# Patient Record
Sex: Male | Born: 1945
Health system: Southern US, Community
[De-identification: ages and names within clinical notes are randomized; demographics above are authoritative.]

## PROBLEM LIST (undated history)

## (undated) DIAGNOSIS — Z800 Family history of malignant neoplasm of digestive organs: Secondary | ICD-10-CM

## (undated) DIAGNOSIS — J439 Emphysema, unspecified: Secondary | ICD-10-CM

## (undated) DIAGNOSIS — C20 Malignant neoplasm of rectum: Secondary | ICD-10-CM

## (undated) DIAGNOSIS — Z803 Family history of malignant neoplasm of breast: Secondary | ICD-10-CM

## (undated) DIAGNOSIS — I214 Non-ST elevation (NSTEMI) myocardial infarction: Secondary | ICD-10-CM

## (undated) DIAGNOSIS — Z87891 Personal history of nicotine dependence: Secondary | ICD-10-CM

## (undated) DIAGNOSIS — Z8616 Personal history of COVID-19: Secondary | ICD-10-CM

## (undated) DIAGNOSIS — I2510 Atherosclerotic heart disease of native coronary artery without angina pectoris: Secondary | ICD-10-CM

## (undated) HISTORY — DX: Non-ST elevation (NSTEMI) myocardial infarction: I21.4

## (undated) HISTORY — DX: Malignant neoplasm of rectum: C20

## (undated) HISTORY — DX: Emphysema, unspecified: J43.9

## (undated) HISTORY — DX: Personal history of nicotine dependence: Z87.891

## (undated) HISTORY — DX: Family history of malignant neoplasm of breast: Z80.3

## (undated) HISTORY — DX: Personal history of COVID-19: Z86.16

## (undated) HISTORY — DX: Atherosclerotic heart disease of native coronary artery without angina pectoris: I25.10

## (undated) HISTORY — PX: CYSTECTOMY: SUR359

## (undated) HISTORY — DX: Family history of malignant neoplasm of digestive organs: Z80.0

## (undated) MED FILL — Dexamethasone Sodium Phosphate Inj 100 MG/10ML: INTRAMUSCULAR | Qty: 1 | Status: AC

---

## 2003-12-07 ENCOUNTER — Ambulatory Visit: Payer: Self-pay | Admitting: Family Medicine

## 2004-05-11 ENCOUNTER — Ambulatory Visit: Payer: Self-pay | Admitting: Internal Medicine

## 2004-08-17 ENCOUNTER — Ambulatory Visit (HOSPITAL_COMMUNITY): Admission: RE | Admit: 2004-08-17 | Discharge: 2004-08-17 | Payer: Self-pay | Admitting: Surgery

## 2004-08-17 ENCOUNTER — Ambulatory Visit (HOSPITAL_BASED_OUTPATIENT_CLINIC_OR_DEPARTMENT_OTHER): Admission: RE | Admit: 2004-08-17 | Discharge: 2004-08-17 | Payer: Self-pay | Admitting: Surgery

## 2004-08-17 HISTORY — PX: INGUINAL HERNIA REPAIR: SUR1180

## 2012-07-30 ENCOUNTER — Encounter: Payer: Self-pay | Admitting: Family Medicine

## 2012-07-30 ENCOUNTER — Ambulatory Visit (INDEPENDENT_AMBULATORY_CARE_PROVIDER_SITE_OTHER): Payer: Medicare Other | Admitting: Family Medicine

## 2012-07-30 VITALS — BP 146/98 | HR 76 | Temp 97.6°F | Ht 69.0 in | Wt 168.0 lb

## 2012-07-30 DIAGNOSIS — Z800 Family history of malignant neoplasm of digestive organs: Secondary | ICD-10-CM

## 2012-07-30 DIAGNOSIS — L989 Disorder of the skin and subcutaneous tissue, unspecified: Secondary | ICD-10-CM

## 2012-07-30 DIAGNOSIS — Z1211 Encounter for screening for malignant neoplasm of colon: Secondary | ICD-10-CM

## 2012-07-30 DIAGNOSIS — R030 Elevated blood-pressure reading, without diagnosis of hypertension: Secondary | ICD-10-CM

## 2012-07-30 NOTE — Assessment & Plan Note (Signed)
Concern for basal cell - I did recommend eval by dermatology - have placed referral today.

## 2012-07-30 NOTE — Assessment & Plan Note (Signed)
Mother with colon cancer at age 69 - pt overdue for screening.   Does not desire to have colonoscopy - I have recommended we at least start with stool kit - provided today.

## 2012-07-30 NOTE — Progress Notes (Signed)
Subjective:    Patient ID: Albert Giles, male    DOB: 07/27/1945, 67 y.o.   MRN: [id number]  HPI CC: new pt to establish  Skin spot on right forearm - present for 8-9 months, not healing, not really changing. Not tender or pruritic.  So far hasn't tried any meds other than peroxide. Skin spot in right groin.  "mild COPD" - never dyspneic.  Quit smoking 14 years ago.  No h/o elevated blood pressure.  fmhx colon cancer - mother at age 42.  Preventative: Last CPE was 14 years ago Thinks tetanus 2009  Caffeine: 2 cups coffee, 2 cups tea/day Lives with wife Occupation: Surveyor, quantity, retired Edu: HS Activity: works in yard, walking Diet: some water, fruits/vegetables daily  Medications and allergies reviewed and updated in chart.  Past histories reviewed and updated if relevant as below. There are no active problems to display for this patient.  Past Medical History  Diagnosis Date  . Emphysema     mild  . Ex-smoker    Past Surgical History  Procedure Laterality Date  . Inguinal hernia repair Right 08/17/04  . Cystectomy      on back   History  Substance Use Topics  . Smoking status: Former Smoker    Quit date: 07/07/1998  . Smokeless tobacco: Never Used  . Alcohol Use: No   Family History  Problem Relation Age of Onset  . Cancer Mother 6    colon  . Cancer Sister     breast  . CAD Neg Hx   . Stroke Neg Hx   . Diabetes Neg Hx    No Known Allergies No current outpatient prescriptions on file prior to visit.   No current facility-administered medications on file prior to visit.     Review of Systems  Constitutional: Negative for fever, chills, activity change, appetite change, fatigue and unexpected weight change.  HENT: Negative for hearing loss and neck pain.   Eyes: Negative for visual disturbance.  Respiratory: Negative for cough, chest tightness, shortness of breath and wheezing.   Cardiovascular: Negative for chest pain, palpitations and leg  swelling.  Gastrointestinal: Negative for nausea, vomiting, abdominal pain, diarrhea, constipation, blood in stool and abdominal distention.  Genitourinary: Negative for hematuria and difficulty urinating.  Musculoskeletal: Negative for myalgias and arthralgias.  Skin: Negative for rash.  Neurological: Negative for dizziness, seizures, syncope and headaches.  Hematological: Negative for adenopathy. Does not bruise/bleed easily.  Psychiatric/Behavioral: Negative for dysphoric mood. The patient is not nervous/anxious.        Objective:   Physical Exam  Nursing note and vitals reviewed. Constitutional: He is oriented to person, place, and time. He appears well-developed and well-nourished. No distress.  HENT:  Head: Normocephalic and atraumatic.  Right Ear: Hearing, tympanic membrane, external ear and ear canal normal.  Left Ear: Hearing, tympanic membrane, external ear and ear canal normal.  Nose: Nose normal.  Mouth/Throat: Oropharynx is clear and moist. No oropharyngeal exudate.  Eyes: Conjunctivae and EOM are normal. Pupils are equal, round, and reactive to light. No scleral icterus.  Neck: Normal range of motion. Neck supple. No thyromegaly present.  Cardiovascular: Normal rate, regular rhythm, normal heart sounds and intact distal pulses.   No murmur heard. Pulses:      Radial pulses are 2+ on the right side, and 2+ on the left side.  Pulmonary/Chest: Effort normal and breath sounds normal. No respiratory distress. He has no wheezes. He has no rales.  Musculoskeletal: Normal range of  motion. He exhibits no edema.  Lymphadenopathy:    He has no cervical adenopathy.  Neurological: He is alert and oriented to person, place, and time.  CN grossly intact, station and gait intact  Skin: Skin is warm and dry. No rash noted.  R groin with SK R forearm with 1.3cm pearly nodule, some telangectasia and scaling  Psychiatric: He has a normal mood and affect. His behavior is normal. Judgment  and thought content normal.       Assessment & Plan:

## 2012-07-30 NOTE — Patient Instructions (Addendum)
Let's keep an eye on blood pressure - at local pharmacy - and let me know if consistently >140/90. Pass up front for referral to dermatology (skin doctor).  If we are unable to make appointment today, we will call you at [phone] (cell). Pass by lab for stool kit. Return in 6 months fasting for blood work, and afterwads for a physical.

## 2012-07-30 NOTE — Assessment & Plan Note (Signed)
Pt states no h/o this, but elevated today. Advised to monitor closely at home (local pharmacy once a week) and return to see me if consistently elevated for further discussion/evaluation.

## 2012-08-05 ENCOUNTER — Other Ambulatory Visit: Payer: Medicare Other

## 2012-08-05 DIAGNOSIS — Z1211 Encounter for screening for malignant neoplasm of colon: Secondary | ICD-10-CM

## 2012-08-07 ENCOUNTER — Encounter: Payer: Self-pay | Admitting: *Deleted

## 2012-09-10 ENCOUNTER — Encounter: Payer: Self-pay | Admitting: Family Medicine

## 2012-09-19 ENCOUNTER — Encounter: Payer: Self-pay | Admitting: Family Medicine

## 2013-11-26 ENCOUNTER — Telehealth (HOSPITAL_COMMUNITY): Payer: Self-pay | Admitting: *Deleted

## 2013-11-26 NOTE — Telephone Encounter (Signed)
Spoke to pt & reminded him about his due AWV including Colon Ca screening--need an appt. made

## 2013-11-29 NOTE — Telephone Encounter (Signed)
Pt scheduled 12/27/13 with same day labs. Pt wanted to inform us he is switching to [facility] for 2016

## 2013-11-29 NOTE — Telephone Encounter (Signed)
Ebony Hail, Can you please call the pt and get him in for his Medicare Annual Wellness Visit before 01/13/14. Thank you, Vincente Liberty

## 2013-12-27 ENCOUNTER — Ambulatory Visit (INDEPENDENT_AMBULATORY_CARE_PROVIDER_SITE_OTHER): Payer: Medicare Other | Admitting: Family Medicine

## 2013-12-27 ENCOUNTER — Encounter: Payer: Self-pay | Admitting: Family Medicine

## 2013-12-27 VITALS — BP 128/84 | HR 84 | Temp 98.2°F | Ht 68.25 in | Wt 174.0 lb

## 2013-12-27 DIAGNOSIS — Z0000 Encounter for general adult medical examination without abnormal findings: Secondary | ICD-10-CM | POA: Insufficient documentation

## 2013-12-27 DIAGNOSIS — Z23 Encounter for immunization: Secondary | ICD-10-CM

## 2013-12-27 DIAGNOSIS — Z7189 Other specified counseling: Secondary | ICD-10-CM | POA: Insufficient documentation

## 2013-12-27 DIAGNOSIS — Z800 Family history of malignant neoplasm of digestive organs: Secondary | ICD-10-CM

## 2013-12-27 NOTE — Progress Notes (Signed)
BP 128/84 mmHg  Pulse 84  Temp(Src) 98.2 F (36.8 C) (Oral)  Ht 5' 8.25" (1.734 m)  Wt 174 lb (78.926 kg)  BMI 26.25 kg/m2   CC: medicare wellness visit  Subjective:    Patient ID: Albert Giles, male    DOB: 02-09-1945, 68 y.o.   MRN: [id number]  HPI: Albert Giles is a 68 y.o. male presenting on 12/27/2013 for Annual Exam   fmhx colon cancer - mother at age 42.  Vision screen passed Hearing screen passed No falls No mood issues/depression/anhedonia.   Preventative: Colon cancer screening - discussed, would like iFOB even though has known fmhx. Prostate cancer screening - discussed, decided not to screen.  Flu - declines Pneumonia shot - hesitantly agrees to prevnar Thinks tetanus 2009 zostavax - declines Advanced directives - doesn't have this set up. Would want wife then 2 daughters. Packet provided today.  Caffeine: 2 cups coffee, 2 cups tea/day Lives with wife Occupation: Higher education careers adviser, retired Edu: HS Activity: works in yard, walking Diet: some water, fruits/vegetables daily  Caffeine: 2 cups coffee, 2 cups tea/day Lives with wife Occupation: Higher education careers adviser, retired Edu: HS Activity: works in yard, walking Diet: some water, fruits/vegetables daily  Relevant past medical, surgical, family and social history reviewed and updated as indicated. Interim medical history since our last visit reviewed. Allergies and medications reviewed and updated.  No current outpatient prescriptions on file prior to visit.   No current facility-administered medications on file prior to visit.    Review of Systems  Constitutional: Negative for fever, chills, activity change, appetite change, fatigue and unexpected weight change.  HENT: Negative for hearing loss.   Eyes: Negative for visual disturbance.  Respiratory: Negative for cough, chest tightness, shortness of breath and wheezing.   Cardiovascular: Negative for chest pain, palpitations and leg swelling.    Gastrointestinal: Negative for nausea, vomiting, abdominal pain, diarrhea, constipation, blood in stool and abdominal distention.  Genitourinary: Negative for hematuria and difficulty urinating.  Musculoskeletal: Negative for myalgias, arthralgias and neck pain.  Skin: Negative for rash.  Neurological: Negative for dizziness, seizures, syncope and headaches.  Hematological: Negative for adenopathy. Does not bruise/bleed easily.  Psychiatric/Behavioral: Negative for dysphoric mood. The patient is not nervous/anxious.    Per HPI unless specifically indicated above     Objective:    BP 128/84 mmHg  Pulse 84  Temp(Src) 98.2 F (36.8 C) (Oral)  Ht 5' 8.25" (1.734 m)  Wt 174 lb (78.926 kg)  BMI 26.25 kg/m2  Wt Readings from Last 3 Encounters:  12/27/13 174 lb (78.926 kg)  07/30/12 168 lb (76.204 kg)    Physical Exam  Constitutional: He is oriented to person, place, and time. He appears well-developed and well-nourished. No distress.  HENT:  Head: Normocephalic and atraumatic.  Right Ear: Hearing, tympanic membrane, external ear and ear canal normal.  Left Ear: Hearing, tympanic membrane, external ear and ear canal normal.  Nose: Nose normal.  Mouth/Throat: Uvula is midline, oropharynx is clear and moist and mucous membranes are normal. No oropharyngeal exudate, posterior oropharyngeal edema or posterior oropharyngeal erythema.  Eyes: Conjunctivae and EOM are normal. Pupils are equal, round, and reactive to light. No scleral icterus.  Neck: Normal range of motion. Neck supple. Carotid bruit is not present. No thyromegaly present.  Cardiovascular: Normal rate, regular rhythm, normal heart sounds and intact distal pulses.   No murmur heard. Pulses:      Radial pulses are 2+ on the right side, and 2+ on  the left side.  Pulmonary/Chest: Effort normal and breath sounds normal. No respiratory distress. He has no wheezes. He has no rales.  Abdominal: Soft. Bowel sounds are normal. He  exhibits no distension and no mass. There is no tenderness. There is no rebound and no guarding.  Genitourinary:  Prostate - declines  Musculoskeletal: Normal range of motion. He exhibits no edema.  Lymphadenopathy:    He has no cervical adenopathy.  Neurological: He is alert and oriented to person, place, and time.  CN grossly intact, station and gait intact Recall 3/3 Calculation 5/5 serial 3s  Skin: Skin is warm and dry. No rash noted.  Psychiatric: He has a normal mood and affect. His behavior is normal. Judgment and thought content normal.  Nursing note and vitals reviewed.  Results for orders placed or performed in visit on 08/05/12  Fecal occult blood, imunochemical  Result Value Ref Range   Fecal Occult Bld Negative Negative      Assessment & Plan:   Problem List Items Addressed This Visit    Medicare annual wellness visit, initial - Primary    I have personally reviewed the Medicare Annual Wellness questionnaire and have noted 1. The patient's medical and social history 2. Their use of alcohol, tobacco or illicit drugs 3. Their current medications and supplements 4. The patient's functional ability including ADL's, fall risks, home safety risks and hearing or visual impairment. 5. Diet and physical activity 6. Evidence for depression or mood disorders The patients weight, height, BMI have been recorded in the chart.  Hearing and vision has been addressed. I have made referrals, counseling and provided education to the patient based review of the above and I have provided the pt with a written personalized care plan for preventive services. Provider list updated - see scanned questionairre. Reviewed preventative protocols and updated unless pt declined.     Health maintenance examination    Preventative protocols reviewed and updated unless pt declined. Discussed healthy diet and lifestyle.    Relevant Orders      Lipid panel      Comprehensive metabolic panel       CBC with Differential   Family history of colon cancer    Declines colonoscopy.    Advanced care planning/counseling discussion    Advanced directives - doesn't have this set up. Would want wife then 2 daughters. Packet provided today.        Follow up plan: Return in about 1 year (around 12/28/2014), or as needed, for medicare wellness.

## 2013-12-27 NOTE — Progress Notes (Signed)
Pre visit review using our clinic review tool, if applicable. No additional management support is needed unless otherwise documented below in the visit note. 

## 2013-12-27 NOTE — Assessment & Plan Note (Signed)

## 2013-12-27 NOTE — Patient Instructions (Addendum)
Pass by lab to pick up stool kit.  Blood work today.  Prevnar today.  Advanced directive packet provided today.  Good to see you today, call us with questions.

## 2013-12-27 NOTE — Assessment & Plan Note (Addendum)
Advanced directives - doesn't have this set up. Would want wife then 2 daughters. Packet provided today.

## 2013-12-27 NOTE — Addendum Note (Signed)
Addended by: Emelia Salisbury C on: 12/27/2013 05:40 PM   Modules accepted: Orders

## 2013-12-27 NOTE — Assessment & Plan Note (Signed)
Preventative protocols reviewed and updated unless pt declined. Discussed healthy diet and lifestyle.  

## 2013-12-27 NOTE — Assessment & Plan Note (Signed)
Declines colonoscopy

## 2013-12-28 LAB — COMPREHENSIVE METABOLIC PANEL
ALBUMIN: 4.1 g/dL (ref 3.5–5.2)
ALK PHOS: 111 U/L (ref 39–117)
ALT: 24 U/L (ref 0–53)
AST: 23 U/L (ref 0–37)
BILIRUBIN TOTAL: 0.5 mg/dL (ref 0.2–1.2)
BUN: 15 mg/dL (ref 6–23)
CO2: 25 mEq/L (ref 19–32)
Calcium: 9.3 mg/dL (ref 8.4–10.5)
Chloride: 106 mEq/L (ref 96–112)
Creatinine, Ser: 1.4 mg/dL (ref 0.4–1.5)
GFR: 54.53 mL/min — ABNORMAL LOW (ref >60.00)
GLUCOSE: 96 mg/dL (ref 70–99)
POTASSIUM: 4 meq/L (ref 3.5–5.1)
SODIUM: 136 meq/L (ref 135–145)
TOTAL PROTEIN: 7.2 g/dL (ref 6.0–8.3)

## 2013-12-28 LAB — CBC WITH DIFFERENTIAL/PLATELET
Basophils Absolute: 0 10*3/uL (ref 0.0–0.1)
Basophils Relative: 0.5 % (ref 0.0–3.0)
EOS ABS: 0.3 10*3/uL (ref 0.0–0.7)
Eosinophils Relative: 3.5 % (ref 0.0–5.0)
HEMATOCRIT: 44.8 % (ref 39.0–52.0)
Hemoglobin: 14.8 g/dL (ref 13.0–17.0)
LYMPHS ABS: 2.6 10*3/uL (ref 0.7–4.0)
Lymphocytes Relative: 28 % (ref 12.0–46.0)
MCHC: 33.1 g/dL (ref 30.0–36.0)
MCV: 91.3 fl (ref 78.0–100.0)
MONO ABS: 0.6 10*3/uL (ref 0.1–1.0)
Monocytes Relative: 6.6 % (ref 3.0–12.0)
NEUTROS PCT: 61.4 % (ref 43.0–77.0)
Neutro Abs: 5.7 10*3/uL (ref 1.4–7.7)
PLATELETS: 293 10*3/uL (ref 150.0–400.0)
RBC: 4.9 Mil/uL (ref 4.22–5.81)
RDW: 13.7 % (ref 11.5–15.5)
WBC: 9.3 10*3/uL (ref 4.0–10.5)

## 2013-12-28 LAB — LIPID PANEL
CHOL/HDL RATIO: 6
Cholesterol: 218 mg/dL — ABNORMAL HIGH (ref 0–200)
HDL: 36.9 mg/dL — ABNORMAL LOW (ref >39.00)
NONHDL: 181.1
Triglycerides: 271 mg/dL — ABNORMAL HIGH (ref 0.0–149.0)
VLDL: 54.2 mg/dL — AB (ref 0.0–40.0)

## 2013-12-28 LAB — LDL CHOLESTEROL, DIRECT: LDL DIRECT: 135.3 mg/dL

## 2013-12-30 ENCOUNTER — Encounter: Payer: Self-pay | Admitting: *Deleted

## 2014-06-15 DIAGNOSIS — I2510 Atherosclerotic heart disease of native coronary artery without angina pectoris: Secondary | ICD-10-CM

## 2014-06-15 HISTORY — DX: Atherosclerotic heart disease of native coronary artery without angina pectoris: I25.10

## 2014-07-13 ENCOUNTER — Encounter: Payer: Self-pay | Admitting: *Deleted

## 2014-07-13 ENCOUNTER — Telehealth: Payer: Self-pay | Admitting: Family Medicine

## 2014-07-13 ENCOUNTER — Inpatient Hospital Stay
Admission: EM | Admit: 2014-07-13 | Discharge: 2014-07-15 | DRG: 247 | Disposition: A | Discharge status: Home / Self Care | Payer: Medicare HMO | Attending: Specialist | Admitting: Specialist

## 2014-07-13 ENCOUNTER — Emergency Department: Payer: Medicare HMO

## 2014-07-13 ENCOUNTER — Inpatient Hospital Stay
Admit: 2014-07-13 | Discharge: 2014-07-13 | Disposition: A | Discharge status: Home / Self Care | Payer: Medicare HMO | Attending: Cardiology | Admitting: Cardiology

## 2014-07-13 DIAGNOSIS — D72829 Elevated white blood cell count, unspecified: Secondary | ICD-10-CM | POA: Diagnosis present

## 2014-07-13 DIAGNOSIS — Z87891 Personal history of nicotine dependence: Secondary | ICD-10-CM | POA: Diagnosis not present

## 2014-07-13 DIAGNOSIS — Z79899 Other long term (current) drug therapy: Secondary | ICD-10-CM | POA: Diagnosis not present

## 2014-07-13 DIAGNOSIS — Z7982 Long term (current) use of aspirin: Secondary | ICD-10-CM | POA: Diagnosis not present

## 2014-07-13 DIAGNOSIS — I10 Essential (primary) hypertension: Secondary | ICD-10-CM | POA: Diagnosis present

## 2014-07-13 DIAGNOSIS — I214 Non-ST elevation (NSTEMI) myocardial infarction: Principal | ICD-10-CM | POA: Diagnosis present

## 2014-07-13 DIAGNOSIS — I97618 Postprocedural hemorrhage and hematoma of a circulatory system organ or structure following other circulatory system procedure: Secondary | ICD-10-CM | POA: Diagnosis not present

## 2014-07-13 DIAGNOSIS — R001 Bradycardia, unspecified: Secondary | ICD-10-CM | POA: Diagnosis present

## 2014-07-13 DIAGNOSIS — T82817A Embolism of cardiac prosthetic devices, implants and grafts, initial encounter: Secondary | ICD-10-CM | POA: Diagnosis not present

## 2014-07-13 DIAGNOSIS — I200 Unstable angina: Secondary | ICD-10-CM | POA: Diagnosis present

## 2014-07-13 DIAGNOSIS — S301XXA Contusion of abdominal wall, initial encounter: Secondary | ICD-10-CM | POA: Diagnosis present

## 2014-07-13 DIAGNOSIS — J439 Emphysema, unspecified: Secondary | ICD-10-CM | POA: Diagnosis present

## 2014-07-13 HISTORY — DX: Non-ST elevation (NSTEMI) myocardial infarction: I21.4

## 2014-07-13 LAB — BASIC METABOLIC PANEL
ANION GAP: 8 (ref 5–15)
BUN: 12 mg/dL (ref 6–20)
CALCIUM: 8.8 mg/dL — AB (ref 8.9–10.3)
CHLORIDE: 105 mmol/L (ref 101–111)
CO2: 25 mmol/L (ref 22–32)
CREATININE: 1.46 mg/dL — AB (ref 0.61–1.24)
GFR calc non Af Amer: 47 mL/min — ABNORMAL LOW (ref >60)
GFR, EST AFRICAN AMERICAN: 54 mL/min — AB (ref >60)
Glucose, Bld: 162 mg/dL — ABNORMAL HIGH (ref 65–99)
Potassium: 3.7 mmol/L (ref 3.5–5.1)
SODIUM: 138 mmol/L (ref 135–145)

## 2014-07-13 LAB — APTT: APTT: 31 s (ref 24–36)

## 2014-07-13 LAB — CBC
HEMATOCRIT: 43.5 % (ref 40.0–52.0)
HEMOGLOBIN: 14.4 g/dL (ref 13.0–18.0)
MCH: 30.4 pg (ref 26.0–34.0)
MCHC: 33.2 g/dL (ref 32.0–36.0)
MCV: 91.8 fL (ref 80.0–100.0)
Platelets: 295 10*3/uL (ref 150–440)
RBC: 4.74 MIL/uL (ref 4.40–5.90)
RDW: 13.9 % (ref 11.5–14.5)
WBC: 12.3 10*3/uL — ABNORMAL HIGH (ref 3.8–10.6)

## 2014-07-13 LAB — PROTIME-INR
INR: 1.04
Prothrombin Time: 13.8 seconds (ref 11.4–15.0)

## 2014-07-13 LAB — TROPONIN I
Troponin I: 19.73 ng/mL — ABNORMAL HIGH (ref <0.031)
Troponin I: 25.91 ng/mL — ABNORMAL HIGH (ref <0.031)

## 2014-07-13 LAB — HEPARIN LEVEL (UNFRACTIONATED): HEPARIN UNFRACTIONATED: 0.36 [IU]/mL (ref 0.30–0.70)

## 2014-07-13 LAB — BRAIN NATRIURETIC PEPTIDE: B Natriuretic Peptide: 307 pg/mL — ABNORMAL HIGH (ref 0.0–100.0)

## 2014-07-13 MED ORDER — HEPARIN BOLUS VIA INFUSION
4000.0000 [IU] | Freq: Once | INTRAVENOUS | Status: AC
  Filled 2014-07-13: qty 4000

## 2014-07-13 MED ORDER — ACETAMINOPHEN 325 MG PO TABS: 650.0000 mg | ORAL_TABLET | Freq: Four times a day (QID) | ORAL | Status: DC | PRN

## 2014-07-13 MED ORDER — ASPIRIN 81 MG PO CHEW
CHEWABLE_TABLET | ORAL | Status: AC
  Filled 2014-07-13: qty 4

## 2014-07-13 MED ORDER — SODIUM CHLORIDE 0.9 % IJ SOLN: 3.0000 mL | Freq: Two times a day (BID) | INTRAMUSCULAR | Status: DC

## 2014-07-13 MED ORDER — HEPARIN SODIUM (PORCINE) 5000 UNIT/ML IJ SOLN
INTRAMUSCULAR | Status: AC
  Filled 2014-07-13: qty 1

## 2014-07-13 MED ORDER — HEPARIN (PORCINE) IN NACL 100-0.45 UNIT/ML-% IJ SOLN
950.0000 [IU]/h | INTRAMUSCULAR | Status: DC
  Administered 2014-07-13: 950 [IU]/h via INTRAVENOUS
  Filled 2014-07-13 (×2): qty 250

## 2014-07-13 MED ORDER — ONDANSETRON HCL 4 MG PO TABS: 4.0000 mg | ORAL_TABLET | Freq: Four times a day (QID) | ORAL | Status: DC | PRN

## 2014-07-13 MED ORDER — SODIUM CHLORIDE 0.9 % IJ SOLN: 3.0000 mL | INTRAMUSCULAR | Status: DC | PRN

## 2014-07-13 MED ORDER — NITROGLYCERIN 0.4 MG SL SUBL
0.4000 mg | SUBLINGUAL_TABLET | SUBLINGUAL | Status: DC | PRN
  Administered 2014-07-13: 0.4 mg via SUBLINGUAL

## 2014-07-13 MED ORDER — ACETAMINOPHEN 650 MG RE SUPP: 650.0000 mg | Freq: Four times a day (QID) | RECTAL | Status: DC | PRN

## 2014-07-13 MED ORDER — SODIUM CHLORIDE 0.9 % IV SOLN
Freq: Once | INTRAVENOUS | Status: AC
  Administered 2014-07-13: 16:00:00 via INTRAVENOUS

## 2014-07-13 MED ORDER — HEPARIN SODIUM (PORCINE) 5000 UNIT/ML IJ SOLN
54.2500 [IU]/kg | Freq: Once | INTRAMUSCULAR | Status: AC
  Administered 2014-07-13: 4000 [IU] via INTRAVENOUS

## 2014-07-13 MED ORDER — ASPIRIN 81 MG PO CHEW
324.0000 mg | CHEWABLE_TABLET | Freq: Once | ORAL | Status: AC
  Administered 2014-07-13: 324 mg via ORAL

## 2014-07-13 MED ORDER — ATORVASTATIN CALCIUM 20 MG PO TABS
40.0000 mg | ORAL_TABLET | Freq: Every day | ORAL | Status: DC
  Administered 2014-07-13 – 2014-07-14 (×2): 40 mg via ORAL
  Filled 2014-07-13 (×2): qty 2

## 2014-07-13 MED ORDER — SODIUM CHLORIDE 0.9 % WEIGHT BASED INFUSION
3.0000 mL/kg/h | INTRAVENOUS | Status: AC
  Administered 2014-07-14: 3 mL/kg/h via INTRAVENOUS

## 2014-07-13 MED ORDER — ATORVASTATIN CALCIUM 20 MG PO TABS: 40.0000 mg | ORAL_TABLET | Freq: Every day | ORAL | Status: DC

## 2014-07-13 MED ORDER — HEPARIN (PORCINE) IN NACL 100-0.45 UNIT/ML-% IJ SOLN
12.0000 [IU]/kg/h | Freq: Once | INTRAMUSCULAR | Status: DC
  Filled 2014-07-13: qty 250

## 2014-07-13 MED ORDER — NITROGLYCERIN 0.4 MG SL SUBL: 0.4000 mg | SUBLINGUAL_TABLET | SUBLINGUAL | Status: DC | PRN

## 2014-07-13 MED ORDER — NITROGLYCERIN 0.4 MG SL SUBL
SUBLINGUAL_TABLET | SUBLINGUAL | Status: AC
  Filled 2014-07-13: qty 1

## 2014-07-13 MED ORDER — ASPIRIN 81 MG PO CHEW
81.0000 mg | CHEWABLE_TABLET | ORAL | Status: AC
  Administered 2014-07-14: 81 mg via ORAL
  Filled 2014-07-13: qty 1

## 2014-07-13 MED ORDER — ONDANSETRON HCL 4 MG/2ML IJ SOLN: 4.0000 mg | Freq: Four times a day (QID) | INTRAMUSCULAR | Status: DC | PRN

## 2014-07-13 MED ORDER — SODIUM CHLORIDE 0.9 % IV SOLN: 250.0000 mL | INTRAVENOUS | Status: DC | PRN

## 2014-07-13 MED ORDER — SODIUM CHLORIDE 0.9 % WEIGHT BASED INFUSION
1.0000 mL/kg/h | INTRAVENOUS | Status: DC
  Administered 2014-07-14: 1 mL/kg/h via INTRAVENOUS

## 2014-07-13 MED ORDER — ASPIRIN EC 81 MG PO TBEC
81.0000 mg | DELAYED_RELEASE_TABLET | Freq: Every day | ORAL | Status: DC
  Administered 2014-07-13: 81 mg via ORAL
  Filled 2014-07-13: qty 1

## 2014-07-13 MED ORDER — METOPROLOL TARTRATE 25 MG PO TABS: 25.0000 mg | ORAL_TABLET | Freq: Two times a day (BID) | ORAL | Status: DC

## 2014-07-13 NOTE — H&P (Signed)
[facility] [place] NAME: Albert Giles    MR#:  [id number]  DATE OF BIRTH:  Feb 11, 1945  DATE OF ADMISSION:  07/13/2014  PRIMARY CARE PHYSICIAN: [name], MD   REQUESTING/REFERRING PHYSICIAN: Dr. Lenise Arena  CHIEF COMPLAINT:   Chief Complaint  Patient presents with  . Chest Pain    HISTORY OF PRESENT ILLNESS:  Albert Giles  is a 69 y.o. male with a known history of tobacco abuse who presents to the hospital complaining of chest pain/tightness that began late yesterday evening. Patient says that he was in usual state of health when developed chest tightness around bedtime yesterday. He was uncomfortable most of the night and could not get any sleep and therefore came to the ER today. Patient had routine blood work in the emergency room which showed elevated troponin of 19 with no acute EKG changes and therefore is being admitted for treatment for non-Q wave MI. Patient presently is chest pain-free and hemodynamically stable. With this chest pain tightness yesterday patient denied any shortness of breath any nausea and vomiting but admitted to some diaphoresis which has resolved now.  PAST MEDICAL HISTORY:   Past Medical History  Diagnosis Date  . Emphysema     mild  . Ex-smoker     PAST SURGICAL HISTORY:   Past Surgical History  Procedure Laterality Date  . Inguinal hernia repair Right 08/17/04  . Cystectomy      on back    SOCIAL HISTORY:   History  Substance Use Topics  . Smoking status: Former Smoker -- 1.00 packs/day for 35 years    Types: Cigarettes    Quit date: 07/07/1998  . Smokeless tobacco: Never Used  . Alcohol Use: No    FAMILY HISTORY:   Family History  Problem Relation Age of Onset  . Cancer Mother 92    colon  . Cancer Sister     breast  . CAD Neg Hx   . Stroke Neg Hx   . Diabetes Neg Hx     DRUG ALLERGIES:  No Known Allergies  REVIEW OF SYSTEMS:   Review of Systems   Constitutional: Negative for fever and weight loss.  HENT: Negative for congestion, nosebleeds and tinnitus.   Eyes: Negative for blurred vision, double vision and redness.  Respiratory: Negative for cough, hemoptysis and shortness of breath.   Cardiovascular: Positive for chest pain. Negative for orthopnea, leg swelling and PND.  Gastrointestinal: Negative for nausea, vomiting, abdominal pain, diarrhea and melena.  Genitourinary: Negative for dysuria, urgency and hematuria.  Musculoskeletal: Negative for joint pain and falls.  Skin: Negative for rash.  Neurological: Negative for dizziness, tingling, sensory change, focal weakness, seizures, weakness and headaches.  Endo/Heme/Allergies: Negative for polydipsia. Does not bruise/bleed easily.  Psychiatric/Behavioral: Negative for depression and memory loss. The patient is not nervous/anxious.     MEDICATIONS AT HOME:   Prior to Admission medications   Not on File  Patient is presently on no home medications.    VITAL SIGNS:  Blood pressure 112/89, pulse 64, resp. rate 19, height 5\' 9"  (1.753 m), weight 77.565 kg (171 lb), SpO2 100 %.  PHYSICAL EXAMINATION:  Physical Exam  GENERAL:  69 y.o.-year-old patient lying in the bed with no acute distress.  EYES: Pupils equal, round, reactive to light and accommodation. No scleral icterus. Extraocular muscles intact.  HEENT: Head atraumatic, normocephalic. Oropharynx and nasopharynx clear. No oropharyngeal erythema, moist oral mucosa  NECK:  Supple, no  jugular venous distention. No thyroid enlargement, no tenderness.  LUNGS: Normal breath sounds bilaterally, no wheezing, rales, rhonchi. No use of accessory muscles of respiration.  CARDIOVASCULAR: S1, S2 RRR. No murmurs, rubs, gallops, clicks.  ABDOMEN: Soft, nontender, nondistended. Bowel sounds present. No organomegaly or mass.  EXTREMITIES: No pedal edema, cyanosis, or clubbing. + 2 pedal & radial pulses b/l.   NEUROLOGIC: Cranial nerves  II through XII are intact. No focal Motor or sensory deficits appreciated b/l PSYCHIATRIC: The patient is alert and oriented x 3. Good affect.  SKIN: No obvious rash, lesion, or ulcer.   LABORATORY PANEL:   CBC  Recent Labs Lab 07/13/14 1456  WBC 12.3*  HGB 14.4  HCT 43.5  PLT 295   ------------------------------------------------------------------------------------------------------------------  Chemistries   Recent Labs Lab 07/13/14 1456  NA 138  K 3.7  CL 105  CO2 25  GLUCOSE 162*  BUN 12  CREATININE 1.46*  CALCIUM 8.8*   ------------------------------------------------------------------------------------------------------------------  Cardiac Enzymes  Recent Labs Lab 07/13/14 1456  TROPONINI 19.73*   ------------------------------------------------------------------------------------------------------------------  RADIOLOGY:  Dg Chest 2 View  07/13/2014   CLINICAL DATA:  Chest pain  EXAM: CHEST  2 VIEW  COMPARISON:  None.  FINDINGS: The heart size and mediastinal contours are within normal limits. Both lungs are clear. The visualized skeletal structures are unremarkable.  IMPRESSION: No active cardiopulmonary disease.   Electronically Signed   By: Rolm Baptise M.D.   On: 07/13/2014 15:23     IMPRESSION AND PLAN:   69 year old male with past medical history of tobacco abuse who presents to the hospital with chest pain/tightness and noted to have a non-Q wave MI. Next  #1 non-Q-wave MI-this is likely the cause of patient's chest pain/tightness. -Patient has ruled in with elevated troponin. We'll go ahead and admit the patient on telemetry, follow serial cardiac markers, continue aspirin, heparin drip, start low-dose beta blocker, statin. -Patient has been seen by cardiology and the plan to a cardiac catheterization tomorrow.  -We'll check two-dimensional echocardiogram. Patient is presently chest pain-free and hemodynamically stable.  #2 leukocytosis-likely  stress margination from the acute MI. -Follow white cell count in a.m.   All the records are reviewed and case discussed with ED provider. Management plans discussed with the patient, family and they are in agreement.  CODE STATUS: Full  TOTAL TIME TAKING CARE OF THIS PATIENT: 45 minutes.    Henreitta Leber M.D on 07/13/2014 at 5:14 PM  Between 7am to 6pm - Pager - [phone]  After 6pm go to [email or web address] - password [email or web address] [facility]  [facility]  Office  [phone]  CC: Primary care physician; [name], MD

## 2014-07-13 NOTE — Progress Notes (Addendum)
ANTICOAGULATION CONSULT NOTE - Initial Consult  Pharmacy Consult for Heparin Indication: chest pain/ACS  No Known Allergies  Patient Measurements: Height: 5\' 9"  (175.3 cm) Weight: 168 lb 8 oz (76.431 kg) IBW/kg (Calculated) : 70.7 Heparin Dosing Weight: 77.6 kg  Vital Signs: Temp: 97.9 F (36.6 C) (06/29 1843) Temp Source: Oral (06/29 1843) BP: 97/66 mmHg (06/29 1843) Pulse Rate: 58 (06/29 1843)  Labs:  Recent Labs  07/13/14 1456 07/13/14 1802  HGB 14.4  --   HCT 43.5  --   PLT 295  --   APTT 31  --   LABPROT 13.8  --   INR 1.04  --   CREATININE 1.46*  --   TROPONINI 19.73* 25.91*    Estimated Creatinine Clearance: 47.1 mL/min (by C-G formula based on Cr of 1.46).   Medical History: Past Medical History  Diagnosis Date  . Emphysema     mild  . Ex-smoker     Medications:  No prescriptions prior to admission    Assessment: NSTEMI  Goal of Therapy:  Heparin level 0.3-0.7 units/ml Monitor platelets by anticoagulation protocol: Yes   Plan:  Give 4000 units bolus x 1 Start heparin infusion at 950 units/hr. Will draw 1st HL 6 hrs after start of drip on 6/29 @ 23:00.   Robbins,Jason D 07/13/2014,7:29 PM    6/29 23:00 anti-xa 0.36. Recheck in 8 hours to confirm.   Sim Boast, PharmD, BCPS  07/14/2014

## 2014-07-13 NOTE — Telephone Encounter (Signed)
Non st elevation MI.Marland Kitchen Pending catheterization tomorrow. plz call fam tomorrow for update. I will follow along on EMR.

## 2014-07-13 NOTE — Telephone Encounter (Signed)
Will forward to PCP 

## 2014-07-13 NOTE — ED Notes (Signed)
Pt reports having chest pain that started last night. Pt reports he has had chest pain "on and off" for the past two years. Pt denies any other symptoms besides cheat pain

## 2014-07-13 NOTE — Consult Note (Signed)
CARDIOLOGY CONSULT NOTE  Patient ID: Albert Giles MRN: [id number] DOB/AGE: 71-Feb-1947 71 y.o.  Admit date: 07/13/2014 Referring Physician Dr. Jimmye Norman Primary Physician Dr. Marcina Millard Primary Cardiologist Dr. Ubaldo Glassing Reason for Consultation nstemi The patient is a 69 year old male with no prior cardiac history with previous tobacco abuse quitting 16 years ago who presented to the emergency room after developing midsternal chest pain that occurred last p.m. This began last night and persisted throughout the night. He described midsternal pain with radiation to his arms. He had difficulty getting comfortable. He presented to the emergency room this morning with some mild chest tightness and lightheadedness. He was noted to be relatively hypotensive. Initial EKG revealed sinus rhythm with nonspecific anterolateral ST changes. Subsequent EKG showed no ST elevation or depression. His initial serum troponin was 19. Chest x-ray showed no acute process. Serum creatinine 1.46.  ROS Review of Systems - History obtained from chart review and the patient General ROS: positive for  - fatigue and sleep disturbance Respiratory ROS: positive for - shortness of breath Cardiovascular ROS: positive for - chest pain and shortness of breath Musculoskeletal ROS: negative Neurological ROS: no TIA or stroke symptoms   Past Medical History  Diagnosis Date  . Emphysema     mild  . Ex-smoker     Family History  Problem Relation Age of Onset  . Cancer Mother 68    colon  . Cancer Sister     breast  . CAD Neg Hx   . Stroke Neg Hx   . Diabetes Neg Hx     History   Social History  . Marital Status: Married    Spouse Name: N/A  . Number of Children: N/A  . Years of Education: N/A   Occupational History  . Not on file.   Social History Main Topics  . Smoking status: Former Smoker -- 1.00 packs/day for 35 years    Types: Cigarettes    Quit date: 07/07/1998  . Smokeless tobacco: Never Used  . Alcohol  Use: No  . Drug Use: No  . Sexual Activity: Not on file   Other Topics Concern  . Not on file   Social History Narrative   Caffeine: 2 cups coffee, 2 cups tea/day   Lives with wife   Occupation: Higher education careers adviser, retired   Edu: HS   Activity: works in yard, walking   Diet: some water, fruits/vegetables daily    Past Surgical History  Procedure Laterality Date  . Inguinal hernia repair Right 08/17/04  . Cystectomy      on back      (Not in a hospital admission)  Physical Exam: Blood pressure 112/89, pulse 64, resp. rate 19, height 5\' 9"  (1.753 m), weight 77.565 kg (171 lb), SpO2 100 %.   General appearance: alert and cooperative Head: Normocephalic, without obvious abnormality, atraumatic Resp: clear to auscultation bilaterally Chest wall: no tenderness Cardio: regular rate and rhythm, S1, S2 normal, no murmur, click, rub or gallop GI: soft, non-tender; bowel sounds normal; no masses,  no organomegaly Extremities: extremities normal, atraumatic, no cyanosis or edema Pulses: 2+ and symmetric Neurologic: Grossly normal Labs:   Lab Results  Component Value Date   WBC 12.3* 07/13/2014   HGB 14.4 07/13/2014   HCT 43.5 07/13/2014   MCV 91.8 07/13/2014   PLT 295 07/13/2014    Recent Labs Lab 07/13/14 1456  NA 138  K 3.7  CL 105  CO2 25  BUN 12  CREATININE 1.46*  CALCIUM 8.8*  GLUCOSE 162*   Lab Results  Component Value Date   TROPONINI 19.73* 07/13/2014      Radiology: No acute cardiopulmonary disease EKG: Sinus rhythm with what appears to be nonspecific ST-T wave changes possible evolving anterolateral myocardial infarction  ASSESSMENT AND PLAN: Patient with non-ST elevation myocardial infarction likely occurring last p.m. Relatively hypotensive but hemodynamically stable otherwise. He is mentating well. His serum creatinine is elevated somewhat. Will hydrate and proceed with an echocardiogram tonight to evaluate LV function. Would agree with placing on IV  heparin and aspirin. Will add atorvastatin at 40 mg daily. We will defer beta blockers secondary to relative bradycardia and hypotension. Risk and benefits a left cardiac catheterization were explained. Plan to proceed left cardiac catheterization in the morning for a evolving non-ST elevation myocardial infarction. Will add ACE inhibitor and beta blocker as tolerated Signed: Teodoro Spray MD, [facility] 07/13/2014, 4:58 PM

## 2014-07-13 NOTE — Telephone Encounter (Signed)
Patient Name: Albert Giles  DOB: [birth year]    Initial Comment Caller states, husband , chest pains last night and today -- caller knows the facility name, but not the Dr's name    Nurse Assessment  Nurse: Leilani Merl, RN, Nira Conn Date/Time (Eastern Time): 07/13/2014 1:40:29 PM  Confirm and document reason for call. If symptomatic, describe symptoms. ---Caller states, husband , chest pains last night and today  Has the patient traveled out of the country within the last 30 days? ---Not Applicable  Does the patient require triage? ---Yes  Related visit to physician within the last 2 weeks? ---No  Does the PT have any chronic conditions? (i.e. diabetes, asthma, etc.) ---No     Guidelines    Guideline Title Affirmed Question Affirmed Notes  Chest Pain [1] Chest pain lasts > 5 minutes AND [2] described as crushing, pressure-like, or heavy    Final Disposition User   Call EMS 911 Now [place], Therapist, sports, [employer]

## 2014-07-13 NOTE — ED Notes (Addendum)
Pt placed on crash cart and pacer pads. MD informed of pt's bradycardic episodes as low as 50bpm.

## 2014-07-13 NOTE — ED Provider Notes (Addendum)
[facility] Emergency Department Provider Note     Time seen: ----------------------------------------- 3:07 PM on 07/13/2014 -----------------------------------------    I have reviewed the triage vital signs and the nursing notes.   HISTORY  Chief Complaint Chest Pain    HPI JOHNOTHAN Giles is a 69 y.o. male who presents ER for chest tightness since last night. Patient reports pain is mild to moderate at this time it is been persistent since last night he had sweats. Nothing made it better or worse. He has had on and off for the last couple of years. Has not told his doctor about the pain. Does not have a history of angina or cardiac workups. He states she's never had a stress test or heart catheter. States she's never had a heart attack   Past Medical History  Diagnosis Date  . Emphysema     mild  . Ex-smoker     Patient Active Problem List   Diagnosis Date Noted  . Medicare annual wellness visit, initial 12/27/2013  . Health maintenance examination 12/27/2013  . Advanced care planning/counseling discussion 12/27/2013  . Family history of colon cancer 07/30/2012    Past Surgical History  Procedure Laterality Date  . Inguinal hernia repair Right 08/17/04  . Cystectomy      on back    Allergies Review of patient's allergies indicates no known allergies.  Social History History  Substance Use Topics  . Smoking status: Former Smoker -- 1.00 packs/day for 35 years    Types: Cigarettes    Quit date: 07/07/1998  . Smokeless tobacco: Never Used  . Alcohol Use: No    Review of Systems Constitutional: Negative for fever. Eyes: Negative for visual changes. ENT: Negative for sore throat. Cardiovascular: Positive for chest pain Respiratory: Negative for shortness of breath. Gastrointestinal: Negative for abdominal pain, vomiting and diarrhea. Genitourinary: Negative for dysuria. Musculoskeletal: Negative for back pain. Skin: Positive for  sweats Neurological: Negative for headaches, focal weakness or numbness.  10-point ROS otherwise negative.  ____________________________________________   PHYSICAL EXAM:  VITAL SIGNS: ED Triage Vitals  Enc Vitals Group     BP --      Pulse --      Resp --      Temp --      Temp src --      SpO2 --      Weight 07/13/14 1450 171 lb (77.565 kg)     Height 07/13/14 1450 5\' 9"  (1.753 m)     Head Cir --      Peak Flow --      Pain Score 07/13/14 1451 6     Pain Loc --      Pain Edu? --      Excl. in [place]? --     Constitutional: Alert and oriented. Well appearing and in no distress. Eyes: Conjunctivae are normal. PERRL. Normal extraocular movements. ENT   Head: Normocephalic and atraumatic.   Nose: No congestion/rhinnorhea.   Mouth/Throat: Mucous membranes are moist.   Neck: No stridor. Hematological/Lymphatic/Immunilogical: No cervical lymphadenopathy. Cardiovascular: Normal rate, regular rhythm. Normal and symmetric distal pulses are present in all extremities. No murmurs, rubs, or gallops. Respiratory: Normal respiratory effort without tachypnea nor retractions. Breath sounds are clear and equal bilaterally. No wheezes/rales/rhonchi. Gastrointestinal: Soft and nontender. No distention. No abdominal bruits. There is no CVA tenderness. Musculoskeletal: Nontender with normal range of motion in all extremities. No joint effusions.  No lower extremity tenderness nor edema. Neurologic:  Normal speech  and language. No gross focal neurologic deficits are appreciated. Speech is normal. No gait instability. Skin:  Skin is warm, dry and intact. No rash noted. Psychiatric: Mood and affect are normal. Speech and behavior are normal. Patient exhibits appropriate insight and judgment. ____________________________________________  EKG: Interpreted by me. Normal sinus rhythm with a short PR interval, rate is 72 bpm, QRS with normal, QT intervals normal. There is suggestions of  anterior ST depression in lateral T wave inversion. These are concerning for ischemic changes  ____________________________________________  ED COURSE:  Pertinent labs & imaging results that were available during my care of the patient were reviewed by me and considered in my medical decision making (see chart for details). Patient likely with unstable angina, will give aspirin and nitroglycerin. Cardiac markers will be sent. ____________________________________________    LABS (pertinent positives/negatives)  Labs Reviewed  BASIC METABOLIC PANEL - Abnormal; Notable for the following:    Glucose, Bld 162 (*)    Creatinine, Ser 1.46 (*)    Calcium 8.8 (*)    GFR calc non Af Amer 47 (*)    GFR calc Af Amer 54 (*)    All other components within normal limits  TROPONIN I - Abnormal; Notable for the following:    Troponin I 19.73 (*)    All other components within normal limits  CBC - Abnormal; Notable for the following:    WBC 12.3 (*)    All other components within normal limits  BRAIN NATRIURETIC PEPTIDE  PROTIME-INR  APTT    RADIOLOGY Images were viewed by me  Chest x-ray FINDINGS: The heart size and mediastinal contours are within normal limits. Both lungs are clear. The visualized skeletal structures are unremarkable.  IMPRESSION: No active cardiopulmonary disease.  CRITICAL CARE Performed by: Earleen Newport   Total critical care time: 30 minutes  Critical care time was exclusive of separately billable procedures and treating other patients.  Critical care was necessary to treat or prevent imminent or life-threatening deterioration.  Critical care was time spent personally by me on the following activities: development of treatment plan with patient and/or surrogate as well as nursing, discussions with consultants, evaluation of patient's response to treatment, examination of patient, obtaining history from patient or surrogate, ordering and performing  treatments and interventions, ordering and review of laboratory studies, ordering and review of radiographic studies, pulse oximetry and re-evaluation of patient's condition.  ____________________________________________  FINAL ASSESSMENT AND PLAN  Unstable angina, non-ST elevation MI  Plan: Patient with significant symptoms as well as markedly elevated troponin as dictated above. One sublingual nitroglycerin dropped his blood pressure from 992-42 systolic. Patient states it did help his chest pain considerably to the point where was almost gone. Patient is given fluids to bring his blood pressure back up. He is in no acute distress this time. Patient needs cardiac catheterization, will discuss with cardiology on-call. Repeat EKG was sinus bradycardia with rate of 56, normal PR interval, normal. QRS with, normal QT interval. No evidence of hypertrophy or acute infarction.  Earleen Newport, MD   Earleen Newport, MD 07/13/14 [place], MD 07/13/14 [id number]

## 2014-07-13 NOTE — ED Notes (Signed)
Pt BP dropped to 60's, MD williams at bedside, fluids started at this time. Pt states chest pain dropped from 5 to 1 after taking the nitro. Pt AAOx3 at this time pressure increasing to 80/64 at this time.

## 2014-07-14 ENCOUNTER — Encounter
Admission: EM | Disposition: A | Discharge status: Home / Self Care | Payer: Self-pay | Source: Home / Self Care | Attending: Internal Medicine

## 2014-07-14 HISTORY — PX: CARDIAC CATHETERIZATION: SHX172

## 2014-07-14 LAB — BASIC METABOLIC PANEL
Anion gap: 7 (ref 5–15)
BUN: 15 mg/dL (ref 6–20)
CALCIUM: 8.1 mg/dL — AB (ref 8.9–10.3)
CO2: 24 mmol/L (ref 22–32)
Chloride: 106 mmol/L (ref 101–111)
Creatinine, Ser: 1.23 mg/dL (ref 0.61–1.24)
GFR calc Af Amer: >60 mL/min (ref >60)
GFR, EST NON AFRICAN AMERICAN: 58 mL/min — AB (ref >60)
GLUCOSE: 131 mg/dL — AB (ref 65–99)
POTASSIUM: 4.1 mmol/L (ref 3.5–5.1)
SODIUM: 137 mmol/L (ref 135–145)

## 2014-07-14 LAB — CBC
HCT: 40 % (ref 40.0–52.0)
Hemoglobin: 13 g/dL (ref 13.0–18.0)
MCH: 30 pg (ref 26.0–34.0)
MCHC: 32.5 g/dL (ref 32.0–36.0)
MCV: 92.3 fL (ref 80.0–100.0)
Platelets: 226 10*3/uL (ref 150–440)
RBC: 4.34 MIL/uL — ABNORMAL LOW (ref 4.40–5.90)
RDW: 13.6 % (ref 11.5–14.5)
WBC: 12.1 10*3/uL — AB (ref 3.8–10.6)

## 2014-07-14 LAB — LIPID PANEL
CHOLESTEROL: 170 mg/dL (ref 0–200)
HDL: 42 mg/dL (ref >40)
LDL Cholesterol: 106 mg/dL — ABNORMAL HIGH (ref 0–99)
TRIGLYCERIDES: 111 mg/dL (ref <150)
Total CHOL/HDL Ratio: 4 RATIO
VLDL: 22 mg/dL (ref 0–40)

## 2014-07-14 LAB — HEPARIN LEVEL (UNFRACTIONATED): HEPARIN UNFRACTIONATED: 0.56 [IU]/mL (ref 0.30–0.70)

## 2014-07-14 LAB — TROPONIN I
Troponin I: 48.65 ng/mL — ABNORMAL HIGH (ref <0.031)
Troponin I: 51.52 ng/mL — ABNORMAL HIGH (ref <0.031)

## 2014-07-14 LAB — CREATININE, SERUM
Creatinine, Ser: 1.12 mg/dL (ref 0.61–1.24)
GFR calc Af Amer: >60 mL/min (ref >60)
GFR calc non Af Amer: >60 mL/min (ref >60)

## 2014-07-14 LAB — HEMOGLOBIN A1C: Hgb A1c MFr Bld: 5.5 % (ref 4.0–6.0)

## 2014-07-14 SURGERY — LEFT HEART CATH AND CORONARY ANGIOGRAPHY
Anesthesia: Moderate Sedation

## 2014-07-14 MED ORDER — MIDAZOLAM HCL 2 MG/2ML IJ SOLN
INTRAMUSCULAR | Status: AC
  Filled 2014-07-14: qty 2

## 2014-07-14 MED ORDER — TIROFIBAN HCL IV 12.5 MG/250 ML
INTRAVENOUS | Status: AC
  Filled 2014-07-14: qty 250

## 2014-07-14 MED ORDER — ASPIRIN 81 MG PO CHEW
81.0000 mg | CHEWABLE_TABLET | Freq: Every day | ORAL | Status: DC
  Administered 2014-07-15: 81 mg via ORAL
  Filled 2014-07-14: qty 1

## 2014-07-14 MED ORDER — MIDAZOLAM HCL 2 MG/2ML IJ SOLN
INTRAMUSCULAR | Status: DC | PRN
  Administered 2014-07-14 (×2): 1 mg via INTRAVENOUS

## 2014-07-14 MED ORDER — ONDANSETRON HCL 4 MG/2ML IJ SOLN: 4.0000 mg | Freq: Four times a day (QID) | INTRAMUSCULAR | Status: DC | PRN

## 2014-07-14 MED ORDER — IOHEXOL 300 MG/ML  SOLN
INTRAMUSCULAR | Status: DC | PRN
  Administered 2014-07-14: 310 mL via INTRA_ARTERIAL

## 2014-07-14 MED ORDER — SODIUM CHLORIDE 0.9 % IJ SOLN: 3.0000 mL | INTRAMUSCULAR | Status: DC | PRN

## 2014-07-14 MED ORDER — ASPIRIN 81 MG PO CHEW
CHEWABLE_TABLET | ORAL | Status: DC | PRN
  Administered 2014-07-14: 324 mg via ORAL

## 2014-07-14 MED ORDER — TIROFIBAN HCL IV 12.5 MG/250 ML: 0.0750 ug/kg/min | INTRAVENOUS | Status: DC

## 2014-07-14 MED ORDER — HEPARIN SODIUM (PORCINE) 1000 UNIT/ML IJ SOLN
INTRAMUSCULAR | Status: AC
  Filled 2014-07-14: qty 1

## 2014-07-14 MED ORDER — ACETAMINOPHEN 325 MG PO TABS: 650.0000 mg | ORAL_TABLET | ORAL | Status: DC | PRN

## 2014-07-14 MED ORDER — DOPAMINE-DEXTROSE 3.2-5 MG/ML-% IV SOLN
INTRAVENOUS | Status: AC
  Filled 2014-07-14: qty 250

## 2014-07-14 MED ORDER — TICAGRELOR 90 MG PO TABS
ORAL_TABLET | ORAL | Status: DC | PRN
  Administered 2014-07-14: 180 mg via ORAL

## 2014-07-14 MED ORDER — TICAGRELOR 90 MG PO TABS
90.0000 mg | ORAL_TABLET | Freq: Two times a day (BID) | ORAL | Status: DC
  Administered 2014-07-14: 90 mg via ORAL
  Filled 2014-07-14: qty 1

## 2014-07-14 MED ORDER — TICAGRELOR 90 MG PO TABS
ORAL_TABLET | ORAL | Status: AC
  Filled 2014-07-14: qty 2

## 2014-07-14 MED ORDER — OXYCODONE-ACETAMINOPHEN 5-325 MG PO TABS: 1.0000 | ORAL_TABLET | ORAL | Status: DC | PRN

## 2014-07-14 MED ORDER — ASPIRIN 81 MG PO CHEW
CHEWABLE_TABLET | ORAL | Status: AC
  Filled 2014-07-14: qty 4

## 2014-07-14 MED ORDER — TIROFIBAN HCL IV 5 MG/100ML
INTRAVENOUS | Status: DC | PRN
  Administered 2014-07-14: 0.075 ug/kg/min via INTRAVENOUS

## 2014-07-14 MED ORDER — HEPARIN SODIUM (PORCINE) 1000 UNIT/ML IJ SOLN
INTRAMUSCULAR | Status: DC | PRN
  Administered 2014-07-14: 3000 [IU] via INTRAVENOUS

## 2014-07-14 MED ORDER — FENTANYL CITRATE (PF) 100 MCG/2ML IJ SOLN
INTRAMUSCULAR | Status: AC
  Filled 2014-07-14: qty 2

## 2014-07-14 MED ORDER — HEPARIN (PORCINE) IN NACL 2-0.9 UNIT/ML-% IJ SOLN
INTRAMUSCULAR | Status: AC
  Filled 2014-07-14: qty 1000

## 2014-07-14 MED ORDER — SODIUM CHLORIDE 0.9 % WEIGHT BASED INFUSION: 3.0000 mL/kg/h | INTRAVENOUS | Status: AC

## 2014-07-14 MED ORDER — SODIUM CHLORIDE 0.9 % IJ SOLN
3.0000 mL | Freq: Two times a day (BID) | INTRAMUSCULAR | Status: DC
  Administered 2014-07-14 – 2014-07-15 (×2): 3 mL via INTRAVENOUS

## 2014-07-14 MED ORDER — TIROFIBAN (AGGRASTAT) BOLUS VIA INFUSION
INTRAVENOUS | Status: DC | PRN
  Administered 2014-07-14: 1910 ug via INTRAVENOUS

## 2014-07-14 MED ORDER — SODIUM CHLORIDE 0.9 % IV SOLN: 250.0000 mL | INTRAVENOUS | Status: DC | PRN

## 2014-07-14 MED ORDER — FENTANYL CITRATE (PF) 100 MCG/2ML IJ SOLN
INTRAMUSCULAR | Status: DC | PRN
  Administered 2014-07-14: 25 ug via INTRAVENOUS

## 2014-07-14 MED ORDER — TIROFIBAN HCL IV 5 MG/100ML
INTRAVENOUS | Status: AC
  Filled 2014-07-14: qty 100

## 2014-07-14 SURGICAL SUPPLY — 16 items
CATH INFINITI 5FR ANG PIGTAIL (CATHETERS) ×2 IMPLANT
CATH INFINITI 5FR JL4 (CATHETERS) ×2 IMPLANT
CATH INFINITI JR4 5F (CATHETERS) ×2 IMPLANT
CATH PRIORITY ONE AC 6F (CATHETERS) ×2 IMPLANT
CATH VISTA GUIDE 6FR XB3.5 SH (CATHETERS) ×2 IMPLANT
DEVICE CLOSURE MYNXGRIP 6/7F (Vascular Products) ×2 IMPLANT
DEVICE INFLAT 30 PLUS (MISCELLANEOUS) ×2 IMPLANT
KIT MANI 3VAL PERCEP (MISCELLANEOUS) ×2 IMPLANT
NEEDLE PERC 18GX7CM (NEEDLE) ×2 IMPLANT
PACK CARDIAC CATH (CUSTOM PROCEDURE TRAY) ×2 IMPLANT
SHEATH AVANTI 5FR X 11CM (SHEATH) ×2 IMPLANT
SHEATH AVANTI 6FR X 11CM (SHEATH) ×2 IMPLANT
STENT XIENCE ALPINE RX 3.0X23 (Permanent Stent) ×2 IMPLANT
STENT XIENCE ALPINE RX 3.0X28 (Permanent Stent) IMPLANT
WIRE EMERALD 3MM-J .035X150CM (WIRE) ×2 IMPLANT
WIRE G HI TQ BMW 190 (WIRE) ×2 IMPLANT

## 2014-07-14 NOTE — Telephone Encounter (Signed)
Message left for patient's wife to return my call.  

## 2014-07-14 NOTE — CV Procedure (Signed)
   Cardiac Catheterization Procedure Note  Name: Albert Giles MRN: [id number] DOB: 08-Jul-1945  Procedure: Left Heart Cath, Selective Coronary Angiography, LV angiography  Indication: nstemi   Medications:  Sedation:  1 mg IV Versed, 0 mcg IV Fentanyl  Contrast:  110 Omnipaque  Procedural details: The right groin was prepped, draped, and anesthetized with 1% lidocaine. Using modified Seldinger technique, a 5 French sheath was introduced into the right femoral artery. Standard Judkins catheters were used for coronary angiography and left ventriculography. Catheter exchanges were performed over a guidewire. There were no immediate procedural complications. The patient was transferred to the post catheterization recovery area for further monitoring.   Procedural Findings:  Hemodynamics: AO:  94/51  mmHg LV:  103   mmHg LVEDP: 15  mmHg  Coronary angiography: Coronary dominance: right   Left Main:  normal  Left Anterior Descending (LAD):  Insignificant disease  1st diagonal (D1):    2nd diagonal (D2):    3rd diagonal (D3):    Circumflex (LCx):  99% proximal stenosis into om 1 which is a large vessel with heavy clot burden and timi2 flow  1st obtuse marginal:  99% stenosis with clot  2nd obtuse marginal:    3rd obtuse marginal:    AV groove continuation segment:   Ramus Intermedius:  Moderate sized vessel with no significant disease  Right Coronary Artery: no significant disease  Posterior descending artery:   Posterior AV segment:  Posterolateral branchs:   Left ventriculography: Left ventricular systolic function is moderately decreased, LVEF is estimated at 40 %, there is tivial significant mitral regurgitation   Final Conclusions:  99% stenosis of proximal circumflex/om1 with timi 2 flow. WIll need consideration for clot extraction and pci of circumflex.   Recommendations:  Referral to interventional cardiology for considration for pci of circumflex.    Teodoro Spray MD, [facility] 07/14/2014, 9:53 AM

## 2014-07-14 NOTE — Progress Notes (Signed)
aggrastat infusing RPIV @13 .8 ml/hr post pci

## 2014-07-14 NOTE — Progress Notes (Signed)
Called report to ICU nurse, pt gcs 15, right groin with femstop intact, no hematoma noted, ns 100 ml/hr aggrastat drip 13.8 ml/hr RPIV.

## 2014-07-14 NOTE — Progress Notes (Signed)
This RN took over care of patient at 1500, patient in no apparent distress.  Groin site with hematoma did not worsen from previously marked hematoma, femstop removed at 1800 with per order.  Patient head of bed elevated, instructed to not lift affected leg up or get up at this time.  Pulses remains at baseline.   Patient denies pain/discomfort.  Currently resting in no apparent distress.  See SCM for further details.

## 2014-07-14 NOTE — Progress Notes (Signed)
Albert Giles at [facility] NAME: Albert Giles    MR#:  [id number]  DATE OF BIRTH:  03-24-1945  SUBJECTIVE:  CHIEF COMPLAINT:   Chief Complaint  Patient presents with  . Chest Pain   presented with chest pains, tightness, on arrival to emergency room, patient was noted to have troponin elevation of 19.7 with peak of 51.5. EKG was unremarkable. Patient was admitted with diagnosis of non-Q-wave MI sited on aspirin, heparin drip, beta blockers as well as statins. Patient underwent cardiac catheterization today by Dr. Ubaldo Glassing and underwent stenting to the left circumflex to 99% stenosis. Patient feels good today. Denies any chest pains. Of note, he developed a small hematoma in cardiac catheterization site in Right groin.   Review of Systems  Constitutional: Negative for fever, chills and weight loss.  HENT: Negative for congestion.   Eyes: Negative for blurred vision and double vision.  Respiratory: Negative for cough, sputum production, shortness of breath and wheezing.   Cardiovascular: Negative for chest pain, palpitations, orthopnea, leg swelling and PND.  Gastrointestinal: Negative for nausea, vomiting, abdominal pain, diarrhea, constipation and blood in stool.  Genitourinary: Negative for dysuria, urgency, frequency and hematuria.  Musculoskeletal: Negative for falls.  Neurological: Negative for dizziness, tremors, focal weakness and headaches.  Endo/Heme/Allergies: Does not bruise/bleed easily.  Psychiatric/Behavioral: Negative for depression. The patient does not have insomnia.     VITAL SIGNS: Blood pressure 103/62, pulse 70, temperature 97.9 F (36.6 C), temperature source Oral, resp. rate 21, height 5\' 9"  (1.753 m), weight 76.431 kg (168 lb 8 oz), SpO2 97 %.  PHYSICAL EXAMINATION:   GENERAL:  69 y.o.-year-old patient lying in the bed with no acute distress.  EYES: Pupils equal, round, reactive to light and accommodation. No scleral  icterus. Extraocular muscles intact.  HEENT: Head atraumatic, normocephalic. Oropharynx and nasopharynx clear.  NECK:  Supple, no jugular venous distention. No thyroid enlargement, no tenderness.  LUNGS: Normal breath sounds bilaterally, no wheezing, rales,rhonchi or crepitation. No use of accessory muscles of respiration.  CARDIOVASCULAR: S1, S2 normal. No murmurs, rubs, or gallops.  ABDOMEN: Soft, nontender, nondistended. Bowel sounds present. No organomegaly or mass.  EXTREMITIES: No pedal edema, cyanosis, or clubbing.  NEUROLOGIC: Cranial nerves II through XII are intact. Muscle strength 5/5 in all extremities. Sensation intact. Gait not checked.  PSYCHIATRIC: The patient is alert and oriented x 3.  SKIN: No obvious rash, lesion, or ulcer.  Right groin area has small hematoma with compression device. Dorsalis pedis pulses are diminished bilaterally  ORDERS/RESULTS REVIEWED:   CBC  Recent Labs Lab 07/13/14 1456 07/14/14 0647  WBC 12.3* 12.1*  HGB 14.4 13.0  HCT 43.5 40.0  PLT 295 226  MCV 91.8 92.3  MCH 30.4 30.0  MCHC 33.2 32.5  RDW 13.9 13.6   ------------------------------------------------------------------------------------------------------------------  Chemistries   Recent Labs Lab 07/13/14 1456 07/14/14 0647  NA 138 137  K 3.7 4.1  CL 105 106  CO2 25 24  GLUCOSE 162* 131*  BUN 12 15  CREATININE 1.46* 1.23  CALCIUM 8.8* 8.1*   ------------------------------------------------------------------------------------------------------------------ estimated creatinine clearance is 55.9 mL/min (by C-G formula based on Cr of 1.23). ------------------------------------------------------------------------------------------------------------------ No results for input(s): TSH, T4TOTAL, T3FREE, THYROIDAB in the last 72 hours.  Invalid input(s): FREET3  Cardiac Enzymes  Recent Labs Lab 07/13/14 1802 07/13/14 2313 07/14/14 0220  TROPONINI 25.91* 48.65* 51.52*    ------------------------------------------------------------------------------------------------------------------ Invalid input(s): POCBNP ---------------------------------------------------------------------------------------------------------------  RADIOLOGY: Dg Chest 2 View  07/13/2014   CLINICAL  DATA:  Chest pain  EXAM: CHEST  2 VIEW  COMPARISON:  None.  FINDINGS: The heart size and mediastinal contours are within normal limits. Both lungs are clear. The visualized skeletal structures are unremarkable.  IMPRESSION: No active cardiopulmonary disease.   Electronically Signed   By: Rolm Baptise M.D.   On: 07/13/2014 15:23    EKG:  Orders placed or performed during the hospital encounter of 07/13/14  . EKG 12-Lead  . EKG 12-Lead  . EKG 12-Lead immediately post procedure  . EKG 12-Lead  . EKG 12-Lead  . EKG 12-Lead  . EKG 12-Lead  . EKG 12-Lead immediately post procedure    ASSESSMENT AND PLAN:  Active Problems:   NSTEMI (non-ST elevated myocardial infarction)   Non-STEMI (non-ST elevated myocardial infarction) 1. Non-Q-wave MI, status post cardiac catheterization 30th of June 2016 by Dr. Ubaldo Glassing, status post stent to left circumflex. Cardiology initiated Aggrastat. Patient will be going to intensive care unit for 24 hour Aggrastat infusion, he did aspirin, heparin infusion, Lopressor, Lipitor, nitroglycerin as needed. Vital signs are stable 2. Cardiomyopathy, ischemic, ejection fraction of 40%. The patient may benefit from ACE inhibitor depending on patient's blood pressure tolerance 3. Renal insufficiency, resolved with IV fluids, follow after cardiac catheterization 4. Hyperglycemia. Get hemoglobin A1c 5. Leukocytosis, possibly stress related. Follow along     Management plans discussed with the patient, family and they are in agreement.   DRUG ALLERGIES: No Known Allergies  CODE STATUS:     Code Status Orders        Start     Ordered   07/14/14 1230  Full code    Continuous     07/14/14 1229      TOTAL TIME TAKING CARE OF THIS PATIENT: 35 minutes.    Theodoro Grist M.D on 07/14/2014 at 12:48 PM  Between 7am to 6pm - Pager - [phone]  After 6pm go to [email or web address] - password [email or web address] [facility]  [facility]  Office  [phone]  CC: Primary care physician; [name], MD

## 2014-07-14 NOTE — Progress Notes (Signed)
Report called to [facility] in CCU.

## 2014-07-14 NOTE — Progress Notes (Signed)
ANTICOAGULATION CONSULT NOTE - Follow Up Consult  Pharmacy Consult for Heparin Indication: chest pain/ACS  No Known Allergies  Patient Measurements: Height: 5\' 9"  (175.3 cm) Weight: 168 lb 8 oz (76.431 kg) IBW/kg (Calculated) : 70.7 Heparin Dosing Weight: 77.6 kg  Vital Signs: Temp: 97.9 F (36.6 C) (06/30 0529) Temp Source: Oral (06/29 2024) BP: 94/57 mmHg (06/30 0529) Pulse Rate: 61 (06/30 0529)  Labs:  Recent Labs  07/13/14 1456 07/13/14 1802 07/13/14 2313 07/14/14 0220 07/14/14 0647  HGB 14.4  --   --   --  13.0  HCT 43.5  --   --   --  40.0  PLT 295  --   --   --  226  APTT 31  --   --   --   --   LABPROT 13.8  --   --   --   --   INR 1.04  --   --   --   --   HEPARINUNFRC  --   --  0.36  --  0.56  CREATININE 1.46*  --   --   --  1.23  TROPONINI 19.73* 25.91* 48.65* 51.52*  --     Estimated Creatinine Clearance: 55.9 mL/min (by C-G formula based on Cr of 1.23).   Medical History: Past Medical History  Diagnosis Date  . Emphysema     mild  . Ex-smoker     Medications:  No prescriptions prior to admission    Assessment: Patient is a 69 yo male admitted for NSTEMI.  Cardiac cath for today.  Pharmacy consulted to initiate and titrate heparin drip.  Heparin drip currently infusing at rate of 950 units/hr.  Plts: 226, Hgb: 13  Anti-Xa level at 0647=0.54  Goal of Therapy:  Heparin level 0.3-0.7 units/ml Monitor platelets by anticoagulation protocol: Yes   Plan:  Continue current rate of 950 units/hr. Patient had two heparin levels within goal range.  Will order a heparin level and CBC with AM labs.   Murrell Converse, PharmD Clinical Pharmacist 07/14/2014

## 2014-07-14 NOTE — H&P (Signed)
Pt arrived from 2A with heparin drip @ 9.6 ml/hr, pt denies cp, this rn stopped drip for cardiac cath procedure

## 2014-07-15 ENCOUNTER — Encounter: Payer: Self-pay | Admitting: Cardiology

## 2014-07-15 LAB — CBC
HCT: 36.7 % — ABNORMAL LOW (ref 40.0–52.0)
HEMOGLOBIN: 12.4 g/dL — AB (ref 13.0–18.0)
MCH: 30.9 pg (ref 26.0–34.0)
MCHC: 33.7 g/dL (ref 32.0–36.0)
MCV: 91.7 fL (ref 80.0–100.0)
PLATELETS: 221 10*3/uL (ref 150–440)
RBC: 4 MIL/uL — AB (ref 4.40–5.90)
RDW: 13.9 % (ref 11.5–14.5)
WBC: 15.6 10*3/uL — ABNORMAL HIGH (ref 3.8–10.6)

## 2014-07-15 MED ORDER — ASPIRIN EC 81 MG PO TBEC
81.0000 mg | DELAYED_RELEASE_TABLET | Freq: Every day | ORAL | Status: DC
Start: 2014-07-15 — End: 2022-09-23

## 2014-07-15 MED ORDER — CLOPIDOGREL BISULFATE 75 MG PO TABS: 75.0000 mg | ORAL_TABLET | Freq: Every day | ORAL | Status: DC

## 2014-07-15 MED ORDER — CLOPIDOGREL BISULFATE 75 MG PO TABS
75.0000 mg | ORAL_TABLET | Freq: Every day | ORAL | Status: DC
  Administered 2014-07-15: 75 mg via ORAL
  Filled 2014-07-15: qty 1

## 2014-07-15 MED ORDER — ATORVASTATIN CALCIUM 40 MG PO TABS: 40.0000 mg | ORAL_TABLET | Freq: Every day | ORAL | Status: DC

## 2014-07-15 MED ORDER — METOPROLOL TARTRATE 25 MG PO TABS: 25.0000 mg | ORAL_TABLET | Freq: Two times a day (BID) | ORAL | Status: DC

## 2014-07-15 NOTE — Progress Notes (Signed)
[place]  SUBJECTIVE: No chest pain. Able to ambulate in icu with no problems. Moderate ecchymosis at cath site. No bruit. Distal pulses 2+   Filed Vitals:   07/15/14 0400 07/15/14 0500 07/15/14 0600 07/15/14 0700  BP: 110/63 98/75 93/58  100/72  Pulse: 78 90 93 90  Temp:   98 F (36.7 C) 97.9 F (36.6 C)  TempSrc:   Oral Oral  Resp: 33 18 24 25   Height:      Weight:      SpO2: 93% 92% 95% 95%    Intake/Output Summary (Last 24 hours) at 07/15/14 0754 Last data filed at 07/15/14 0530  Gross per 24 hour  Intake 260.74 ml  Output   1000 ml  Net -739.26 ml    LABS: Basic Metabolic Panel:  Recent Labs  07/13/14 1456 07/14/14 0647 07/14/14 1822  NA 138 137  --   K 3.7 4.1  --   CL 105 106  --   CO2 25 24  --   GLUCOSE 162* 131*  --   BUN 12 15  --   CREATININE 1.46* 1.23 1.12  CALCIUM 8.8* 8.1*  --    Liver Function Tests: No results for input(s): AST, ALT, ALKPHOS, BILITOT, PROT, ALBUMIN in the last 72 hours. No results for input(s): LIPASE, AMYLASE in the last 72 hours. CBC:  Recent Labs  07/14/14 0647 07/15/14 0519  WBC 12.1* 15.6*  HGB 13.0 12.4*  HCT 40.0 36.7*  MCV 92.3 91.7  PLT 226 221   Cardiac Enzymes:  Recent Labs  07/13/14 1802 07/13/14 2313 07/14/14 0220  TROPONINI 25.91* 48.65* 51.52*   BNP: Invalid input(s): POCBNP D-Dimer: No results for input(s): DDIMER in the last 72 hours. Hemoglobin A1C:  Recent Labs  07/14/14 0647  HGBA1C 5.5   Fasting Lipid Panel:  Recent Labs  07/14/14 0647  CHOL 170  HDL 42  LDLCALC 106*  TRIG 111  CHOLHDL 4.0   Thyroid Function Tests: No results for input(s): TSH, T4TOTAL, T3FREE, THYROIDAB in the last 72 hours.  Invalid input(s): FREET3 Anemia Panel: No results for input(s): VITAMINB12, FOLATE, FERRITIN, TIBC, IRON, RETICCTPCT in the last 72 hours.   Physical Exam: Blood pressure 100/72, pulse 90, temperature 97.9 F (36.6 C), temperature  source Oral, resp. rate 25, height 5\' 9"  (1.753 m), weight 76.431 kg (168 lb 8 oz), SpO2 95 %.    General appearance: alert and cooperative Neck: no adenopathy, no carotid bruit, no JVD, supple, symmetrical, trachea midline and thyroid not enlarged, symmetric, no tenderness/mass/nodules Resp: clear to auscultation bilaterally Chest wall: no tenderness Cardio: regular rate and rhythm, S1, S2 normal, no murmur, click, rub or gallop GI: soft, non-tender; bowel sounds normal; no masses,  no organomegaly Extremities: extremities normal, atraumatic, no cyanosis or edema Pulses: 2+ and symmetric Incision/Wound:  TELEMETRY: Reviewed telemetry pt in NSR:  ASSESSMENT AND PLAN:  Active Problems:   NSTEMI (non-ST elevated myocardial infarction)s/p pci of proximal circumflex with des. Aggrestat stopped early this am due to cath site bruising. Distal pulses intact. Will switch to plavix and continue metoprolol, atorvastatin and asa. Transfer to telemetry and ambulate. Plan discharge this afternoon. WIll watch groin hematoma.      Teodoro Spray., MD, [facility] 07/15/2014 7:54 AM

## 2014-07-15 NOTE — Care Management (Signed)
Important Message  Patient Details  Name: Albert Giles MRN: [id number] Date of Birth: [birth year]   Medicare Important Message Given:  Yes-second notification given    Juliann Pulse A Allmond 07/15/2014, 10:28 AM

## 2014-07-15 NOTE — Plan of Care (Signed)
Problem: Phase I Progression Outcomes Goal: Vascular site scale level 0 - I Vascular Site Scale Level 0: No bruising/bleeding/hematoma Level I (Mild): Bruising/Ecchymosis, minimal bleeding/ooozing, palpable hematoma < 3 cm Level II (Moderate): Bleeding not affecting hemodynamic parameters, pseudoaneurysm, palpable hematoma > 3 cm Level III (Severe) Bleeding which affects hemodynamic parameters or retroperitoneal hemorrhage  Outcome: Progressing Level 1

## 2014-07-15 NOTE — Discharge Summary (Addendum)
[place] [facility] [place] NAME: Albert Giles    MR#:  [id number]  DATE OF BIRTH:  [birth year]  DATE OF ADMISSION:  07/13/2014 ADMITTING PHYSICIAN: Teodoro Spray, MD  DATE OF DISCHARGE: 07/15/2014  PRIMARY CARE PHYSICIAN: [name], MD    ADMISSION DIAGNOSIS:  Unstable angina [I20.0] NSTEMI (non-ST elevated myocardial infarction) [I21.4]  DISCHARGE DIAGNOSIS:  Active Problems:   NSTEMI (non-ST elevated myocardial infarction)   Non-STEMI (non-ST elevated myocardial infarction)   SECONDARY DIAGNOSIS:   Past Medical History  Diagnosis Date  . Emphysema     mild  . Ex-smoker     HOSPITAL COURSE:   69 year old male with no significant past medical history presented to the hospital with chest pain and noted to have elevated troponin consistent with a non-ST elevation MI.  #1 non-ST elevation MI-this was likely diagnosis and the patient's chest pain typical for angina along with elevated troponin of 19 on admission. -Patient was started on IV heparin, aspirin, beta blocker, statin. -Cardiology consult was obtained and patient underwent cardiac catheterization the day after showing 99% stenosis of the proximal left circumflex. Patient underwent angioplasty. Post procedure patient has had no chest pain and is presently hemodynamically stable. -Patient is being discharged on aspirin, Plavix, metoprolol, statin with close follow-up with cardiology as an outpatient. Patient did have mild LV dysfunction with EF of 40% which needs to be repeated as an outpatient.  #2 right groin bruise-this is likely secondary to the recent cardiac catheterization and anticoagulants he received. There is no evidence of acute hematoma. His hemoglobin is stable.   #3 hypertension-patient will continue low-dose metoprolol.  DISCHARGE CONDITIONS:   Stable  CONSULTS OBTAINED:  Treatment Team:  Teodoro Spray, MD  DRUG ALLERGIES:  No Known  Allergies  DISCHARGE MEDICATIONS:   Current Discharge Medication List    START taking these medications   Details  aspirin EC 81 MG tablet Take 1 tablet (81 mg total) by mouth daily. Qty: 60 tablet, Refills: 1    atorvastatin (LIPITOR) 40 MG tablet Take 1 tablet (40 mg total) by mouth daily at 6 PM. Qty: 60 tablet, Refills: 1    clopidogrel (PLAVIX) 75 MG tablet Take 1 tablet (75 mg total) by mouth daily. Qty: 60 tablet, Refills: 1    metoprolol tartrate (LOPRESSOR) 25 MG tablet Take 1 tablet (25 mg total) by mouth 2 (two) times daily. Qty: 60 tablet, Refills: 1         DISCHARGE INSTRUCTIONS:   DIET:  Cardiac diet  DISCHARGE CONDITION:  Good  ACTIVITY:  Activity as tolerated  OXYGEN:  Home Oxygen: No.   Oxygen Delivery: room air  DISCHARGE LOCATION:  home   If you experience worsening of your admission symptoms, develop shortness of breath, life threatening emergency, suicidal or homicidal thoughts you must seek medical attention immediately by calling 911 or calling your MD immediately  if symptoms less severe.  You Must read complete instructions/literature along with all the possible adverse reactions/side effects for all the Medicines you take and that have been prescribed to you. Take any new Medicines after you have completely understood and accpet all the possible adverse reactions/side effects.   Please note  You were cared for by a hospitalist during your hospital stay. If you have any questions about your discharge medications or the care you received while you were in the hospital after you are discharged, you can call the unit and asked to speak with  the hospitalist on call if the hospitalist that took care of you is not available. Once you are discharged, your primary care physician will handle any further medical issues. Please note that NO REFILLS for any discharge medications will be authorized once you are discharged, as it is imperative that you  return to your primary care physician (or establish a relationship with a primary care physician if you do not have one) for your aftercare needs so that they can reassess your need for medications and monitor your lab values.     Today   Pt. Has no chest pain, N/V and is clinically asymptomatic.   VITAL SIGNS:  Blood pressure 118/76, pulse 101, temperature 97.9 F (36.6 C), temperature source Oral, resp. rate 17, height 5\' 9"  (1.753 m), weight 76.431 kg (168 lb 8 oz), SpO2 99 %.  I/O:   Intake/Output Summary (Last 24 hours) at 07/15/14 1406 Last data filed at 07/15/14 1300  Gross per 24 hour  Intake 380.74 ml  Output   1025 ml  Net -644.26 ml    PHYSICAL EXAMINATION:  GENERAL:  69 y.o.-year-old patient lying in the bed with no acute distress.  EYES: Pupils equal, round, reactive to light and accommodation. No scleral icterus. Extraocular muscles intact.  HEENT: Head atraumatic, normocephalic. Oropharynx and nasopharynx clear.  NECK:  Supple, no jugular venous distention. No thyroid enlargement, no tenderness.  LUNGS: Normal breath sounds bilaterally, no wheezing, rales,rhonchi. No use of accessory muscles of respiration.  CARDIOVASCULAR: S1, S2 normal. No murmurs, rubs, or gallops.  ABDOMEN: Soft, non-tender, non-distended. Bowel sounds present. No organomegaly or mass.  EXTREMITIES: No pedal edema, cyanosis, or clubbing. Bruising in the right groin area but no fluctuance or hematoma formation.   NEUROLOGIC: Cranial nerves II through XII are intact. No focal motor or sensory defecits b/l.  PSYCHIATRIC: The patient is alert and oriented x 3. Good affect.  SKIN: No obvious rash, lesion, or ulcer.   DATA REVIEW:   CBC  Recent Labs Lab 07/15/14 0519  WBC 15.6*  HGB 12.4*  HCT 36.7*  PLT 221    Chemistries   Recent Labs Lab 07/14/14 0647 07/14/14 1822  NA 137  --   K 4.1  --   CL 106  --   CO2 24  --   GLUCOSE 131*  --   BUN 15  --   CREATININE 1.23 1.12   CALCIUM 8.1*  --     Cardiac Enzymes  Recent Labs Lab 07/14/14 0220  TROPONINI 51.52*    Microbiology Results  Results for orders placed or performed in visit on 08/05/12  Fecal occult blood, imunochemical     Status: None   Collection Time: 08/05/12  1:07 PM  Result Value Ref Range Status   Fecal Occult Bld Negative Negative Final    RADIOLOGY:  Dg Chest 2 View  07/13/2014   CLINICAL DATA:  Chest pain  EXAM: CHEST  2 VIEW  COMPARISON:  None.  FINDINGS: The heart size and mediastinal contours are within normal limits. Both lungs are clear. The visualized skeletal structures are unremarkable.  IMPRESSION: No active cardiopulmonary disease.   Electronically Signed   By: Rolm Baptise M.D.   On: 07/13/2014 15:23      Management plans discussed with the patient, family and they are in agreement.  CODE STATUS:     Code Status Orders        Start     Ordered   07/14/14 1230  Full code  Continuous     07/14/14 1229      TOTAL TIME TAKING CARE OF THIS PATIENT: 40 minutes.    Henreitta Leber M.D on 07/15/2014 at 2:06 PM  Between 7am to 6pm - Pager - [phone]  After 6pm go to [email or web address] - password [email or web address] [facility]  [facility]  Office  [phone]  CC: Primary care physician; [name], MD

## 2014-07-15 NOTE — Progress Notes (Signed)
Made Dr. Ubaldo Glassing and Dr. Tor Netters aware of brusing around patient's femoral site on the right side and that Night shift stopped the Aggrastat at 05:30 this am due to the increased brusing.

## 2014-07-15 NOTE — Progress Notes (Signed)
Patient discharged.  Made Dr. Ubaldo Glassing and Dr. Tor Netters aware that patient's vitals stable- groin site showing no increased bruising from this morning- Pt walked around unit multiple times without any pain of shortness of breath.  IV and telemetry removed.  Pt given discharge instructions and prescriptions.  Pt discharge by nursing tech in wheelchair with family.

## 2014-07-15 NOTE — Discharge Instructions (Signed)
DIET:  Cardiac diet  DISCHARGE CONDITION:  Stable  ACTIVITY:  Activity as tolerated  OXYGEN:  Home Oxygen: No.   Oxygen Delivery: room air  DISCHARGE LOCATION:  home   If you experience worsening of your admission symptoms, develop shortness of breath, life threatening emergency, suicidal or homicidal thoughts you must seek medical attention immediately by calling 911 or calling your MD immediately  if symptoms less severe.  You Must read complete instructions/literature along with all the possible adverse reactions/side effects for all the Medicines you take and that have been prescribed to you. Take any new Medicines after you have completely understood and accpet all the possible adverse reactions/side effects.   Please note  You were cared for by a hospitalist during your hospital stay. If you have any questions about your discharge medications or the care you received while you were in the hospital after you are discharged, you can call the unit and asked to speak with the hospitalist on call if the hospitalist that took care of you is not available. Once you are discharged, your primary care physician will handle any further medical issues. Please note that NO REFILLS for any discharge medications will be authorized once you are discharged, as it is imperative that you return to your primary care physician (or establish a relationship with a primary care physician if you do not have one) for your aftercare needs so that they can reassess your need for medications and monitor your lab values.   Aspirin and Your Heart Aspirin affects the way your blood clots and helps "thin" the blood. Aspirin has many uses in heart disease. It may be used as a primary prevention to help reduce the risk of heart related events. It also can be used as a secondary measure to prevent more heart attacks or to prevent additional damage from blood clots.  ASPIRIN MAY HELP IF YOU:  Have had a heart  attack or chest pain.  Have undergone open heart surgery such as CABG (Coronary Artery Bypass Surgery).  Have had coronary angioplasty with or without stents.  Have experienced a stroke or TIA (transient ischemic attack).  Have peripheral vascular disease (PAD).  Have chronic heart rhythm problems such as atrial fibrillation.  Are at risk for heart disease. BEFORE STARTING ASPIRIN Before you start taking aspirin, your caregiver will need to review your medical history. Many things will need to be taken into consideration, such as:  Smoking status.  Blood pressure.  Diabetes.  Gender.  Weight.  Cholesterol level. ASPIRIN DOSES  Aspirin should only be taken on the advice of your caregiver. Talk to your caregiver about how much aspirin you should take. Aspirin comes in different doses such as:  81 mg.  162 mg.  325 mg.  The aspirin dose you take may be affected by many factors, some of which include:  Your current medications, especially if your are taking blood-thinners or anti-platelet medicine.  Liver function.  Heart disease risk.  Age.  Aspirin comes in two forms:  Non-enteric-coated. This type of aspirin does not have a coating and is absorbed faster. Non-enteric coated aspirin is recommended for patients experiencing chest pain symptoms. This type of aspirin also comes in a chewable form.  Enteric-coated. This means the aspirin has a special coating that releases the medicine very slowly. Enteric-coated aspirin causes less stomach upset. This type of aspirin should not be chewed or crushed. ASPIRIN SIDE EFFECTS Daily use of aspirin can increase your risk of serious  side effects. Some of these include:  Increased bleeding. This can range from a cut that does not stop bleeding to more serious problems such as stomach bleeding or bleeding into the brain (Intracerebral bleeding).  Increased bruising.  Stomach upset.  An allergic reaction such as red, itchy  skin.  Increased risk of bleeding when combined with non-steroidal anti-inflammatory medicine (NSAIDS).  Alcohol should be drank in moderation when taking aspirin. Alcohol can increase the risk of stomach bleeding when taken with aspirin.  Aspirin should not be given to children less than 85 years of age due to the association of Reye syndrome. Reye syndrome is a serious illness that can affect the brain and liver. Studies have linked Reye syndrome with aspirin use in children.  People that have nasal polyps have an increased risk of developing an aspirin allergy. SEEK MEDICAL CARE IF:   You develop an allergic reaction such as:  Hives.  Itchy skin.  Swelling of the lips, tongue or face.  You develop stomach pain.  You have unusual bleeding or bruising.  You have ringing in your ears. SEEK IMMEDIATE MEDICAL CARE IF:   You have severe chest pain, especially if the pain is crushing or pressure-like and spreads to the arms, back, neck, or jaw. THIS IS AN EMERGENCY. Do not wait to see if the pain will go away. Get medical help at once. Call your local emergency services (911 in the [place] DO NOT drive yourself to the hospital.  You have stroke-like symptoms such as:  Loss of vision.  Difficulty talking.  Numbness or weakness on one side of your body.  Numbness or weakness in your arm or leg.  Not thinking clearly or feeling confused.  Your bowel movements are bloody, dark red or black in color.  You vomit or cough up blood.  You have blood in your urine.  You have shortness of breath, coughing or wheezing. MAKE SURE YOU:   Understand these instructions.  Will monitor your condition.  Seek immediate medical care if necessary. Document Released: 12/14/2007 Document Revised: 04/27/2012 Document Reviewed: 04/07/2013 [facility] Patient Information 2015 [employer], [place]. This information is not intended to replace advice given to you by your health care provider. Make sure  you discuss any questions you have with your health care provider.

## 2014-07-16 ENCOUNTER — Encounter: Payer: Self-pay | Admitting: Family Medicine

## 2014-07-16 ENCOUNTER — Telehealth: Payer: Self-pay | Admitting: Family Medicine

## 2014-07-16 NOTE — Telephone Encounter (Signed)
Discharged 07/15/2014 for NSTEMI s/p cath and stent L prox circ. plz call for hosp f/u phone call. Ensure has f/u appt with cards scheduled and offer f/u appt in office in 1-2 wks, but if seeing cards and doesn't want to come in that is ok too.

## 2014-07-19 NOTE — Telephone Encounter (Signed)
Patient's wife states patient is doing well since hospital discharge.  Cardiology follow up is scheduled for next week.  Follow with Dr. Danise Mina 7/8.

## 2014-07-20 NOTE — Telephone Encounter (Signed)
Pt has been dc'd from hospital and has f/u scheduled.

## 2014-07-22 ENCOUNTER — Encounter: Payer: Self-pay | Admitting: Family Medicine

## 2014-07-22 ENCOUNTER — Ambulatory Visit (INDEPENDENT_AMBULATORY_CARE_PROVIDER_SITE_OTHER): Payer: Medicare HMO | Admitting: Family Medicine

## 2014-07-22 VITALS — BP 124/62 | HR 72 | Temp 97.9°F | Wt 170.8 lb

## 2014-07-22 DIAGNOSIS — I214 Non-ST elevation (NSTEMI) myocardial infarction: Secondary | ICD-10-CM | POA: Diagnosis not present

## 2014-07-22 DIAGNOSIS — I25110 Atherosclerotic heart disease of native coronary artery with unstable angina pectoris: Secondary | ICD-10-CM

## 2014-07-22 NOTE — Patient Instructions (Signed)
You are doing well today. Return as needed or for next wellness visit. Continue medicines as up to now.  Let us or Dr Ubaldo Glassing know if you need refills or if any new symptoms returning.

## 2014-07-22 NOTE — Assessment & Plan Note (Signed)
Resolved 07/2014)

## 2014-07-22 NOTE — Progress Notes (Signed)
Pre visit review using our clinic review tool, if applicable. No additional management support is needed unless otherwise documented below in the visit note. 

## 2014-07-22 NOTE — Progress Notes (Signed)
   BP 124/62 mmHg  Pulse 72  Temp(Src) 97.9 F (36.6 C) (Oral)  Wt 170 lb 12 oz (77.452 kg)   CC: hosp f/u visit  Subjective:    Patient ID: Albert Giles, male    DOB: December 07, 1945, 69 y.o.   MRN: [id number]  HPI: Albert Giles is a 69 y.o. male presenting on 07/22/2014 for Follow-up   Records reviewed. Presented to hospital with chest pain, found to have NSTEMI s/p cath [facility]) and stent L prox circ (99% stenosis). discharged on aspirin, plavix, metoprolol, statin. Also found to have mild LV dysfunction 40%. Catheterization led to R groin bruise which continues to heal. Denies wheezing or dyspnea.   Has f/u with Dr Ubaldo Glassing on Monday.  Otherwise prior healthy patient.  DATE OF ADMISSION: 07/13/2014 DATE OF DISCHARGE: 07/15/2014 F/u phone call: 07/19/2014 DDx: unstable angina with NSTEMI Additional dx: mild emphysema and ex smoker.  Relevant past medical, surgical, family and social history reviewed and updated as indicated. Interim medical history since our last visit reviewed. Allergies and medications reviewed and updated. Current Outpatient Prescriptions on File Prior to Visit  Medication Sig  . aspirin EC 81 MG tablet Take 1 tablet (81 mg total) by mouth daily.  Marland Kitchen atorvastatin (LIPITOR) 40 MG tablet Take 1 tablet (40 mg total) by mouth daily at 6 PM.  . clopidogrel (PLAVIX) 75 MG tablet Take 1 tablet (75 mg total) by mouth daily.  . metoprolol tartrate (LOPRESSOR) 25 MG tablet Take 1 tablet (25 mg total) by mouth 2 (two) times daily.   No current facility-administered medications on file prior to visit.    Review of Systems Per HPI unless specifically indicated above     Objective:    BP 124/62 mmHg  Pulse 72  Temp(Src) 97.9 F (36.6 C) (Oral)  Wt 170 lb 12 oz (77.452 kg)  Wt Readings from Last 3 Encounters:  07/22/14 170 lb 12 oz (77.452 kg)  07/13/14 168 lb 8 oz (76.431 kg)  12/27/13 174 lb (78.926 kg)    Physical Exam  Constitutional: He appears well-developed and  well-nourished. No distress.  HENT:  Mouth/Throat: Oropharynx is clear and moist. No oropharyngeal exudate.  Cardiovascular: Normal rate, regular rhythm, normal heart sounds and intact distal pulses.   No murmur heard. Pulmonary/Chest: Effort normal and breath sounds normal. No respiratory distress. He has no wheezes. He has no rales.  Musculoskeletal: He exhibits no edema.  Psychiatric: He has a normal mood and affect.  Nursing note and vitals reviewed.     Assessment & Plan:  Transitional care visit not billed because anticipate cardiology will perform this visit. Problem List Items Addressed This Visit    CAD (coronary artery disease) - Primary    S/p stent to proximal circumferential artery. Tolerating medical therapy with aspirin and plavix daily along with statin and metoprolol 25mg  bid. Continue current treatment course. Keep appt with Dr Ubaldo Glassing for Monday. Pt agrees with plan, questions answered.      NSTEMI (non-ST elevated myocardial infarction)    Resolved 07/2014)          Follow up plan: Return if symptoms worsen or fail to improve.

## 2014-07-22 NOTE — Assessment & Plan Note (Signed)
S/p stent to proximal circumferential artery. Tolerating medical therapy with aspirin and plavix daily along with statin and metoprolol 25mg  bid. Continue current treatment course. Keep appt with Dr Ubaldo Glassing for Monday. Pt agrees with plan, questions answered.

## 2014-08-05 DIAGNOSIS — I2510 Atherosclerotic heart disease of native coronary artery without angina pectoris: Secondary | ICD-10-CM | POA: Insufficient documentation

## 2014-12-22 ENCOUNTER — Other Ambulatory Visit: Payer: Self-pay | Admitting: Family Medicine

## 2014-12-22 DIAGNOSIS — I214 Non-ST elevation (NSTEMI) myocardial infarction: Secondary | ICD-10-CM

## 2014-12-23 ENCOUNTER — Other Ambulatory Visit (INDEPENDENT_AMBULATORY_CARE_PROVIDER_SITE_OTHER): Payer: Commercial Managed Care - HMO

## 2014-12-23 DIAGNOSIS — I214 Non-ST elevation (NSTEMI) myocardial infarction: Secondary | ICD-10-CM | POA: Diagnosis not present

## 2014-12-23 LAB — BASIC METABOLIC PANEL
BUN: 13 mg/dL (ref 6–23)
CALCIUM: 9.2 mg/dL (ref 8.4–10.5)
CHLORIDE: 106 meq/L (ref 96–112)
CO2: 24 meq/L (ref 19–32)
CREATININE: 1.43 mg/dL (ref 0.40–1.50)
GFR: 51.75 mL/min — ABNORMAL LOW (ref >60.00)
Glucose, Bld: 101 mg/dL — ABNORMAL HIGH (ref 70–99)
Potassium: 4.5 mEq/L (ref 3.5–5.1)
Sodium: 140 mEq/L (ref 135–145)

## 2014-12-23 LAB — LIPID PANEL
CHOL/HDL RATIO: 3
Cholesterol: 114 mg/dL (ref 0–200)
HDL: 37.9 mg/dL — AB (ref >39.00)
LDL Cholesterol: 44 mg/dL (ref 0–99)
NonHDL: 76.5
TRIGLYCERIDES: 161 mg/dL — AB (ref 0.0–149.0)
VLDL: 32.2 mg/dL (ref 0.0–40.0)

## 2014-12-30 ENCOUNTER — Encounter: Payer: Self-pay | Admitting: Family Medicine

## 2014-12-30 ENCOUNTER — Ambulatory Visit (INDEPENDENT_AMBULATORY_CARE_PROVIDER_SITE_OTHER): Payer: Commercial Managed Care - HMO | Admitting: Family Medicine

## 2014-12-30 VITALS — BP 124/76 | HR 72 | Temp 97.4°F | Ht 68.25 in | Wt 177.5 lb

## 2014-12-30 DIAGNOSIS — N289 Disorder of kidney and ureter, unspecified: Secondary | ICD-10-CM

## 2014-12-30 DIAGNOSIS — N183 Chronic kidney disease, stage 3 unspecified: Secondary | ICD-10-CM | POA: Insufficient documentation

## 2014-12-30 DIAGNOSIS — Z23 Encounter for immunization: Secondary | ICD-10-CM

## 2014-12-30 DIAGNOSIS — Z0000 Encounter for general adult medical examination without abnormal findings: Secondary | ICD-10-CM | POA: Diagnosis not present

## 2014-12-30 DIAGNOSIS — Z7189 Other specified counseling: Secondary | ICD-10-CM

## 2014-12-30 DIAGNOSIS — I25110 Atherosclerotic heart disease of native coronary artery with unstable angina pectoris: Secondary | ICD-10-CM

## 2014-12-30 DIAGNOSIS — Z1211 Encounter for screening for malignant neoplasm of colon: Secondary | ICD-10-CM

## 2014-12-30 NOTE — Assessment & Plan Note (Signed)

## 2014-12-30 NOTE — Assessment & Plan Note (Signed)
Preventative protocols reviewed and updated unless pt declined. Discussed healthy diet and lifestyle.  

## 2014-12-30 NOTE — Progress Notes (Signed)
Pre visit review using our clinic review tool, if applicable. No additional management support is needed unless otherwise documented below in the visit note. 

## 2014-12-30 NOTE — Assessment & Plan Note (Addendum)
Stable on current regimen. Continue. Has f/u with Dr Ubaldo Glassing 04/2015.

## 2014-12-30 NOTE — Progress Notes (Addendum)
BP 124/76 mmHg  Pulse 72  Temp(Src) 97.4 F (36.3 C) (Oral)  Ht 5' 8.25" (1.734 m)  Wt 177 lb 8 oz (80.513 kg)  BMI 26.78 kg/m2   CC: medicare wellness visit  Subjective:    Patient ID: Albert Giles, male    DOB: 11-23-1945, 69 y.o.   MRN: [id number]  HPI: Albert Giles is a 70 y.o. male presenting on 12/30/2014 for Annual Exam   Hospitalization at [facility] 07/2014 for unstable angina with NSTEMI s/p cath and stent L prox circ (Dr Fath/[place]). Started on aspirin, plavix, metoprolol, statin. Doing well.  Vision screen passed Hearing screen passed No falls No mood issues/depression/anhedonia.   Preventative: Colon cancer screening - does have fmhx. Last iFOB normal 2014. Requests rpt today.  Prostate cancer screening - discussed, decided not to screen. Flu - today Prevnar 2015 Td 2009 zostavax - declines Advanced directives - doesn't have this set up. Would want wife then 2 daughters. Packet provided today.  Seat belt use discussed Sunscreen use discussed. No changing moles on skin.  Caffeine: 2 cups coffee, 2 cups tea/day Lives with wife Occupation: Higher education careers adviser, retired Edu: HS Activity: works in yard, walking Diet: some water, fruits/vegetables daily  Relevant past medical, surgical, family and social history reviewed and updated as indicated. Interim medical history since our last visit reviewed. Allergies and medications reviewed and updated. Current Outpatient Prescriptions on File Prior to Visit  Medication Sig  . aspirin EC 81 MG tablet Take 1 tablet (81 mg total) by mouth daily.  Marland Kitchen atorvastatin (LIPITOR) 40 MG tablet Take 1 tablet (40 mg total) by mouth daily at 6 PM.  . clopidogrel (PLAVIX) 75 MG tablet Take 1 tablet (75 mg total) by mouth daily.  . metoprolol tartrate (LOPRESSOR) 25 MG tablet Take 1 tablet (25 mg total) by mouth 2 (two) times daily.   No current facility-administered medications on file prior to visit.    Review of Systems    Constitutional: Negative for fever, chills, activity change, appetite change, fatigue and unexpected weight change.  HENT: Negative for hearing loss.   Eyes: Negative for visual disturbance.  Respiratory: Negative for cough, chest tightness, shortness of breath and wheezing.   Cardiovascular: Negative for chest pain, palpitations and leg swelling.  Gastrointestinal: Negative for nausea, vomiting, abdominal pain, diarrhea, constipation, blood in stool and abdominal distention.  Genitourinary: Negative for hematuria and difficulty urinating.  Musculoskeletal: Negative for myalgias, arthralgias and neck pain.  Skin: Negative for rash.  Neurological: Negative for dizziness, seizures, syncope and headaches.  Hematological: Negative for adenopathy. Does not bruise/bleed easily.  Psychiatric/Behavioral: Negative for dysphoric mood. The patient is not nervous/anxious.    Per HPI unless specifically indicated in ROS section     Objective:    BP 124/76 mmHg  Pulse 72  Temp(Src) 97.4 F (36.3 C) (Oral)  Ht 5' 8.25" (1.734 m)  Wt 177 lb 8 oz (80.513 kg)  BMI 26.78 kg/m2  Wt Readings from Last 3 Encounters:  12/30/14 177 lb 8 oz (80.513 kg)  07/22/14 170 lb 12 oz (77.452 kg)  07/13/14 168 lb 8 oz (76.431 kg)    Physical Exam  Constitutional: He is oriented to person, place, and time. He appears well-developed and well-nourished. No distress.  HENT:  Head: Normocephalic and atraumatic.  Right Ear: Hearing, tympanic membrane, external ear and ear canal normal.  Left Ear: Hearing, tympanic membrane, external ear and ear canal normal.  Nose: Nose normal.  Mouth/Throat: Uvula is midline,  oropharynx is clear and moist and mucous membranes are normal. No oropharyngeal exudate, posterior oropharyngeal edema or posterior oropharyngeal erythema.  Eyes: Conjunctivae and EOM are normal. Pupils are equal, round, and reactive to light. No scleral icterus.  Neck: Normal range of motion. Neck supple.  Carotid bruit is not present. No thyromegaly present.  Cardiovascular: Normal rate, regular rhythm, normal heart sounds and intact distal pulses.   No murmur heard. Pulses:      Radial pulses are 2+ on the right side, and 2+ on the left side.  Pulmonary/Chest: Effort normal and breath sounds normal. No respiratory distress. He has no wheezes. He has no rales.  Abdominal: Soft. Bowel sounds are normal. He exhibits no distension and no mass. There is no tenderness. There is no rebound and no guarding.  Musculoskeletal: Normal range of motion. He exhibits no edema.  Lymphadenopathy:    He has no cervical adenopathy.  Neurological: He is alert and oriented to person, place, and time.  CN grossly intact, station and gait intact Recall 2/3, 3/3 with cue Calculation 5/5 serial 3s  Skin: Skin is warm and dry. No rash noted.  Psychiatric: He has a normal mood and affect. His behavior is normal. Judgment and thought content normal.  Nursing note and vitals reviewed.  Results for orders placed or performed in visit on 12/23/14  Lipid panel  Result Value Ref Range   Cholesterol 114 0 - 200 mg/dL   Triglycerides 161.0 (H) 0.0 - 149.0 mg/dL   HDL 37.90 (L) >39.00 mg/dL   VLDL 32.2 0.0 - 40.0 mg/dL   LDL Cholesterol 44 0 - 99 mg/dL   Total CHOL/HDL Ratio 3    NonHDL [id number]   Basic metabolic panel  Result Value Ref Range   Sodium 140 135 - 145 mEq/L   Potassium 4.5 3.5 - 5.1 mEq/L   Chloride 106 96 - 112 mEq/L   CO2 24 19 - 32 mEq/L   Glucose, Bld 101 (H) 70 - 99 mg/dL   BUN 13 6 - 23 mg/dL   Creatinine, Ser 1.43 0.40 - 1.50 mg/dL   Calcium 9.2 8.4 - 10.5 mg/dL   GFR 51.75 (L) >60.00 mL/min      Assessment & Plan:   Problem List Items Addressed This Visit    Renal insufficiency    Relatively new over last year with improvement then deterioration. Discussed avoidance of NSAIDs and other nephrotoxins. Encouraged good hydration status. Recommended decrease caffeine and sweet tea.       Medicare annual wellness visit, subsequent - Primary    I have personally reviewed the Medicare Annual Wellness questionnaire and have noted 1. The patient's medical and social history 2. Their use of alcohol, tobacco or illicit drugs 3. Their current medications and supplements 4. The patient's functional ability including ADL's, fall risks, home safety risks and hearing or visual impairment. Cognitive function has been assessed and addressed as indicated.  5. Diet and physical activity 6. Evidence for depression or mood disorders The patients weight, height, BMI have been recorded in the chart. I have made referrals, counseling and provided education to the patient based on review of the above and I have provided the pt with a written personalized care plan for preventive services. Provider list updated.. See scanned questionairre as needed for further documentation. Reviewed preventative protocols and updated unless pt declined.       Health maintenance examination    Preventative protocols reviewed and updated unless pt declined. Discussed healthy diet and  lifestyle.       CAD (coronary artery disease)    Stable on current regimen. Continue. Has f/u with Dr Ubaldo Glassing 04/2015.      Advanced care planning/counseling discussion    Advanced directives - doesn't have this set up. Would want wife then 2 daughters. Packet provided today.        Other Visit Diagnoses    Need for influenza vaccination        Relevant Orders    Flu Vaccine QUAD 36+ mos PF IM (Fluarix & Fluzone Quad PF) (Completed)    Special screening for malignant neoplasms, colon        Relevant Orders    Fecal occult blood, imunochemical        Follow up plan: Return in about 1 year (around 12/30/2015), or as needed, for medicare wellness visit.

## 2014-12-30 NOTE — Addendum Note (Signed)
Addended by: [name] on: 12/30/2014 12:21 PM   Modules accepted: Miquel Dunn

## 2014-12-30 NOTE — Assessment & Plan Note (Signed)
Advanced directives - doesn't have this set up. Would want wife then 2 daughters. Packet provided today. 

## 2014-12-30 NOTE — Assessment & Plan Note (Signed)
Relatively new over last year with improvement then deterioration. Discussed avoidance of NSAIDs and other nephrotoxins. Encouraged good hydration status. Recommended decrease caffeine and sweet tea.

## 2014-12-30 NOTE — Patient Instructions (Addendum)
Flu shot today Pass by lab for a stool kit. Advanced directive packet provided today. For kidneys - drink plenty of water, avoid NSAIDs (ibuprofen, aleve, advil) Good to see you today, call us with questions Return as needed or in 1 year for next medicare wellness visit  Health Maintenance, Male A healthy lifestyle and preventative care can promote health and wellness.  Maintain regular health, dental, and eye exams.  Eat a healthy diet. Foods like vegetables, fruits, whole grains, low-fat dairy products, and lean protein foods contain the nutrients you need and are low in calories. Decrease your intake of foods high in solid fats, added sugars, and salt. Get information about a proper diet from your health care provider, if necessary.  Regular physical exercise is one of the most important things you can do for your health. Most adults should get at least 150 minutes of moderate-intensity exercise (any activity that increases your heart rate and causes you to sweat) each week. In addition, most adults need muscle-strengthening exercises on 2 or more days a week.   Maintain a healthy weight. The body mass index (BMI) is a screening tool to identify possible weight problems. It provides an estimate of body fat based on height and weight. Your health care provider can find your BMI and can help you achieve or maintain a healthy weight. For males 20 years and older:  A BMI below 18.5 is considered underweight.  A BMI of 18.5 to 24.9 is normal.  A BMI of 25 to 29.9 is considered overweight.  A BMI of 30 and above is considered obese.  Maintain normal blood lipids and cholesterol by exercising and minimizing your intake of saturated fat. Eat a balanced diet with plenty of fruits and vegetables. Blood tests for lipids and cholesterol should begin at age 65 and be repeated every 5 years. If your lipid or cholesterol levels are high, you are over age 47, or you are at high risk for heart disease,  you may need your cholesterol levels checked more frequently.Ongoing high lipid and cholesterol levels should be treated with medicines if diet and exercise are not working.  If you smoke, find out from your health care provider how to quit. If you do not use tobacco, do not start.  Lung cancer screening is recommended for adults aged 48-80 years who are at high risk for developing lung cancer because of a history of smoking. A yearly low-dose CT scan of the lungs is recommended for people who have at least a 30-pack-year history of smoking and are current smokers or have quit within the past 15 years. A pack year of smoking is smoking an average of 1 pack of cigarettes a day for 1 year (for example, a 30-pack-year history of smoking could mean smoking 1 pack a day for 30 years or 2 packs a day for 15 years). Yearly screening should continue until the smoker has stopped smoking for at least 15 years. Yearly screening should be stopped for people who develop a health problem that would prevent them from having lung cancer treatment.  If you choose to drink alcohol, do not have more than 2 drinks per day. One drink is considered to be 12 oz (360 mL) of beer, 5 oz (150 mL) of wine, or 1.5 oz (45 mL) of liquor.  Avoid the use of street drugs. Do not share needles with anyone. Ask for help if you need support or instructions about stopping the use of drugs.  High blood  pressure causes heart disease and increases the risk of stroke. High blood pressure is more likely to develop in:  People who have blood pressure in the end of the normal range (100-139/85-89 mm Hg).  People who are overweight or obese.  People who are African American.  If you are 38-66 years of age, have your blood pressure checked every 3-5 years. If you are 49 years of age or older, have your blood pressure checked every year. You should have your blood pressure measured twice--once when you are at a hospital or clinic, and once when  you are not at a hospital or clinic. Record the average of the two measurements. To check your blood pressure when you are not at a hospital or clinic, you can use:  An automated blood pressure machine at a pharmacy.  A home blood pressure monitor.  If you are 74-42 years old, ask your health care provider if you should take aspirin to prevent heart disease.  Diabetes screening involves taking a blood sample to check your fasting blood sugar level. This should be done once every 3 years after age 73 if you are at a normal weight and without risk factors for diabetes. Testing should be considered at a younger age or be carried out more frequently if you are overweight and have at least 1 risk factor for diabetes.  Colorectal cancer can be detected and often prevented. Most routine colorectal cancer screening begins at the age of 22 and continues through age 40. However, your health care provider may recommend screening at an earlier age if you have risk factors for colon cancer. On a yearly basis, your health care provider may provide home test kits to check for hidden blood in the stool. A small camera at the end of a tube may be used to directly examine the colon (sigmoidoscopy or colonoscopy) to detect the earliest forms of colorectal cancer. Talk to your health care provider about this at age 40 when routine screening begins. A direct exam of the colon should be repeated every 5-10 years through age 44, unless early forms of precancerous polyps or small growths are found.  People who are at an increased risk for hepatitis B should be screened for this virus. You are considered at high risk for hepatitis B if:  You were born in a country where hepatitis B occurs often. Talk with your health care provider about which countries are considered high risk.  Your parents were born in a high-risk country and you have not received a shot to protect against hepatitis B (hepatitis B vaccine).  You have HIV  or AIDS.  You use needles to inject street drugs.  You live with, or have sex with, someone who has hepatitis B.  You are a man who has sex with other men (MSM).  You get hemodialysis treatment.  You take certain medicines for conditions like cancer, organ transplantation, and autoimmune conditions.  Hepatitis C blood testing is recommended for all people born from [birth year] through 1967 and any individual with known risk factors for hepatitis C.  Healthy men should no longer receive prostate-specific antigen (PSA) blood tests as part of routine cancer screening. Talk to your health care provider about prostate cancer screening.  Testicular cancer screening is not recommended for adolescents or adult males who have no symptoms. Screening includes self-exam, a health care provider exam, and other screening tests. Consult with your health care provider about any symptoms you have or any concerns you  have about testicular cancer.  Practice safe sex. Use condoms and avoid high-risk sexual practices to reduce the spread of sexually transmitted infections (STIs).  You should be screened for STIs, including gonorrhea and chlamydia if:  You are sexually active and are younger than 24 years.  You are older than 24 years, and your health care provider tells you that you are at risk for this type of infection.  Your sexual activity has changed since you were last screened, and you are at an increased risk for chlamydia or gonorrhea. Ask your health care provider if you are at risk.  If you are at risk of being infected with HIV, it is recommended that you take a prescription medicine daily to prevent HIV infection. This is called pre-exposure prophylaxis (PrEP). You are considered at risk if:  You are a man who has sex with other men (MSM).  You are a heterosexual man who is sexually active with multiple partners.  You take drugs by injection.  You are sexually active with a partner who has  HIV.  Talk with your health care provider about whether you are at high risk of being infected with HIV. If you choose to begin PrEP, you should first be tested for HIV. You should then be tested every 3 months for as long as you are taking PrEP.  Use sunscreen. Apply sunscreen liberally and repeatedly throughout the day. You should seek shade when your shadow is shorter than you. Protect yourself by wearing long sleeves, pants, a wide-brimmed hat, and sunglasses year round whenever you are outdoors.  Tell your health care provider of new moles or changes in moles, especially if there is a change in shape or color. Also, tell your health care provider if a mole is larger than the size of a pencil eraser.  A one-time screening for abdominal aortic aneurysm (AAA) and surgical repair of large AAAs by ultrasound is recommended for men aged 80-75 years who are current or former smokers.  Stay current with your vaccines (immunizations).   This information is not intended to replace advice given to you by your health care provider. Make sure you discuss any questions you have with your health care provider.   Document Released: 06/29/2007 Document Revised: 01/21/2014 Document Reviewed: 05/28/2010 Elsevier Interactive Patient Education [employer].

## 2015-01-06 ENCOUNTER — Other Ambulatory Visit: Payer: Self-pay

## 2015-01-06 DIAGNOSIS — Z1211 Encounter for screening for malignant neoplasm of colon: Secondary | ICD-10-CM

## 2015-01-06 LAB — FECAL OCCULT BLOOD, GUAIAC: FECAL OCCULT BLD: NEGATIVE

## 2015-01-06 LAB — FECAL OCCULT BLOOD, IMMUNOCHEMICAL: FECAL OCCULT BLD: NEGATIVE

## 2015-01-10 ENCOUNTER — Encounter: Payer: Self-pay | Admitting: *Deleted

## 2015-03-20 ENCOUNTER — Ambulatory Visit (INDEPENDENT_AMBULATORY_CARE_PROVIDER_SITE_OTHER): Payer: Commercial Managed Care - HMO | Admitting: Family Medicine

## 2015-03-20 ENCOUNTER — Encounter: Payer: Self-pay | Admitting: Family Medicine

## 2015-03-20 VITALS — BP 126/82 | HR 80 | Temp 97.6°F | Wt 179.8 lb

## 2015-03-20 DIAGNOSIS — N401 Enlarged prostate with lower urinary tract symptoms: Secondary | ICD-10-CM | POA: Insufficient documentation

## 2015-03-20 DIAGNOSIS — N138 Other obstructive and reflux uropathy: Secondary | ICD-10-CM | POA: Insufficient documentation

## 2015-03-20 DIAGNOSIS — R350 Frequency of micturition: Secondary | ICD-10-CM | POA: Diagnosis not present

## 2015-03-20 LAB — POC URINALSYSI DIPSTICK (AUTOMATED)
BILIRUBIN UA: NEGATIVE
GLUCOSE UA: NEGATIVE
Ketones, UA: NEGATIVE
Leukocytes, UA: NEGATIVE
Nitrite, UA: NEGATIVE
Protein, UA: NEGATIVE
SPEC GRAV UA: 1.025
Urobilinogen, UA: 0.2
pH, UA: 6

## 2015-03-20 MED ORDER — TAMSULOSIN HCL 0.4 MG PO CAPS: 0.4000 mg | ORAL_CAPSULE | Freq: Every day | ORAL | Status: DC

## 2015-03-20 NOTE — Assessment & Plan Note (Signed)
Predominantly with nocturia and dribbling.  DRE only with generalized enlargement noted. Check PSA today.  Start flomax 0.4mg  nightly. Update with effect.  UA with 1+ blood but micro WNL (1-2 RBC/hpf). Regardless, rec return in 2 wks for lab visit only for rpt UA and if abnormal send off for microscopy. Risk factors including smoking history and fume exposure through occupation. No fmhx bladder cancer.

## 2015-03-20 NOTE — Patient Instructions (Addendum)
Urine overall ok today - return in 2 weeks to drop off another specimen (lab visit only) PSA blood test today.  I think this is from benign enlargement of prostate pushing on bladder causing urge at night time. Treat with flomax nightly. Update [place] if no improvement noted on medicine.  Benign Prostatic Hypertrophy The prostate gland is part of the reproductive system of men. A normal prostate is about the size and shape of a walnut. The prostate gland produces a fluid that is mixed with sperm to make semen. This gland surrounds the urethra and is located in front of the rectum and just below the bladder. The bladder is where urine is stored. The urethra is the tube through which urine passes from the bladder to get out of the body. The prostate grows as a man ages. An enlarged prostate not caused by cancer is called benign prostatic hypertrophy (BPH). An enlarged prostate can press on the urethra. This can make it harder to pass urine. In the early stages of enlargement, the bladder can get by with a narrowed urethra by forcing the urine through. If the problem gets worse, medical or surgical treatment may be required.  This condition should be followed by your health care provider. The accumulation of urine in the bladder can cause infection. Back pressure and infection can progress to bladder damage and kidney (renal) failure. If needed, your health care provider may refer you to a specialist in kidney and prostate disease (urologist). CAUSES  BPH is a common health problem in men older than 50 years. This condition is a normal part of aging. However, not all men will develop problems from this condition. If the enlargement grows away from the urethra, then there will not be any compression of the urethra and resistance to urine flow.If the growth is toward the urethra and compresses it, you will experience difficulty urinating.  SYMPTOMS   Not able to completely empty your bladder.  Getting up  often during the night to urinate.  Need to urinate frequently during the day.  Difficultly starting urine flow.  Decrease in size and strength of your urine stream.  Dribbling after urination.  Pain on urination (more common with infection).  Inability to pass urine. This needs immediate treatment.  The development of a urinary tract infection. DIAGNOSIS  These tests will help your health care provider understand your problem:  A thorough history and physical examination.  A urination history, with the number of times you urinate, the amounts of urine, the strength of the urine stream, and the feeling of emptiness or fullness after urinating.  A postvoid bladder scan that measures any amount of urine that may remain in your bladder after you finish urinating.  Digital rectal exam. In a rectal exam, your health care provider checks your prostate by putting a gloved, lubricated finger into your rectum to feel the back of your prostate gland. This exam detects the size of your gland and abnormal lumps or growths.  Exam of your urine (urinalysis).  Prostate specific antigen (PSA) screening. This is a blood test used to screen for prostate cancer.  Rectal ultrasonography. This test uses sound waves to electronically produce a picture of your prostate gland. TREATMENT  Once symptoms begin, your health care provider will monitor your condition. Of the men with this condition, one third will have symptoms that stabilize, one third will have symptoms that improve, and one third will have symptoms that progress in the first year. Mild symptoms  may not need treatment. Simple observation and yearly exams may be all that is required. Medicines and surgery are options for more severe problems. Your health care provider can help you make an informed decision for what is best. Two classes of medicines are available for relief of prostate symptoms:  Medicines that shrink the prostate. This helps  relieve symptoms. These medicines take time to work, and it may be months before any improvement is seen.  Uncommon side effects include problems with sexual function.  Medicines to relax the muscle of the prostate. This also relieves the obstruction by reducing any compression on the urethra.This group of medicines work much faster than those that reduce the size of the prostate gland. Usually, one can experience improvement in days to weeks..  Side effects can include dizziness, fatigue, lightheadedness, and retrograde ejaculation (diminished volume of ejaculate). Several types of surgical treatments are available for relief of prostate symptoms:  Transurethral resection of the prostate (TURP)--In this treatment, an instrument is inserted through opening at the tip of the penis. It is used to cut away pieces of the inner core of the prostate. The pieces are removed through the same opening of the penis. This removes the obstruction and helps get rid of the symptoms.  Transurethral incision (TUIP)--In this procedure, small cuts are made in the prostate. This lessens the prostates pressure on the urethra.  Transurethral microwave thermotherapy (TUMT)--This procedure uses microwaves to create heat. The heat destroys and removes a small amount of prostate tissue.  Transurethral needle ablation (TUNA)--This is a procedure that uses radio frequencies to do the same as TUMT.  Interstitial laser coagulation (ILC)--This is a procedure that uses a laser to do the same as TUMT and TUNA.  Transurethral electrovaporization (TUVP)--This is a procedure that uses electrodes to do the same as the procedures listed above. SEEK MEDICAL CARE IF:   You develop a fever.  There is unexplained back pain.  Symptoms are not helped by medicines prescribed.  You develop side effects from the medicine you are taking.  Your urine becomes very dark or has a bad smell.  Your lower abdomen becomes distended and  you have difficulty passing your urine. SEEK IMMEDIATE MEDICAL CARE IF:   You are suddenly unable to urinate. This is an emergency. You should be seen immediately.  There are large amounts of blood or clots in the urine.  Your urinary problems become unmanageable.  You develop lightheadedness, severe dizziness, or you feel faint.  You develop moderate to severe low back or flank pain.  You develop chills or fever.   This information is not intended to replace advice given to you by your health care provider. Make sure you discuss any questions you have with your health care provider.   Document Released: 12/31/2004 Document Revised: 01/05/2013 Document Reviewed: 07/16/2012 Elsevier Interactive Patient Education [employer].

## 2015-03-20 NOTE — Progress Notes (Signed)
Pre visit review using our clinic review tool, if applicable. No additional management support is needed unless otherwise documented below in the visit note. 

## 2015-03-20 NOTE — Progress Notes (Signed)
BP 126/82 mmHg  Pulse 80  Temp(Src) 97.6 F (36.4 C) (Oral)  Wt 179 lb 12 oz (81.534 kg)   CC: urinary frequency  Subjective:    Patient ID: Albert Giles, male    DOB: 08/28/1945, 70 y.o.   MRN: [id number]  HPI: Albert Giles is a 70 y.o. male presenting on 03/20/2015 for Urinary Frequency   Endorses nocturia x4-5 over last 2-3 yrs, worsening. Also notices urinary dribbling. Still strong stream. No pain or dysuria. No gross hematuria. No urgency. No fevers/chills, nausea/vomiting. No rectal pain.  No h/o kidney stones. Drinks some coffee and tea daily.   Former smoker 74 PY hx, quit 2000. Welder/pipe fitter  Had declined prostate screening in the past. No results found for: PSA   Relevant past medical, surgical, family and social history reviewed and updated as indicated. Interim medical history since our last visit reviewed. Allergies and medications reviewed and updated. Current Outpatient Prescriptions on File Prior to Visit  Medication Sig  . aspirin EC 81 MG tablet Take 1 tablet (81 mg total) by mouth daily.  Marland Kitchen atorvastatin (LIPITOR) 40 MG tablet Take 1 tablet (40 mg total) by mouth daily at 6 PM.  . clopidogrel (PLAVIX) 75 MG tablet Take 1 tablet (75 mg total) by mouth daily.  . metoprolol tartrate (LOPRESSOR) 25 MG tablet Take 1 tablet (25 mg total) by mouth 2 (two) times daily.   No current facility-administered medications on file prior to visit.   Social History  Substance Use Topics  . Smoking status: Former Smoker -- 1.00 packs/day for 35 years    Types: Cigarettes    Quit date: 07/07/1998  . Smokeless tobacco: Never Used  . Alcohol Use: No    Review of Systems Per HPI unless specifically indicated in ROS section     Objective:    BP 126/82 mmHg  Pulse 80  Temp(Src) 97.6 F (36.4 C) (Oral)  Wt 179 lb 12 oz (81.534 kg)  Wt Readings from Last 3 Encounters:  03/20/15 179 lb 12 oz (81.534 kg)  12/30/14 177 lb 8 oz (80.513 kg)  07/22/14 170 lb 12 oz  (77.452 kg)    Physical Exam  Constitutional: He appears well-developed and well-nourished. No distress.  Abdominal: Soft. Normal appearance and bowel sounds are normal. He exhibits no distension and no mass. There is no hepatosplenomegaly. There is no tenderness. There is no rigidity, no rebound, no guarding, no CVA tenderness and negative Murphy's sign.  Genitourinary: Rectum normal. Rectal exam shows no external hemorrhoid, no internal hemorrhoid, no fissure, no mass, no tenderness and anal tone normal. Prostate is enlarged (30+gm). Prostate is not tender.  Musculoskeletal: He exhibits no edema.  Nursing note and vitals reviewed.  Results for orders placed or performed in visit on 03/20/15  POCT Urinalysis Dipstick (Automated)  Result Value Ref Range   Color, UA Yellow    Clarity, UA Clear    Glucose, UA Negative    Bilirubin, UA Negative    Ketones, UA Negative    Spec Grav, UA 1.025    Blood, UA 1+    pH, UA 6.0    Protein, UA Negative    Urobilinogen, UA 0.2    Nitrite, UA Negative    Leukocytes, UA Negative Negative      Assessment & Plan:   Problem List Items Addressed This Visit    BPH with obstruction/lower urinary tract symptoms - Primary    Predominantly with nocturia and dribbling.  DRE only  with generalized enlargement noted. Check PSA today.  Start flomax 0.4mg  nightly. Update with effect.  UA with 1+ blood but micro WNL (1-2 RBC/hpf). Regardless, rec return in 2 wks for lab visit only for rpt UA and if abnormal send off for microscopy. Risk factors including smoking history and fume exposure through occupation. No fmhx bladder cancer.       Relevant Medications   tamsulosin (FLOMAX) 0.4 MG CAPS capsule   Other Relevant Orders   Basic metabolic panel   PSA    Other Visit Diagnoses    Urine frequency        Relevant Orders    POCT Urinalysis Dipstick (Automated) (Completed)        Follow up plan: Return if symptoms worsen or fail to improve.

## 2015-03-21 LAB — BASIC METABOLIC PANEL
BUN: 17 mg/dL (ref 6–23)
CO2: 26 meq/L (ref 19–32)
CREATININE: 1.43 mg/dL (ref 0.40–1.50)
Calcium: 9.4 mg/dL (ref 8.4–10.5)
Chloride: 107 mEq/L (ref 96–112)
GFR: 51.72 mL/min — ABNORMAL LOW (ref >60.00)
Glucose, Bld: 86 mg/dL (ref 70–99)
POTASSIUM: 4.1 meq/L (ref 3.5–5.1)
Sodium: 143 mEq/L (ref 135–145)

## 2015-03-21 LAB — PSA: PSA: 3.25 ng/mL (ref 0.10–4.00)

## 2015-04-03 ENCOUNTER — Other Ambulatory Visit (INDEPENDENT_AMBULATORY_CARE_PROVIDER_SITE_OTHER): Payer: Commercial Managed Care - HMO

## 2015-04-03 DIAGNOSIS — N400 Enlarged prostate without lower urinary tract symptoms: Secondary | ICD-10-CM | POA: Diagnosis not present

## 2015-04-03 LAB — URINALYSIS, ROUTINE W REFLEX MICROSCOPIC
Bilirubin Urine: NEGATIVE
Ketones, ur: NEGATIVE
LEUKOCYTES UA: NEGATIVE
NITRITE: NEGATIVE
Specific Gravity, Urine: 1.015 (ref 1.000–1.030)
TOTAL PROTEIN, URINE-UPE24: NEGATIVE
URINE GLUCOSE: NEGATIVE
Urobilinogen, UA: 0.2 (ref 0.0–1.0)
pH: 5.5 (ref 5.0–8.0)

## 2015-04-09 ENCOUNTER — Other Ambulatory Visit: Payer: Self-pay | Admitting: Family Medicine

## 2015-04-09 DIAGNOSIS — R319 Hematuria, unspecified: Secondary | ICD-10-CM

## 2015-04-09 DIAGNOSIS — N138 Other obstructive and reflux uropathy: Secondary | ICD-10-CM

## 2015-04-09 DIAGNOSIS — N401 Enlarged prostate with lower urinary tract symptoms: Secondary | ICD-10-CM

## 2015-04-24 ENCOUNTER — Encounter: Payer: Self-pay | Admitting: Urology

## 2015-04-24 ENCOUNTER — Ambulatory Visit (INDEPENDENT_AMBULATORY_CARE_PROVIDER_SITE_OTHER): Payer: Commercial Managed Care - HMO | Admitting: Urology

## 2015-04-24 VITALS — BP 143/83 | HR 62 | Ht 69.0 in | Wt 178.9 lb

## 2015-04-24 DIAGNOSIS — Z125 Encounter for screening for malignant neoplasm of prostate: Secondary | ICD-10-CM | POA: Insufficient documentation

## 2015-04-24 DIAGNOSIS — N138 Other obstructive and reflux uropathy: Secondary | ICD-10-CM

## 2015-04-24 DIAGNOSIS — R319 Hematuria, unspecified: Secondary | ICD-10-CM | POA: Diagnosis not present

## 2015-04-24 DIAGNOSIS — N401 Enlarged prostate with lower urinary tract symptoms: Secondary | ICD-10-CM

## 2015-04-24 LAB — MICROSCOPIC EXAMINATION
Bacteria, UA: NONE SEEN
Epithelial Cells (non renal): NONE SEEN /hpf (ref 0–10)
WBC, UA: NONE SEEN /hpf (ref 0–?)

## 2015-04-24 LAB — URINALYSIS, COMPLETE
Bilirubin, UA: NEGATIVE
Glucose, UA: NEGATIVE
KETONES UA: NEGATIVE
Leukocytes, UA: NEGATIVE
NITRITE UA: NEGATIVE
Protein, UA: NEGATIVE
SPEC GRAV UA: 1.015 (ref 1.005–1.030)
UUROB: 0.2 mg/dL (ref 0.2–1.0)
pH, UA: 5 (ref 5.0–7.5)

## 2015-04-24 LAB — BLADDER SCAN AMB NON-IMAGING: SCAN RESULT: 20

## 2015-04-24 NOTE — Progress Notes (Signed)
Bladder Scan Patient void: 19ml Performed By: Larna Daughters

## 2015-04-24 NOTE — Progress Notes (Signed)
04/24/2015 3:16 PM   Albert Giles 12-14-1945 [id number]  Referring provider: [name], MD [address] [place], [place] [address]  Chief Complaint  Patient presents with  . New Patient (Initial Visit)    Hematuria, BPH    HPI:  1 - Microscopic Hematuria - blood on UA noted by PCP x several and remains present here. No gross episodes. Former 30PY smoker. He is on plavix.  2 - Prostate Screening - PSA 3.25 / DRE 45gm smooth at age 70 as part of eval above. No FHX prostate cancer.  3 - Urinary Urgency, Weak Stream - pt with slowly progressive bother form mix of obstructive and irritative symptoms. DRE 2017 45gm. Placed on daily tamsulosin by PCP and very satisfied. PVR 2017 "28mL" / normal.  PMH sig for right inguinal hernia repair, CAD/Stent/Plavix (follows dr. Ubaldo Glassing cards). His PCP is [name] MD.  Today "Albert Giles" is seen as new patient for above. He is referred by Dr. Danise Mina.   PMH: Past Medical History  Diagnosis Date  . Emphysema     mild  . Ex-smoker   . CAD (coronary artery disease) 06/2014    UA with NSTEMI - cath with 99% prox L circ s/p stent, EF 40% ([facility], [place] [facility])    Surgical History: Past Surgical History  Procedure Laterality Date  . Inguinal hernia repair Right 08/17/04  . Cystectomy      on back  . Cardiac catheterization N/A 07/14/2014    Left Heart Cath and Coronary Angiography with stent placement;  Surgeon: Teodoro Spray, MD  . Cardiac catheterization N/A 07/14/2014    Coronary Stent Intervention;  Surgeon: Yolonda Kida, MD    Home Medications:    Medication List       This list is accurate as of: 04/24/15  3:16 PM.  Always use your most recent med list.               aspirin EC 81 MG tablet  Take 1 tablet (81 mg total) by mouth daily.     atorvastatin 40 MG tablet  Commonly known as:  LIPITOR  Take 1 tablet (40 mg total) by mouth daily at 6 PM.     clopidogrel 75 MG tablet  Commonly known as:   PLAVIX  Take 1 tablet (75 mg total) by mouth daily.     metoprolol tartrate 25 MG tablet  Commonly known as:  LOPRESSOR  Take 1 tablet (25 mg total) by mouth 2 (two) times daily.     tamsulosin 0.4 MG Caps capsule  Commonly known as:  FLOMAX  Take 1 capsule (0.4 mg total) by mouth daily.        Allergies: No Known Allergies  Family History: Family History  Problem Relation Age of Onset  . Cancer Mother 40    colon  . Cancer Sister     breast  . CAD Neg Hx   . Stroke Neg Hx   . Diabetes Neg Hx   . Prostate cancer Neg Hx     Social History:  reports that he quit smoking about 16 years ago. His smoking use included Cigarettes. He has a 35 pack-year smoking history. He has never used smokeless tobacco. He reports that he does not drink alcohol or use illicit drugs.  ROS: UROLOGY Frequent Urination?: Yes Hard to postpone urination?: No Burning/pain with urination?: No Get up at night to urinate?: Yes Leakage of urine?: No Urine stream starts and stops?:  No Trouble starting stream?: No Do you have to strain to urinate?: No Blood in urine?: No Urinary tract infection?: No Sexually transmitted disease?: No Injury to kidneys or bladder?: No Painful intercourse?: No Weak stream?: No Erection problems?: Yes Penile pain?: No  Gastrointestinal Nausea?: No Vomiting?: No Indigestion/heartburn?: Yes Diarrhea?: No Constipation?: No  Constitutional Fever: No Night sweats?: No Weight loss?: No Fatigue?: No  Skin Skin rash/lesions?: No Itching?: No  Eyes Blurred vision?: No Double vision?: No  Ears/Nose/Throat Sore throat?: No Sinus problems?: No  Hematologic/Lymphatic Swollen glands?: No Easy bruising?: Yes  Cardiovascular Leg swelling?: No Chest pain?: No  Respiratory Cough?: No Shortness of breath?: No  Endocrine Excessive thirst?: No  Musculoskeletal Back pain?: No Joint pain?: No  Neurological Headaches?: No Dizziness?:  No  Psychologic Depression?: No Anxiety?: No  Physical Exam: BP 143/83 mmHg  Pulse 62  Ht 5\' 9"  (1.753 m)  Wt 178 lb 14.4 oz (81.149 kg)  BMI 26.41 kg/m2  Constitutional:  Alert and oriented, No acute distress. HEENT: [place] AT, moist mucus membranes.  Trachea midline, no masses. Cardiovascular: No clubbing, cyanosis, or edema. Respiratory: Normal respiratory effort, no increased work of breathing. GI: Abdomen is soft, nontender, nondistended, no abdominal masses GU: No CVA tenderness. Non-circ'd, phallus straight. Testes down w/o masses. DRE 45gm smooth. Skin: No rashes, bruises or suspicious lesions. Lymph: No cervical or inguinal adenopathy. Neurologic: Grossly intact, no focal deficits, moving all 4 extremities. Psychiatric: Normal mood and affect.  Laboratory Data: Lab Results  Component Value Date   WBC 15.6* 07/15/2014   HGB 12.4* 07/15/2014   HCT 36.7* 07/15/2014   MCV 91.7 07/15/2014   PLT 221 07/15/2014    Lab Results  Component Value Date   CREATININE 1.43 03/20/2015    Lab Results  Component Value Date   PSA 3.25 03/20/2015    No results found for: TESTOSTERONE  Lab Results  Component Value Date   HGBA1C 5.5 07/14/2014    Urinalysis    Component Value Date/Time   COLORURINE YELLOW 04/03/2015 1516   APPEARANCEUR CLEAR 04/03/2015 1516   LABSPEC 1.015 04/03/2015 1516   PHURINE 5.5 04/03/2015 1516   GLUCOSEU NEGATIVE 04/03/2015 1516   HGBUR MODERATE* 04/03/2015 1516   BILIRUBINUR NEGATIVE 04/03/2015 1516   BILIRUBINUR Negative 03/20/2015 1548   KETONESUR NEGATIVE 04/03/2015 1516   PROTEINUR Negative 03/20/2015 1548   UROBILINOGEN 0.2 04/03/2015 1516   UROBILINOGEN 0.2 03/20/2015 1548   NITRITE NEGATIVE 04/03/2015 1516   NITRITE Negative 03/20/2015 1548   LEUKOCYTESUR NEGATIVE 04/03/2015 1516    Pertinent Imaging: None yet  Assessment & Plan:    1 - Microscopic Hematuria - discussed DDX and work-up with labs, imaging, cysto, exam. We both  agreed to proceed with eval especially in setting of smoking history.  2 - Prostate Screening - up to date at age 23. Do not rec further PSA based screening as >70, average risk, and with CV comorbidity.   3 - Urinary Urgency, Weak Stream -likely BPH, doing great on tamsulosin symptomatically and with great emptying. Agree with continuation.  BMP today, RTC for CT and cysto.  Return in about 4 weeks (around 05/22/2015).  Alexis Frock, [facility] [address], [place], [place] [address] [phone]

## 2015-04-25 LAB — BASIC METABOLIC PANEL
BUN/Creatinine Ratio: 11 (ref 10–24)
BUN: 17 mg/dL (ref 8–27)
CO2: 21 mmol/L (ref 18–29)
Calcium: 9.2 mg/dL (ref 8.6–10.2)
Chloride: 101 mmol/L (ref 96–106)
Creatinine, Ser: 1.51 mg/dL — ABNORMAL HIGH (ref 0.76–1.27)
GFR, EST AFRICAN AMERICAN: 53 mL/min/{1.73_m2} — AB (ref >59)
GFR, EST NON AFRICAN AMERICAN: 46 mL/min/{1.73_m2} — AB (ref >59)
Glucose: 93 mg/dL (ref 65–99)
POTASSIUM: 5.1 mmol/L (ref 3.5–5.2)
Sodium: 140 mmol/L (ref 134–144)

## 2015-05-09 ENCOUNTER — Ambulatory Visit
Admission: RE | Admit: 2015-05-09 | Discharge: 2015-05-09 | Disposition: A | Discharge status: Home / Self Care | Payer: Commercial Managed Care - HMO | Source: Ambulatory Visit | Attending: Urology | Admitting: Urology

## 2015-05-09 DIAGNOSIS — R319 Hematuria, unspecified: Secondary | ICD-10-CM | POA: Insufficient documentation

## 2015-05-09 DIAGNOSIS — I700 Atherosclerosis of aorta: Secondary | ICD-10-CM | POA: Insufficient documentation

## 2015-05-09 DIAGNOSIS — N401 Enlarged prostate with lower urinary tract symptoms: Secondary | ICD-10-CM | POA: Diagnosis not present

## 2015-05-09 DIAGNOSIS — K449 Diaphragmatic hernia without obstruction or gangrene: Secondary | ICD-10-CM | POA: Diagnosis not present

## 2015-05-09 MED ORDER — IOPAMIDOL (ISOVUE-300) INJECTION 61%
100.0000 mL | Freq: Once | INTRAVENOUS | Status: AC | PRN
  Administered 2015-05-09: 100 mL via INTRAVENOUS

## 2015-05-22 ENCOUNTER — Other Ambulatory Visit: Payer: Self-pay

## 2015-05-23 ENCOUNTER — Ambulatory Visit (INDEPENDENT_AMBULATORY_CARE_PROVIDER_SITE_OTHER): Payer: Commercial Managed Care - HMO | Admitting: Urology

## 2015-05-23 VITALS — BP 146/82 | HR 67 | Ht 69.0 in | Wt 178.0 lb

## 2015-05-23 DIAGNOSIS — R3129 Other microscopic hematuria: Secondary | ICD-10-CM

## 2015-05-23 LAB — MICROSCOPIC EXAMINATION: BACTERIA UA: NONE SEEN

## 2015-05-23 LAB — URINALYSIS, COMPLETE
Bilirubin, UA: NEGATIVE
Glucose, UA: NEGATIVE
Ketones, UA: NEGATIVE
Leukocytes, UA: NEGATIVE
NITRITE UA: NEGATIVE
Protein, UA: NEGATIVE
Specific Gravity, UA: 1.015 (ref 1.005–1.030)
Urobilinogen, Ur: 0.2 mg/dL (ref 0.2–1.0)
pH, UA: 6 (ref 5.0–7.5)

## 2015-05-23 MED ORDER — CIPROFLOXACIN HCL 500 MG PO TABS
500.0000 mg | ORAL_TABLET | Freq: Once | ORAL | Status: AC
  Administered 2015-05-23: 500 mg via ORAL

## 2015-05-23 MED ORDER — LIDOCAINE HCL 2 % EX GEL
1.0000 "application " | Freq: Once | CUTANEOUS | Status: AC
  Administered 2015-05-23: 1 via URETHRAL

## 2015-05-23 NOTE — Progress Notes (Signed)
05/23/2015 10:08 AM   Albert Giles May 02, 1945 [id number]  Referring provider: [name], MD [address] [place], [place] [address]  Chief Complaint  Patient presents with  . Cysto    HPI:  1 - Microscopic Hematuria - blood on UA noted by PCP x several and remains present here. No gross episodes. Former 30PY smoker. He is on plavix. Eval 2017 wit hCT and cysto with bilobar prostate hypertrophy only.   2 - Prostate Screening - PSA 3.25 / DRE 45gm smooth at age 70 as part of eval above. No FHX prostate cancer.  3 - Urinary Urgency, Weak Stream - pt with slowly progressive bother form mix of obstructive and irritative symptoms. DRE 2017 45gm. Placed on daily tamsulosin by PCP and very satisfied. PVR 2017 "8mL" / normal. Cysto with bilobar hypertrophy / kissing lobes.   PMH sig for right inguinal hernia repair, CAD/Stent/Plavix (follows dr. Ubaldo Glassing cards). His PCP is [name] MD.  Today "[name]" is seen for cysto to complete hematuria eval.     PMH: Past Medical History  Diagnosis Date  . Emphysema     mild  . Ex-smoker   . CAD (coronary artery disease) 06/2014    UA with NSTEMI - cath with 99% prox L circ s/p stent, EF 40% ([facility]ath, [place] at [facility])    Surgical History: Past Surgical History  Procedure Laterality Date  . Inguinal hernia repair Right 08/17/04  . Cystectomy      on back  . Cardiac catheterization N/A 07/14/2014    Left Heart Cath and Coronary Angiography with stent placement;  Surgeon: Teodoro Spray, MD  . Cardiac catheterization N/A 07/14/2014    Coronary Stent Intervention;  Surgeon: Yolonda Kida, MD    Home Medications:    Medication List       This list is accurate as of: 05/23/15 10:08 AM.  Always use your most recent med list.               aspirin EC 81 MG tablet  Take 1 tablet (81 mg total) by mouth daily.     atorvastatin 40 MG tablet  Commonly known as:  LIPITOR  Take 1 tablet (40 mg total) by mouth daily at 6 PM.      clopidogrel 75 MG tablet  Commonly known as:  PLAVIX  Take 1 tablet (75 mg total) by mouth daily.     metoprolol tartrate 25 MG tablet  Commonly known as:  LOPRESSOR  Take 1 tablet (25 mg total) by mouth 2 (two) times daily.     tamsulosin 0.4 MG Caps capsule  Commonly known as:  FLOMAX  Take 1 capsule (0.4 mg total) by mouth daily.        Allergies: No Known Allergies  Family History: Family History  Problem Relation Age of Onset  . Cancer Mother 28    colon  . Cancer Sister     breast  . CAD Neg Hx   . Stroke Neg Hx   . Diabetes Neg Hx   . Prostate cancer Neg Hx     Social History:  reports that he quit smoking about 16 years ago. His smoking use included Cigarettes. He has a 35 pack-year smoking history. He has never used smokeless tobacco. He reports that he does not drink alcohol or use illicit drugs.    Review of Systems  Gastrointestinal (upper)  : Negative for upper GI symptoms  Gastrointestinal (lower) : Negative for lower  GI symptoms  Constitutional : Negative for symptoms  Skin: Negative for skin symptoms  Eyes: Negative for eye symptoms  Ear/Nose/Throat : Negative for Ear/Nose/Throat symptoms  Hematologic/Lymphatic: Negative for Hematologic/Lymphatic symptoms  Cardiovascular : Negative for cardiovascular symptoms  Respiratory : Negative for respiratory symptoms  Endocrine: Negative for endocrine symptoms  Musculoskeletal: Negative for musculoskeletal symptoms  Neurological: Negative for neurological symptoms  Psychologic: Negative for psychiatric symptoms    Physical Exam: BP 146/82 mmHg  Pulse 67  Ht 5\' 9"  (1.753 m)  Wt 178 lb (80.74 kg)  BMI 26.27 kg/m2  Constitutional:  Alert and oriented, No acute distress. HEENT: [place] AT, moist mucus membranes.  Trachea midline, no masses. Cardiovascular: No clubbing, cyanosis, or edema. Respiratory: Normal respiratory effort, no increased work of breathing. GI: Abdomen is  soft, nontender, nondistended, no abdominal masses GU: No CVA tenderness.  Skin: No rashes, bruises or suspicious lesions. Lymph: No cervical or inguinal adenopathy. Neurologic: Grossly intact, no focal deficits, moving all 4 extremities. Psychiatric: Normal mood and affect.  Laboratory Data: Lab Results  Component Value Date   WBC 15.6* 07/15/2014   HGB 12.4* 07/15/2014   HCT 36.7* 07/15/2014   MCV 91.7 07/15/2014   PLT 221 07/15/2014    Lab Results  Component Value Date   CREATININE 1.51* 04/24/2015    Lab Results  Component Value Date   PSA 3.25 03/20/2015    No results found for: TESTOSTERONE  Lab Results  Component Value Date   HGBA1C 5.5 07/14/2014    Urinalysis    Component Value Date/Time   COLORURINE YELLOW 04/03/2015 1516   APPEARANCEUR Clear 04/24/2015 1458   APPEARANCEUR CLEAR 04/03/2015 1516   LABSPEC 1.015 04/03/2015 1516   PHURINE 5.5 04/03/2015 1516   GLUCOSEU Negative 04/24/2015 1458   GLUCOSEU NEGATIVE 04/03/2015 1516   HGBUR MODERATE* 04/03/2015 1516   BILIRUBINUR Negative 04/24/2015 1458   BILIRUBINUR NEGATIVE 04/03/2015 1516   BILIRUBINUR Negative 03/20/2015 1548   KETONESUR NEGATIVE 04/03/2015 1516   PROTEINUR Negative 04/24/2015 1458   PROTEINUR Negative 03/20/2015 1548   UROBILINOGEN 0.2 04/03/2015 1516   UROBILINOGEN 0.2 03/20/2015 1548   NITRITE Negative 04/24/2015 1458   NITRITE NEGATIVE 04/03/2015 1516   NITRITE Negative 03/20/2015 1548   LEUKOCYTESUR Negative 04/24/2015 1458   LEUKOCYTESUR NEGATIVE 04/03/2015 1516       Cystoscopy Procedure Note  Patient identification was confirmed, informed consent was obtained, and patient was prepped using Betadine solution.  Lidocaine jelly was administered per urethral meatus.    Preoperative abx where received prior to procedure.     Pre-Procedure: - Inspection reveals a normal caliber ureteral meatus.  Procedure: The flexible cystoscope was introduced without difficulty -  No urethral strictures/lesions are present. - Enlarged prostate  - Normal bladder neck - Bilateral ureteral orifices identified - Bladder mucosa  reveals no ulcers, tumors, or lesions - No bladder stones - No trabeculation  Retroflexion shows no additional findings   Post-Procedure: - Patient tolerated the procedure well   Assessment & Plan:    1 - Microscopic Hematuria - eval reassuring, likely form BPH. Discussed role of dailiy finasteride and we both agree to hold off unless becomes recurrent / progresses.   2 - Prostate Screening - up to date at age 34, no further screening as average risk.   3 - Urinary Urgency, Weak Stream - improved on tamsulosin, consider adding daily finasteride if bother progresses.  4 - RTC 1 year for symptom check with any provide.r    Return  in about 1 year (around 05/22/2016).  Alexis Frock, [facility] [address], [place], [place] [address] [phone]

## 2015-07-19 ENCOUNTER — Other Ambulatory Visit: Payer: Self-pay | Admitting: Family Medicine

## 2015-11-21 ENCOUNTER — Other Ambulatory Visit: Payer: Self-pay | Admitting: Family Medicine

## 2015-11-28 ENCOUNTER — Ambulatory Visit (INDEPENDENT_AMBULATORY_CARE_PROVIDER_SITE_OTHER): Payer: Commercial Managed Care - HMO

## 2015-11-28 DIAGNOSIS — Z23 Encounter for immunization: Secondary | ICD-10-CM

## 2015-12-19 ENCOUNTER — Other Ambulatory Visit: Payer: Self-pay | Admitting: Family Medicine

## 2015-12-19 DIAGNOSIS — N289 Disorder of kidney and ureter, unspecified: Secondary | ICD-10-CM

## 2015-12-19 DIAGNOSIS — Z1159 Encounter for screening for other viral diseases: Secondary | ICD-10-CM

## 2015-12-19 DIAGNOSIS — E785 Hyperlipidemia, unspecified: Secondary | ICD-10-CM

## 2015-12-25 ENCOUNTER — Other Ambulatory Visit (INDEPENDENT_AMBULATORY_CARE_PROVIDER_SITE_OTHER): Payer: Commercial Managed Care - HMO

## 2015-12-25 DIAGNOSIS — E785 Hyperlipidemia, unspecified: Secondary | ICD-10-CM | POA: Diagnosis not present

## 2015-12-25 DIAGNOSIS — N289 Disorder of kidney and ureter, unspecified: Secondary | ICD-10-CM | POA: Diagnosis not present

## 2015-12-25 DIAGNOSIS — Z1159 Encounter for screening for other viral diseases: Secondary | ICD-10-CM

## 2015-12-25 LAB — CBC WITH DIFFERENTIAL/PLATELET
BASOS ABS: 0.1 10*3/uL (ref 0.0–0.1)
Basophils Relative: 0.6 % (ref 0.0–3.0)
EOS PCT: 3.7 % (ref 0.0–5.0)
Eosinophils Absolute: 0.3 10*3/uL (ref 0.0–0.7)
HCT: 45.5 % (ref 39.0–52.0)
Hemoglobin: 15.4 g/dL (ref 13.0–17.0)
LYMPHS ABS: 2.3 10*3/uL (ref 0.7–4.0)
Lymphocytes Relative: 25.7 % (ref 12.0–46.0)
MCHC: 33.9 g/dL (ref 30.0–36.0)
MCV: 91 fl (ref 78.0–100.0)
MONO ABS: 0.6 10*3/uL (ref 0.1–1.0)
Monocytes Relative: 6.5 % (ref 3.0–12.0)
NEUTROS ABS: 5.6 10*3/uL (ref 1.4–7.7)
NEUTROS PCT: 63.5 % (ref 43.0–77.0)
PLATELETS: 251 10*3/uL (ref 150.0–400.0)
RBC: 5 Mil/uL (ref 4.22–5.81)
RDW: 13.9 % (ref 11.5–15.5)
WBC: 8.9 10*3/uL (ref 4.0–10.5)

## 2015-12-25 LAB — LIPID PANEL
CHOL/HDL RATIO: 3
Cholesterol: 118 mg/dL (ref 0–200)
HDL: 37.2 mg/dL — AB (ref >39.00)
LDL CALC: 50 mg/dL (ref 0–99)
NONHDL: 80.44
Triglycerides: 153 mg/dL — ABNORMAL HIGH (ref 0.0–149.0)
VLDL: 30.6 mg/dL (ref 0.0–40.0)

## 2015-12-25 LAB — RENAL FUNCTION PANEL
ALBUMIN: 4.3 g/dL (ref 3.5–5.2)
BUN: 15 mg/dL (ref 6–23)
CALCIUM: 9.8 mg/dL (ref 8.4–10.5)
CO2: 26 mEq/L (ref 19–32)
CREATININE: 1.65 mg/dL — AB (ref 0.40–1.50)
Chloride: 103 mEq/L (ref 96–112)
GFR: 43.75 mL/min — ABNORMAL LOW (ref >60.00)
GLUCOSE: 101 mg/dL — AB (ref 70–99)
POTASSIUM: 4.3 meq/L (ref 3.5–5.1)
Phosphorus: 3.3 mg/dL (ref 2.3–4.6)
Sodium: 137 mEq/L (ref 135–145)

## 2015-12-26 LAB — HEPATITIS C ANTIBODY: HCV Ab: NEGATIVE

## 2016-01-01 ENCOUNTER — Ambulatory Visit (INDEPENDENT_AMBULATORY_CARE_PROVIDER_SITE_OTHER): Payer: Commercial Managed Care - HMO | Admitting: Family Medicine

## 2016-01-01 ENCOUNTER — Encounter: Payer: Self-pay | Admitting: Family Medicine

## 2016-01-01 VITALS — BP 120/80 | HR 64 | Ht 67.5 in | Wt 180.0 lb

## 2016-01-01 DIAGNOSIS — Z800 Family history of malignant neoplasm of digestive organs: Secondary | ICD-10-CM

## 2016-01-01 DIAGNOSIS — Z7189 Other specified counseling: Secondary | ICD-10-CM

## 2016-01-01 DIAGNOSIS — I25110 Atherosclerotic heart disease of native coronary artery with unstable angina pectoris: Secondary | ICD-10-CM

## 2016-01-01 DIAGNOSIS — N183 Chronic kidney disease, stage 3 unspecified: Secondary | ICD-10-CM

## 2016-01-01 DIAGNOSIS — N138 Other obstructive and reflux uropathy: Secondary | ICD-10-CM

## 2016-01-01 DIAGNOSIS — Z0000 Encounter for general adult medical examination without abnormal findings: Secondary | ICD-10-CM | POA: Diagnosis not present

## 2016-01-01 DIAGNOSIS — N401 Enlarged prostate with lower urinary tract symptoms: Secondary | ICD-10-CM

## 2016-01-01 DIAGNOSIS — Z23 Encounter for immunization: Secondary | ICD-10-CM

## 2016-01-01 LAB — MICROALBUMIN / CREATININE URINE RATIO
Creatinine,U: 74 mg/dL
Microalb Creat Ratio: 0.9 mg/g (ref 0.0–30.0)
Microalb, Ur: <0.7 mg/dL (ref 0.0–1.9)

## 2016-01-01 NOTE — Progress Notes (Signed)
Pre visit review using our clinic review tool, if applicable. No additional management support is needed unless otherwise documented below in the visit note. 

## 2016-01-01 NOTE — Assessment & Plan Note (Signed)
Preventative protocols reviewed and updated unless pt declined. Discussed healthy diet and lifestyle.  

## 2016-01-01 NOTE — Assessment & Plan Note (Signed)
iFOB today as patient declines colonoscopy, aware direct visualization is best.

## 2016-01-01 NOTE — Assessment & Plan Note (Signed)
Noted 2016, progressively worsening. Check microalb today. Reviewed stable recent CT abd/pelvis with/without contrast. RTC 3 mo recheck labs. Discussed avoidance of NSAIDs and good hydration status.

## 2016-01-01 NOTE — Assessment & Plan Note (Signed)
Appreciate cardiology care - continue DAPT, statin, beta blocker.

## 2016-01-01 NOTE — Assessment & Plan Note (Signed)
Stable on flomax - continue.

## 2016-01-01 NOTE — Patient Instructions (Addendum)
Pass by lab to pick up stool kit.  Pneumovax today. Look into advanced directive packet.  Kidney function is a bit worse than last year. Return in 3 months for repeat labs only. Urine for microalbumin today.  Return in 6 months for follow up visit.   Health Maintenance, Male A healthy lifestyle and preventative care can promote health and wellness.  Maintain regular health, dental, and eye exams.  Eat a healthy diet. Foods like vegetables, fruits, whole grains, low-fat dairy products, and lean protein foods contain the nutrients you need and are low in calories. Decrease your intake of foods high in solid fats, added sugars, and salt. Get information about a proper diet from your health care provider, if necessary.  Regular physical exercise is one of the most important things you can do for your health. Most adults should get at least 150 minutes of moderate-intensity exercise (any activity that increases your heart rate and causes you to sweat) each week. In addition, most adults need muscle-strengthening exercises on 2 or more days a week.   Maintain a healthy weight. The body mass index (BMI) is a screening tool to identify possible weight problems. It provides an estimate of body fat based on height and weight. Your health care provider can find your BMI and can help you achieve or maintain a healthy weight. For males 20 years and older:  A BMI below 18.5 is considered underweight.  A BMI of 18.5 to 24.9 is normal.  A BMI of 25 to 29.9 is considered overweight.  A BMI of 30 and above is considered obese.  Maintain normal blood lipids and cholesterol by exercising and minimizing your intake of saturated fat. Eat a balanced diet with plenty of fruits and vegetables. Blood tests for lipids and cholesterol should begin at age 26 and be repeated every 5 years. If your lipid or cholesterol levels are high, you are over age 57, or you are at high risk for heart disease, you may need your  cholesterol levels checked more frequently.Ongoing high lipid and cholesterol levels should be treated with medicines if diet and exercise are not working.  If you smoke, find out from your health care provider how to quit. If you do not use tobacco, do not start.  Lung cancer screening is recommended for adults aged 53-80 years who are at high risk for developing lung cancer because of a history of smoking. A yearly low-dose CT scan of the lungs is recommended for people who have at least a 30-pack-year history of smoking and are current smokers or have quit within the past 15 years. A pack year of smoking is smoking an average of 1 pack of cigarettes a day for 1 year (for example, a 30-pack-year history of smoking could mean smoking 1 pack a day for 30 years or 2 packs a day for 15 years). Yearly screening should continue until the smoker has stopped smoking for at least 15 years. Yearly screening should be stopped for people who develop a health problem that would prevent them from having lung cancer treatment.  If you choose to drink alcohol, do not have more than 2 drinks per day. One drink is considered to be 12 oz (360 mL) of beer, 5 oz (150 mL) of wine, or 1.5 oz (45 mL) of liquor.  Avoid the use of street drugs. Do not share needles with anyone. Ask for help if you need support or instructions about stopping the use of drugs.  High blood  pressure causes heart disease and increases the risk of stroke. High blood pressure is more likely to develop in:  People who have blood pressure in the end of the normal range (100-139/85-89 mm Hg).  People who are overweight or obese.  People who are African American.  If you are 53-53 years of age, have your blood pressure checked every 3-5 years. If you are 62 years of age or older, have your blood pressure checked every year. You should have your blood pressure measured twice-once when you are at a hospital or clinic, and once when you are not at a  hospital or clinic. Record the average of the two measurements. To check your blood pressure when you are not at a hospital or clinic, you can use:  An automated blood pressure machine at a pharmacy.  A home blood pressure monitor.  If you are 2-79 years old, ask your health care provider if you should take aspirin to prevent heart disease.  Diabetes screening involves taking a blood sample to check your fasting blood sugar level. This should be done once every 3 years after age 21 if you are at a normal weight and without risk factors for diabetes. Testing should be considered at a younger age or be carried out more frequently if you are overweight and have at least 1 risk factor for diabetes.  Colorectal cancer can be detected and often prevented. Most routine colorectal cancer screening begins at the age of 1 and continues through age 2. However, your health care provider may recommend screening at an earlier age if you have risk factors for colon cancer. On a yearly basis, your health care provider may provide home test kits to check for hidden blood in the stool. A small camera at the end of a tube may be used to directly examine the colon (sigmoidoscopy or colonoscopy) to detect the earliest forms of colorectal cancer. Talk to your health care provider about this at age 41 when routine screening begins. A direct exam of the colon should be repeated every 5-10 years through age 35, unless early forms of precancerous polyps or small growths are found.  People who are at an increased risk for hepatitis B should be screened for this virus. You are considered at high risk for hepatitis B if:  You were born in a country where hepatitis B occurs often. Talk with your health care provider about which countries are considered high risk.  Your parents were born in a high-risk country and you have not received a shot to protect against hepatitis B (hepatitis B vaccine).  You have HIV or AIDS.  You  use needles to inject street drugs.  You live with, or have sex with, someone who has hepatitis B.  You are a man who has sex with other men (MSM).  You get hemodialysis treatment.  You take certain medicines for conditions like cancer, organ transplantation, and autoimmune conditions.  Hepatitis C blood testing is recommended for all people born from [birth year] through 1967 and any individual with known risk factors for hepatitis C.  Healthy men should no longer receive prostate-specific antigen (PSA) blood tests as part of routine cancer screening. Talk to your health care provider about prostate cancer screening.  Testicular cancer screening is not recommended for adolescents or adult males who have no symptoms. Screening includes self-exam, a health care provider exam, and other screening tests. Consult with your health care provider about any symptoms you have or any concerns you  have about testicular cancer.  Practice safe sex. Use condoms and avoid high-risk sexual practices to reduce the spread of sexually transmitted infections (STIs).  You should be screened for STIs, including gonorrhea and chlamydia if:  You are sexually active and are younger than 24 years.  You are older than 24 years, and your health care provider tells you that you are at risk for this type of infection.  Your sexual activity has changed since you were last screened, and you are at an increased risk for chlamydia or gonorrhea. Ask your health care provider if you are at risk.  If you are at risk of being infected with HIV, it is recommended that you take a prescription medicine daily to prevent HIV infection. This is called pre-exposure prophylaxis (PrEP). You are considered at risk if:  You are a man who has sex with other men (MSM).  You are a heterosexual man who is sexually active with multiple partners.  You take drugs by injection.  You are sexually active with a partner who has HIV.  Talk with  your health care provider about whether you are at high risk of being infected with HIV. If you choose to begin PrEP, you should first be tested for HIV. You should then be tested every 3 months for as long as you are taking PrEP.  Use sunscreen. Apply sunscreen liberally and repeatedly throughout the day. You should seek shade when your shadow is shorter than you. Protect yourself by wearing long sleeves, pants, a wide-brimmed hat, and sunglasses year round whenever you are outdoors.  Tell your health care provider of new moles or changes in moles, especially if there is a change in shape or color. Also, tell your health care provider if a mole is larger than the size of a pencil eraser.  A one-time screening for abdominal aortic aneurysm (AAA) and surgical repair of large AAAs by ultrasound is recommended for men aged 54-75 years who are current or former smokers.  Stay current with your vaccines (immunizations). This information is not intended to replace advice given to you by your health care provider. Make sure you discuss any questions you have with your health care provider. Document Released: 06/29/2007 Document Revised: 01/21/2014 Document Reviewed: 10/04/2014 Elsevier Interactive Patient Education  2017 [employer].

## 2016-01-01 NOTE — Assessment & Plan Note (Addendum)
Advanced directives - doesn't have this set up. Would want wife then 2 daughters. Has packet at home - encouraged he fill this out.  

## 2016-01-01 NOTE — Assessment & Plan Note (Signed)

## 2016-01-01 NOTE — Progress Notes (Signed)
BP 120/80   Pulse 64   Ht 5' 7.5" (1.715 m)   Wt 180 lb (81.6 kg)   SpO2 94%   BMI 27.78 kg/m    CC: CPE/AMW Subjective:    Patient ID: Albert Giles, male    DOB: July 10, 1945, 70 y.o.   MRN: [id number]  HPI: Albert Giles is a 70 y.o. male presenting on 01/01/2016 for Medicare Wellness   Followed by Dr Ubaldo Glassing for CAD on DAPT, statin, beta blocker. BPH - "50-60%" better on flomax.   Vision screen failed L>R Hearing screen passed No falls No mood issues/depression/anhedonia.   Preventative: Colon cancer screening - does have fmhx. Declines colonoscopy. Requests rpt today.  Prostate cancer screening - known BPH - last check 03/2015 stable. rec against continued screening by urology.  Flu - today Prevnar 2015, pneumovax today Td 2009 zostavax - declines Advanced directives - doesn't have this set up. Would want wife then 2 daughters. Has packet at home - encouraged he fill this out. Seat belt use discussed Sunscreen use discussed. No changing moles on skin. Ex smoker Alcohol - none  Caffeine: 2 cups coffee, 2 cups tea/day Lives with wife Occupation: Higher education careers adviser, retired Edu: HS Activity: works in yard, walking 1 mi/day Diet: some water, fruits/vegetables daily  Relevant past medical, surgical, family and social history reviewed and updated as indicated. Interim medical history since our last visit reviewed. Allergies and medications reviewed and updated. Current Outpatient Prescriptions on File Prior to Visit  Medication Sig  . aspirin EC 81 MG tablet Take 1 tablet (81 mg total) by mouth daily.  Marland Kitchen atorvastatin (LIPITOR) 40 MG tablet Take 1 tablet (40 mg total) by mouth daily at 6 PM.  . clopidogrel (PLAVIX) 75 MG tablet Take 1 tablet (75 mg total) by mouth daily.  . metoprolol tartrate (LOPRESSOR) 25 MG tablet Take 1 tablet (25 mg total) by mouth 2 (two) times daily.  . tamsulosin (FLOMAX) 0.4 MG CAPS capsule TAKE ONE CAPSULE BY MOUTH ONCE DAILY   No current  facility-administered medications on file prior to visit.     Review of Systems  Constitutional: Negative for activity change, appetite change, chills, fatigue, fever and unexpected weight change.  HENT: Negative for hearing loss.   Eyes: Negative for visual disturbance.  Respiratory: Negative for cough, chest tightness, shortness of breath and wheezing.   Cardiovascular: Negative for chest pain, palpitations and leg swelling.  Gastrointestinal: Negative for abdominal distention, abdominal pain, blood in stool, constipation, diarrhea, nausea and vomiting.  Genitourinary: Negative for difficulty urinating and hematuria.  Musculoskeletal: Negative for arthralgias, myalgias and neck pain.  Skin: Negative for rash.  Neurological: Negative for dizziness, seizures, syncope and headaches.  Hematological: Negative for adenopathy. Does not bruise/bleed easily.  Psychiatric/Behavioral: Negative for dysphoric mood. The patient is not nervous/anxious.    Per HPI unless specifically indicated in ROS section     Objective:    BP 120/80   Pulse 64   Ht 5' 7.5" (1.715 m)   Wt 180 lb (81.6 kg)   SpO2 94%   BMI 27.78 kg/m   Wt Readings from Last 3 Encounters:  01/01/16 180 lb (81.6 kg)  05/23/15 178 lb (80.7 kg)  04/24/15 178 lb 14.4 oz (81.1 kg)    Physical Exam  Constitutional: He is oriented to person, place, and time. He appears well-developed and well-nourished. No distress.  HENT:  Head: Normocephalic and atraumatic.  Right Ear: Hearing, tympanic membrane, external ear and ear canal normal.  Left Ear: Hearing, tympanic membrane, external ear and ear canal normal.  Nose: Nose normal.  Mouth/Throat: Uvula is midline, oropharynx is clear and moist and mucous membranes are normal. No oropharyngeal exudate, posterior oropharyngeal edema or posterior oropharyngeal erythema.  Eyes: Conjunctivae and EOM are normal. Pupils are equal, round, and reactive to light. No scleral icterus.  Neck:  Normal range of motion. Neck supple. Carotid bruit is not present. No thyromegaly present.  Cardiovascular: Normal rate, regular rhythm, normal heart sounds and intact distal pulses.   No murmur heard. Pulses:      Radial pulses are 2+ on the right side, and 2+ on the left side.  Pulmonary/Chest: Effort normal and breath sounds normal. No respiratory distress. He has no wheezes. He has no rales.  Abdominal: Soft. Bowel sounds are normal. He exhibits no distension and no mass. There is no tenderness. There is no rebound and no guarding.  Musculoskeletal: Normal range of motion. He exhibits no edema.  Lymphadenopathy:    He has no cervical adenopathy.  Neurological: He is alert and oriented to person, place, and time.  CN grossly intact, station and gait intact Recall 3/3 Calculation 5/5 serial 3s  Skin: Skin is warm and dry. No rash noted.  Psychiatric: He has a normal mood and affect. His behavior is normal. Judgment and thought content normal.  Nursing note and vitals reviewed.  Results for orders placed or performed in visit on 12/25/15  Lipid panel  Result Value Ref Range   Cholesterol 118 0 - 200 mg/dL   Triglycerides 153.0 (H) 0.0 - 149.0 mg/dL   HDL 37.20 (L) >39.00 mg/dL   VLDL 30.6 0.0 - 40.0 mg/dL   LDL Cholesterol 50 0 - 99 mg/dL   Total CHOL/HDL Ratio 3    NonHDL 80.44   Renal function panel  Result Value Ref Range   Sodium 137 135 - 145 mEq/L   Potassium 4.3 3.5 - 5.1 mEq/L   Chloride 103 96 - 112 mEq/L   CO2 26 19 - 32 mEq/L   Calcium 9.8 8.4 - 10.5 mg/dL   Albumin 4.3 3.5 - 5.2 g/dL   BUN 15 6 - 23 mg/dL   Creatinine, Ser 1.65 (H) 0.40 - 1.50 mg/dL   Glucose, Bld 101 (H) 70 - 99 mg/dL   Phosphorus 3.3 2.3 - 4.6 mg/dL   GFR 43.75 (L) >60.00 mL/min  CBC with Differential/Platelet  Result Value Ref Range   WBC 8.9 4.0 - 10.5 K/uL   RBC 5.00 4.22 - 5.81 Mil/uL   Hemoglobin 15.4 13.0 - 17.0 g/dL   HCT 45.5 39.0 - 52.0 %   MCV 91.0 78.0 - 100.0 fl   MCHC 33.9  30.0 - 36.0 g/dL   RDW 13.9 11.5 - 15.5 %   Platelets 251.0 150.0 - 400.0 K/uL   Neutrophils Relative % 63.5 43.0 - 77.0 %   Lymphocytes Relative 25.7 12.0 - 46.0 %   Monocytes Relative 6.5 3.0 - 12.0 %   Eosinophils Relative 3.7 0.0 - 5.0 %   Basophils Relative 0.6 0.0 - 3.0 %   Neutro Abs 5.6 1.4 - 7.7 K/uL   Lymphs Abs 2.3 0.7 - 4.0 K/uL   Monocytes Absolute 0.6 0.1 - 1.0 K/uL   Eosinophils Absolute 0.3 0.0 - 0.7 K/uL   Basophils Absolute 0.1 0.0 - 0.1 K/uL  Hepatitis C antibody  Result Value Ref Range   HCV Ab NEGATIVE NEGATIVE   Lab Results  Component Value Date   PSA  3.25 03/20/2015       Assessment & Plan:   Problem List Items Addressed This Visit    Advanced care planning/counseling discussion    Advanced directives - doesn't have this set up. Would want wife then 2 daughters. Has packet at home - encouraged he fill this out.      BPH with obstruction/lower urinary tract symptoms    Stable on flomax - continue.       CAD (coronary artery disease)    Appreciate cardiology care - continue DAPT, statin, beta blocker.       CKD (chronic kidney disease) stage 3, GFR 30-59 ml/min    Noted 2016, progressively worsening. Check microalb today. Reviewed stable recent CT abd/pelvis with/without contrast. RTC 3 mo recheck labs. Discussed avoidance of NSAIDs and good hydration status.       Relevant Orders   Microalbumin / creatinine urine ratio   TSH   Serum protein electrophoresis with reflex   Comprehensive metabolic panel   Family history of colon cancer    iFOB today as patient declines colonoscopy, aware direct visualization is best.       Health maintenance examination    Preventative protocols reviewed and updated unless pt declined. Discussed healthy diet and lifestyle.       Medicare annual wellness visit, subsequent - Primary    I have personally reviewed the Medicare Annual Wellness questionnaire and have noted 1. The patient's medical and social  history 2. Their use of alcohol, tobacco or illicit drugs 3. Their current medications and supplements 4. The patient's functional ability including ADL's, fall risks, home safety risks and hearing or visual impairment. Cognitive function has been assessed and addressed as indicated.  5. Diet and physical activity 6. Evidence for depression or mood disorders The patients weight, height, BMI have been recorded in the chart. I have made referrals, counseling and provided education to the patient based on review of the above and I have provided the pt with a written personalized care plan for preventive services. Provider list updated.. See scanned questionairre as needed for further documentation. Reviewed preventative protocols and updated unless pt declined.        Other Visit Diagnoses    Need for prophylactic vaccination against Streptococcus pneumoniae (pneumococcus)       Relevant Orders   Pneumococcal polysaccharide vaccine 23-valent greater than or equal to 2yo subcutaneous/IM (Completed)       Follow up plan: Return in about 6 months (around 07/01/2016) for annual exam, prior fasting for blood work, medicare wellness visit.  [name], MD

## 2016-01-09 ENCOUNTER — Other Ambulatory Visit (INDEPENDENT_AMBULATORY_CARE_PROVIDER_SITE_OTHER): Payer: Commercial Managed Care - HMO

## 2016-01-09 ENCOUNTER — Other Ambulatory Visit: Payer: Self-pay | Admitting: Family Medicine

## 2016-01-09 ENCOUNTER — Encounter: Payer: Self-pay | Admitting: *Deleted

## 2016-01-09 DIAGNOSIS — Z1211 Encounter for screening for malignant neoplasm of colon: Secondary | ICD-10-CM

## 2016-01-09 LAB — FECAL OCCULT BLOOD, IMMUNOCHEMICAL: FECAL OCCULT BLD: NEGATIVE

## 2016-01-09 LAB — FECAL OCCULT BLOOD, GUAIAC: FECAL OCCULT BLD: NEGATIVE

## 2016-05-14 DIAGNOSIS — I2510 Atherosclerotic heart disease of native coronary artery without angina pectoris: Secondary | ICD-10-CM | POA: Diagnosis not present

## 2016-05-14 DIAGNOSIS — Z9861 Coronary angioplasty status: Secondary | ICD-10-CM | POA: Diagnosis not present

## 2016-05-18 ENCOUNTER — Other Ambulatory Visit: Payer: Self-pay | Admitting: Family Medicine

## 2016-05-21 NOTE — Progress Notes (Deleted)
05/22/2016 11:56 AM   Albert Giles 28-May-1945 [id number]  Referring provider: [name], MD [place], [place] [address]  No chief complaint on file.   HPI: 71 yo WM with a history of hematuria and BPH with LU TS who presents today for a one year follow up.  History of hematuria Blood on UA noted by PCP x several and remains present here.  No gross episodes. Former 30PY smoker. He is on plavix. Eval 2017 with CTU and cysto with bilobar prostate hypertrophy only.  UA today ***.    BPH WITH LUTS His IPSS score today is ***, which is *** lower urinary tract symptomatology. He is *** with his quality life due to his urinary symptoms. His PVR is *** mL.  His previous IPSS score was ***.  His previous PVR is *** mL.    His major complaint today ***.  He has had these symptoms for *** years.  He denies any dysuria, hematuria or suprapubic pain.   He currently taking ***.  His has had ***.  Previous PSA's:     He also denies any recent fevers, chills, nausea or vomiting.  He has a family history of PCa, with ***.   He does not have a family history of PCa.***    Score:  1-7 Mild 8-19 Moderate 20-35 Severe   PMH: Past Medical History:  Diagnosis Date  . CAD (coronary artery disease) 06/2014   UA with NSTEMI - cath with 99% prox L circ s/p stent, EF 40% ([place], [place] [facility])  . Emphysema    mild  . Ex-smoker   . NSTEMI (non-ST elevated myocardial infarction) ([place]) 07/13/2014    Surgical History: Past Surgical History:  Procedure Laterality Date  . CARDIAC CATHETERIZATION N/A 07/14/2014   Left Heart Cath and Coronary Angiography with stent placement;  Surgeon: Teodoro Spray, MD  . CARDIAC CATHETERIZATION N/A 07/14/2014   Coronary Stent Intervention;  Surgeon: Yolonda Kida, MD  . CYSTECTOMY     on back  . INGUINAL HERNIA REPAIR Right 08/17/04    Home Medications:  Allergies as of 05/22/2016   No Known Allergies     Medication List         Accurate as of 05/21/16 11:56 AM. Always use your most recent med list.          aspirin EC 81 MG tablet Take 1 tablet (81 mg total) by mouth daily.   atorvastatin 40 MG tablet Commonly known as:  LIPITOR Take 1 tablet (40 mg total) by mouth daily at 6 PM.   clopidogrel 75 MG tablet Commonly known as:  PLAVIX Take 1 tablet (75 mg total) by mouth daily.   metoprolol tartrate 25 MG tablet Commonly known as:  LOPRESSOR Take 1 tablet (25 mg total) by mouth 2 (two) times daily.   tamsulosin 0.4 MG Caps capsule Commonly known as:  FLOMAX TAKE ONE CAPSULE BY MOUTH ONCE DAILY       Allergies: No Known Allergies  Family History: Family History  Problem Relation Age of Onset  . Cancer Mother 69    colon  . Cancer Sister     breast  . CAD Neg Hx   . Stroke Neg Hx   . Diabetes Neg Hx   . Prostate cancer Neg Hx     Social History:  reports that he quit smoking about 17 years ago. His smoking use included Cigarettes. He has a 35.00 pack-year smoking history.  He has never used smokeless tobacco. He reports that he does not drink alcohol or use drugs.  ROS:                                        Physical Exam: There were no vitals taken for this visit.  Constitutional:  Alert and oriented, No acute distress. HEENT: [place] AT, moist mucus membranes.  Trachea midline, no masses. Cardiovascular: No clubbing, cyanosis, or edema. Respiratory: Normal respiratory effort, no increased work of breathing. GI: Abdomen is soft, nontender, nondistended, no abdominal masses GU: No CVA tenderness. Non-circ'd, phallus straight. Testes down w/o masses. DRE 45gm smooth. Skin: No rashes, bruises or suspicious lesions. Lymph: No cervical or inguinal adenopathy. Neurologic: Grossly intact, no focal deficits, moving all 4 extremities. Psychiatric: Normal mood and affect.  Laboratory Data: Lab Results  Component Value Date   WBC 8.9 12/25/2015   HGB 15.4 12/25/2015    HCT 45.5 12/25/2015   MCV 91.0 12/25/2015   PLT 251.0 12/25/2015    Lab Results  Component Value Date   CREATININE 1.65 (H) 12/25/2015    Lab Results  Component Value Date   PSA 3.25 03/20/2015    Lab Results  Component Value Date   HGBA1C 5.5 07/14/2014    Urinalysis ***   Assessment & Plan:    1.  History of hematuria  - completed workup in 2017 with CTU and cystoscopy - no worrisome findings - bilobar prostate hypertrophy   - UA today ***  2. BPH with LUTS  - IPSS score is ***, it is stable/improving/worsening  - Continue conservative management, avoiding bladder irritants and timed voiding's  - most bothersome symptoms is/are ***  - Initiate alpha-blocker (***), discussed side effects ***  - Initiate 5 alpha reductase inhibitor (***), discussed side effects ***  - Continue tamsulosin 0.4 mg daily, alfuzosin 10 mg daily, Rapaflo 8 mg daily, terazosin, doxazosin, Cialis 5 mg daily and finasteride 5 mg daily, dutasteride 0.5 mg daily***:refills given  - Cannot tolerate medication or medication failure, schedule cystoscopy ***  - RTC in *** months for IPSS, PSA, PVR and exam     No Follow-up on file.  Zara Council, [facility] [address] [place], [place] [address] [phone]

## 2016-05-22 ENCOUNTER — Ambulatory Visit: Payer: Self-pay | Admitting: Urology

## 2016-05-22 ENCOUNTER — Encounter: Payer: Self-pay | Admitting: Urology

## 2016-05-30 ENCOUNTER — Encounter: Payer: Self-pay | Admitting: Urology

## 2016-05-30 ENCOUNTER — Ambulatory Visit (INDEPENDENT_AMBULATORY_CARE_PROVIDER_SITE_OTHER): Payer: Medicare Other | Admitting: Urology

## 2016-05-30 VITALS — BP 143/94 | HR 69 | Ht 69.0 in | Wt 180.4 lb

## 2016-05-30 DIAGNOSIS — Z87448 Personal history of other diseases of urinary system: Secondary | ICD-10-CM | POA: Diagnosis not present

## 2016-05-30 DIAGNOSIS — N138 Other obstructive and reflux uropathy: Secondary | ICD-10-CM

## 2016-05-30 DIAGNOSIS — N401 Enlarged prostate with lower urinary tract symptoms: Secondary | ICD-10-CM | POA: Diagnosis not present

## 2016-05-30 DIAGNOSIS — N400 Enlarged prostate without lower urinary tract symptoms: Secondary | ICD-10-CM | POA: Diagnosis not present

## 2016-05-30 LAB — URINALYSIS, COMPLETE
BILIRUBIN UA: NEGATIVE
Glucose, UA: NEGATIVE
Ketones, UA: NEGATIVE
LEUKOCYTES UA: NEGATIVE
Nitrite, UA: NEGATIVE
Protein, UA: NEGATIVE
Specific Gravity, UA: 1.015 (ref 1.005–1.030)
UUROB: 0.2 mg/dL (ref 0.2–1.0)
pH, UA: 5 (ref 5.0–7.5)

## 2016-05-30 NOTE — Progress Notes (Signed)
05/30/2016 3:36 PM   Albert Giles [birth year] [id number]  Referring provider: [name], MD [address] [place], [place] [address]  Chief Complaint  Patient presents with  . Benign Prostatic Hypertrophy    1 year follow up  . Hematuria    micro    HPI: 71 yo WM with a history of hematuria and BPH with LU TS who presents today for a one year follow up.  History of hematuria Blood on UA noted by PCP x several and remains present here.  No gross episodes. Former 30PY smoker. He is on plavix. Eval 2017 with CTU and cysto with bilobar prostate hypertrophy only.  UA today was unremarkable  BPH WITH LUTS His IPSS score today is 5, which is mild lower urinary tract symptomatology. He is mostly satisfied with his quality life due to his urinary symptoms.   His major complaint today is nocturia x 1.  He has had these symptoms for several years.  He denies any dysuria, hematuria or suprapubic pain.   He currently taking tamsulosin 0.4 mg daily.   He also denies any recent fevers, chills, nausea or vomiting.     IPSS    Row Name 05/30/16 1500         International Prostate Symptom Score   How often have you had the sensation of not emptying your bladder? Less than half the time     How often have you had to urinate less than every two hours? Less than 1 in 5 times     How often have you found you stopped and started again several times when you urinated? Less than 1 in 5 times     How often have you found it difficult to postpone urination? Not at All     How often have you had a weak urinary stream? Not at All     How often have you had to strain to start urination? Not at All     How many times did you typically get up at night to urinate? 1 Time     Total IPSS Score 5       Quality of Life due to urinary symptoms   If you were to spend the rest of your life with your urinary condition just the way it is now how would you feel about that? Mostly Satisfied         Score:  1-7 Mild 8-19 Moderate 20-35 Severe   PMH: Past Medical History:  Diagnosis Date  . CAD (coronary artery disease) 06/2014   UA with NSTEMI - cath with 99% prox L circ s/p stent, EF 40% ([facility], [place] at [facility])  . Emphysema    mild  . Ex-smoker   . NSTEMI (non-ST elevated myocardial infarction) ([facility]) 07/13/2014    Surgical History: Past Surgical History:  Procedure Laterality Date  . CARDIAC CATHETERIZATION N/A 07/14/2014   Left Heart Cath and Coronary Angiography with stent placement;  Surgeon: Teodoro Spray, MD  . CARDIAC CATHETERIZATION N/A 07/14/2014   Coronary Stent Intervention;  Surgeon: Yolonda Kida, MD  . CYSTECTOMY     on back  . INGUINAL HERNIA REPAIR Right 08/17/04    Home Medications:  Allergies as of 05/30/2016   No Known Allergies     Medication List       Accurate as of 05/30/16  3:36 PM. Always use your most recent med list.          aspirin EC  81 MG tablet Take 1 tablet (81 mg total) by mouth daily.   atorvastatin 40 MG tablet Commonly known as:  LIPITOR Take 1 tablet (40 mg total) by mouth daily at 6 PM.   clopidogrel 75 MG tablet Commonly known as:  PLAVIX Take 1 tablet (75 mg total) by mouth daily.   metoprolol tartrate 25 MG tablet Commonly known as:  LOPRESSOR Take 1 tablet (25 mg total) by mouth 2 (two) times daily.   tamsulosin 0.4 MG Caps capsule Commonly known as:  FLOMAX TAKE ONE CAPSULE BY MOUTH ONCE DAILY       Allergies: No Known Allergies  Family History: Family History  Problem Relation Age of Onset  . Cancer Mother 65       colon  . Cancer Sister        breast  . CAD Neg Hx   . Stroke Neg Hx   . Diabetes Neg Hx   . Prostate cancer Neg Hx   . Kidney cancer Neg Hx   . Bladder Cancer Neg Hx     Social History:  reports that he quit smoking about 17 years ago. His smoking use included Cigarettes. He has a 35.00 pack-year smoking history. He has never used smokeless tobacco. He reports that he  does not drink alcohol or use drugs.  ROS: UROLOGY Frequent Urination?: No Hard to postpone urination?: No Burning/pain with urination?: No Get up at night to urinate?: Yes Leakage of urine?: No Urine stream starts and stops?: No Trouble starting stream?: No Do you have to strain to urinate?: No Blood in urine?: No Urinary tract infection?: No Sexually transmitted disease?: No Injury to kidneys or bladder?: No Painful intercourse?: No Weak stream?: No Erection problems?: No Penile pain?: No  Gastrointestinal Nausea?: No Vomiting?: No Indigestion/heartburn?: Yes Diarrhea?: No Constipation?: No  Constitutional Fever: No Night sweats?: No Weight loss?: No Fatigue?: No  Skin Skin rash/lesions?: No Itching?: Yes  Eyes Blurred vision?: No Double vision?: No  Ears/Nose/Throat Sore throat?: No Sinus problems?: Yes  Hematologic/Lymphatic Swollen glands?: No Easy bruising?: Yes  Cardiovascular Leg swelling?: No Chest pain?: No  Respiratory Cough?: No Shortness of breath?: No  Endocrine Excessive thirst?: No  Musculoskeletal Back pain?: No Joint pain?: No  Neurological Headaches?: No Dizziness?: No  Psychologic Depression?: No Anxiety?: No  Physical Exam: BP (!) 143/94   Pulse 69   Ht 5\' 9"  (1.753 m)   Wt 180 lb 6.4 oz (81.8 kg)   BMI 26.64 kg/m   Constitutional:  Alert and oriented, No acute distress. HEENT: [place] AT, moist mucus membranes.  Trachea midline, no masses. Cardiovascular: No clubbing, cyanosis, or edema. Respiratory: Normal respiratory effort, no increased work of breathing. GI: Abdomen is soft, nontender, nondistended, no abdominal masses GU: No CVA tenderness. Non-circ'd, phallus straight. Testes down w/o masses. DRE 45gm smooth. Skin: No rashes, bruises or suspicious lesions. Lymph: No cervical or inguinal adenopathy. Neurologic: Grossly intact, no focal deficits, moving all 4 extremities. Psychiatric: Normal mood and  affect.  Laboratory Data: Lab Results  Component Value Date   WBC 8.9 12/25/2015   HGB 15.4 12/25/2015   HCT 45.5 12/25/2015   MCV 91.0 12/25/2015   PLT 251.0 12/25/2015    Lab Results  Component Value Date   CREATININE 1.65 (H) 12/25/2015    Lab Results  Component Value Date   PSA 3.25 03/20/2015    Lab Results  Component Value Date   HGBA1C 5.5 07/14/2014    Urinalysis Unremarkable.  See  EPIC   Assessment & Plan:    1.  History of hematuria  - completed workup in 2017 with CTU and cystoscopy - no worrisome findings - bilobar prostate hypertrophy   - UA today was unremarkable  - RTC in one year for UA  2. BPH with LUTS  - IPSS score is 5/2  - Continue conservative management, avoiding bladder irritants and timed voiding's  - most bothersome symptoms is/are nocturia x 1, but it is down from 4 times nightly  - Continue tamsulosin 0.4 mg daily  - RTC in 12 months for I PSS and exam   Return in about 1 year (around 05/30/2017) for IPSS, UA and exam.  Zara Council, [facility]  [place] [address], [place], [place] [address] [phone]

## 2016-11-15 ENCOUNTER — Other Ambulatory Visit: Payer: Self-pay | Admitting: Family Medicine

## 2016-12-02 ENCOUNTER — Telehealth: Payer: Self-pay | Admitting: Family Medicine

## 2016-12-02 NOTE — Telephone Encounter (Signed)
Opened in error

## 2016-12-03 ENCOUNTER — Ambulatory Visit (INDEPENDENT_AMBULATORY_CARE_PROVIDER_SITE_OTHER): Payer: Medicare Other

## 2016-12-03 DIAGNOSIS — Z23 Encounter for immunization: Secondary | ICD-10-CM | POA: Diagnosis not present

## 2016-12-11 ENCOUNTER — Ambulatory Visit (INDEPENDENT_AMBULATORY_CARE_PROVIDER_SITE_OTHER): Payer: Medicare Other | Admitting: Primary Care

## 2016-12-11 ENCOUNTER — Encounter: Payer: Self-pay | Admitting: Primary Care

## 2016-12-11 VITALS — BP 122/82 | HR 82 | Temp 97.8°F | Ht 69.0 in | Wt 180.8 lb

## 2016-12-11 DIAGNOSIS — B029 Zoster without complications: Secondary | ICD-10-CM

## 2016-12-11 MED ORDER — VALACYCLOVIR HCL 1 G PO TABS: 1000.0000 mg | ORAL_TABLET | Freq: Three times a day (TID) | ORAL | 0 refills | Status: DC

## 2016-12-11 NOTE — Patient Instructions (Signed)
You have shingles.  Start valacyclovir 1000 mg tablets. Take 1 tablet by mouth three times daily for seven days.  Protect those vesicles as discussed.  Please call me if you develop any pain and/or if no improvement in 3-4 days.  It was a pleasure meeting you!   Shingles Shingles, which is also known as herpes zoster, is an infection that causes a painful skin rash and fluid-filled blisters. Shingles is not related to genital herpes, which is a sexually transmitted infection. Shingles only develops in people who:  Have had chickenpox.  Have received the chickenpox vaccine. (This is rare.)  What are the causes? Shingles is caused by varicella-zoster virus (VZV). This is the same virus that causes chickenpox. After exposure to VZV, the virus stays in the body in an inactive (dormant) state. Shingles develops if the virus reactivates. This can happen many years after the initial exposure to VZV. It is not known what causes this virus to reactivate. What increases the risk? People who have had chickenpox or received the chickenpox vaccine are at risk for shingles. Infection is more common in people who:  Are older than age 39.  Have a weakened defense (immune) system, such as those with HIV, AIDS, or cancer.  Are taking medicines that weaken the immune system, such as transplant medicines.  Are under great stress.  What are the signs or symptoms? Early symptoms of this condition include itching, tingling, and pain in an area on your skin. Pain may be described as burning, stabbing, or throbbing. A few days or weeks after symptoms start, a painful red rash appears, usually on one side of the body in a bandlike or beltlike pattern. The rash eventually turns into fluid-filled blisters that break open, scab over, and dry up in about 2-3 weeks. At any time during the infection, you may also develop:  A fever.  Chills.  A headache.  An upset stomach.  How is this diagnosed? This  condition is diagnosed with a skin exam. Sometimes, skin or fluid samples are taken from the blisters before a diagnosis is made. These samples are examined under a microscope or sent to a lab for testing. How is this treated? There is no specific cure for this condition. Your health care provider will probably prescribe medicines to help you manage pain, recover more quickly, and avoid long-term problems. Medicines may include:  Antiviral drugs.  Anti-inflammatory drugs.  Pain medicines.  If the area involved is on your face, you may be referred to a specialist, such as an eye doctor (ophthalmologist) or an ear, nose, and throat (ENT) doctor to help you avoid eye problems, chronic pain, or disability. Follow these instructions at home: Medicines  Take medicines only as directed by your health care provider.  Apply an anti-itch or numbing cream to the affected area as directed by your health care provider. Blister and Rash Care  Take a cool bath or apply cool compresses to the area of the rash or blisters as directed by your health care provider. This may help with pain and itching.  Keep your rash covered with a loose bandage (dressing). Wear loose-fitting clothing to help ease the pain of material rubbing against the rash.  Keep your rash and blisters clean with mild soap and cool water or as directed by your health care provider.  Check your rash every day for signs of infection. These include redness, swelling, and pain that lasts or increases.  Do not pick your blisters.  Do  not scratch your rash. General instructions  Rest as directed by your health care provider.  Keep all follow-up visits as directed by your health care provider. This is important.  Until your blisters scab over, your infection can cause chickenpox in people who have never had it or been vaccinated against it. To prevent this from happening, avoid contact with other people,  especially: ? Babies. ? Pregnant women. ? Children who have eczema. ? Elderly people who have transplants. ? People who have chronic illnesses, such as leukemia or AIDS. Contact a health care provider if:  Your pain is not relieved with prescribed medicines.  Your pain does not get better after the rash heals.  Your rash looks infected. Signs of infection include redness, swelling, and pain that lasts or increases. Get help right away if:  The rash is on your face or nose.  You have facial pain, pain around your eye area, or loss of feeling on one side of your face.  You have ear pain or you have ringing in your ear.  You have loss of taste.  Your condition gets worse. This information is not intended to replace advice given to you by your health care provider. Make sure you discuss any questions you have with your health care provider. Document Released: 12/31/2004 Document Revised: 08/27/2015 Document Reviewed: 11/11/2013 Elsevier Interactive Patient Education  2017 [employer].

## 2016-12-11 NOTE — Progress Notes (Signed)
Subjective:    Patient ID: Albert Giles, male    DOB: Sep 09, 1945, 71 y.o.   MRN: [id number]  HPI  Mr. Ramthun is a 71 year old male who presents today with a chief complaint of rash. His rash is located to the right anterior and lateral chest wall. He first noticed this four days ago to the anterior right chest wall which has since spread to the right lateral chest. He's not had a shingles vaccination. History of chicken pox as a child.   He denies changes in soaps, detergents, food, medications. He's not been in the yard or in any brush.    Review of Systems  Constitutional: Negative for fever.  Skin: Positive for color change and rash.  Neurological: Negative for numbness.       Past Medical History:  Diagnosis Date  . CAD (coronary artery disease) 06/2014   UA with NSTEMI - cath with 99% prox L circ s/p stent, EF 40% ([facility], [place] at [facility])  . Emphysema    mild  . Ex-smoker   . NSTEMI (non-ST elevated myocardial infarction) ([facility]) 07/13/2014     Social History   Socioeconomic History  . Marital status: Married    Spouse name: Not on file  . Number of children: Not on file  . Years of education: Not on file  . Highest education level: Not on file  Social Needs  . Financial resource strain: Not on file  . Food insecurity - worry: Not on file  . Food insecurity - inability: Not on file  . Transportation needs - medical: Not on file  . Transportation needs - non-medical: Not on file  Occupational History  . Not on file  Tobacco Use  . Smoking status: Former Smoker    Packs/day: 1.00    Years: 35.00    Pack years: 35.00    Types: Cigarettes    Last attempt to quit: 07/07/1998    Years since quitting: 18.4  . Smokeless tobacco: Never Used  Substance and Sexual Activity  . Alcohol use: No  . Drug use: No  . Sexual activity: Not on file  Other Topics Concern  . Not on file  Social History Narrative   Caffeine: 2 cups coffee, 2 cups tea/day   Lives with wife   Occupation: Higher education careers adviser, retired   Edu: HS   Activity: works in yard, walking   Diet: some water, fruits/vegetables daily    Past Surgical History:  Procedure Laterality Date  . CARDIAC CATHETERIZATION N/A 07/14/2014   Left Heart Cath and Coronary Angiography with stent placement;  Surgeon: Teodoro Spray, MD  . CARDIAC CATHETERIZATION N/A 07/14/2014   Coronary Stent Intervention;  Surgeon: Yolonda Kida, MD  . CYSTECTOMY     on back  . INGUINAL HERNIA REPAIR Right 08/17/04    Family History  Problem Relation Age of Onset  . Cancer Mother 47       colon  . Cancer Sister        breast  . CAD Neg Hx   . Stroke Neg Hx   . Diabetes Neg Hx   . Prostate cancer Neg Hx   . Kidney cancer Neg Hx   . Bladder Cancer Neg Hx     No Known Allergies  Current Outpatient Medications on File Prior to Visit  Medication Sig Dispense Refill  . aspirin EC 81 MG tablet Take 1 tablet (81 mg total) by mouth daily. 60 tablet 1  . atorvastatin (  LIPITOR) 40 MG tablet Take 1 tablet (40 mg total) by mouth daily at 6 PM. 60 tablet 1  . clopidogrel (PLAVIX) 75 MG tablet Take 1 tablet (75 mg total) by mouth daily. 60 tablet 1  . metoprolol tartrate (LOPRESSOR) 25 MG tablet Take 1 tablet (25 mg total) by mouth 2 (two) times daily. 60 tablet 1  . tamsulosin (FLOMAX) 0.4 MG CAPS capsule TAKE 1 CAPSULE BY MOUTH ONCE DAILY 90 capsule 0   No current facility-administered medications on file prior to visit.     BP 122/82   Pulse 82   Temp 97.8 F (36.6 C) (Oral)   Ht 5\' 9"  (1.753 m)   Wt 180 lb 12.8 oz (82 kg)   SpO2 98%   BMI 26.70 kg/m    Objective:   Physical Exam  Constitutional: He appears well-nourished.  Neck: Neck supple.  Cardiovascular: Normal rate and regular rhythm.  Pulmonary/Chest: Effort normal and breath sounds normal.  Skin: Skin is warm and dry.     Unilateral, linear, bullous vesicles to right anterior, lateral, and posterior chest wall. Moderate erythema surrounding.  Non tender.           Assessment & Plan:  Herpes Zoster:  Rash x 4 days, denies pain. Exam today consistent for classic shingles.  Discussed potential spread until vesicles crust over. Rx for Valtrex 1000 mg TID x 7 day course. He declines anything for pain but will notify if needed.  Sheral Flow, NP

## 2017-01-03 DIAGNOSIS — Z9861 Coronary angioplasty status: Secondary | ICD-10-CM | POA: Diagnosis not present

## 2017-01-03 DIAGNOSIS — I2510 Atherosclerotic heart disease of native coronary artery without angina pectoris: Secondary | ICD-10-CM | POA: Diagnosis not present

## 2017-01-03 DIAGNOSIS — I214 Non-ST elevation (NSTEMI) myocardial infarction: Secondary | ICD-10-CM | POA: Diagnosis not present

## 2017-01-14 ENCOUNTER — Other Ambulatory Visit: Payer: Self-pay | Admitting: Family Medicine

## 2017-01-14 DIAGNOSIS — N138 Other obstructive and reflux uropathy: Secondary | ICD-10-CM

## 2017-01-14 DIAGNOSIS — E785 Hyperlipidemia, unspecified: Secondary | ICD-10-CM | POA: Insufficient documentation

## 2017-01-14 DIAGNOSIS — N401 Enlarged prostate with lower urinary tract symptoms: Secondary | ICD-10-CM

## 2017-01-14 DIAGNOSIS — N183 Chronic kidney disease, stage 3 unspecified: Secondary | ICD-10-CM

## 2017-01-14 DIAGNOSIS — I25110 Atherosclerotic heart disease of native coronary artery with unstable angina pectoris: Secondary | ICD-10-CM

## 2017-01-17 ENCOUNTER — Ambulatory Visit (INDEPENDENT_AMBULATORY_CARE_PROVIDER_SITE_OTHER): Payer: Medicare HMO

## 2017-01-17 VITALS — BP 126/84 | HR 78 | Temp 97.8°F | Ht 67.5 in | Wt 176.5 lb

## 2017-01-17 DIAGNOSIS — I25110 Atherosclerotic heart disease of native coronary artery with unstable angina pectoris: Secondary | ICD-10-CM | POA: Diagnosis not present

## 2017-01-17 DIAGNOSIS — N183 Chronic kidney disease, stage 3 unspecified: Secondary | ICD-10-CM

## 2017-01-17 DIAGNOSIS — Z1211 Encounter for screening for malignant neoplasm of colon: Secondary | ICD-10-CM

## 2017-01-17 DIAGNOSIS — E785 Hyperlipidemia, unspecified: Secondary | ICD-10-CM

## 2017-01-17 DIAGNOSIS — Z0000 Encounter for general adult medical examination without abnormal findings: Secondary | ICD-10-CM

## 2017-01-17 LAB — LIPID PANEL
Cholesterol: 110 mg/dL (ref 0–200)
HDL: 37.4 mg/dL — AB (ref >39.00)
LDL Cholesterol: 50 mg/dL (ref 0–99)
NONHDL: 72.1
Total CHOL/HDL Ratio: 3
Triglycerides: 112 mg/dL (ref 0.0–149.0)
VLDL: 22.4 mg/dL (ref 0.0–40.0)

## 2017-01-17 LAB — VITAMIN D 25 HYDROXY (VIT D DEFICIENCY, FRACTURES): VITD: 31.82 ng/mL (ref 30.00–100.00)

## 2017-01-17 LAB — COMPREHENSIVE METABOLIC PANEL
ALBUMIN: 4 g/dL (ref 3.5–5.2)
ALK PHOS: 107 U/L (ref 39–117)
ALT: 13 U/L (ref 0–53)
AST: 13 U/L (ref 0–37)
BUN: 13 mg/dL (ref 6–23)
CO2: 27 mEq/L (ref 19–32)
Calcium: 9 mg/dL (ref 8.4–10.5)
Chloride: 106 mEq/L (ref 96–112)
Creatinine, Ser: 1.39 mg/dL (ref 0.40–1.50)
GFR: 53.17 mL/min — AB (ref >60.00)
Glucose, Bld: 96 mg/dL (ref 70–99)
POTASSIUM: 4.1 meq/L (ref 3.5–5.1)
Sodium: 140 mEq/L (ref 135–145)
TOTAL PROTEIN: 6.7 g/dL (ref 6.0–8.3)
Total Bilirubin: 0.7 mg/dL (ref 0.2–1.2)

## 2017-01-17 NOTE — Progress Notes (Signed)
PCP notes:   Health maintenance:  Colon cancer screening - FOBT kit provided Tetanus vaccine - postponed/insurance  Abnormal screenings:   Mini-Cog score: 19/20 Hearing - failed  Hearing Screening   125Hz 250Hz 500Hz 1000Hz 2000Hz 3000Hz 4000Hz 6000Hz 8000Hz  Right ear:   40 40 40  40    Left ear:   40 40 0  0     Patient concerns:   Patient reports still feeling discomfort from shingles.   Nurse concerns:  None  Next PCP appt:   01/24/17 @ 1400  

## 2017-01-17 NOTE — Patient Instructions (Signed)
Mr. Diloreto , Thank you for taking time to come for your Medicare Wellness Visit. I appreciate your ongoing commitment to your health goals. Please review the following plan we discussed and let me know if I can assist you in the future.   These are the goals we discussed: Goals    . Increase physical activity     Starting 01/17/2017 and weather permitting, I will continue to walk 30 minutes daily.        This is a list of the screening recommended for you and due dates:  Health Maintenance  Topic Date Due  . Stool Blood Test  02/13/2017*  . DTaP/Tdap/Td vaccine (1 - Tdap) 01/17/2018*  . Tetanus Vaccine  01/17/2018*  . Flu Shot  Completed  .  Hepatitis C: One time screening is recommended by Center for Disease Control  (CDC) for  adults born from [birth year] through 1967.   Completed  . Pneumonia vaccines  Completed  *Topic was postponed. The date shown is not the original due date.   Preventive Care for Adults  A healthy lifestyle and preventive care can promote health and wellness. Preventive health guidelines for adults include the following key practices.  . A routine yearly physical is a good way to check with your health care provider about your health and preventive screening. It is a chance to share any concerns and updates on your health and to receive a thorough exam.  . Visit your dentist for a routine exam and preventive care every 6 months. Brush your teeth twice a day and floss once a day. Good oral hygiene prevents tooth decay and gum disease.  . The frequency of eye exams is based on your age, health, family medical history, use  of contact lenses, and other factors. Follow your health care provider's recommendations for frequency of eye exams.  . Eat a healthy diet. Foods like vegetables, fruits, whole grains, low-fat dairy products, and lean protein foods contain the nutrients you need without too many calories. Decrease your intake of foods high in solid fats, added sugars,  and salt. Eat the right amount of calories for you. Get information about a proper diet from your health care provider, if necessary.  . Regular physical exercise is one of the most important things you can do for your health. Most adults should get at least 150 minutes of moderate-intensity exercise (any activity that increases your heart rate and causes you to sweat) each week. In addition, most adults need muscle-strengthening exercises on 2 or more days a week.  Silver Sneakers may be a benefit available to you. To determine eligibility, you may visit the website: www.silversneakers.com or contact program at 9376773362 Mon-Fri between 8AM-8PM.   . Maintain a healthy weight. The body mass index (BMI) is a screening tool to identify possible weight problems. It provides an estimate of body fat based on height and weight. Your health care provider can find your BMI and can help you achieve or maintain a healthy weight.   For adults 20 years and older: ? A BMI below 18.5 is considered underweight. ? A BMI of 18.5 to 24.9 is normal. ? A BMI of 25 to 29.9 is considered overweight. ? A BMI of 30 and above is considered obese.   . Maintain normal blood lipids and cholesterol levels by exercising and minimizing your intake of saturated fat. Eat a balanced diet with plenty of fruit and vegetables. Blood tests for lipids and cholesterol should begin at age 73  and be repeated every 5 years. If your lipid or cholesterol levels are high, you are over 50, or you are at high risk for heart disease, you may need your cholesterol levels checked more frequently. Ongoing high lipid and cholesterol levels should be treated with medicines if diet and exercise are not working.  . If you smoke, find out from your health care provider how to quit. If you do not use tobacco, please do not start.  . If you choose to drink alcohol, please do not consume more than 2 drinks per day. One drink is considered to be 12  ounces (355 mL) of beer, 5 ounces (148 mL) of wine, or 1.5 ounces (44 mL) of liquor.  . If you are 55-74 years old, ask your health care provider if you should take aspirin to prevent strokes.  . Use sunscreen. Apply sunscreen liberally and repeatedly throughout the day. You should seek shade when your shadow is shorter than you. Protect yourself by wearing long sleeves, pants, a wide-brimmed hat, and sunglasses year round, whenever you are outdoors.  . Once a month, do a whole body skin exam, using a mirror to look at the skin on your back. Tell your health care provider of new moles, moles that have irregular borders, moles that are larger than a pencil eraser, or moles that have changed in shape or color.

## 2017-01-17 NOTE — Progress Notes (Signed)
Subjective:   Albert Giles is a 72 y.o. male who presents for Medicare Annual/Subsequent preventive examination.  Review of Systems:  N/A Cardiac Risk Factors include: advanced age (>55men, >69 women);male gender;dyslipidemia     Objective:    Vitals: BP 126/84 (BP Location: Right Arm, Patient Position: Sitting, Cuff Size: Normal)   Pulse 78   Temp 97.8 F (36.6 C) (Oral)   Ht 5' 7.5" (1.715 m) Comment: no shoes  Wt 176 lb 8 oz (80.1 kg)   SpO2 95%   BMI 27.24 kg/m   Body mass index is 27.24 kg/m.  Advanced Directives 01/17/2017 07/13/2014  Does Patient Have a Medical Advance Directive? No No  Would patient like information on creating a medical advance directive? No - Patient declined No - patient declined information    Tobacco Social History   Tobacco Use  Smoking Status Former Smoker  . Packs/day: 1.00  . Years: 35.00  . Pack years: 35.00  . Types: Cigarettes  . Last attempt to quit: 07/07/1998  . Years since quitting: 18.5  Smokeless Tobacco Never Used     Counseling given: No   Clinical Intake:  Pre-visit preparation completed: Yes  Pain : No/denies pain Pain Score: 0-No pain     Nutritional Status: BMI 25 -29 Overweight Nutritional Risks: None Diabetes: No  How often do you need to have someone help you when you read instructions, pamphlets, or other written materials from your doctor or pharmacy?: 1 - Never What is the last grade level you completed in school?: 12th grade  Interpreter Needed?: No  Comments: pt lives with spouse Information entered by :: LPinson, LPN  Past Medical History:  Diagnosis Date  . CAD (coronary artery disease) 06/2014   UA with NSTEMI - cath with 99% prox L circ s/p stent, EF 40% ([place], [place] [facility])  . Emphysema    mild  . Ex-smoker   . NSTEMI (non-ST elevated myocardial infarction) ([place]) 07/13/2014   Past Surgical History:  Procedure Laterality Date  . CARDIAC CATHETERIZATION N/A 07/14/2014   Left Heart  Cath and Coronary Angiography with stent placement;  Surgeon: Teodoro Spray, MD  . CARDIAC CATHETERIZATION N/A 07/14/2014   Coronary Stent Intervention;  Surgeon: Yolonda Kida, MD  . CYSTECTOMY     on back  . INGUINAL HERNIA REPAIR Right 08/17/04   Family History  Problem Relation Age of Onset  . Cancer Mother 26       colon  . Cancer Sister        breast  . CAD Neg Hx   . Stroke Neg Hx   . Diabetes Neg Hx   . Prostate cancer Neg Hx   . Kidney cancer Neg Hx   . Bladder Cancer Neg Hx    Social History   Socioeconomic History  . Marital status: Married    Spouse name: None  . Number of children: None  . Years of education: None  . Highest education level: None  Social Needs  . Financial resource strain: None  . Food insecurity - worry: None  . Food insecurity - inability: None  . Transportation needs - medical: None  . Transportation needs - non-medical: None  Occupational History  . None  Tobacco Use  . Smoking status: Former Smoker    Packs/day: 1.00    Years: 35.00    Pack years: 35.00    Types: Cigarettes    Last attempt to quit: 07/07/1998    Years since  quitting: 18.5  . Smokeless tobacco: Never Used  Substance and Sexual Activity  . Alcohol use: No  . Drug use: No  . Sexual activity: None  Other Topics Concern  . None  Social History Narrative   Caffeine: 2 cups coffee, 2 cups tea/day   Lives with wife   Occupation: Higher education careers adviser, retired   Edu: HS   Activity: works in yard, walking   Diet: some water, fruits/vegetables daily    Outpatient Encounter Medications as of 01/17/2017  Medication Sig  . aspirin EC 81 MG tablet Take 1 tablet (81 mg total) by mouth daily.  Marland Kitchen atorvastatin (LIPITOR) 40 MG tablet Take 1 tablet (40 mg total) by mouth daily at 6 PM.  . clopidogrel (PLAVIX) 75 MG tablet Take 1 tablet (75 mg total) by mouth daily.  . metoprolol tartrate (LOPRESSOR) 25 MG tablet Take 1 tablet (25 mg total) by mouth 2 (two) times daily.  .  tamsulosin (FLOMAX) 0.4 MG CAPS capsule TAKE 1 CAPSULE BY MOUTH ONCE DAILY  . [DISCONTINUED] valACYclovir (VALTREX) 1000 MG tablet Take 1 tablet (1,000 mg total) by mouth 3 (three) times daily.   No facility-administered encounter medications on file as of 01/17/2017.     Activities of Daily Living In your present state of health, do you have any difficulty performing the following activities: 01/17/2017  Hearing? N  Vision? N  Difficulty concentrating or making decisions? N  Walking or climbing stairs? N  Dressing or bathing? N  Doing errands, shopping? N  Preparing Food and eating ? N  Using the Toilet? N  In the past six months, have you accidently leaked urine? N  Do you have problems with loss of bowel control? N  Managing your Medications? N  Managing your Finances? N  Housekeeping or managing your Housekeeping? N  Some recent data might be hidden    Patient Care Team: [name], MD as PCP - General (Family Medicine)   Assessment:   This is a routine wellness examination for Albert Giles.   Hearing Screening   125Hz  250Hz  500Hz  1000Hz  2000Hz  3000Hz  4000Hz  6000Hz  8000Hz   Right ear:   40 40 40  40    Left ear:   40 40 0  0      Visual Acuity Screening   Right eye Left eye Both eyes  Without correction: 20/20 20/20 20/20   With correction:      Exercise Activities and Dietary recommendations Current Exercise Habits: Home exercise routine, Type of exercise: walking, Time (Minutes): 30, Frequency (Times/Week): 7, Weekly Exercise (Minutes/Week): 210, Intensity: Mild, Exercise limited by: None identified  Goals    . Increase physical activity     Starting 01/17/2017 and weather permitting, I will continue to walk 30 minutes daily.        Fall Risk Fall Risk  01/17/2017 01/01/2016 12/30/2014 12/27/2013  Falls in the past year? No No No No   Depression Screen PHQ 2/9 Scores 01/17/2017 01/01/2016 12/30/2014 12/27/2013  PHQ - 2 Score 0 0 0 0  PHQ- 9 Score 0 - - -     Cognitive Function MMSE - Mini Mental State Exam 01/17/2017  Orientation to time 5  Orientation to Place 5  Registration 3  Attention/ Calculation 0  Recall 2  Recall-comments unable to recall 1 of 3 words  Language- name 2 objects 0  Language- repeat 1  Language- follow 3 step command 3  Language- read & follow direction 0  Write a sentence 0  Copy design  0  Total score 19       PLEASE NOTE: A Mini-Cog screen was completed. Maximum score is 20. A value of 0 denotes this part of Folstein MMSE was not completed or the patient failed this part of the Mini-Cog screening.   Mini-Cog Screening Orientation to Time - Max 5 pts Orientation to Place - Max 5 pts Registration - Max 3 pts Recall - Max 3 pts Language Repeat - Max 1 pts Language Follow 3 Step Command - Max 3 pts   Immunization History  Administered Date(s) Administered  . Influenza,inj,Quad PF,6+ Mos 12/30/2014, 11/28/2015, 12/03/2016  . Pneumococcal Conjugate-13 12/27/2013  . Pneumococcal Polysaccharide-23 01/01/2016  . Td 01/15/2007    Screening Tests Health Maintenance  Topic Date Due  . COLON CANCER SCREENING ANNUAL FOBT  02/13/2017 (Originally 01/08/2017)  . DTaP/Tdap/Td (1 - Tdap) 01/17/2018 (Originally 01/16/2007)  . TETANUS/TDAP  01/17/2018 (Originally 01/14/2017)  . INFLUENZA VACCINE  Completed  . Hepatitis C Screening  Completed  . PNA vac Low Risk Adult  Completed     Plan:   I have personally reviewed, addressed, and noted the following in the patient's chart:  A. Medical and social history B. Use of alcohol, tobacco or illicit drugs  C. Current medications and supplements D. Functional ability and status E.  Nutritional status F.  Physical activity G. Advance directives H. List of other physicians I.  Hospitalizations, surgeries, and ER visits in previous 12 months J.  [place] to include hearing, vision, cognitive, depression L. Referrals and appointments - none  In addition,  I have reviewed and discussed with patient certain preventive protocols, quality metrics, and best practice recommendations. A written personalized care plan for preventive services as well as general preventive health recommendations were provided to patient.  See attached scanned questionnaire for additional information.   Signed,   Lindell Noe, MHA, BS, LPN Health Coach   Lindell Noe, [place]  01/20/5100

## 2017-01-17 NOTE — Progress Notes (Signed)
Pre visit review using our clinic review tool, if applicable. No additional management support is needed unless otherwise documented below in the visit note. 

## 2017-01-19 NOTE — Progress Notes (Signed)
I reviewed health advisor's note, was available for consultation, and agree with documentation and plan.  

## 2017-01-24 ENCOUNTER — Encounter: Payer: Self-pay | Admitting: Family Medicine

## 2017-01-24 ENCOUNTER — Ambulatory Visit (INDEPENDENT_AMBULATORY_CARE_PROVIDER_SITE_OTHER): Payer: Medicare HMO | Admitting: Family Medicine

## 2017-01-24 VITALS — BP 118/74 | HR 77 | Temp 97.9°F | Ht 68.0 in | Wt 177.0 lb

## 2017-01-24 DIAGNOSIS — N183 Chronic kidney disease, stage 3 unspecified: Secondary | ICD-10-CM

## 2017-01-24 DIAGNOSIS — Z800 Family history of malignant neoplasm of digestive organs: Secondary | ICD-10-CM | POA: Diagnosis not present

## 2017-01-24 DIAGNOSIS — N138 Other obstructive and reflux uropathy: Secondary | ICD-10-CM

## 2017-01-24 DIAGNOSIS — Z7189 Other specified counseling: Secondary | ICD-10-CM

## 2017-01-24 DIAGNOSIS — Z0000 Encounter for general adult medical examination without abnormal findings: Secondary | ICD-10-CM | POA: Diagnosis not present

## 2017-01-24 DIAGNOSIS — N401 Enlarged prostate with lower urinary tract symptoms: Secondary | ICD-10-CM

## 2017-01-24 DIAGNOSIS — E785 Hyperlipidemia, unspecified: Secondary | ICD-10-CM | POA: Diagnosis not present

## 2017-01-24 DIAGNOSIS — I2510 Atherosclerotic heart disease of native coronary artery without angina pectoris: Secondary | ICD-10-CM | POA: Diagnosis not present

## 2017-01-24 NOTE — Assessment & Plan Note (Signed)
Continues to decline colonoscopy although we discussed this is optimal test in fmhx - so will continue with iFOB.

## 2017-01-24 NOTE — Assessment & Plan Note (Signed)
Preventative protocols reviewed and updated unless pt declined. Discussed healthy diet and lifestyle.  

## 2017-01-24 NOTE — Assessment & Plan Note (Addendum)
Appreciate urology care - continue flomax

## 2017-01-24 NOTE — Assessment & Plan Note (Signed)
Chronic, stable. Improved readings this year. Encouraged continue good hydration status and avoid NSAIDs and other nephrotoxins

## 2017-01-24 NOTE — Assessment & Plan Note (Signed)
Advanced directives - doesn't have this set up. Would want wife then 2 daughters. Has packet at home - encouraged he fill this out.

## 2017-01-24 NOTE — Assessment & Plan Note (Signed)
Chronic, stable. Continue lipitor.  The ASCVD Risk score (Goff DC Jr., et al., 2013) failed to calculate for the following reasons:   The patient has a prior MI or stroke diagnosis  

## 2017-01-24 NOTE — Patient Instructions (Addendum)
If interested, check with pharmacy about new 2 shot shingles series (shingrix).  Work on Financial controller.  Increase water to keep kidneys well hydrated.  You are doing well today Return as needed or in 1 year for next medicare wellness visit with Katha Cabal and physical with me.   Health Maintenance, Male A healthy lifestyle and preventive care is important for your health and wellness. Ask your health care provider about what schedule of regular examinations is right for you. What should I know about weight and diet? Eat a Healthy Diet  Eat plenty of vegetables, fruits, whole grains, low-fat dairy products, and lean protein.  Do not eat a lot of foods high in solid fats, added sugars, or salt.  Maintain a Healthy Weight Regular exercise can help you achieve or maintain a healthy weight. You should:  Do at least 150 minutes of exercise each week. The exercise should increase your heart rate and make you sweat (moderate-intensity exercise).  Do strength-training exercises at least twice a week.  Watch Your Levels of Cholesterol and Blood Lipids  Have your blood tested for lipids and cholesterol every 5 years starting at 74 years of age. If you are at high risk for heart disease, you should start having your blood tested when you are 74 years old. You may need to have your cholesterol levels checked more often if: ? Your lipid or cholesterol levels are high. ? You are older than 74 years of age. ? You are at high risk for heart disease.  What should I know about cancer screening? Many types of cancers can be detected early and may often be prevented. Lung Cancer  You should be screened every year for lung cancer if: ? You are a current smoker who has smoked for at least 30 years. ? You are a former smoker who has quit within the past 15 years.  Talk to your health care provider about your screening options, when you should start screening, and how often you should be  screened.  Colorectal Cancer  Routine colorectal cancer screening usually begins at 74 years of age and should be repeated every 5-10 years until you are 74 years old. You may need to be screened more often if early forms of precancerous polyps or small growths are found. Your health care provider may recommend screening at an earlier age if you have risk factors for colon cancer.  Your health care provider may recommend using home test kits to check for hidden blood in the stool.  A small camera at the end of a tube can be used to examine your colon (sigmoidoscopy or colonoscopy). This checks for the earliest forms of colorectal cancer.  Prostate and Testicular Cancer  Depending on your age and overall health, your health care provider may do certain tests to screen for prostate and testicular cancer.  Talk to your health care provider about any symptoms or concerns you have about testicular or prostate cancer.  Skin Cancer  Check your skin from head to toe regularly.  Tell your health care provider about any new moles or changes in moles, especially if: ? There is a change in a mole's size, shape, or color. ? You have a mole that is larger than a pencil eraser.  Always use sunscreen. Apply sunscreen liberally and repeat throughout the day.  Protect yourself by wearing long sleeves, pants, a wide-brimmed hat, and sunglasses when outside.  What should I know about heart disease, diabetes, and high blood pressure?  If you are 74-45 years of age, have your blood pressure checked every 3-5 years. If you are 54 years of age or older, have your blood pressure checked every year. You should have your blood pressure measured twice-once when you are at a hospital or clinic, and once when you are not at a hospital or clinic. Record the average of the two measurements. To check your blood pressure when you are not at a hospital or clinic, you can use: ? An automated blood pressure machine at a  pharmacy. ? A home blood pressure monitor.  Talk to your health care provider about your target blood pressure.  If you are between 21-45 years old, ask your health care provider if you should take aspirin to prevent heart disease.  Have regular diabetes screenings by checking your fasting blood sugar level. ? If you are at a normal weight and have a low risk for diabetes, have this test once every three years after the age of 31. ? If you are overweight and have a high risk for diabetes, consider being tested at a younger age or more often.  A one-time screening for abdominal aortic aneurysm (AAA) by ultrasound is recommended for men aged 36-75 years who are current or former smokers. What should I know about preventing infection? Hepatitis B If you have a higher risk for hepatitis B, you should be screened for this virus. Talk with your health care provider to find out if you are at risk for hepatitis B infection. Hepatitis C Blood testing is recommended for:  Everyone born from [birth year] through 1967.  Anyone with known risk factors for hepatitis C.  Sexually Transmitted Diseases (STDs)  You should be screened each year for STDs including gonorrhea and chlamydia if: ? You are sexually active and are younger than 75 years of age. ? You are older than 74 years of age and your health care provider tells you that you are at risk for this type of infection. ? Your sexual activity has changed since you were last screened and you are at an increased risk for chlamydia or gonorrhea. Ask your health care provider if you are at risk.  Talk with your health care provider about whether you are at high risk of being infected with HIV. Your health care provider may recommend a prescription medicine to help prevent HIV infection.  What else can I do?  Schedule regular health, dental, and eye exams.  Stay current with your vaccines (immunizations).  Do not use any tobacco products, such as  cigarettes, chewing tobacco, and e-cigarettes. If you need help quitting, ask your health care provider.  Limit alcohol intake to no more than 2 drinks per day. One drink equals 12 ounces of beer, 5 ounces of wine, or 1 ounces of hard liquor.  Do not use street drugs.  Do not share needles.  Ask your health care provider for help if you need support or information about quitting drugs.  Tell your health care provider if you often feel depressed.  Tell your health care provider if you have ever been abused or do not feel safe at home. This information is not intended to replace advice given to you by your health care provider. Make sure you discuss any questions you have with your health care provider. Document Released: 06/29/2007 Document Revised: 08/30/2015 Document Reviewed: 10/04/2014 Elsevier Interactive Patient Education  [employer].

## 2017-01-24 NOTE — Assessment & Plan Note (Signed)
Followed by Dr Ubaldo Glassing. H/o NSTEMI 2016. Continue DAPT, statin, beta blocker

## 2017-01-24 NOTE — Progress Notes (Signed)
BP 118/74 (BP Location: Left Arm, Patient Position: Sitting, Cuff Size: Normal)   Pulse 77   Temp 97.9 F (36.6 C) (Oral)   Ht 5\' 8"  (1.727 m)   Wt 177 lb (80.3 kg)   SpO2 95%   BMI 26.91 kg/m    CC: CPE Subjective:    Patient ID: Albert Giles, male    DOB: [birth year], 72 y.o.   MRN: [id number]  HPI: Albert Giles is a 72 y.o. male presenting on 01/24/2017 for Annual Exam (Pt 2)   Recent shingles dx 11/2016 treated with valtrex. Not taking anything for pain, declines anything.  H/o hematuria - completed workup 2017, followed yearly by urology on flomax for BPH with LUTS.   Saw Lesia last week for medicare wellness visit. Note reviewed.    Preventative: Colon cancer screening - has fmhx but declines colonoscopy. iFOB stable.  Prostate cancer screening - known BPH - last check 03/2015 stable. rec against continued screening by urology.  Lung cancer screening - not eligible. CXR stable 2016 Flu shot yearly Prevnar 2015, pneumovax 2017 Td 2009 shingrix - discussed Advanced directives - doesn't have this set up. Would want wife then 2 daughters. Has packet at home - encouraged he fill this out.  Seat belt use discussed Sunscreen use discussed. No changing moles on skin.  Ex smoker 35 PY hx quit 2000.  Alcohol - none  Caffeine: 2 cups coffee, 2 cups tea/day Lives with wife Occupation: Higher education careers adviser, retired Edu: HS Activity: works in yard, walking 1 mi/day Diet: some water, fruits/vegetables daily  Relevant past medical, surgical, family and social history reviewed and updated as indicated. Interim medical history since our last visit reviewed. Allergies and medications reviewed and updated. Outpatient Medications Prior to Visit  Medication Sig Dispense Refill  . aspirin EC 81 MG tablet Take 1 tablet (81 mg total) by mouth daily. 60 tablet 1  . atorvastatin (LIPITOR) 40 MG tablet Take 1 tablet (40 mg total) by mouth daily at 6 PM. 60 tablet 1  . clopidogrel (PLAVIX)  75 MG tablet Take 1 tablet (75 mg total) by mouth daily. 60 tablet 1  . metoprolol tartrate (LOPRESSOR) 25 MG tablet Take 1 tablet (25 mg total) by mouth 2 (two) times daily. 60 tablet 1  . tamsulosin (FLOMAX) 0.4 MG CAPS capsule TAKE 1 CAPSULE BY MOUTH ONCE DAILY 90 capsule 0   No facility-administered medications prior to visit.      Per HPI unless specifically indicated in ROS section below Review of Systems  Constitutional: Negative for activity change, appetite change, chills, fatigue, fever and unexpected weight change.  HENT: Negative for hearing loss.   Eyes: Negative for visual disturbance.  Respiratory: Negative for cough, chest tightness, shortness of breath and wheezing.   Cardiovascular: Negative for chest pain, palpitations and leg swelling.  Gastrointestinal: Negative for abdominal distention, abdominal pain, blood in stool, constipation, diarrhea, nausea and vomiting.  Genitourinary: Negative for difficulty urinating and hematuria.  Musculoskeletal: Negative for arthralgias, myalgias and neck pain.  Skin: Negative for rash.  Neurological: Negative for dizziness, seizures, syncope and headaches.  Hematological: Negative for adenopathy. Does not bruise/bleed easily.  Psychiatric/Behavioral: Negative for dysphoric mood. The patient is not nervous/anxious.        Objective:    BP 118/74 (BP Location: Left Arm, Patient Position: Sitting, Cuff Size: Normal)   Pulse 77   Temp 97.9 F (36.6 C) (Oral)   Ht 5\' 8"  (1.727 m)   Wt 177 lb (  80.3 kg)   SpO2 95%   BMI 26.91 kg/m   Wt Readings from Last 3 Encounters:  01/24/17 177 lb (80.3 kg)  01/17/17 176 lb 8 oz (80.1 kg)  12/11/16 180 lb 12.8 oz (82 kg)    Physical Exam  Constitutional: He is oriented to person, place, and time. He appears well-developed and well-nourished. No distress.  HENT:  Head: Normocephalic and atraumatic.  Right Ear: Hearing, tympanic membrane, external ear and ear canal normal.  Left Ear:  Hearing, tympanic membrane, external ear and ear canal normal.  Nose: Nose normal.  Mouth/Throat: Uvula is midline, oropharynx is clear and moist and mucous membranes are normal. No oropharyngeal exudate, posterior oropharyngeal edema or posterior oropharyngeal erythema.  Eyes: Conjunctivae and EOM are normal. Pupils are equal, round, and reactive to light. No scleral icterus.  Neck: Normal range of motion. Neck supple. Carotid bruit is not present. No thyromegaly present.  Cardiovascular: Normal rate, regular rhythm, normal heart sounds and intact distal pulses.  No murmur heard. Pulses:      Radial pulses are 2+ on the right side, and 2+ on the left side.  Pulmonary/Chest: Effort normal and breath sounds normal. No respiratory distress. He has no wheezes. He has no rales.  Abdominal: Soft. Bowel sounds are normal. He exhibits no distension and no mass. There is no tenderness. There is no rebound and no guarding.  Musculoskeletal: Normal range of motion. He exhibits no edema.  Lymphadenopathy:    He has no cervical adenopathy.  Neurological: He is alert and oriented to person, place, and time.  CN grossly intact, station and gait intact  Skin: Skin is warm and dry. No rash noted.  Psychiatric: He has a normal mood and affect. His behavior is normal. Judgment and thought content normal.  Nursing note and vitals reviewed.  Results for orders placed or performed in visit on 01/17/17  VITAMIN D 25 Hydroxy (Vit-D Deficiency, Fractures)  Result Value Ref Range   VITD 31.82 30.00 - 100.00 ng/mL  Lipid panel  Result Value Ref Range   Cholesterol 110 0 - 200 mg/dL   Triglycerides 112.0 0.0 - 149.0 mg/dL   HDL 37.40 (L) >39.00 mg/dL   VLDL 22.4 0.0 - 40.0 mg/dL   LDL Cholesterol 50 0 - 99 mg/dL   Total CHOL/HDL Ratio 3    NonHDL 72.10   Comprehensive metabolic panel  Result Value Ref Range   Sodium 140 135 - 145 mEq/L   Potassium 4.1 3.5 - 5.1 mEq/L   Chloride 106 96 - 112 mEq/L   CO2 27  19 - 32 mEq/L   Glucose, Bld 96 70 - 99 mg/dL   BUN 13 6 - 23 mg/dL   Creatinine, Ser 1.39 0.40 - 1.50 mg/dL   Total Bilirubin 0.7 0.2 - 1.2 mg/dL   Alkaline Phosphatase 107 39 - 117 U/L   AST 13 0 - 37 U/L   ALT 13 0 - 53 U/L   Total Protein 6.7 6.0 - 8.3 g/dL   Albumin 4.0 3.5 - 5.2 g/dL   Calcium 9.0 8.4 - 10.5 mg/dL   GFR 53.17 (L) >60.00 mL/min      Assessment & Plan:  Recovering from recent shingles - declines further treatment at this time. Problem List Items Addressed This Visit    Advanced care planning/counseling discussion    Advanced directives - doesn't have this set up. Would want wife then 2 daughters. Has packet at home - encouraged he fill this out.  BPH with obstruction/lower urinary tract symptoms    Appreciate urology care - continue flomax      CAD (coronary artery disease)    Followed by Dr Ubaldo Glassing. H/o NSTEMI 2016. Continue DAPT, statin, beta blocker      CKD (chronic kidney disease) stage 3, GFR 30-59 ml/min (HCC)    Chronic, stable. Improved readings this year. Encouraged continue good hydration status and avoid NSAIDs and other nephrotoxins      Family history of colon cancer    Continues to decline colonoscopy although we discussed this is optimal test in fmhx - so will continue with iFOB.       Health maintenance examination - Primary    Preventative protocols reviewed and updated unless pt declined. Discussed healthy diet and lifestyle.       Hyperlipidemia    Chronic, stable. Continue lipitor.  The ASCVD Risk score Mikey Bussing DC Jr., et al., 2013) failed to calculate for the following reasons:   The patient has a prior MI or stroke diagnosis           Follow up plan: Return in about 1 year (around 01/24/2018) for annual exam, prior fasting for blood work, medicare wellness visit.  [name], MD

## 2017-01-28 ENCOUNTER — Other Ambulatory Visit (INDEPENDENT_AMBULATORY_CARE_PROVIDER_SITE_OTHER): Payer: Medicare HMO

## 2017-01-28 DIAGNOSIS — Z1211 Encounter for screening for malignant neoplasm of colon: Secondary | ICD-10-CM

## 2017-01-28 LAB — FECAL OCCULT BLOOD, IMMUNOCHEMICAL: Fecal Occult Bld: NEGATIVE

## 2017-01-28 LAB — FECAL OCCULT BLOOD, GUAIAC: FECAL OCCULT BLD: NEGATIVE

## 2017-01-29 ENCOUNTER — Encounter: Payer: Self-pay | Admitting: Family Medicine

## 2017-02-14 ENCOUNTER — Other Ambulatory Visit: Payer: Self-pay | Admitting: Family Medicine

## 2017-05-28 NOTE — Progress Notes (Deleted)
05/29/2017 1:13 PM   Albert Giles 02-Jun-1945 [id number]  Referring provider: [name], MD [place], [place] [id number]  No chief complaint on file.   HPI: 72 yo WM with a history of hematuria and BPH with LU TS who presents today for a one year follow up.  History of hematuria Blood on UA noted by PCP x several and remains present here.  No gross episodes. Former 30PY smoker. He is on plavix. Eval 2017 with CTU and cysto with bilobar prostate hypertrophy only.  UA today was unremarkable  BPH WITH LUTS His IPSS score today is ***, which is *** lower urinary tract symptomatology. He is *** with his quality life due to his urinary symptoms.   His previous I PSS score was 5/2.  His major complaint today is nocturia x 1.  He has had these symptoms for several years.  He denies any dysuria, hematuria or suprapubic pain.   He currently taking tamsulosin 0.4 mg daily.   He also denies any recent fevers, chills, nausea or vomiting.   Score:  1-7 Mild 8-19 Moderate 20-35 Severe   PMH: Past Medical History:  Diagnosis Date  . CAD (coronary artery disease) 06/2014   UA with NSTEMI - cath with 99% prox L circ s/p stent, EF 40% ([place], [place] at [facility])  . Emphysema    mild  . Ex-smoker   . NSTEMI (non-ST elevated myocardial infarction) ([place]) 07/13/2014    Surgical History: Past Surgical History:  Procedure Laterality Date  . CARDIAC CATHETERIZATION N/A 07/14/2014   Left Heart Cath and Coronary Angiography with stent placement;  Surgeon: Teodoro Spray, MD  . CARDIAC CATHETERIZATION N/A 07/14/2014   Coronary Stent Intervention;  Surgeon: Yolonda Kida, MD  . CYSTECTOMY     on back  . INGUINAL HERNIA REPAIR Right 08/17/04    Home Medications:  Allergies as of 05/29/2017   No Known Allergies     Medication List        Accurate as of 05/28/17  1:13 PM. Always use your most recent med list.          aspirin EC 81 MG tablet Take 1 tablet (81 mg  total) by mouth daily.   atorvastatin 40 MG tablet Commonly known as:  LIPITOR Take 1 tablet (40 mg total) by mouth daily at 6 PM.   clopidogrel 75 MG tablet Commonly known as:  PLAVIX Take 1 tablet (75 mg total) by mouth daily.   metoprolol tartrate 25 MG tablet Commonly known as:  LOPRESSOR Take 1 tablet (25 mg total) by mouth 2 (two) times daily.   tamsulosin 0.4 MG Caps capsule Commonly known as:  FLOMAX TAKE 1 CAPSULE BY MOUTH ONCE DAILY       Allergies: No Known Allergies  Family History: Family History  Problem Relation Age of Onset  . Cancer Mother 77       colon  . Cancer Sister        breast  . CAD Neg Hx   . Stroke Neg Hx   . Diabetes Neg Hx   . Prostate cancer Neg Hx   . Kidney cancer Neg Hx   . Bladder Cancer Neg Hx     Social History:  reports that he quit smoking about 18 years ago. His smoking use included cigarettes. He has a 35.00 pack-year smoking history. He has never used smokeless tobacco. He reports that he does not drink alcohol or use drugs.  ROS:                                        Physical Exam: There were no vitals taken for this visit.  Constitutional:  Alert and oriented, No acute distress. HEENT: [place] AT, moist mucus membranes.  Trachea midline, no masses. Cardiovascular: No clubbing, cyanosis, or edema. Respiratory: Normal respiratory effort, no increased work of breathing. GI: Abdomen is soft, nontender, nondistended, no abdominal masses GU: No CVA tenderness. Non-circ'd, phallus straight. Testes down w/o masses. DRE 45gm smooth. Skin: No rashes, bruises or suspicious lesions. Lymph: No cervical or inguinal adenopathy. Neurologic: Grossly intact, no focal deficits, moving all 4 extremities. Psychiatric: Normal mood and affect.  Constitutional: Well nourished. Alert and oriented, No acute distress. HEENT: [place] AT, moist mucus membranes. Trachea midline, no masses. Cardiovascular: No clubbing, cyanosis,  or edema. Respiratory: Normal respiratory effort, no increased work of breathing. GI: Abdomen is soft, non tender, non distended, no abdominal masses. Liver and spleen not palpable.  No hernias appreciated.  Stool sample for occult testing is not indicated.   GU: No CVA tenderness.  No bladder fullness or masses.  Patient with circumcised/uncircumcised phallus. ***Foreskin easily retracted***  Urethral meatus is patent.  No penile discharge. No penile lesions or rashes. Scrotum without lesions, cysts, rashes and/or edema.  Testicles are located scrotally bilaterally. No masses are appreciated in the testicles. Left and right epididymis are normal. Rectal: Patient with  normal sphincter tone. Anus and perineum without scarring or rashes. No rectal masses are appreciated. Prostate is approximately *** grams, *** nodules are appreciated. Seminal vesicles are normal. Skin: No rashes, bruises or suspicious lesions. Lymph: No cervical or inguinal adenopathy. Neurologic: Grossly intact, no focal deficits, moving all 4 extremities. Psychiatric: Normal mood and affect.   Laboratory Data: Lab Results  Component Value Date   WBC 8.9 12/25/2015   HGB 15.4 12/25/2015   HCT 45.5 12/25/2015   MCV 91.0 12/25/2015   PLT 251.0 12/25/2015    Lab Results  Component Value Date   CREATININE 1.39 01/17/2017    Lab Results  Component Value Date   PSA 3.25 03/20/2015    Lab Results  Component Value Date   HGBA1C 5.5 07/14/2014    Urinalysis Unremarkable.  See EPIC I have reviewed the labs  Assessment & Plan:    1.  History of hematuria  - completed workup in 2017 with CTU and cystoscopy - no worrisome findings - bilobar prostate hypertrophy   - UA today was unremarkable  - RTC in one year for UA  2. BPH with LUTS  - IPSS score is 5/2  - Continue conservative management, avoiding bladder irritants and timed voiding's  - most bothersome symptoms is/are nocturia x 1, but it is down from 4 times  nightly  - Continue tamsulosin 0.4 mg daily  - RTC in 12 months for I PSS and exam   No follow-ups on file.  Zara Council, PA-C  [facility] [address] [place], [place] [address] [phone]

## 2017-05-29 ENCOUNTER — Ambulatory Visit: Payer: Self-pay | Admitting: Urology

## 2017-06-02 ENCOUNTER — Ambulatory Visit: Payer: Medicare Other | Admitting: Urology

## 2017-07-02 NOTE — Progress Notes (Signed)
07/03/2017 1:08 PM   Albert Giles [birth year] [id number]  Referring provider: [name], MD [address] [place], [place] [address]  Chief Complaint  Patient presents with  . Benign Prostatic Hypertrophy    HPI: 72 yo WM with a history of hematuria and BPH with LU TS who presents today for a one year follow up.  History of hematuria Blood on UA noted by PCP x several and remains present here.  No gross episodes. Former 30PY smoker. He is on plavix. Eval 2017 with CTU and cysto with bilobar prostate hypertrophy only.  UA today was positive for 3-10 RBC's.    BPH WITH LUTS His IPSS score today is 5, which is mild lower urinary tract symptomatology. He is mostly satisfied with his quality life due to his urinary symptoms.   His previous I PSS score was 5/2.  He has no urinary complaints at this time.  He denies any dysuria, hematuria or suprapubic pain.   He currently taking tamsulosin 0.4 mg daily.   He also denies any recent fevers, chills, nausea or vomiting. IPSS    Row Name 07/03/17 1400         International Prostate Symptom Score   How often have you had the sensation of not emptying your bladder?  Less than 1 in 5     How often have you had to urinate less than every two hours?  Less than half the time     How often have you found you stopped and started again several times when you urinated?  Less than 1 in 5 times     How often have you found it difficult to postpone urination?  Not at All     How often have you had a weak urinary stream?  Not at All     How often have you had to strain to start urination?  Not at All     How many times did you typically get up at night to urinate?  1 Time     Total IPSS Score  5       Quality of Life due to urinary symptoms   If you were to spend the rest of your life with your urinary condition just the way it is now how would you feel about that?  Mostly Satisfied        Score:  1-7 Mild 8-19 Moderate 20-35  Severe   PMH: Past Medical History:  Diagnosis Date  . CAD (coronary artery disease) 06/2014   UA with NSTEMI - cath with 99% prox L circ s/p stent, EF 40% ([facility], [place] at [facility])  . Emphysema    mild  . Ex-smoker   . NSTEMI (non-ST elevated myocardial infarction) ([place]) 07/13/2014    Surgical History: Past Surgical History:  Procedure Laterality Date  . CARDIAC CATHETERIZATION N/A 07/14/2014   Left Heart Cath and Coronary Angiography with stent placement;  Surgeon: Teodoro Spray, MD  . CARDIAC CATHETERIZATION N/A 07/14/2014   Coronary Stent Intervention;  Surgeon: Yolonda Kida, MD  . CYSTECTOMY     on back  . INGUINAL HERNIA REPAIR Right 08/17/04    Home Medications:  Allergies as of 07/03/2017   No Known Allergies     Medication List        Accurate as of 07/03/17 11:59 PM. Always use your most recent med list.          aspirin EC 81 MG tablet Take 1 tablet (  81 mg total) by mouth daily.   atorvastatin 40 MG tablet Commonly known as:  LIPITOR Take 1 tablet (40 mg total) by mouth daily at 6 PM.   clopidogrel 75 MG tablet Commonly known as:  PLAVIX Take 1 tablet (75 mg total) by mouth daily.   metoprolol tartrate 25 MG tablet Commonly known as:  LOPRESSOR Take 1 tablet (25 mg total) by mouth 2 (two) times daily.   tamsulosin 0.4 MG Caps capsule Commonly known as:  FLOMAX Take 1 capsule (0.4 mg total) by mouth daily.       Allergies: No Known Allergies  Family History: Family History  Problem Relation Age of Onset  . Cancer Mother 38       colon  . Cancer Sister        breast  . CAD Neg Hx   . Stroke Neg Hx   . Diabetes Neg Hx   . Prostate cancer Neg Hx   . Kidney cancer Neg Hx   . Bladder Cancer Neg Hx     Social History:  reports that he quit smoking about 19 years ago. His smoking use included cigarettes. He has a 35.00 pack-year smoking history. He has never used smokeless tobacco. He reports that he does not drink alcohol or use  drugs.  ROS: UROLOGY Frequent Urination?: No Hard to postpone urination?: No Burning/pain with urination?: No Get up at night to urinate?: No Leakage of urine?: No Urine stream starts and stops?: No Trouble starting stream?: No Do you have to strain to urinate?: No Blood in urine?: No Urinary tract infection?: No Sexually transmitted disease?: No Injury to kidneys or bladder?: No Painful intercourse?: No Weak stream?: No Erection problems?: No Penile pain?: No  Gastrointestinal Nausea?: No Vomiting?: No Indigestion/heartburn?: No Diarrhea?: No Constipation?: No  Constitutional Fever: No Night sweats?: No Weight loss?: No Fatigue?: No  Skin Skin rash/lesions?: No Itching?: No  Eyes Blurred vision?: No Double vision?: No  Ears/Nose/Throat Sore throat?: No Sinus problems?: No  Hematologic/Lymphatic Swollen glands?: No Easy bruising?: No  Cardiovascular Leg swelling?: No Chest pain?: No  Respiratory Cough?: No Shortness of breath?: No  Endocrine Excessive thirst?: No  Musculoskeletal Back pain?: No Joint pain?: No  Neurological Headaches?: No Dizziness?: No  Psychologic Depression?: No Anxiety?: No  Physical Exam: BP 129/80   Pulse 70   Resp 16   Ht 5\' 8"  (1.727 m)   Wt 178 lb 6.4 oz (80.9 kg)   SpO2 96%   BMI 27.13 kg/m   Constitutional: Well nourished. Alert and oriented, No acute distress. HEENT: [place] AT, moist mucus membranes. Trachea midline, no masses. Cardiovascular: No clubbing, cyanosis, or edema. Respiratory: Normal respiratory effort, no increased work of breathing. GI: Abdomen is soft, non tender, non distended, no abdominal masses. Liver and spleen not palpable.  No hernias appreciated.  Stool sample for occult testing is not indicated.   GU: No CVA tenderness.  No bladder fullness or masses.  Patient with uncircumcised phallus.   Foreskin easily retracted Urethral meatus is patent.  No penile discharge. No penile lesions  or rashes. Scrotum without lesions, cysts, rashes and/or edema.  Testicles are located scrotally bilaterally. No masses are appreciated in the testicles. Left and right epididymis are normal. Rectal: Patient with  normal sphincter tone. Anus and perineum without scarring or rashes. No rectal masses are appreciated. Prostate is approximately 45 grams, no nodules are appreciated. Seminal vesicles are normal. Skin: No rashes, bruises or suspicious lesions. Lymph: No cervical  or inguinal adenopathy. Neurologic: Grossly intact, no focal deficits, moving all 4 extremities. Psychiatric: Normal mood and affect.   Laboratory Data: Lab Results  Component Value Date   WBC 8.9 12/25/2015   HGB 15.4 12/25/2015   HCT 45.5 12/25/2015   MCV 91.0 12/25/2015   PLT 251.0 12/25/2015    Lab Results  Component Value Date   CREATININE 1.39 01/17/2017    Lab Results  Component Value Date   PSA 3.25 03/20/2015    Lab Results  Component Value Date   HGBA1C 5.5 07/14/2014    Urinalysis 3-10 RBC's  See Epic    I have reviewed the labs.   Assessment & Plan:    1.  History of hematuria Completed workup in 2017 with CTU and cystoscopy - no worrisome findings - bilobar prostate hypertrophy UA today positive for 3-10 RBC's   Will obtain a RUS and repeat cystoscopy   2. BPH with LUTS  - IPSS score is 5/2, it is stable  - Continue conservative management, avoiding bladder irritants and timed voiding's  - most bothersome symptoms is/are nocturia x 1, but it is down from 4 times nightly  - Continue tamsulosin 0.4 mg daily  - RTC in 12 months for I PSS and exam   Return for RUS report and cystoscopy .  Zara Council, PA-C  [facility] [address] [place], [place] [address] [phone]

## 2017-07-03 ENCOUNTER — Encounter: Payer: Self-pay | Admitting: Urology

## 2017-07-03 ENCOUNTER — Ambulatory Visit: Payer: Medicare HMO | Admitting: Urology

## 2017-07-03 VITALS — BP 129/80 | HR 70 | Resp 16 | Ht 68.0 in | Wt 178.4 lb

## 2017-07-03 DIAGNOSIS — Z87448 Personal history of other diseases of urinary system: Secondary | ICD-10-CM

## 2017-07-03 DIAGNOSIS — N401 Enlarged prostate with lower urinary tract symptoms: Secondary | ICD-10-CM | POA: Diagnosis not present

## 2017-07-03 DIAGNOSIS — N138 Other obstructive and reflux uropathy: Secondary | ICD-10-CM | POA: Diagnosis not present

## 2017-07-03 LAB — URINALYSIS, COMPLETE
Bilirubin, UA: NEGATIVE
Glucose, UA: NEGATIVE
Ketones, UA: NEGATIVE
LEUKOCYTES UA: NEGATIVE
Nitrite, UA: NEGATIVE
PH UA: 5 (ref 5.0–7.5)
Protein, UA: NEGATIVE
Specific Gravity, UA: 1.025 (ref 1.005–1.030)
Urobilinogen, Ur: 0.2 mg/dL (ref 0.2–1.0)

## 2017-07-03 LAB — MICROSCOPIC EXAMINATION
Bacteria, UA: NONE SEEN
EPITHELIAL CELLS (NON RENAL): NONE SEEN /HPF (ref 0–10)
WBC UA: NONE SEEN /HPF (ref 0–5)

## 2017-07-03 MED ORDER — TAMSULOSIN HCL 0.4 MG PO CAPS: 0.4000 mg | ORAL_CAPSULE | Freq: Every day | ORAL | 3 refills | Status: DC

## 2017-07-04 ENCOUNTER — Telehealth: Payer: Self-pay

## 2017-07-04 NOTE — Telephone Encounter (Signed)
Called pt no answer. LM for pt informing him of the information below. Advised pt to call back for questions or concerns or if he desire to start medication.

## 2017-07-04 NOTE — Telephone Encounter (Signed)
-----   Message from Nori Riis, PA-C sent at 07/04/2017  7:53 AM EDT ----- Please let Mr. Hodgdon know that his urine was positive for blood.  He has the option of starting finasteride at this time, but if he is satisfied with his urinary symptoms he may choose not to take that medication.  I would like him to report any gross hematuria or worsening of his urinary symptoms prior to his follow up appointment next year.

## 2017-07-07 ENCOUNTER — Telehealth: Payer: Self-pay | Admitting: Urology

## 2017-07-07 NOTE — Telephone Encounter (Signed)
Pt returning call from 07/04/2017, informed pt of the results from previous telephone encounter. Pt gave verbal understanding states that he will postpone finasteride for now as he is not symptomatic.

## 2017-07-16 ENCOUNTER — Telehealth: Payer: Self-pay | Admitting: Urology

## 2017-07-16 DIAGNOSIS — R3129 Other microscopic hematuria: Secondary | ICD-10-CM

## 2017-07-16 NOTE — Telephone Encounter (Signed)
Appointment made for cystoscopy. Made patient aware of this appointment & that [facility] Radiology scheduling office will call to schedule RUS. Patient voices understanding.

## 2017-07-16 NOTE — Telephone Encounter (Signed)
I spoke with [name] and let him know that we would like to get a RUS and a cystoscopy done as he is still having microscopic hematuria.

## 2017-07-30 ENCOUNTER — Ambulatory Visit: Payer: Medicare HMO

## 2017-07-31 ENCOUNTER — Ambulatory Visit
Admission: RE | Admit: 2017-07-31 | Discharge: 2017-07-31 | Disposition: A | Discharge status: Home / Self Care | Payer: Medicare HMO | Source: Ambulatory Visit | Attending: Urology | Admitting: Urology

## 2017-07-31 DIAGNOSIS — N281 Cyst of kidney, acquired: Secondary | ICD-10-CM | POA: Insufficient documentation

## 2017-07-31 DIAGNOSIS — N400 Enlarged prostate without lower urinary tract symptoms: Secondary | ICD-10-CM | POA: Diagnosis not present

## 2017-07-31 DIAGNOSIS — R3129 Other microscopic hematuria: Secondary | ICD-10-CM | POA: Insufficient documentation

## 2017-08-13 ENCOUNTER — Other Ambulatory Visit: Payer: Medicare HMO | Admitting: Urology

## 2017-09-11 DIAGNOSIS — Z9861 Coronary angioplasty status: Secondary | ICD-10-CM | POA: Diagnosis not present

## 2017-09-11 DIAGNOSIS — E785 Hyperlipidemia, unspecified: Secondary | ICD-10-CM | POA: Diagnosis not present

## 2017-09-11 DIAGNOSIS — I2510 Atherosclerotic heart disease of native coronary artery without angina pectoris: Secondary | ICD-10-CM | POA: Diagnosis not present

## 2017-09-17 ENCOUNTER — Encounter: Payer: Self-pay | Admitting: Urology

## 2017-09-17 ENCOUNTER — Ambulatory Visit: Payer: Medicare HMO | Admitting: Urology

## 2017-09-17 VITALS — BP 130/81 | HR 65 | Ht 68.0 in | Wt 179.0 lb

## 2017-09-17 DIAGNOSIS — R3129 Other microscopic hematuria: Secondary | ICD-10-CM

## 2017-09-17 DIAGNOSIS — N2889 Other specified disorders of kidney and ureter: Secondary | ICD-10-CM

## 2017-09-17 LAB — URINALYSIS, COMPLETE
Bilirubin, UA: NEGATIVE
Glucose, UA: NEGATIVE
Ketones, UA: NEGATIVE
LEUKOCYTES UA: NEGATIVE
Nitrite, UA: NEGATIVE
PH UA: 5.5 (ref 5.0–7.5)
PROTEIN UA: NEGATIVE
Specific Gravity, UA: 1.02 (ref 1.005–1.030)
Urobilinogen, Ur: 0.2 mg/dL (ref 0.2–1.0)

## 2017-09-17 LAB — MICROSCOPIC EXAMINATION
EPITHELIAL CELLS (NON RENAL): NONE SEEN /HPF (ref 0–10)
WBC, UA: NONE SEEN /hpf (ref 0–5)

## 2017-09-17 MED ORDER — LIDOCAINE HCL URETHRAL/MUCOSAL 2 % EX GEL: 1.0000 "application " | Freq: Once | CUTANEOUS | Status: DC

## 2017-09-17 MED ORDER — CIPROFLOXACIN HCL 500 MG PO TABS
500.0000 mg | ORAL_TABLET | Freq: Once | ORAL | Status: DC
Start: 2017-09-17 — End: 2019-04-23

## 2017-09-17 NOTE — Progress Notes (Signed)
   09/17/17  CC:  Chief Complaint  Patient presents with  . Cysto   Indications: 72 year old male with asymptomatic microhematuria.  He had a negative evaluation in 2017.  He recently saw Zara Council and noted to have 3-10 RBCs on urinalysis.  A renal ultrasound was performed and repeat cystoscopy recommended.   HPI: He has no complaints today.  Denies gross hematuria.  Renal ultrasound performed 07/31/2017 showed a 10 mm hypoechoic focus in the right kidney with faint internal echoes.  He also had a right simple cyst.  Blood pressure 130/81, pulse 65, height 5\' 8"  (1.727 m), weight 179 lb (81.2 kg). NED. A&Ox3.     Cystoscopy Procedure Note  Patient identification was confirmed, informed consent was obtained, and patient was prepped using Betadine solution.  Lidocaine jelly was administered per urethral meatus.    Preoperative abx where received prior to procedure.     Pre-Procedure: - Inspection reveals a normal caliber ureteral meatus.  Procedure: The flexible cystoscope was introduced without difficulty - No urethral strictures/lesions are present. - Lateral lobe enlargement prostate; prominent hypervascularity - Mild bladder neck elevation - Bilateral ureteral orifices identified - Bladder mucosa  reveals no ulcers, tumors, or lesions - No bladder stones - No trabeculation  Retroflexion shows small intravesical median lobe   Post-Procedure: - Patient tolerated the procedure well  Assessment/ Plan: 72 year old male with microhematuria.  Cystoscopy today is unremarkable.  Ultrasound did show a new 10 mm hypoechoic focus in the mid right kidney with some internal echoes which was not noted on previous CT.  Will proceed with a renal mass protocol MRI and he will be notified with results.

## 2017-09-18 ENCOUNTER — Encounter: Payer: Self-pay | Admitting: Urology

## 2017-09-18 ENCOUNTER — Telehealth: Payer: Self-pay

## 2017-09-18 NOTE — Telephone Encounter (Signed)
Left message for pt to call our office and speak to clinical staff.

## 2017-09-18 NOTE — Telephone Encounter (Signed)
-----   Message from Abbie Sons, MD sent at 09/18/2017  7:08 AM EDT ----- Please let patient know I reviewed his renal ultrasound and the small mass was not present at the time of his CT.  The renal cyst on CT was also seen on ultrasound in addition to this new mass.  Would therefore recommend an abdominal MRI.  An order has been placed.  Will call with results.

## 2017-09-23 NOTE — Telephone Encounter (Signed)
Pt informed, please arrange MRI

## 2017-10-15 ENCOUNTER — Ambulatory Visit
Admission: RE | Admit: 2017-10-15 | Discharge: 2017-10-15 | Disposition: A | Discharge status: Home / Self Care | Payer: Medicare HMO | Source: Ambulatory Visit | Attending: Urology | Admitting: Urology

## 2017-10-15 DIAGNOSIS — N281 Cyst of kidney, acquired: Secondary | ICD-10-CM | POA: Insufficient documentation

## 2017-10-15 DIAGNOSIS — K449 Diaphragmatic hernia without obstruction or gangrene: Secondary | ICD-10-CM | POA: Insufficient documentation

## 2017-10-15 DIAGNOSIS — N2889 Other specified disorders of kidney and ureter: Secondary | ICD-10-CM | POA: Diagnosis not present

## 2017-10-15 MED ORDER — GADOBENATE DIMEGLUMINE 529 MG/ML IV SOLN
10.0000 mL | Freq: Once | INTRAVENOUS | Status: AC | PRN
  Administered 2017-10-15: 8 mL via INTRAVENOUS

## 2017-10-17 ENCOUNTER — Telehealth: Payer: Self-pay | Admitting: Family Medicine

## 2017-10-17 LAB — POCT I-STAT CREATININE: Creatinine, Ser: 1.6 mg/dL — ABNORMAL HIGH (ref 0.61–1.24)

## 2017-10-17 NOTE — Telephone Encounter (Signed)
-----   Message from Abbie Sons, MD sent at 10/17/2017  8:28 AM EDT ----- MRI showed benign-appearing cysts.  No further evaluation needed.  Follow-up as scheduled.

## 2017-10-17 NOTE — Telephone Encounter (Signed)
LMOM for patient to return call.

## 2017-10-21 NOTE — Telephone Encounter (Signed)
Patient notified

## 2017-10-23 ENCOUNTER — Ambulatory Visit: Payer: Medicare HMO

## 2017-10-30 ENCOUNTER — Ambulatory Visit: Payer: Medicare HMO

## 2017-11-06 ENCOUNTER — Ambulatory Visit (INDEPENDENT_AMBULATORY_CARE_PROVIDER_SITE_OTHER): Payer: Medicare HMO

## 2017-11-06 DIAGNOSIS — Z23 Encounter for immunization: Secondary | ICD-10-CM | POA: Diagnosis not present

## 2018-01-20 ENCOUNTER — Other Ambulatory Visit: Payer: Self-pay | Admitting: Family Medicine

## 2018-01-20 DIAGNOSIS — N183 Chronic kidney disease, stage 3 unspecified: Secondary | ICD-10-CM

## 2018-01-20 DIAGNOSIS — E785 Hyperlipidemia, unspecified: Secondary | ICD-10-CM

## 2018-01-20 DIAGNOSIS — N138 Other obstructive and reflux uropathy: Secondary | ICD-10-CM

## 2018-01-20 DIAGNOSIS — N401 Enlarged prostate with lower urinary tract symptoms: Secondary | ICD-10-CM

## 2018-01-22 ENCOUNTER — Ambulatory Visit: Payer: Medicare HMO

## 2018-01-22 ENCOUNTER — Other Ambulatory Visit (INDEPENDENT_AMBULATORY_CARE_PROVIDER_SITE_OTHER): Payer: Medicare HMO

## 2018-01-22 DIAGNOSIS — N138 Other obstructive and reflux uropathy: Secondary | ICD-10-CM

## 2018-01-22 DIAGNOSIS — N401 Enlarged prostate with lower urinary tract symptoms: Secondary | ICD-10-CM | POA: Diagnosis not present

## 2018-01-22 DIAGNOSIS — E785 Hyperlipidemia, unspecified: Secondary | ICD-10-CM | POA: Diagnosis not present

## 2018-01-22 DIAGNOSIS — N183 Chronic kidney disease, stage 3 unspecified: Secondary | ICD-10-CM

## 2018-01-22 LAB — COMPREHENSIVE METABOLIC PANEL
ALBUMIN: 4.1 g/dL (ref 3.5–5.2)
ALT: 15 U/L (ref 0–53)
AST: 16 U/L (ref 0–37)
Alkaline Phosphatase: 106 U/L (ref 39–117)
BUN: 15 mg/dL (ref 6–23)
CHLORIDE: 106 meq/L (ref 96–112)
CO2: 27 meq/L (ref 19–32)
CREATININE: 1.51 mg/dL — AB (ref 0.40–1.50)
Calcium: 9.1 mg/dL (ref 8.4–10.5)
GFR: 48.19 mL/min — ABNORMAL LOW (ref >60.00)
Glucose, Bld: 95 mg/dL (ref 70–99)
POTASSIUM: 4.1 meq/L (ref 3.5–5.1)
Sodium: 139 mEq/L (ref 135–145)
Total Bilirubin: 0.5 mg/dL (ref 0.2–1.2)
Total Protein: 6.3 g/dL (ref 6.0–8.3)

## 2018-01-22 LAB — LIPID PANEL
CHOL/HDL RATIO: 3
CHOLESTEROL: 112 mg/dL (ref 0–200)
HDL: 38 mg/dL — ABNORMAL LOW (ref >39.00)
LDL CALC: 44 mg/dL (ref 0–99)
NonHDL: 74.01
Triglycerides: 148 mg/dL (ref 0.0–149.0)
VLDL: 29.6 mg/dL (ref 0.0–40.0)

## 2018-01-22 LAB — CBC WITH DIFFERENTIAL/PLATELET
BASOS PCT: 1.1 % (ref 0.0–3.0)
Basophils Absolute: 0.1 10*3/uL (ref 0.0–0.1)
EOS ABS: 0.3 10*3/uL (ref 0.0–0.7)
EOS PCT: 3.3 % (ref 0.0–5.0)
HCT: 41.5 % (ref 39.0–52.0)
Hemoglobin: 13.8 g/dL (ref 13.0–17.0)
LYMPHS ABS: 2.2 10*3/uL (ref 0.7–4.0)
Lymphocytes Relative: 29.4 % (ref 12.0–46.0)
MCHC: 33.3 g/dL (ref 30.0–36.0)
MCV: 92.8 fl (ref 78.0–100.0)
Monocytes Absolute: 0.6 10*3/uL (ref 0.1–1.0)
Monocytes Relative: 8 % (ref 3.0–12.0)
NEUTROS PCT: 58.2 % (ref 43.0–77.0)
Neutro Abs: 4.4 10*3/uL (ref 1.4–7.7)
PLATELETS: 213 10*3/uL (ref 150.0–400.0)
RBC: 4.47 Mil/uL (ref 4.22–5.81)
RDW: 14.1 % (ref 11.5–15.5)
WBC: 7.5 10*3/uL (ref 4.0–10.5)

## 2018-01-22 LAB — MICROALBUMIN / CREATININE URINE RATIO
CREATININE, U: 65.1 mg/dL
Microalb Creat Ratio: 1.1 mg/g (ref 0.0–30.0)

## 2018-01-22 LAB — VITAMIN D 25 HYDROXY (VIT D DEFICIENCY, FRACTURES): VITD: 32.88 ng/mL (ref 30.00–100.00)

## 2018-01-22 LAB — PSA: PSA: 1.73 ng/mL (ref 0.10–4.00)

## 2018-01-27 ENCOUNTER — Encounter: Payer: Self-pay | Admitting: Family Medicine

## 2018-01-27 ENCOUNTER — Ambulatory Visit (INDEPENDENT_AMBULATORY_CARE_PROVIDER_SITE_OTHER): Payer: Medicare HMO | Admitting: Family Medicine

## 2018-01-27 VITALS — BP 116/74 | HR 58 | Temp 97.6°F | Ht 67.25 in | Wt 174.0 lb

## 2018-01-27 DIAGNOSIS — N183 Chronic kidney disease, stage 3 unspecified: Secondary | ICD-10-CM

## 2018-01-27 DIAGNOSIS — Z0000 Encounter for general adult medical examination without abnormal findings: Secondary | ICD-10-CM | POA: Diagnosis not present

## 2018-01-27 DIAGNOSIS — E785 Hyperlipidemia, unspecified: Secondary | ICD-10-CM

## 2018-01-27 DIAGNOSIS — Z1211 Encounter for screening for malignant neoplasm of colon: Secondary | ICD-10-CM | POA: Diagnosis not present

## 2018-01-27 DIAGNOSIS — I2510 Atherosclerotic heart disease of native coronary artery without angina pectoris: Secondary | ICD-10-CM

## 2018-01-27 DIAGNOSIS — N401 Enlarged prostate with lower urinary tract symptoms: Secondary | ICD-10-CM | POA: Diagnosis not present

## 2018-01-27 DIAGNOSIS — N138 Other obstructive and reflux uropathy: Secondary | ICD-10-CM

## 2018-01-27 DIAGNOSIS — Z7189 Other specified counseling: Secondary | ICD-10-CM | POA: Diagnosis not present

## 2018-01-27 DIAGNOSIS — Z800 Family history of malignant neoplasm of digestive organs: Secondary | ICD-10-CM

## 2018-01-27 NOTE — Assessment & Plan Note (Signed)
Chronic, stable. Reviewed with patient. Encouraged good hydration status, pt knows to avoid NSAIDs and other nephrotoxins.

## 2018-01-27 NOTE — Assessment & Plan Note (Signed)
Preventative protocols reviewed and updated unless pt declined. Discussed healthy diet and lifestyle.  

## 2018-01-27 NOTE — Assessment & Plan Note (Addendum)

## 2018-01-27 NOTE — Assessment & Plan Note (Addendum)
Advanced directives - doesn't have this set up. Would want wife then 2 daughters. Would not want prolonged life support if terminal condition. Has packet at home - encouraged he review with family.  

## 2018-01-27 NOTE — Assessment & Plan Note (Signed)
Declines colonoscopy. Will continue iFOB.

## 2018-01-27 NOTE — Assessment & Plan Note (Addendum)
Chronic, stable. Continue statin. The ASCVD Risk score Albert Bussing DC Jr., et al., 2013) failed to calculate for the following reasons:   The patient has a prior MI or stroke diagnosis

## 2018-01-27 NOTE — Progress Notes (Signed)
BP 116/74 (BP Location: Left Arm, Patient Position: Sitting, Cuff Size: Normal)   Pulse (!) 58   Temp 97.6 F (36.4 C) (Oral)   Ht 5' 7.25" (1.708 m)   Wt 174 lb (78.9 kg)   SpO2 95%   BMI 27.05 kg/m    CC: AMW/CPE Subjective:    Patient ID: Albert Giles, male    DOB: Dec 03, 1945, 73 y.o.   MRN: [id number]  HPI: Albert Giles is a 73 y.o. male presenting on 01/27/2018 for Medicare Wellness   Did not see Katha Cabal this year.  Hematuria - sees urology regularly. Latest MRI reviewed - reassuring.  Cardiology through [facility]).    Hearing Screening   125Hz  250Hz  500Hz  1000Hz  2000Hz  3000Hz  4000Hz  6000Hz  8000Hz   Right ear:   25 40 40  0    Left ear:   40 40 0  0      Visual Acuity Screening   Right eye Left eye Both eyes  Without correction: 20/20 20/20 20/20   With correction:         Office Visit from 01/27/2018 in [place] at [facility] Total Score  0    Passes fall risk  Preventative: Colon cancer screening - has fmhx but declines colonoscopy.iFOB stable.  Prostate cancer screening -known BPH - last check 03/2015 stable. rec against continued screening by urology. Lung cancer screening - not eligible. CXR stable 2016 Flu shot yearly Prevnar 2015, pneumovax 2017 Td 2009 Shingrix - discussed Advanced directives - doesn't have this set up. Would want wife then 2 daughters. Would not want prolonged life support if terminal condition. Has packet at home - encouraged he review with family. Seat belt use discussed Sunscreen use discussed. No changing moles on skin.  Ex smoker 35 PY hx quit 2000.  Alcohol - none Dentist q6 mo Eye exam yearly  Caffeine: 2 cups coffee, 2 cups tea/day Lives with wife Occupation: Higher education careers adviser, retired Edu: HS Activity: works in yard, [employer] mi/day Diet: some water, fruits/vegetables daily      Relevant past medical, surgical, family and social history reviewed and updated as indicated. Interim medical  history since our last visit reviewed. Allergies and medications reviewed and updated. Outpatient Medications Prior to Visit  Medication Sig Dispense Refill  . aspirin EC 81 MG tablet Take 1 tablet (81 mg total) by mouth daily. 60 tablet 1  . atorvastatin (LIPITOR) 40 MG tablet Take 1 tablet (40 mg total) by mouth daily at 6 PM. 60 tablet 1  . clopidogrel (PLAVIX) 75 MG tablet Take 1 tablet (75 mg total) by mouth daily. 60 tablet 1  . metoprolol tartrate (LOPRESSOR) 25 MG tablet Take 1 tablet (25 mg total) by mouth 2 (two) times daily. 60 tablet 1  . tamsulosin (FLOMAX) 0.4 MG CAPS capsule Take 1 capsule (0.4 mg total) by mouth daily. 90 capsule 3   Facility-Administered Medications Prior to Visit  Medication Dose Route Frequency Provider Last Rate Last Dose  . ciprofloxacin (CIPRO) tablet 500 mg  500 mg Oral Once Stoioff, Scott C, MD      . lidocaine (XYLOCAINE) 2 % jelly 1 application  1 application Urethral Once Stoioff, Scott C, MD         Per HPI unless specifically indicated in ROS section below Review of Systems  Constitutional: Negative for activity change, appetite change, chills, fatigue, fever and unexpected weight change.  HENT: Negative for hearing loss.   Eyes: Negative for visual disturbance.  Respiratory:  Negative for cough, chest tightness, shortness of breath and wheezing.   Cardiovascular: Negative for chest pain, palpitations and leg swelling.  Gastrointestinal: Negative for abdominal distention, abdominal pain, blood in stool, constipation, diarrhea, nausea and vomiting.  Genitourinary: Negative for difficulty urinating and hematuria.  Musculoskeletal: Negative for arthralgias, myalgias and neck pain.  Skin: Negative for rash.  Neurological: Negative for dizziness, seizures, syncope and headaches.  Hematological: Negative for adenopathy. Does not bruise/bleed easily.  Psychiatric/Behavioral: Negative for dysphoric mood. The patient is not nervous/anxious.     Objective:    BP 116/74 (BP Location: Left Arm, Patient Position: Sitting, Cuff Size: Normal)   Pulse (!) 58   Temp 97.6 F (36.4 C) (Oral)   Ht 5' 7.25" (1.708 m)   Wt 174 lb (78.9 kg)   SpO2 95%   BMI 27.05 kg/m   Wt Readings from Last 3 Encounters:  01/27/18 174 lb (78.9 kg)  09/17/17 179 lb (81.2 kg)  07/03/17 178 lb 6.4 oz (80.9 kg)    Physical Exam Vitals signs and nursing note reviewed.  Constitutional:      General: He is not in acute distress.    Appearance: He is well-developed.  HENT:     Head: Normocephalic and atraumatic.     Right Ear: Hearing, tympanic membrane, ear canal and external ear normal.     Left Ear: Hearing, tympanic membrane, ear canal and external ear normal.     Nose: Nose normal.     Mouth/Throat:     Pharynx: Uvula midline. No oropharyngeal exudate or posterior oropharyngeal erythema.  Eyes:     General: No scleral icterus.    Conjunctiva/sclera: Conjunctivae normal.     Pupils: Pupils are equal, round, and reactive to light.  Neck:     Musculoskeletal: Normal range of motion and neck supple.     Vascular: No carotid bruit.  Cardiovascular:     Rate and Rhythm: Normal rate and regular rhythm.     Pulses:          Radial pulses are 2+ on the right side and 2+ on the left side.     Heart sounds: Normal heart sounds. No murmur.  Pulmonary:     Effort: Pulmonary effort is normal. No respiratory distress.     Breath sounds: Normal breath sounds. No wheezing or rales.  Abdominal:     General: Bowel sounds are normal. There is no distension.     Palpations: Abdomen is soft. There is no mass.     Tenderness: There is no abdominal tenderness. There is no guarding or rebound.  Musculoskeletal: Normal range of motion.  Lymphadenopathy:     Cervical: No cervical adenopathy.  Skin:    General: Skin is warm and dry.     Findings: No rash.  Neurological:     Mental Status: He is alert and oriented to person, place, and time.     Comments: CN  grossly intact, station and gait intact Recall 2/3 Calculation 5/5 serial 3s  Psychiatric:        Behavior: Behavior normal.        Thought Content: Thought content normal.        Judgment: Judgment normal.       Results for orders placed or performed in visit on 01/22/18  VITAMIN D 25 Hydroxy (Vit-D Deficiency, Fractures)  Result Value Ref Range   VITD 32.88 30.00 - 100.00 ng/mL  Microalbumin / creatinine urine ratio  Result Value Ref Range   Microalb,  Ur <0.7 0.0 - 1.9 mg/dL   Creatinine,U 65.1 mg/dL   Microalb Creat Ratio 1.1 0.0 - 30.0 mg/g  PSA  Result Value Ref Range   PSA 1.73 0.10 - 4.00 ng/mL  CBC with Differential/Platelet  Result Value Ref Range   WBC 7.5 4.0 - 10.5 K/uL   RBC 4.47 4.22 - 5.81 Mil/uL   Hemoglobin 13.8 13.0 - 17.0 g/dL   HCT 41.5 39.0 - 52.0 %   MCV 92.8 78.0 - 100.0 fl   MCHC 33.3 30.0 - 36.0 g/dL   RDW 14.1 11.5 - 15.5 %   Platelets 213.0 150.0 - 400.0 K/uL   Neutrophils Relative % 58.2 43.0 - 77.0 %   Lymphocytes Relative 29.4 12.0 - 46.0 %   Monocytes Relative 8.0 3.0 - 12.0 %   Eosinophils Relative 3.3 0.0 - 5.0 %   Basophils Relative 1.1 0.0 - 3.0 %   Neutro Abs 4.4 1.4 - 7.7 K/uL   Lymphs Abs 2.2 0.7 - 4.0 K/uL   Monocytes Absolute 0.6 0.1 - 1.0 K/uL   Eosinophils Absolute 0.3 0.0 - 0.7 K/uL   Basophils Absolute 0.1 0.0 - 0.1 K/uL  Comprehensive metabolic panel  Result Value Ref Range   Sodium 139 135 - 145 mEq/L   Potassium 4.1 3.5 - 5.1 mEq/L   Chloride 106 96 - 112 mEq/L   CO2 27 19 - 32 mEq/L   Glucose, Bld 95 70 - 99 mg/dL   BUN 15 6 - 23 mg/dL   Creatinine, Ser 1.51 (H) 0.40 - 1.50 mg/dL   Total Bilirubin 0.5 0.2 - 1.2 mg/dL   Alkaline Phosphatase 106 39 - 117 U/L   AST 16 0 - 37 U/L   ALT 15 0 - 53 U/L   Total Protein 6.3 6.0 - 8.3 g/dL   Albumin 4.1 3.5 - 5.2 g/dL   Calcium 9.1 8.4 - 10.5 mg/dL   GFR 48.19 (L) >60.00 mL/min  Lipid panel  Result Value Ref Range   Cholesterol 112 0 - 200 mg/dL   Triglycerides 148.0  0.0 - 149.0 mg/dL   HDL 38.00 (L) >39.00 mg/dL   VLDL 29.6 0.0 - 40.0 mg/dL   LDL Cholesterol 44 0 - 99 mg/dL   Total CHOL/HDL Ratio 3    NonHDL 74.01    Assessment & Plan:   Problem List Items Addressed This Visit    Medicare annual wellness visit, subsequent - Primary    I have personally reviewed the Medicare Annual Wellness questionnaire and have noted 1. The patient's medical and social history 2. Their use of alcohol, tobacco or illicit drugs 3. Their current medications and supplements 4. The patient's functional ability including ADL's, fall risks, home safety risks and hearing or visual impairment. Cognitive function has been assessed and addressed as indicated.  5. Diet and physical activity 6. Evidence for depression or mood disorders The patients weight, height, BMI have been recorded in the chart. I have made referrals, counseling and provided education to the patient based on review of the above and I have provided the pt with a written personalized care plan for preventive services. Provider list updated.. See scanned questionairre as needed for further documentation. Reviewed preventative protocols and updated unless pt declined.       Hyperlipidemia    Chronic, stable. Continue statin. The ASCVD Risk score Mikey Bussing DC Jr., et al., 2013) failed to calculate for the following reasons:   The patient has a prior MI or stroke diagnosis  Health maintenance examination    Preventative protocols reviewed and updated unless pt declined. Discussed healthy diet and lifestyle.       Family history of colon cancer    Declines colonoscopy. Will continue iFOB.       CKD (chronic kidney disease) stage 3, GFR 30-59 ml/min (HCC)    Chronic, stable. Reviewed with patient. Encouraged good hydration status, pt knows to avoid NSAIDs and other nephrotoxins.       CAD (coronary artery disease)    Appreciate cards care - continue aspirin, plavix and statin.      BPH with  obstruction/lower urinary tract symptoms    Regularly followed by urology.       Advanced care planning/counseling discussion    Advanced directives - doesn't have this set up. Would want wife then 2 daughters. Would not want prolonged life support if terminal condition. Has packet at home - encouraged he review with family.       Other Visit Diagnoses    Special screening for malignant neoplasms, colon       Relevant Orders   Fecal occult blood, imunochemical       No orders of the defined types were placed in this encounter.  Orders Placed This Encounter  Procedures  . Fecal occult blood, imunochemical    Standing Status:   Future    Standing Expiration Date:   01/28/2019    Follow up plan: Return in about 1 year (around 01/28/2019) for annual exam, prior fasting for blood work, medicare wellness visit.  [name], MD

## 2018-01-27 NOTE — Assessment & Plan Note (Signed)
Regularly followed by urology.  

## 2018-01-27 NOTE — Patient Instructions (Addendum)
Pass by lab to pick up stool kit.  If interested, check with pharmacy about new 2 shot shingles series (shingrix).  Schedule eye doctor appointment at your convenience.  You are doing well today Return as needed or in 1 year for next physical.  Health Maintenance After Age 75 After age 66, you are at a higher risk for certain long-term diseases and infections as well as injuries from falls. Falls are a major cause of broken bones and head injuries in people who are older than age 60. Getting regular preventive care can help to keep you healthy and well. Preventive care includes getting regular testing and making lifestyle changes as recommended by your health care provider. Talk with your health care provider about:  Which screenings and tests you should have. A screening is a test that checks for a disease when you have no symptoms.  A diet and exercise plan that is right for you. What should I know about screenings and tests to prevent falls? Screening and testing are the best ways to find a health problem early. Early diagnosis and treatment give you the best chance of managing medical conditions that are common after age 48. Certain conditions and lifestyle choices may make you more likely to have a fall. Your health care provider may recommend:  Regular vision checks. Poor vision and conditions such as cataracts can make you more likely to have a fall. If you wear glasses, make sure to get your prescription updated if your vision changes.  Medicine review. Work with your health care provider to regularly review all of the medicines you are taking, including over-the-counter medicines. Ask your health care provider about any side effects that may make you more likely to have a fall. Tell your health care provider if any medicines that you take make you feel dizzy or sleepy.  Osteoporosis screening. Osteoporosis is a condition that causes the bones to get weaker. This can make the bones weak  and cause them to break more easily.  Blood pressure screening. Blood pressure changes and medicines to control blood pressure can make you feel dizzy.  Strength and balance checks. Your health care provider may recommend certain tests to check your strength and balance while standing, walking, or changing positions.  Foot health exam. Foot pain and numbness, as well as not wearing proper footwear, can make you more likely to have a fall.  Depression screening. You may be more likely to have a fall if you have a fear of falling, feel emotionally low, or feel unable to do activities that you used to do.  Alcohol use screening. Using too much alcohol can affect your balance and may make you more likely to have a fall. What actions can I take to lower my risk of falls? General instructions  Talk with your health care provider about your risks for falling. Tell your health care provider if: ? You fall. Be sure to tell your health care provider about all falls, even ones that seem minor. ? You feel dizzy, sleepy, or off-balance.  Take over-the-counter and prescription medicines only as told by your health care provider. These include any supplements.  Eat a healthy diet and maintain a healthy weight. A healthy diet includes low-fat dairy products, low-fat (lean) meats, and fiber from whole grains, beans, and lots of fruits and vegetables. Home safety  Remove any tripping hazards, such as rugs, cords, and clutter.  Install safety equipment such as grab bars in bathrooms and safety rails  on stairs.  Keep rooms and walkways well-lit. Activity   Follow a regular exercise program to stay fit. This will help you maintain your balance. Ask your health care provider what types of exercise are appropriate for you.  If you need a cane or walker, use it as recommended by your health care provider.  Wear supportive shoes that have nonskid soles. Lifestyle  Do not drink alcohol if your health  care provider tells you not to drink.  If you drink alcohol, limit how much you have: ? 0-1 drink a day for women. ? 0-2 drinks a day for men.  Be aware of how much alcohol is in your drink. In the [place] one drink equals one typical bottle of beer (12 oz), one-half glass of wine (5 oz), or one shot of hard liquor (1 oz).  Do not use any products that contain nicotine or tobacco, such as cigarettes and e-cigarettes. If you need help quitting, ask your health care provider. Summary  Having a healthy lifestyle and getting preventive care can help to protect your health and wellness after age 75.  Screening and testing are the best way to find a health problem early and help you avoid having a fall. Early diagnosis and treatment give you the best chance for managing medical conditions that are more common for people who are older than age 27.  Falls are a major cause of broken bones and head injuries in people who are older than age 29. Take precautions to prevent a fall at home.  Work with your health care provider to learn what changes you can make to improve your health and wellness and to prevent falls. This information is not intended to replace advice given to you by your health care provider. Make sure you discuss any questions you have with your health care provider. Document Released: 11/13/2016 Document Revised: 11/13/2016 Document Reviewed: 11/13/2016 Elsevier Interactive Patient Education  2019 [employer].

## 2018-01-27 NOTE — Assessment & Plan Note (Signed)
Appreciate cards care - continue aspirin, plavix and statin.

## 2018-02-03 ENCOUNTER — Telehealth: Payer: Self-pay | Admitting: Radiology

## 2018-02-03 ENCOUNTER — Encounter: Payer: Self-pay | Admitting: Radiology

## 2018-02-03 NOTE — Telephone Encounter (Signed)
LM for patient, he needs to re collect his stool ifob test. The kit he sent in this week has not been used in 2 years( per [facility]). I will send him a new kit today in the mail.

## 2018-02-10 ENCOUNTER — Other Ambulatory Visit (INDEPENDENT_AMBULATORY_CARE_PROVIDER_SITE_OTHER): Payer: Medicare HMO

## 2018-02-10 DIAGNOSIS — Z1211 Encounter for screening for malignant neoplasm of colon: Secondary | ICD-10-CM

## 2018-02-10 LAB — FECAL OCCULT BLOOD, GUAIAC: Fecal Occult Blood: NEGATIVE

## 2018-02-10 LAB — FECAL OCCULT BLOOD, IMMUNOCHEMICAL: FECAL OCCULT BLD: NEGATIVE

## 2018-02-11 ENCOUNTER — Encounter: Payer: Self-pay | Admitting: Family Medicine

## 2018-03-04 DIAGNOSIS — N183 Chronic kidney disease, stage 3 (moderate): Secondary | ICD-10-CM | POA: Diagnosis not present

## 2018-03-04 DIAGNOSIS — E785 Hyperlipidemia, unspecified: Secondary | ICD-10-CM | POA: Diagnosis not present

## 2018-03-04 DIAGNOSIS — I214 Non-ST elevation (NSTEMI) myocardial infarction: Secondary | ICD-10-CM | POA: Diagnosis not present

## 2018-03-04 DIAGNOSIS — Z9861 Coronary angioplasty status: Secondary | ICD-10-CM | POA: Diagnosis not present

## 2018-03-04 DIAGNOSIS — I2510 Atherosclerotic heart disease of native coronary artery without angina pectoris: Secondary | ICD-10-CM | POA: Diagnosis not present

## 2018-06-30 NOTE — Progress Notes (Signed)
07/01/2018 3:38 PM   Albert Giles Sep 17, 1945 [id number]  Referring provider: [name], MD [place],  [place] [address]  Chief Complaint  Patient presents with  . history of hematuria    HPI: 73 yo WM with a history of hematuria and BPH with LU TS who presents today for a one year follow up.  History of hematuria Former 30PY smoker. He is on plavix. Eval 2017 with CTU and cysto with bilobar prostate hypertrophy only.  Eval 2019 with RUS, MRI and cysto with right renal cyst and biateral lobe enlargement prostate; prominent hypervascularity.  UA is negative.    BPH WITH LUTS His IPSS score today is 5, which is mild lower urinary tract symptomatology. He is pleased with his quality life due to his urinary symptoms.   His previous I PSS score was 5/1.  He has no urinary complaints at this time.  He denies any dysuria, hematuria or suprapubic pain.   He currently taking tamsulosin 0.4 mg daily.   He also denies any recent fevers, chills, nausea or vomiting. IPSS    Row Name 07/01/18 1500         International Prostate Symptom Score   How often have you had the sensation of not emptying your bladder?  Less than 1 in 5     How often have you had to urinate less than every two hours?  Less than half the time     How often have you found you stopped and started again several times when you urinated?  Less than 1 in 5 times     How often have you found it difficult to postpone urination?  Not at All     How often have you had a weak urinary stream?  Not at All     How often have you had to strain to start urination?  Not at All     How many times did you typically get up at night to urinate?  1 Time     Total IPSS Score  5       Quality of Life due to urinary symptoms   If you were to spend the rest of your life with your urinary condition just the way it is now how would you feel about that?  Pleased        Score:  1-7 Mild 8-19 Moderate 20-35 Severe    PMH: Past Medical History:  Diagnosis Date  . CAD (coronary artery disease) 06/2014   UA with NSTEMI - cath with 99% prox L circ s/p stent, EF 40% ([place], [place] at [facility])  . Emphysema    mild  . Ex-smoker   . NSTEMI (non-ST elevated myocardial infarction) ([place]) 07/13/2014    Surgical History: Past Surgical History:  Procedure Laterality Date  . CARDIAC CATHETERIZATION N/A 07/14/2014   Left Heart Cath and Coronary Angiography with stent placement;  Surgeon: Teodoro Spray, MD  . CARDIAC CATHETERIZATION N/A 07/14/2014   Coronary Stent Intervention;  Surgeon: Yolonda Kida, MD  . CYSTECTOMY     on back  . INGUINAL HERNIA REPAIR Right 08/17/04    Home Medications:  Allergies as of 07/01/2018   No Known Allergies     Medication List       Accurate as of July 01, 2018  3:38 PM. If you have any questions, ask your nurse or doctor.        aspirin EC 81 MG tablet Take 1  tablet (81 mg total) by mouth daily.   atorvastatin 40 MG tablet Commonly known as: LIPITOR Take 1 tablet (40 mg total) by mouth daily at 6 PM.   clopidogrel 75 MG tablet Commonly known as: PLAVIX Take 1 tablet (75 mg total) by mouth daily.   metoprolol tartrate 25 MG tablet Commonly known as: LOPRESSOR Take 1 tablet (25 mg total) by mouth 2 (two) times daily.   tamsulosin 0.4 MG Caps capsule Commonly known as: FLOMAX Take 1 capsule (0.4 mg total) by mouth daily.       Allergies: No Known Allergies  Family History: Family History  Problem Relation Age of Onset  . Cancer Mother 53       colon  . Cancer Sister        breast  . CAD Neg Hx   . Stroke Neg Hx   . Diabetes Neg Hx   . Prostate cancer Neg Hx   . Kidney cancer Neg Hx   . Bladder Cancer Neg Hx     Social History:  reports that he quit smoking about 19 years ago. His smoking use included cigarettes. He has a 35.00 pack-year smoking history. He has never used smokeless tobacco. He reports that he does not drink alcohol or use  drugs.  ROS: UROLOGY Frequent Urination?: No Hard to postpone urination?: No Burning/pain with urination?: No Get up at night to urinate?: No Leakage of urine?: No Urine stream starts and stops?: No Trouble starting stream?: No Do you have to strain to urinate?: No Blood in urine?: No Urinary tract infection?: No Sexually transmitted disease?: No Injury to kidneys or bladder?: No Painful intercourse?: No Weak stream?: No Erection problems?: No Penile pain?: No  Gastrointestinal Nausea?: No Vomiting?: No Indigestion/heartburn?: No Diarrhea?: No Constipation?: No  Constitutional Fever: No Night sweats?: No Weight loss?: No Fatigue?: No  Skin Skin rash/lesions?: No Itching?: No  Eyes Blurred vision?: No Double vision?: No  Ears/Nose/Throat Sore throat?: No Sinus problems?: No  Hematologic/Lymphatic Swollen glands?: No Easy bruising?: No  Cardiovascular Leg swelling?: No Chest pain?: No  Respiratory Cough?: No Shortness of breath?: No  Endocrine Excessive thirst?: No  Musculoskeletal Back pain?: No Joint pain?: No  Neurological Headaches?: No Dizziness?: No  Psychologic Depression?: No Anxiety?: No  Physical Exam: BP 133/86 (BP Location: Left Arm, Patient Position: Sitting)   Pulse 71   Ht 5\' 9"  (1.753 m)   Wt 179 lb (81.2 kg)   BMI 26.43 kg/m   Constitutional:  Well nourished. Alert and oriented, No acute distress. HEENT: [place] AT, moist mucus membranes.  Trachea midline, no masses. Cardiovascular: No clubbing, cyanosis, or edema. Respiratory: Normal respiratory effort, no increased work of breathing. GI: Abdomen is soft, non tender, non distended, no abdominal masses. Liver and spleen not palpable.  No hernias appreciated.  Stool sample for occult testing is not indicated.   GU: No CVA tenderness.  No bladder fullness or masses.  Patient with circumcised phallus.  Urethral meatus is patent.  No penile discharge. No penile lesions or  rashes. Scrotum without lesions, cysts, rashes and/or edema.  Testicles are located scrotally bilaterally. No masses are appreciated in the testicles. Left and right epididymis are normal. Rectal: Patient with  normal sphincter tone. Anus and perineum without scarring or rashes. No rectal masses are appreciated. Prostate is approximately 50 grams, could only palpate the apex and the midportion of the glands, no nodules are appreciated.  Skin: No rashes, bruises or suspicious lesions. Lymph: No inguinal  adenopathy. Neurologic: Grossly intact, no focal deficits, moving all 4 extremities. Psychiatric: Normal mood and affect.  Laboratory Data: Lab Results  Component Value Date   WBC 7.5 01/22/2018   HGB 13.8 01/22/2018   HCT 41.5 01/22/2018   MCV 92.8 01/22/2018   PLT 213.0 01/22/2018    Lab Results  Component Value Date   CREATININE 1.51 (H) 01/22/2018    Lab Results  Component Value Date   PSA 1.73 01/22/2018   PSA 3.25 03/20/2015    Lab Results  Component Value Date   HGBA1C 5.5 07/14/2014    Urinalysis Negative.  See Epic    I have reviewed the labs.   Assessment & Plan:    1.  History of hematuria Completed workup in 2017 and 2019 with CTU, RUS, MRI and cystoscopy 2 - right renal cysts - bilobar prostate hypertrophy with hypervascularity  UA today is negative for hematuria   2. BPH with LUTS  - IPSS score is 5/1, it is improved  - Continue conservative management, avoiding bladder irritants and timed voiding's  - Continue tamsulosin 0.4 mg daily  - RTC in 12 months for I PSS and exam   Return in about 1 year (around 07/01/2019) for I PSS, UA and exam .  Zara Council, [facility]  [place],  [id number] [phone]

## 2018-07-01 ENCOUNTER — Ambulatory Visit: Payer: Medicare HMO | Admitting: Urology

## 2018-07-01 ENCOUNTER — Other Ambulatory Visit: Payer: Self-pay

## 2018-07-01 ENCOUNTER — Encounter: Payer: Self-pay | Admitting: Urology

## 2018-07-01 VITALS — BP 133/86 | HR 71 | Ht 69.0 in | Wt 179.0 lb

## 2018-07-01 DIAGNOSIS — Z87448 Personal history of other diseases of urinary system: Secondary | ICD-10-CM

## 2018-07-01 DIAGNOSIS — N138 Other obstructive and reflux uropathy: Secondary | ICD-10-CM | POA: Diagnosis not present

## 2018-07-01 DIAGNOSIS — N401 Enlarged prostate with lower urinary tract symptoms: Secondary | ICD-10-CM | POA: Diagnosis not present

## 2018-07-02 LAB — URINALYSIS, COMPLETE
Bilirubin, UA: NEGATIVE
Glucose, UA: NEGATIVE
Ketones, UA: NEGATIVE
Leukocytes,UA: NEGATIVE
Nitrite, UA: NEGATIVE
Protein,UA: NEGATIVE
Specific Gravity, UA: 1.015 (ref 1.005–1.030)
Urobilinogen, Ur: 0.2 mg/dL (ref 0.2–1.0)
pH, UA: 5 (ref 5.0–7.5)

## 2018-07-02 LAB — MICROSCOPIC EXAMINATION
Bacteria, UA: NONE SEEN
RBC: NONE SEEN /hpf (ref 0–2)

## 2018-08-13 ENCOUNTER — Other Ambulatory Visit: Payer: Self-pay | Admitting: Urology

## 2018-08-13 DIAGNOSIS — N138 Other obstructive and reflux uropathy: Secondary | ICD-10-CM

## 2018-08-13 DIAGNOSIS — N401 Enlarged prostate with lower urinary tract symptoms: Secondary | ICD-10-CM

## 2018-09-08 DIAGNOSIS — E785 Hyperlipidemia, unspecified: Secondary | ICD-10-CM | POA: Diagnosis not present

## 2018-09-08 DIAGNOSIS — I214 Non-ST elevation (NSTEMI) myocardial infarction: Secondary | ICD-10-CM | POA: Diagnosis not present

## 2018-09-16 ENCOUNTER — Ambulatory Visit (INDEPENDENT_AMBULATORY_CARE_PROVIDER_SITE_OTHER): Payer: Medicare HMO

## 2018-09-16 DIAGNOSIS — Z23 Encounter for immunization: Secondary | ICD-10-CM | POA: Diagnosis not present

## 2018-11-14 ENCOUNTER — Other Ambulatory Visit: Payer: Self-pay | Admitting: Urology

## 2018-11-14 DIAGNOSIS — N138 Other obstructive and reflux uropathy: Secondary | ICD-10-CM

## 2018-11-14 DIAGNOSIS — N401 Enlarged prostate with lower urinary tract symptoms: Secondary | ICD-10-CM

## 2019-03-11 DIAGNOSIS — I214 Non-ST elevation (NSTEMI) myocardial infarction: Secondary | ICD-10-CM | POA: Diagnosis not present

## 2019-03-11 DIAGNOSIS — I2510 Atherosclerotic heart disease of native coronary artery without angina pectoris: Secondary | ICD-10-CM | POA: Diagnosis not present

## 2019-03-11 DIAGNOSIS — Z9861 Coronary angioplasty status: Secondary | ICD-10-CM | POA: Diagnosis not present

## 2019-03-11 DIAGNOSIS — N1832 Chronic kidney disease, stage 3b: Secondary | ICD-10-CM | POA: Diagnosis not present

## 2019-04-14 ENCOUNTER — Other Ambulatory Visit: Payer: Self-pay | Admitting: Family Medicine

## 2019-04-14 DIAGNOSIS — N183 Chronic kidney disease, stage 3 unspecified: Secondary | ICD-10-CM

## 2019-04-14 DIAGNOSIS — N138 Other obstructive and reflux uropathy: Secondary | ICD-10-CM

## 2019-04-14 DIAGNOSIS — E785 Hyperlipidemia, unspecified: Secondary | ICD-10-CM

## 2019-04-14 DIAGNOSIS — N401 Enlarged prostate with lower urinary tract symptoms: Secondary | ICD-10-CM

## 2019-04-15 ENCOUNTER — Other Ambulatory Visit: Payer: Medicare HMO

## 2019-04-15 ENCOUNTER — Ambulatory Visit: Payer: Medicare HMO

## 2019-04-15 ENCOUNTER — Other Ambulatory Visit: Payer: Self-pay

## 2019-04-15 ENCOUNTER — Ambulatory Visit (INDEPENDENT_AMBULATORY_CARE_PROVIDER_SITE_OTHER): Payer: Medicare HMO

## 2019-04-15 DIAGNOSIS — Z0000 Encounter for general adult medical examination without abnormal findings: Secondary | ICD-10-CM | POA: Diagnosis not present

## 2019-04-15 NOTE — Patient Instructions (Signed)
Albert Giles , Thank you for taking time to come for your Medicare Wellness Visit. I appreciate your ongoing commitment to your health goals. Please review the following plan we discussed and let me know if I can assist you in the future.   Screening recommendations/referrals: Colonoscopy: FOBT due  Recommended yearly ophthalmology/optometry visit for glaucoma screening and checkup Recommended yearly dental visit for hygiene and checkup  Vaccinations: Influenza vaccine: Up to date, completed 09/16/2018 Pneumococcal vaccine: Completed series Tdap vaccine: decline Shingles vaccine: discussed    Advanced directives: Advance directive discussed with you today. Even though you declined this today please call our office should you change your mind and we can give you the proper paperwork for you to fill out.  Conditions/risks identified: hyperlipidemia  Next appointment: 04/23/2019 @ 10:30 am   Preventive Care 76 Years and Older, Male Preventive care refers to lifestyle choices and visits with your health care provider that can promote health and wellness. What does preventive care include?  A yearly physical exam. This is also called an annual well check.  Dental exams once or twice a year.  Routine eye exams. Ask your health care provider how often you should have your eyes checked.  Personal lifestyle choices, including:  Daily care of your teeth and gums.  Regular physical activity.  Eating a healthy diet.  Avoiding tobacco and drug use.  Limiting alcohol use.  Practicing safe sex.  Taking low doses of aspirin every day.  Taking vitamin and mineral supplements as recommended by your health care provider. What happens during an annual well check? The services and screenings done by your health care provider during your annual well check will depend on your age, overall health, lifestyle risk factors, and family history of disease. Counseling  Your health care provider may ask  you questions about your:  Alcohol use.  Tobacco use.  Drug use.  Emotional well-being.  Home and relationship well-being.  Sexual activity.  Eating habits.  History of falls.  Memory and ability to understand (cognition).  Work and work Statistician. Screening  You may have the following tests or measurements:  Height, weight, and BMI.  Blood pressure.  Lipid and cholesterol levels. These may be checked every 5 years, or more frequently if you are over 16 years old.  Skin check.  Lung cancer screening. You may have this screening every year starting at age 21 if you have a 30-pack-year history of smoking and currently smoke or have quit within the past 15 years.  Fecal occult blood test (FOBT) of the stool. You may have this test every year starting at age 30.  Flexible sigmoidoscopy or colonoscopy. You may have a sigmoidoscopy every 5 years or a colonoscopy every 10 years starting at age 2.  Prostate cancer screening. Recommendations will vary depending on your family history and other risks.  Hepatitis C blood test.  Hepatitis B blood test.  Sexually transmitted disease (STD) testing.  Diabetes screening. This is done by checking your blood sugar (glucose) after you have not eaten for a while (fasting). You may have this done every 1-3 years.  Abdominal aortic aneurysm (AAA) screening. You may need this if you are a current or former smoker.  Osteoporosis. You may be screened starting at age 69 if you are at high risk. Talk with your health care provider about your test results, treatment options, and if necessary, the need for more tests. Vaccines  Your health care provider may recommend certain vaccines, such as:  Influenza vaccine. This is recommended every year.  Tetanus, diphtheria, and acellular pertussis (Tdap, Td) vaccine. You may need a Td booster every 10 years.  Zoster vaccine. You may need this after age 19.  Pneumococcal 13-valent conjugate  (PCV13) vaccine. One dose is recommended after age 33.  Pneumococcal polysaccharide (PPSV23) vaccine. One dose is recommended after age 84. Talk to your health care provider about which screenings and vaccines you need and how often you need them. This information is not intended to replace advice given to you by your health care provider. Make sure you discuss any questions you have with your health care provider. Document Released: 01/27/2015 Document Revised: 09/20/2015 Document Reviewed: 11/01/2014 Elsevier Interactive Patient Education  2017 [employer] Prevention in the Home Falls can cause injuries. They can happen to people of all ages. There are many things you can do to make your home safe and to help prevent falls. What can I do on the outside of my home?  Regularly fix the edges of walkways and driveways and fix any cracks.  Remove anything that might make you trip as you walk through a door, such as a raised step or threshold.  Trim any bushes or trees on the path to your home.  Use bright outdoor lighting.  Clear any walking paths of anything that might make someone trip, such as rocks or tools.  Regularly check to see if handrails are loose or broken. Make sure that both sides of any steps have handrails.  Any raised decks and porches should have guardrails on the edges.  Have any leaves, snow, or ice cleared regularly.  Use sand or salt on walking paths during winter.  Clean up any spills in your garage right away. This includes oil or grease spills. What can I do in the bathroom?  Use night lights.  Install grab bars by the toilet and in the tub and shower. Do not use towel bars as grab bars.  Use non-skid mats or decals in the tub or shower.  If you need to sit down in the shower, use a plastic, non-slip stool.  Keep the floor dry. Clean up any water that spills on the floor as soon as it happens.  Remove soap buildup in the tub or shower  regularly.  Attach bath mats securely with double-sided non-slip rug tape.  Do not have throw rugs and other things on the floor that can make you trip. What can I do in the bedroom?  Use night lights.  Make sure that you have a light by your bed that is easy to reach.  Do not use any sheets or blankets that are too big for your bed. They should not hang down onto the floor.  Have a firm chair that has side arms. You can use this for support while you get dressed.  Do not have throw rugs and other things on the floor that can make you trip. What can I do in the kitchen?  Clean up any spills right away.  Avoid walking on wet floors.  Keep items that you use a lot in easy-to-reach places.  If you need to reach something above you, use a strong step stool that has a grab bar.  Keep electrical cords out of the way.  Do not use floor polish or wax that makes floors slippery. If you must use wax, use non-skid floor wax.  Do not have throw rugs and other things on the floor that  can make you trip. What can I do with my stairs?  Do not leave any items on the stairs.  Make sure that there are handrails on both sides of the stairs and use them. Fix handrails that are broken or loose. Make sure that handrails are as long as the stairways.  Check any carpeting to make sure that it is firmly attached to the stairs. Fix any carpet that is loose or worn.  Avoid having throw rugs at the top or bottom of the stairs. If you do have throw rugs, attach them to the floor with carpet tape.  Make sure that you have a light switch at the top of the stairs and the bottom of the stairs. If you do not have them, ask someone to add them for you. What else can I do to help prevent falls?  Wear shoes that:  Do not have high heels.  Have rubber bottoms.  Are comfortable and fit you well.  Are closed at the toe. Do not wear sandals.  If you use a stepladder:  Make sure that it is fully  opened. Do not climb a closed stepladder.  Make sure that both sides of the stepladder are locked into place.  Ask someone to hold it for you, if possible.  Clearly mark and make sure that you can see:  Any grab bars or handrails.  First and last steps.  Where the edge of each step is.  Use tools that help you move around (mobility aids) if they are needed. These include:  Canes.  Walkers.  Scooters.  Crutches.  Turn on the lights when you go into a dark area. Replace any light bulbs as soon as they burn out.  Set up your furniture so you have a clear path. Avoid moving your furniture around.  If any of your floors are uneven, fix them.  If there are any pets around you, be aware of where they are.  Review your medicines with your doctor. Some medicines can make you feel dizzy. This can increase your chance of falling. Ask your doctor what other things that you can do to help prevent falls. This information is not intended to replace advice given to you by your health care provider. Make sure you discuss any questions you have with your health care provider. Document Released: 10/27/2008 Document Revised: 06/08/2015 Document Reviewed: 02/04/2014 Elsevier Interactive Patient Education  2017 [employer].

## 2019-04-15 NOTE — Progress Notes (Signed)
Subjective:   Albert Giles is a 74 y.o. male who presents for Medicare Annual/Subsequent preventive examination.  Review of Systems: N/A   This visit is being conducted through telemedicine via telephone at the nurse health advisor's home address due to the COVID-19 pandemic. This patient has given me verbal consent via doximity to conduct this visit, patient states they are participating from their home address. Patient and myself are on the telephone call. There is no referral for this visit. Some vital signs may be absent or patient reported.    Patient identification: identified by name, DOB, and current address   Cardiac Risk Factors include: advanced age (>36men, >38 women);dyslipidemia;male gender     Objective:    Vitals: There were no vitals taken for this visit.  There is no height or weight on file to calculate BMI.  Advanced Directives 04/15/2019 01/17/2017 07/13/2014  Does Patient Have a Medical Advance Directive? No No No  Would patient like information on creating a medical advance directive? No - Patient declined No - Patient declined No - patient declined information    Tobacco Social History   Tobacco Use  Smoking Status Former Smoker  . Packs/day: 1.00  . Years: 35.00  . Pack years: 35.00  . Types: Cigarettes  . Quit date: 07/07/1998  . Years since quitting: 20.7  Smokeless Tobacco Never Used     Counseling given: Not Answered   Clinical Intake:  Pre-visit preparation completed: Yes  Pain : No/denies pain     Nutritional Risks: None Diabetes: No  How often do you need to have someone help you when you read instructions, pamphlets, or other written materials from your doctor or pharmacy?: 1 - Never What is the last grade level you completed in school?: 12th  Interpreter Needed?: No  Information entered by :: CJohnson, LPN  Past Medical History:  Diagnosis Date  . CAD (coronary artery disease) 06/2014   UA with NSTEMI - cath with 99% prox L  circ s/p stent, EF 40% ([place], [place] at [facility])  . Emphysema    mild  . Ex-smoker   . NSTEMI (non-ST elevated myocardial infarction) ([place]) 07/13/2014   Past Surgical History:  Procedure Laterality Date  . CARDIAC CATHETERIZATION N/A 07/14/2014   Left Heart Cath and Coronary Angiography with stent placement;  Surgeon: Teodoro Spray, MD  . CARDIAC CATHETERIZATION N/A 07/14/2014   Coronary Stent Intervention;  Surgeon: Yolonda Kida, MD  . CYSTECTOMY     on back  . INGUINAL HERNIA REPAIR Right 08/17/04   Family History  Problem Relation Age of Onset  . Cancer Mother 81       colon  . Cancer Sister        breast  . CAD Neg Hx   . Stroke Neg Hx   . Diabetes Neg Hx   . Prostate cancer Neg Hx   . Kidney cancer Neg Hx   . Bladder Cancer Neg Hx    Social History   Socioeconomic History  . Marital status: Married    Spouse name: Not on file  . Number of children: Not on file  . Years of education: Not on file  . Highest education level: Not on file  Occupational History  . Not on file  Tobacco Use  . Smoking status: Former Smoker    Packs/day: 1.00    Years: 35.00    Pack years: 35.00    Types: Cigarettes    Quit date: 07/07/1998  Years since quitting: 20.7  . Smokeless tobacco: Never Used  Substance and Sexual Activity  . Alcohol use: No  . Drug use: No  . Sexual activity: Not on file  Other Topics Concern  . Not on file  Social History Narrative   Caffeine: 2 cups coffee, 2 cups tea/day   Lives with wife   Occupation: Higher education careers adviser, retired   Edu: HS   Activity: works in yard, walking   Diet: some water, fruits/vegetables daily   Social Determinants of Radio broadcast assistant Strain: [place]   . Difficulty of Paying Living Expenses: Not hard at all  Food Insecurity: No Food Insecurity  . Worried About Charity fundraiser in the Last Year: Never true  . Ran Out of Food in the Last Year: Never true  Transportation Needs: No Transportation Needs   . Lack of Transportation (Medical): No  . Lack of Transportation (Non-Medical): No  Physical Activity: Inactive  . Days of Exercise per Week: 0 days  . Minutes of Exercise per Session: 0 min  Stress: No Stress Concern Present  . Feeling of Stress : Not at all  Social Connections:   . Frequency of Communication with Friends and Family:   . Frequency of Social Gatherings with Friends and Family:   . Attends Religious Services:   . Active Member of Clubs or Organizations:   . Attends Archivist Meetings:   Marland Kitchen Marital Status:     Outpatient Encounter Medications as of 04/15/2019  Medication Sig  . aspirin EC 81 MG tablet Take 1 tablet (81 mg total) by mouth daily.  Marland Kitchen atorvastatin (LIPITOR) 40 MG tablet Take 1 tablet (40 mg total) by mouth daily at 6 PM.  . clopidogrel (PLAVIX) 75 MG tablet Take 1 tablet (75 mg total) by mouth daily.  . metoprolol tartrate (LOPRESSOR) 25 MG tablet Take 1 tablet (25 mg total) by mouth 2 (two) times daily.  . tamsulosin (FLOMAX) 0.4 MG CAPS capsule Take 1 capsule by mouth once daily   Facility-Administered Encounter Medications as of 04/15/2019  Medication  . ciprofloxacin (CIPRO) tablet 500 mg  . lidocaine (XYLOCAINE) 2 % jelly 1 application    Activities of Daily Living In your present state of health, do you have any difficulty performing the following activities: 04/15/2019  Hearing? N  Vision? N  Difficulty concentrating or making decisions? N  Walking or climbing stairs? N  Dressing or bathing? N  Doing errands, shopping? N  Preparing Food and eating ? N  Using the Toilet? N  In the past six months, have you accidently leaked urine? N  Do you have problems with loss of bowel control? N  Managing your Medications? N  Managing your Finances? N  Housekeeping or managing your Housekeeping? N  Some recent data might be hidden    Patient Care Team: [name], MD as PCP - General (Family Medicine)   Assessment:   This is a  routine wellness examination for Albert Giles.  Exercise Activities and Dietary recommendations Current Exercise Habits: The patient does not participate in regular exercise at present, Exercise limited by: None identified  Goals    . Increase physical activity     Starting 01/17/2017 and weather permitting, I will continue to walk 30 minutes daily.     . Patient Stated     04/15/2019, I will maintain and continue medications as prescribed.        Fall Risk Fall Risk  04/15/2019 01/27/2018 01/17/2017  01/01/2016 12/30/2014  Falls in the past year? 0 0 No No No  Number falls in past yr: 0 - - - -  Injury with Fall? 0 - - - -  Risk for fall due to : Medication side effect - - - -  Follow up Falls evaluation completed;Falls prevention discussed - - - -   Is the patient's home free of loose throw rugs in walkways, pet beds, electrical cords, etc?   yes      Grab bars in the bathroom? yes      Handrails on the stairs?   yes      Adequate lighting?   yes  Timed Get Up and Go Performed: N/A  Depression Screen PHQ 2/9 Scores 04/15/2019 01/27/2018 01/17/2017 01/01/2016  PHQ - 2 Score 0 0 0 0  PHQ- 9 Score 0 - 0 -    Cognitive Function MMSE - Mini Mental State Exam 04/15/2019 01/17/2017  Orientation to time 5 5  Orientation to Place 5 5  Registration 3 3  Attention/ Calculation 5 0  Recall 3 2  Recall-comments - unable to recall 1 of 3 words  Language- name 2 objects - 0  Language- repeat 1 1  Language- follow 3 step command - 3  Language- read & follow direction - 0  Write a sentence - 0  Copy design - 0  Total score - 19  Mini Cog  Mini-Cog screen was completed. Maximum score is 22. A value of 0 denotes this part of the MMSE was not completed or the patient failed this part of the Mini-Cog screening.       Immunization History  Administered Date(s) Administered  . Fluad Quad(high Dose 65+) 09/16/2018  . Influenza,inj,Quad PF,6+ Mos 12/30/2014, 11/28/2015, 12/03/2016, 11/06/2017  .  Pneumococcal Conjugate-13 12/27/2013  . Pneumococcal Polysaccharide-23 01/01/2016  . Td 01/15/2007    Qualifies for Shingles Vaccine: Yes  Screening Tests Health Maintenance  Topic Date Due  . COLONOSCOPY  Never done  . DTaP/Tdap/Td (2 - Tdap) 01/14/2017  . COLON CANCER SCREENING ANNUAL FOBT  02/11/2019  . TETANUS/TDAP  04/14/2020 (Originally 01/14/2017)  . DTAP VACCINES (1) 04/26/2020 (Originally 09/28/1945)  . INFLUENZA VACCINE  08/15/2019  . Hepatitis C Screening  Completed  . PNA vac Low Risk Adult  Completed   Cancer Screenings: Lung: Low Dose CT Chest recommended if Age 31-80 years, 30 pack-year currently smoking OR have quit w/in 15 years. Patient does not qualify. Colorectal: FOBT due   Additional Screenings:  Hepatitis C Screening: 12/25/2015      Plan:   Patient will maintain and continue medications as prescribed.   I have personally reviewed and noted the following in the patient's chart:   . Medical and social history . Use of alcohol, tobacco or illicit drugs  . Current medications and supplements . Functional ability and status . Nutritional status . Physical activity . Advanced directives . List of other physicians . Hospitalizations, surgeries, and ER visits in previous 12 months . Vitals . Screenings to include cognitive, depression, and falls . Referrals and appointments  In addition, I have reviewed and discussed with patient certain preventive protocols, quality metrics, and best practice recommendations. A written personalized care plan for preventive services as well as general preventive health recommendations were provided to patient.     Jeri, Seever, LPN  [phone]

## 2019-04-15 NOTE — Progress Notes (Signed)
PCP notes:  Health Maintenance: Colorectal- FOBT due Tdap- insurance/financial   Abnormal Screenings: none   Patient concerns: Discuss stopping Flomax   Nurse concerns: none   Next PCP appt.: 04/23/2019 @ 10:30 am

## 2019-04-23 ENCOUNTER — Encounter: Payer: Self-pay | Admitting: Family Medicine

## 2019-04-23 ENCOUNTER — Ambulatory Visit (INDEPENDENT_AMBULATORY_CARE_PROVIDER_SITE_OTHER): Payer: Medicare HMO | Admitting: Family Medicine

## 2019-04-23 ENCOUNTER — Other Ambulatory Visit: Payer: Self-pay

## 2019-04-23 VITALS — BP 134/72 | HR 70 | Temp 97.9°F | Ht 67.0 in | Wt 173.1 lb

## 2019-04-23 DIAGNOSIS — N401 Enlarged prostate with lower urinary tract symptoms: Secondary | ICD-10-CM | POA: Diagnosis not present

## 2019-04-23 DIAGNOSIS — Z0000 Encounter for general adult medical examination without abnormal findings: Secondary | ICD-10-CM

## 2019-04-23 DIAGNOSIS — Z1211 Encounter for screening for malignant neoplasm of colon: Secondary | ICD-10-CM

## 2019-04-23 DIAGNOSIS — I2510 Atherosclerotic heart disease of native coronary artery without angina pectoris: Secondary | ICD-10-CM

## 2019-04-23 DIAGNOSIS — N138 Other obstructive and reflux uropathy: Secondary | ICD-10-CM

## 2019-04-23 DIAGNOSIS — Z800 Family history of malignant neoplasm of digestive organs: Secondary | ICD-10-CM | POA: Diagnosis not present

## 2019-04-23 DIAGNOSIS — E785 Hyperlipidemia, unspecified: Secondary | ICD-10-CM | POA: Diagnosis not present

## 2019-04-23 DIAGNOSIS — N1831 Chronic kidney disease, stage 3a: Secondary | ICD-10-CM

## 2019-04-23 DIAGNOSIS — Z7189 Other specified counseling: Secondary | ICD-10-CM

## 2019-04-23 LAB — COMPREHENSIVE METABOLIC PANEL
ALT: 9 U/L (ref 0–53)
AST: 13 U/L (ref 0–37)
Albumin: 4.2 g/dL (ref 3.5–5.2)
Alkaline Phosphatase: 119 U/L — ABNORMAL HIGH (ref 39–117)
BUN: 16 mg/dL (ref 6–23)
CO2: 25 mEq/L (ref 19–32)
Calcium: 8.7 mg/dL (ref 8.4–10.5)
Chloride: 108 mEq/L (ref 96–112)
Creatinine, Ser: 1.62 mg/dL — ABNORMAL HIGH (ref 0.40–1.50)
GFR: 41.66 mL/min — ABNORMAL LOW (ref >60.00)
Glucose, Bld: 98 mg/dL (ref 70–99)
Potassium: 4.3 mEq/L (ref 3.5–5.1)
Sodium: 140 mEq/L (ref 135–145)
Total Bilirubin: 0.6 mg/dL (ref 0.2–1.2)
Total Protein: 6.6 g/dL (ref 6.0–8.3)

## 2019-04-23 LAB — LIPID PANEL
Cholesterol: 117 mg/dL (ref 0–200)
HDL: 38.8 mg/dL — ABNORMAL LOW (ref >39.00)
LDL Cholesterol: 61 mg/dL (ref 0–99)
NonHDL: 78.11
Total CHOL/HDL Ratio: 3
Triglycerides: 88 mg/dL (ref 0.0–149.0)
VLDL: 17.6 mg/dL (ref 0.0–40.0)

## 2019-04-23 LAB — CBC WITH DIFFERENTIAL/PLATELET
Basophils Absolute: 0.1 10*3/uL (ref 0.0–0.1)
Basophils Relative: 1 % (ref 0.0–3.0)
Eosinophils Absolute: 0.3 10*3/uL (ref 0.0–0.7)
Eosinophils Relative: 4.5 % (ref 0.0–5.0)
HCT: 32.4 % — ABNORMAL LOW (ref 39.0–52.0)
Hemoglobin: 10.5 g/dL — ABNORMAL LOW (ref 13.0–17.0)
Lymphocytes Relative: 24.2 % (ref 12.0–46.0)
Lymphs Abs: 1.8 10*3/uL (ref 0.7–4.0)
MCHC: 32.5 g/dL (ref 30.0–36.0)
MCV: 83.8 fl (ref 78.0–100.0)
Monocytes Absolute: 0.6 10*3/uL (ref 0.1–1.0)
Monocytes Relative: 8.1 % (ref 3.0–12.0)
Neutro Abs: 4.7 10*3/uL (ref 1.4–7.7)
Neutrophils Relative %: 62.2 % (ref 43.0–77.0)
Platelets: 240 10*3/uL (ref 150.0–400.0)
RBC: 3.87 Mil/uL — ABNORMAL LOW (ref 4.22–5.81)
RDW: 15.2 % (ref 11.5–15.5)
WBC: 7.5 10*3/uL (ref 4.0–10.5)

## 2019-04-23 LAB — MICROALBUMIN / CREATININE URINE RATIO
Creatinine,U: 222.5 mg/dL
Microalb Creat Ratio: 0.7 mg/g (ref 0.0–30.0)
Microalb, Ur: 1.6 mg/dL (ref 0.0–1.9)

## 2019-04-23 LAB — VITAMIN D 25 HYDROXY (VIT D DEFICIENCY, FRACTURES): VITD: 29.7 ng/mL — ABNORMAL LOW (ref 30.00–100.00)

## 2019-04-23 MED ORDER — TAMSULOSIN HCL 0.4 MG PO CAPS: 0.4000 mg | ORAL_CAPSULE | Freq: Every day | ORAL | 1 refills | Status: DC

## 2019-04-23 NOTE — Assessment & Plan Note (Signed)
Appreciate cards care [facility])

## 2019-04-23 NOTE — Patient Instructions (Addendum)
Labs today Pass by lab to pick up stool kit.  Trial slow taper off flomax - take every other day for the next few weeks and if doing well, may stop.  Work on water intake throughout the day You are doing well toda Return as needed or in 1 year for next physical.  Health Maintenance After Age 76 After age 67, you are at a higher risk for certain long-term diseases and infections as well as injuries from falls. Falls are a major cause of broken bones and head injuries in people who are older than age 75. Getting regular preventive care can help to keep you healthy and well. Preventive care includes getting regular testing and making lifestyle changes as recommended by your health care provider. Talk with your health care provider about:  Which screenings and tests you should have. A screening is a test that checks for a disease when you have no symptoms.  A diet and exercise plan that is right for you. What should I know about screenings and tests to prevent falls? Screening and testing are the best ways to find a health problem early. Early diagnosis and treatment give you the best chance of managing medical conditions that are common after age 71. Certain conditions and lifestyle choices may make you more likely to have a fall. Your health care provider may recommend:  Regular vision checks. Poor vision and conditions such as cataracts can make you more likely to have a fall. If you wear glasses, make sure to get your prescription updated if your vision changes.  Medicine review. Work with your health care provider to regularly review all of the medicines you are taking, including over-the-counter medicines. Ask your health care provider about any side effects that may make you more likely to have a fall. Tell your health care provider if any medicines that you take make you feel dizzy or sleepy.  Osteoporosis screening. Osteoporosis is a condition that causes the bones to get weaker. This can  make the bones weak and cause them to break more easily.  Blood pressure screening. Blood pressure changes and medicines to control blood pressure can make you feel dizzy.  Strength and balance checks. Your health care provider may recommend certain tests to check your strength and balance while standing, walking, or changing positions.  Foot health exam. Foot pain and numbness, as well as not wearing proper footwear, can make you more likely to have a fall.  Depression screening. You may be more likely to have a fall if you have a fear of falling, feel emotionally low, or feel unable to do activities that you used to do.  Alcohol use screening. Using too much alcohol can affect your balance and may make you more likely to have a fall. What actions can I take to lower my risk of falls? General instructions  Talk with your health care provider about your risks for falling. Tell your health care provider if: ? You fall. Be sure to tell your health care provider about all falls, even ones that seem minor. ? You feel dizzy, sleepy, or off-balance.  Take over-the-counter and prescription medicines only as told by your health care provider. These include any supplements.  Eat a healthy diet and maintain a healthy weight. A healthy diet includes low-fat dairy products, low-fat (lean) meats, and fiber from whole grains, beans, and lots of fruits and vegetables. Home safety  Remove any tripping hazards, such as rugs, cords, and clutter.  Install safety  equipment such as grab bars in bathrooms and safety rails on stairs.  Keep rooms and walkways well-lit. Activity   Follow a regular exercise program to stay fit. This will help you maintain your balance. Ask your health care provider what types of exercise are appropriate for you.  If you need a cane or walker, use it as recommended by your health care provider.  Wear supportive shoes that have nonskid soles. Lifestyle  Do not drink  alcohol if your health care provider tells you not to drink.  If you drink alcohol, limit how much you have: ? 0-1 drink a day for women. ? 0-2 drinks a day for men.  Be aware of how much alcohol is in your drink. In the [place] one drink equals one typical bottle of beer (12 oz), one-half glass of wine (5 oz), or one shot of hard liquor (1 oz).  Do not use any products that contain nicotine or tobacco, such as cigarettes and e-cigarettes. If you need help quitting, ask your health care provider. Summary  Having a healthy lifestyle and getting preventive care can help to protect your health and wellness after age 76.  Screening and testing are the best way to find a health problem early and help you avoid having a fall. Early diagnosis and treatment give you the best chance for managing medical conditions that are more common for people who are older than age 23.  Falls are a major cause of broken bones and head injuries in people who are older than age 40. Take precautions to prevent a fall at home.  Work with your health care provider to learn what changes you can make to improve your health and wellness and to prevent falls. This information is not intended to replace advice given to you by your health care provider. Make sure you discuss any questions you have with your health care provider. Document Revised: 04/23/2018 Document Reviewed: 11/13/2016 Elsevier Patient Education  2020 [employer].

## 2019-04-23 NOTE — Progress Notes (Signed)
This visit was conducted in person.  BP 134/72 (BP Location: Left Arm, Patient Position: Sitting, Cuff Size: Large)   Pulse 70   Temp 97.9 F (36.6 C) (Temporal)   Ht 5\' 7"  (1.702 m)   Wt 173 lb 2 oz (78.5 kg)   SpO2 97%   BMI 27.12 kg/m    CC: CPE Subjective:    Patient ID: Albert Giles, male    DOB: 29-Aug-1945, 74 y.o.   MRN: [id number]  HPI: Albert Giles is a 74 y.o. male presenting on 04/23/2019 for Annual Exam (Prt 2. )   Saw health advisor last week for medicare wellness visit. Note reviewed. May be interested in stopping flomax.  No exam data present    Clinical Support from 04/15/2019 in [place] at [facility] Total Score  0      Fall Risk  04/15/2019 01/27/2018 01/17/2017 01/01/2016 12/30/2014  Falls in the past year? 0 0 No No No  Number falls in past yr: 0 - - - -  Injury with Fall? 0 - - - -  Risk for fall due to : Medication side effect - - - -  Follow up Falls evaluation completed;Falls prevention discussed - - - -     Meds prescribed by Dr Ubaldo Glassing whom he sees regularly.   Preventative: Colon cancer screening -hasfmhxbut declines colonoscopy.iFOB stable. Prostate cancer screening -known BPH - last check 03/2015 stable. rec against continued screening by urology. Nocturia x1-2. Interested in trial taper off.  Lung cancer screening - not eligible. CXR stable 2016 Flushot yearly Prevnar 2015, pneumovax2017 Td 2009  COVID vaccine - wants J&J vaccine.  Shingrix - discussed, he's had shingles.  Advanced directives - doesn't have this set up. Would want wife then 2 daughters. Would not want prolonged life support if terminal condition. Has packet at home - encouraged he review with family.  Seat belt use discussed Sunscreen use discussed. No changing moles on skin.  Ex smoker35 PY hx quit 2000. Alcohol - none  Dentist - q6 mo Eye exam - due  Bowel - no constipation Bladder - no incontinence  Caffeine: 2 cups coffee, 2 cups  tea/day Lives with wife Occupation: Higher education careers adviser, retired Edu: HS Activity: works in yard, [employer] mi/day Diet: some water, fruits/vegetables daily      Relevant past medical, surgical, family and social history reviewed and updated as indicated. Interim medical history since our last visit reviewed. Allergies and medications reviewed and updated. Outpatient Medications Prior to Visit  Medication Sig Dispense Refill  . aspirin EC 81 MG tablet Take 1 tablet (81 mg total) by mouth daily. 60 tablet 1  . atorvastatin (LIPITOR) 40 MG tablet Take 1 tablet (40 mg total) by mouth daily at 6 PM. 60 tablet 1  . clopidogrel (PLAVIX) 75 MG tablet Take 1 tablet (75 mg total) by mouth daily. 60 tablet 1  . metoprolol tartrate (LOPRESSOR) 25 MG tablet Take 1 tablet (25 mg total) by mouth 2 (two) times daily. 60 tablet 1  . tamsulosin (FLOMAX) 0.4 MG CAPS capsule Take 1 capsule by mouth once daily 90 capsule 2  . ciprofloxacin (CIPRO) tablet 500 mg     . lidocaine (XYLOCAINE) 2 % jelly 1 application      No facility-administered medications prior to visit.     Per HPI unless specifically indicated in ROS section below Review of Systems  Constitutional: Negative for activity change, appetite change, chills, fatigue, fever and unexpected weight change.  HENT: Negative for hearing loss.   Eyes: Negative for visual disturbance.  Respiratory: Negative for cough, chest tightness, shortness of breath and wheezing.   Cardiovascular: Negative for chest pain, palpitations and leg swelling.  Gastrointestinal: Negative for abdominal distention, abdominal pain, blood in stool, constipation, diarrhea, nausea and vomiting.  Genitourinary: Negative for difficulty urinating and hematuria.  Musculoskeletal: Negative for arthralgias, myalgias and neck pain.  Skin: Negative for rash.  Neurological: Negative for dizziness, seizures, syncope and headaches.  Hematological: Negative for adenopathy. Does not  bruise/bleed easily.  Psychiatric/Behavioral: Negative for dysphoric mood. The patient is not nervous/anxious.    Objective:    BP 134/72 (BP Location: Left Arm, Patient Position: Sitting, Cuff Size: Large)   Pulse 70   Temp 97.9 F (36.6 C) (Temporal)   Ht 5\' 7"  (1.702 m)   Wt 173 lb 2 oz (78.5 kg)   SpO2 97%   BMI 27.12 kg/m   Wt Readings from Last 3 Encounters:  04/23/19 173 lb 2 oz (78.5 kg)  07/01/18 179 lb (81.2 kg)  01/27/18 174 lb (78.9 kg)    Physical Exam Vitals and nursing note reviewed.  Constitutional:      General: He is not in acute distress.    Appearance: Normal appearance. He is well-developed. He is not ill-appearing.  HENT:     Head: Normocephalic and atraumatic.     Right Ear: Hearing, tympanic membrane, ear canal and external ear normal.     Left Ear: Hearing, tympanic membrane, ear canal and external ear normal.     Mouth/Throat:     Pharynx: Uvula midline.  Eyes:     General: No scleral icterus.    Extraocular Movements: Extraocular movements intact.     Conjunctiva/sclera: Conjunctivae normal.     Pupils: Pupils are equal, round, and reactive to light.  Cardiovascular:     Rate and Rhythm: Normal rate and regular rhythm.     Pulses: Normal pulses.          Radial pulses are 2+ on the right side and 2+ on the left side.     Heart sounds: Normal heart sounds. No murmur.  Pulmonary:     Effort: Pulmonary effort is normal. No respiratory distress.     Breath sounds: Normal breath sounds. No wheezing, rhonchi or rales.  Abdominal:     General: Abdomen is flat. Bowel sounds are normal. There is no distension.     Palpations: Abdomen is soft. There is no mass.     Tenderness: There is no abdominal tenderness. There is no guarding or rebound.     Hernia: No hernia is present.  Musculoskeletal:        General: Normal range of motion.     Cervical back: Normal range of motion and neck supple.     Right lower leg: No edema.     Left lower leg: No  edema.  Lymphadenopathy:     Cervical: No cervical adenopathy.  Skin:    General: Skin is warm and dry.     Findings: No rash.  Neurological:     General: No focal deficit present.     Mental Status: He is alert and oriented to person, place, and time.     Comments: CN grossly intact, station and gait intact  Psychiatric:        Mood and Affect: Mood normal.        Behavior: Behavior normal.        Thought Content: Thought content  normal.        Judgment: Judgment normal.       Results for orders placed or performed in visit on 07/01/18  Microscopic Examination   URINE  Result Value Ref Range   WBC, UA 0-5 0 - 5 /hpf   RBC None seen 0 - 2 /hpf   Epithelial Cells (non renal) 0-10 0 - 10 /hpf   Bacteria, UA None seen None seen/Few  Urinalysis, Complete  Result Value Ref Range   Specific Gravity, UA 1.015 1.005 - 1.030   pH, UA 5.0 5.0 - 7.5   Color, UA Yellow Yellow   Appearance Ur Clear Clear   Leukocytes,UA Negative Negative   Protein,UA Negative Negative/Trace   Glucose, UA Negative Negative   Ketones, UA Negative Negative   RBC, UA 2+ (A) Negative   Bilirubin, UA Negative Negative   Urobilinogen, Ur 0.2 0.2 - 1.0 mg/dL   Nitrite, UA Negative Negative   Microscopic Examination See below:    Assessment & Plan:  This visit occurred during the SARS-CoV-2 public health emergency.  Safety protocols were in place, including screening questions prior to the visit, additional usage of staff PPE, and extensive cleaning of exam room while observing appropriate contact time as indicated for disinfecting solutions.   Problem List Items Addressed This Visit    Hyperlipidemia    Chronic, stable on statin. Update FLP.  The ASCVD Risk score Mikey Bussing DC Jr., et al., 2013) failed to calculate for the following reasons:   The patient has a prior MI or stroke diagnosis       Relevant Orders   Lipid panel   Health maintenance examination - Primary    Preventative protocols reviewed  and updated unless pt declined. Discussed healthy diet and lifestyle.       Family history of colon cancer    Continue iFOB.       CKD (chronic kidney disease) stage 3, GFR 30-59 ml/min    Update labs.       Relevant Orders   Comprehensive metabolic panel   CBC with Differential/Platelet   VITAMIN D 25 Hydroxy (Vit-D Deficiency, Fractures)   Microalbumin / creatinine urine ratio   CAD (coronary artery disease)    Appreciate cards care ([facility])      BPH with obstruction/lower urinary tract symptoms    Stable period. Released from [place] care.  rec against continued PSA screening.  Desires to trial off flomax - will decrease to QOD dosing and if doing well, trial off.       Relevant Medications   tamsulosin (FLOMAX) 0.4 MG CAPS capsule   Advanced care planning/counseling discussion    Advanced directives - doesn't have this set up. Would want wife then 2 daughters. Would not want prolonged life support if terminal condition. Has packet at home - encouraged he review with family.        Other Visit Diagnoses    Special screening for malignant neoplasms, colon       Relevant Orders   Fecal occult blood, imunochemical       Meds ordered this encounter  Medications  . tamsulosin (FLOMAX) 0.4 MG CAPS capsule    Sig: Take 1 capsule (0.4 mg total) by mouth daily.    Dispense:  90 capsule    Refill:  1   Orders Placed This Encounter  Procedures  . Fecal occult blood, imunochemical    Standing Status:   Future    Standing Expiration Date:   04/22/2020  .  Comprehensive metabolic panel  . CBC with Differential/Platelet  . VITAMIN D 25 Hydroxy (Vit-D Deficiency, Fractures)  . Microalbumin / creatinine urine ratio  . Lipid panel    Follow up plan: Return in about 1 year (around 04/22/2020) for medicare wellness visit, annual exam, prior fasting for blood work.  [name], MD

## 2019-04-23 NOTE — Assessment & Plan Note (Signed)
Stable period. Released from [place] care.  rec against continued PSA screening.  Desires to trial off flomax - will decrease to QOD dosing and if doing well, trial off.

## 2019-04-23 NOTE — Assessment & Plan Note (Signed)
Preventative protocols reviewed and updated unless pt declined. Discussed healthy diet and lifestyle.  

## 2019-04-23 NOTE — Assessment & Plan Note (Signed)
Advanced directives - doesn't have this set up. Would want wife then 2 daughters. Would not want prolonged life support if terminal condition. Has packet at home - encouraged he review with family.

## 2019-04-23 NOTE — Assessment & Plan Note (Signed)
Chronic, stable on statin. Update FLP.  The ASCVD Risk score (Goff DC Jr., et al., 2013) failed to calculate for the following reasons:   The patient has a prior MI or stroke diagnosis  

## 2019-04-23 NOTE — Assessment & Plan Note (Signed)
Continue iFOB.

## 2019-04-23 NOTE — Assessment & Plan Note (Signed)
Update labs.  

## 2019-04-27 ENCOUNTER — Telehealth: Payer: Self-pay

## 2019-04-27 ENCOUNTER — Other Ambulatory Visit (INDEPENDENT_AMBULATORY_CARE_PROVIDER_SITE_OTHER): Payer: Medicare HMO

## 2019-04-27 ENCOUNTER — Other Ambulatory Visit: Payer: Self-pay | Admitting: Family Medicine

## 2019-04-27 DIAGNOSIS — N1832 Chronic kidney disease, stage 3b: Secondary | ICD-10-CM

## 2019-04-27 DIAGNOSIS — D649 Anemia, unspecified: Secondary | ICD-10-CM

## 2019-04-27 DIAGNOSIS — Z1211 Encounter for screening for malignant neoplasm of colon: Secondary | ICD-10-CM | POA: Diagnosis not present

## 2019-04-27 DIAGNOSIS — R195 Other fecal abnormalities: Secondary | ICD-10-CM

## 2019-04-27 LAB — FECAL OCCULT BLOOD, IMMUNOCHEMICAL: Fecal Occult Bld: POSITIVE — AB

## 2019-04-27 NOTE — Telephone Encounter (Signed)
[facility] called critical results @ 1511  Positive IFOB

## 2019-04-27 NOTE — Telephone Encounter (Signed)
Noted.  See result note.  

## 2019-04-28 ENCOUNTER — Encounter: Payer: Self-pay | Admitting: *Deleted

## 2019-04-28 DIAGNOSIS — E785 Hyperlipidemia, unspecified: Secondary | ICD-10-CM | POA: Diagnosis not present

## 2019-04-28 DIAGNOSIS — Z800 Family history of malignant neoplasm of digestive organs: Secondary | ICD-10-CM | POA: Diagnosis not present

## 2019-04-28 DIAGNOSIS — Z7902 Long term (current) use of antithrombotics/antiplatelets: Secondary | ICD-10-CM | POA: Diagnosis not present

## 2019-04-28 DIAGNOSIS — Z008 Encounter for other general examination: Secondary | ICD-10-CM | POA: Diagnosis not present

## 2019-04-28 DIAGNOSIS — I252 Old myocardial infarction: Secondary | ICD-10-CM | POA: Diagnosis not present

## 2019-04-28 DIAGNOSIS — Z7722 Contact with and (suspected) exposure to environmental tobacco smoke (acute) (chronic): Secondary | ICD-10-CM | POA: Diagnosis not present

## 2019-04-28 DIAGNOSIS — I2510 Atherosclerotic heart disease of native coronary artery without angina pectoris: Secondary | ICD-10-CM | POA: Diagnosis not present

## 2019-04-28 DIAGNOSIS — Z7982 Long term (current) use of aspirin: Secondary | ICD-10-CM | POA: Diagnosis not present

## 2019-04-28 DIAGNOSIS — I951 Orthostatic hypotension: Secondary | ICD-10-CM | POA: Diagnosis not present

## 2019-04-28 DIAGNOSIS — I10 Essential (primary) hypertension: Secondary | ICD-10-CM | POA: Diagnosis not present

## 2019-04-28 DIAGNOSIS — N400 Enlarged prostate without lower urinary tract symptoms: Secondary | ICD-10-CM | POA: Diagnosis not present

## 2019-05-24 DIAGNOSIS — R195 Other fecal abnormalities: Secondary | ICD-10-CM | POA: Insufficient documentation

## 2019-05-31 ENCOUNTER — Other Ambulatory Visit: Payer: Self-pay

## 2019-05-31 ENCOUNTER — Other Ambulatory Visit (INDEPENDENT_AMBULATORY_CARE_PROVIDER_SITE_OTHER): Payer: Medicare HMO

## 2019-05-31 DIAGNOSIS — E538 Deficiency of other specified B group vitamins: Secondary | ICD-10-CM

## 2019-05-31 DIAGNOSIS — N138 Other obstructive and reflux uropathy: Secondary | ICD-10-CM

## 2019-05-31 DIAGNOSIS — D649 Anemia, unspecified: Secondary | ICD-10-CM

## 2019-05-31 DIAGNOSIS — N401 Enlarged prostate with lower urinary tract symptoms: Secondary | ICD-10-CM

## 2019-05-31 DIAGNOSIS — N1832 Chronic kidney disease, stage 3b: Secondary | ICD-10-CM

## 2019-05-31 LAB — CBC WITH DIFFERENTIAL/PLATELET
Basophils Absolute: 0.1 10*3/uL (ref 0.0–0.1)
Basophils Relative: 1.3 % (ref 0.0–3.0)
Eosinophils Absolute: 0.8 10*3/uL — ABNORMAL HIGH (ref 0.0–0.7)
Eosinophils Relative: 9.4 % — ABNORMAL HIGH (ref 0.0–5.0)
HCT: 32.8 % — ABNORMAL LOW (ref 39.0–52.0)
Hemoglobin: 10.3 g/dL — ABNORMAL LOW (ref 13.0–17.0)
Lymphocytes Relative: 27.2 % (ref 12.0–46.0)
Lymphs Abs: 2.4 10*3/uL (ref 0.7–4.0)
MCHC: 31.6 g/dL (ref 30.0–36.0)
MCV: 80.2 fl (ref 78.0–100.0)
Monocytes Absolute: 0.6 10*3/uL (ref 0.1–1.0)
Monocytes Relative: 7.3 % (ref 3.0–12.0)
Neutro Abs: 4.8 10*3/uL (ref 1.4–7.7)
Neutrophils Relative %: 54.8 % (ref 43.0–77.0)
Platelets: 292 10*3/uL (ref 150.0–400.0)
RBC: 4.09 Mil/uL — ABNORMAL LOW (ref 4.22–5.81)
RDW: 16.1 % — ABNORMAL HIGH (ref 11.5–15.5)
WBC: 8.7 10*3/uL (ref 4.0–10.5)

## 2019-05-31 LAB — COMPREHENSIVE METABOLIC PANEL
ALT: 13 U/L (ref 0–53)
AST: 16 U/L (ref 0–37)
Albumin: 4 g/dL (ref 3.5–5.2)
Alkaline Phosphatase: 124 U/L — ABNORMAL HIGH (ref 39–117)
BUN: 15 mg/dL (ref 6–23)
CO2: 27 mEq/L (ref 19–32)
Calcium: 8.7 mg/dL (ref 8.4–10.5)
Chloride: 106 mEq/L (ref 96–112)
Creatinine, Ser: 1.59 mg/dL — ABNORMAL HIGH (ref 0.40–1.50)
GFR: 42.56 mL/min — ABNORMAL LOW (ref >60.00)
Glucose, Bld: 104 mg/dL — ABNORMAL HIGH (ref 70–99)
Potassium: 4.2 mEq/L (ref 3.5–5.1)
Sodium: 138 mEq/L (ref 135–145)
Total Bilirubin: 0.5 mg/dL (ref 0.2–1.2)
Total Protein: 6.4 g/dL (ref 6.0–8.3)

## 2019-05-31 LAB — IBC PANEL
Iron: 18 ug/dL — ABNORMAL LOW (ref 42–165)
Saturation Ratios: 3.6 % — ABNORMAL LOW (ref 20.0–50.0)
Transferrin: 357 mg/dL (ref 212.0–360.0)

## 2019-06-01 LAB — VITAMIN B12: Vitamin B-12: 120 pg/mL — ABNORMAL LOW (ref 211–911)

## 2019-06-01 LAB — FERRITIN: Ferritin: 8.1 ng/mL — ABNORMAL LOW (ref 22.0–322.0)

## 2019-06-01 LAB — PSA: PSA: 1.93 ng/mL (ref 0.10–4.00)

## 2019-06-01 LAB — PARATHYROID HORMONE, INTACT (NO CA): PTH: 66 pg/mL — ABNORMAL HIGH (ref 14–64)

## 2019-06-01 LAB — FOLATE: Folate: 10.1 ng/mL (ref >5.9)

## 2019-06-05 ENCOUNTER — Other Ambulatory Visit: Payer: Self-pay | Admitting: Family Medicine

## 2019-06-05 ENCOUNTER — Encounter: Payer: Self-pay | Admitting: Family Medicine

## 2019-06-05 DIAGNOSIS — E538 Deficiency of other specified B group vitamins: Secondary | ICD-10-CM | POA: Insufficient documentation

## 2019-06-05 DIAGNOSIS — D509 Iron deficiency anemia, unspecified: Secondary | ICD-10-CM | POA: Insufficient documentation

## 2019-06-05 MED ORDER — FERROUS SULFATE 324 (65 FE) MG PO TBEC: 1.0000 | DELAYED_RELEASE_TABLET | Freq: Every day | ORAL | Status: DC

## 2019-06-15 ENCOUNTER — Ambulatory Visit (INDEPENDENT_AMBULATORY_CARE_PROVIDER_SITE_OTHER): Payer: Medicare HMO | Admitting: *Deleted

## 2019-06-15 ENCOUNTER — Other Ambulatory Visit: Payer: Self-pay

## 2019-06-15 DIAGNOSIS — E538 Deficiency of other specified B group vitamins: Secondary | ICD-10-CM

## 2019-06-15 DIAGNOSIS — D649 Anemia, unspecified: Secondary | ICD-10-CM

## 2019-06-15 MED ORDER — CYANOCOBALAMIN 1000 MCG/ML IJ SOLN
1000.0000 ug | Freq: Once | INTRAMUSCULAR | Status: AC
  Administered 2019-06-15: 1000 ug via INTRAMUSCULAR

## 2019-06-15 NOTE — Progress Notes (Signed)
Per orders of Dr. Danise Mina, injection of B12 given by Virl Cagey. Patient tolerated injection well.

## 2019-06-22 ENCOUNTER — Other Ambulatory Visit: Payer: Self-pay

## 2019-06-22 ENCOUNTER — Ambulatory Visit: Payer: Medicare HMO | Admitting: Gastroenterology

## 2019-06-22 VITALS — BP 122/73 | HR 72 | Temp 97.8°F | Ht 67.0 in | Wt 175.8 lb

## 2019-06-22 DIAGNOSIS — D508 Other iron deficiency anemias: Secondary | ICD-10-CM

## 2019-06-22 MED ORDER — FERROUS SULFATE 324 (65 FE) MG PO TBEC: 1.0000 | DELAYED_RELEASE_TABLET | ORAL | 0 refills | Status: DC

## 2019-06-22 NOTE — Progress Notes (Signed)
Albert Bellows MD, MRCP(U.K) [address]  [place]  [place], [place] [address]  Main: [phone]  Fax: [phone]   Gastroenterology Consultation  Referring Provider:     [name], MD Primary Care Physician:  [name], MD Primary Gastroenterologist:  Dr. Jonathon Giles  Reason for Consultation:     Anemia        HPI:   Albert Giles is a 74 y.o. y/o male referred for consultation & management  by Dr. [name], MD.    He has been referred for anemia.  1 year back hemoglobin is 13.8 g but when checked in April 2021 was 10.5 g with an MCV of 83.8.  Fecal occult blood test was positive which has previously been negative.  In May 2021 folate was normal but vitamin B12 levels were low at 120.  Iron studies showed a ferritin of 8.1.  Repeat hemoglobin in May demonstrated hemoglobin 10.3 g.  Denies any nosebleeds, rectal bleeding, weight loss, hematemesis, blood in urine.  He has been on Plavix.  Never had a colonoscopy.  Mother had colon cancer.  No other complaints.   Past Medical History:  Diagnosis Date  . CAD (coronary artery disease) 06/2014   UA with NSTEMI - cath with 99% prox L circ s/p stent, EF 40% ([place], [place] at [facility])  . Emphysema    mild  . Ex-smoker   . NSTEMI (non-ST elevated myocardial infarction) ([place]) 07/13/2014    Past Surgical History:  Procedure Laterality Date  . CARDIAC CATHETERIZATION N/A 07/14/2014   Left Heart Cath and Coronary Angiography with stent placement;  Surgeon: Teodoro Spray, MD  . CARDIAC CATHETERIZATION N/A 07/14/2014   Coronary Stent Intervention;  Surgeon: Yolonda Kida, MD  . CYSTECTOMY     on back  . INGUINAL HERNIA REPAIR Right 08/17/04    Prior to Admission medications   Medication Sig Start Date End Date Taking? Authorizing Provider  aspirin EC 81 MG tablet Take 1 tablet (81 mg total) by mouth daily. 07/15/14   Henreitta Leber, MD  atorvastatin (LIPITOR) 40 MG tablet Take 1 tablet (40 mg total) by  mouth daily at 6 PM. 07/15/14   Sainani, Belia Heman, MD  clopidogrel (PLAVIX) 75 MG tablet Take 1 tablet (75 mg total) by mouth daily. 07/15/14   Henreitta Leber, MD  cyanocobalamin (,VITAMIN B-12,) 1000 MCG/ML injection Inject 1 mL (1,000 mcg total) into the muscle every 30 (thirty) days. 06/05/19   [name], MD  ferrous sulfate 324 (65 Fe) MG TBEC Take 1 tablet (325 mg total) by mouth daily. 06/05/19   [name], MD  metoprolol tartrate (LOPRESSOR) 25 MG tablet Take 1 tablet (25 mg total) by mouth 2 (two) times daily. 07/15/14   Henreitta Leber, MD  tamsulosin (FLOMAX) 0.4 MG CAPS capsule Take 1 capsule (0.4 mg total) by mouth daily. 04/23/19   [name], MD    Family History  Problem Relation Age of Onset  . Cancer Mother 53       colon  . Cancer Sister        breast  . Cancer Daughter        anal  . CAD Neg Hx   . Stroke Neg Hx   . Diabetes Neg Hx   . Prostate cancer Neg Hx   . Kidney cancer Neg Hx   . Bladder Cancer Neg Hx      Social History   Tobacco Use  . Smoking  status: Former Smoker    Packs/day: 1.00    Years: 35.00    Pack years: 35.00    Types: Cigarettes    Quit date: 07/07/1998    Years since quitting: 20.9  . Smokeless tobacco: Never Used  Substance Use Topics  . Alcohol use: No  . Drug use: No    Allergies as of 06/22/2019  . (No Known Allergies)    Review of Systems:    All systems reviewed and negative except where noted in HPI.   Physical Exam:  There were no vitals taken for this visit. No LMP for male patient. Psych:  Alert and cooperative. Normal mood and affect. General:   Alert,  Well-developed, well-nourished, pleasant and cooperative in NAD Head:  Normocephalic and atraumatic. Eyes:  Sclera clear, no icterus.   Conjunctiva pink. Lungs:  Respirations even and unlabored.  Clear throughout to auscultation.   No wheezes, crackles, or rhonchi. No acute distress. Heart:  Regular rate and rhythm; no murmurs, clicks, rubs, or  gallops. Abdomen:  Normal bowel sounds.  No bruits.  Soft, non-tender and non-distended without masses, hepatosplenomegaly or hernias noted.  No guarding or rebound tenderness.    Neurologic:  Alert and oriented x3;  grossly normal neurologically. Psych:  Alert and cooperative. Normal mood and affect.  Imaging Studies: No results found.  Assessment and Plan:   Albert Giles is a 74 y.o. y/o male has been referred for anemia.  Lab work demonstrates iron deficiency as well as B12 deficiency anemia.  Plan 1.  Celiac serology urine analysis, H. pylori breath test 2.  EGD and colonoscopy and if negative will need capsule study of the small bowel.  Will need Plavix holding instructions. 3.  Continue B12 shots.  Start on iron tablets.  He did not recall having a prescription and will be provided 1 today  I have discussed alternative options, risks & benefits,  which include, but are not limited to, bleeding, infection, perforation,respiratory complication & drug reaction.  The patient agrees with this plan & written consent will be obtained.     Follow up in 6 to 8 weeks  Dr Albert Bellows MD,MRCP(U.K)

## 2019-07-01 ENCOUNTER — Ambulatory Visit: Payer: Medicare HMO | Admitting: Urology

## 2019-07-01 ENCOUNTER — Encounter: Payer: Self-pay | Admitting: Urology

## 2019-07-01 ENCOUNTER — Other Ambulatory Visit: Payer: Self-pay

## 2019-07-01 VITALS — BP 128/89 | HR 83 | Ht 67.0 in | Wt 173.0 lb

## 2019-07-01 DIAGNOSIS — N138 Other obstructive and reflux uropathy: Secondary | ICD-10-CM | POA: Diagnosis not present

## 2019-07-01 DIAGNOSIS — N529 Male erectile dysfunction, unspecified: Secondary | ICD-10-CM | POA: Diagnosis not present

## 2019-07-01 DIAGNOSIS — N401 Enlarged prostate with lower urinary tract symptoms: Secondary | ICD-10-CM | POA: Diagnosis not present

## 2019-07-01 DIAGNOSIS — R3129 Other microscopic hematuria: Secondary | ICD-10-CM | POA: Diagnosis not present

## 2019-07-01 MED ORDER — TADALAFIL 20 MG PO TABS: 20.0000 mg | ORAL_TABLET | Freq: Every day | ORAL | 3 refills | Status: DC | PRN

## 2019-07-01 NOTE — Progress Notes (Signed)
07/01/2019 2:28 PM   Albert Giles Nov 18, 1945 [id number]  Referring provider: [name], MD [address] [place],  [place] [address]  Chief Complaint  Patient presents with  . Hematuria    HPI: 74 yo male with a history of hematuria and BPH with LU TS who presents today for a one year follow up.  History of hematuria Former 30PY smoker. He is on plavix. Eval 2017 with CTU and cysto with bilobar prostate hypertrophy only.  Eval 2019 with RUS, MRI and cysto with right renal cyst and biateral lobe enlargement prostate; prominent hypervascularity.  He denies any gross hematuria.   UA today is negative for micro heme.  BPH WITH LUTS His IPSS score today is 2, which is mild lower urinary tract symptomatology.  He is mostly satisfied with his quality life due to his urinary symptoms.   His previous I PSS score was 5/1.  He has no urinary complaints at this time.  He denies any dysuria, hematuria or suprapubic pain.   He currently taking tamsulosin 0.4 mg daily.   He also denies any recent fevers, chills, nausea or vomiting.   IPSS    Row Name 07/01/19 1400         International Prostate Symptom Score   How often have you had the sensation of not emptying your bladder? Not at All     How often have you had to urinate less than every two hours? Less than 1 in 5 times     How often have you found you stopped and started again several times when you urinated? Not at All     How often have you found it difficult to postpone urination? Not at All     How often have you had a weak urinary stream? Not at All     How often have you had to strain to start urination? Not at All     How many times did you typically get up at night to urinate? 1 Time     Total IPSS Score 2       Quality of Life due to urinary symptoms   If you were to spend the rest of your life with your urinary condition just the way it is now how would you feel about that? Mostly Satisfied             Score:  1-7 Mild 8-19 Moderate 20-35 Severe  Erectile dysfunction Main complaint: Erections at 85% Risk factors:  age, BPH, HTN and CAD No  painful erections or curvatures with his erections.    Still having spontaneous erections. Has not tried anything for his erections.  He does not take nitrate products.   PMH: Past Medical History:  Diagnosis Date  . CAD (coronary artery disease) 06/2014   UA with NSTEMI - cath with 99% prox L circ s/p stent, EF 40% ([facility], [place] at [facility])  . Emphysema    mild  . Ex-smoker   . NSTEMI (non-ST elevated myocardial infarction) ([place]) 07/13/2014    Surgical History: Past Surgical History:  Procedure Laterality Date  . CARDIAC CATHETERIZATION N/A 07/14/2014   Left Heart Cath and Coronary Angiography with stent placement;  Surgeon: Teodoro Spray, MD  . CARDIAC CATHETERIZATION N/A 07/14/2014   Coronary Stent Intervention;  Surgeon: Yolonda Kida, MD  . CYSTECTOMY     on back  . INGUINAL HERNIA REPAIR Right 08/17/04    Home Medications:  Allergies as of 07/01/2019  No Known Allergies     Medication List       Accurate as of July 01, 2019  2:28 PM. If you have any questions, ask your nurse or doctor.        aspirin EC 81 MG tablet Take 1 tablet (81 mg total) by mouth daily.   atorvastatin 40 MG tablet Commonly known as: LIPITOR Take 1 tablet (40 mg total) by mouth daily at 6 PM.   clopidogrel 75 MG tablet Commonly known as: PLAVIX Take 1 tablet (75 mg total) by mouth daily.   cyanocobalamin 1000 MCG/ML injection Commonly known as: (VITAMIN B-12) Inject 1 mL (1,000 mcg total) into the muscle every 30 (thirty) days.   ferrous sulfate 324 (65 Fe) MG Tbec Take 1 tablet (325 mg total) by mouth every other day.   metoprolol tartrate 25 MG tablet Commonly known as: LOPRESSOR Take 1 tablet (25 mg total) by mouth 2 (two) times daily.   tadalafil 20 MG tablet Commonly known as: CIALIS Take 1 tablet (20 mg total) by mouth  daily as needed for erectile dysfunction. Started by: Zara Council, PA-C   tamsulosin 0.4 MG Caps capsule Commonly known as: FLOMAX Take 1 capsule (0.4 mg total) by mouth daily.       Allergies: No Known Allergies  Family History: Family History  Problem Relation Age of Onset  . Cancer Mother 70       colon  . Cancer Sister        breast  . Cancer Daughter        anal  . CAD Neg Hx   . Stroke Neg Hx   . Diabetes Neg Hx   . Prostate cancer Neg Hx   . Kidney cancer Neg Hx   . Bladder Cancer Neg Hx     Social History:  reports that he quit smoking about 20 years ago. His smoking use included cigarettes. He has a 35.00 pack-year smoking history. He has never used smokeless tobacco. He reports that he does not drink alcohol and does not use drugs.  ROS: For pertinent review of systems please refer to history of present illness  Physical Exam: BP 128/89   Pulse 83   Ht 5\' 7"  (1.702 m)   Wt 173 lb (78.5 kg)   BMI 27.10 kg/m   Constitutional:  Well nourished. Alert and oriented, No acute distress. HEENT: [place] AT, moist mucus membranes.  Trachea midline, no masses. Cardiovascular: No clubbing, cyanosis, or edema. Respiratory: Normal respiratory effort, no increased work of breathing. GI: Abdomen is soft, non tender, non distended, no abdominal masses. Liver and spleen not palpable.  No hernias appreciated.  Stool sample for occult testing is not indicated.   GU: No CVA tenderness.  No bladder fullness or masses.  Patient with circumcised phallus.   Urethral meatus is patent.  No penile discharge. No penile lesions or rashes. Scrotum without lesions, cysts, rashes and/or edema.  Testicles are located scrotally bilaterally. No masses are appreciated in the testicles. Left and right epididymis are normal. Rectal: Patient with  normal sphincter tone. Anus and perineum without scarring or rashes. No rectal masses are appreciated. Prostate is approximately 55 grams, no nodules are  appreciated. Seminal vesicles could not be palpated.   Skin: No rashes, bruises or suspicious lesions. Lymph: No cervical or inguinal adenopathy. Neurologic: Grossly intact, no focal deficits, moving all 4 extremities. Psychiatric: Normal mood and affect.  Laboratory Data: Lab Results  Component Value Date   WBC 8.7  05/31/2019   HGB 10.3 (L) 05/31/2019   HCT 32.8 (L) 05/31/2019   MCV 80.2 05/31/2019   PLT 292.0 05/31/2019    Lab Results  Component Value Date   CREATININE 1.59 (H) 05/31/2019    Lab Results  Component Value Date   PSA 1.93 05/31/2019   PSA 1.73 01/22/2018   PSA 3.25 03/20/2015    Lab Results  Component Value Date   HGBA1C 5.5 07/14/2014    Urinalysis Component     Latest Ref Rng & Units 07/01/2019  Specific Gravity, UA     1.005 - 1.030 >1.030 (H)  pH, UA     5.0 - 7.5 5.0  Color, UA     Yellow Yellow  Appearance Ur     Clear Hazy (A)  Leukocytes,UA     Negative Negative  Protein,UA     Negative/Trace Negative  Glucose, UA     Negative Negative  Ketones, UA     Negative Negative  RBC, UA     Negative Trace (A)  Bilirubin, UA     Negative Negative  Urobilinogen, Ur     0.2 - 1.0 mg/dL 0.2  Nitrite, UA     Negative Negative  Microscopic Examination      See below:   Component     Latest Ref Rng & Units 07/01/2019  WBC, UA     0 - 5 /hpf 0-5  RBC     0 - 2 /hpf 0-2  Epithelial Cells (non renal)     0 - 10 /hpf 0-10  Casts     None seen /lpf Present (A)  Cast Type     N/A Hyaline casts  Bacteria, UA     None seen/Few Few    I have reviewed the labs.   Assessment & Plan:    1.  History of hematuria Completed workup in 2017 and 2019 with CTU, RUS, MRI and cystoscopy 2 - right renal cysts - bilobar prostate hypertrophy with hypervascularity  No reports of gross hematuria UA today is negative for hematuria  Return to clinic in 1 year for repeat UA patient report any gross hematuria in the interim  2. BPH with LU TS IPSS  score is 2/2, it is stable Continue conservative management, avoiding bladder irritants and timed voiding's RTC in 12 months for I PSS and exam  3. ED Given his prescription for Cialis 20 mg.  I advised him to take it 30 minutes prior to intercourse.  I also advised him it may have a delayed effect, so it may not be effective until several hours or even the next day after taking the tablet.  He denies taking any nitrates. Return to clinic in 12 months for [facility] IM and exam   Return in about 1 year (around 06/30/2020) for IPSS, UA, SHIM and exam.  Zara Council, [facility]  [place], [place] [address] [phone]

## 2019-07-05 LAB — URINALYSIS, COMPLETE
Bilirubin, UA: NEGATIVE
Glucose, UA: NEGATIVE
Ketones, UA: NEGATIVE
Leukocytes,UA: NEGATIVE
Nitrite, UA: NEGATIVE
Protein,UA: NEGATIVE
Specific Gravity, UA: >1.03 — ABNORMAL HIGH (ref 1.005–1.030)
Urobilinogen, Ur: 0.2 mg/dL (ref 0.2–1.0)
pH, UA: 5 (ref 5.0–7.5)

## 2019-07-05 LAB — MICROSCOPIC EXAMINATION

## 2019-07-06 ENCOUNTER — Ambulatory Visit: Payer: Medicare HMO | Admitting: Urology

## 2019-07-06 ENCOUNTER — Other Ambulatory Visit: Payer: Self-pay

## 2019-07-13 ENCOUNTER — Ambulatory Visit: Payer: Medicare HMO

## 2019-07-20 ENCOUNTER — Other Ambulatory Visit: Payer: Self-pay

## 2019-07-20 ENCOUNTER — Ambulatory Visit (INDEPENDENT_AMBULATORY_CARE_PROVIDER_SITE_OTHER): Payer: Medicare HMO

## 2019-07-20 DIAGNOSIS — E538 Deficiency of other specified B group vitamins: Secondary | ICD-10-CM | POA: Diagnosis not present

## 2019-07-20 MED ORDER — CYANOCOBALAMIN 1000 MCG/ML IJ SOLN
1000.0000 ug | Freq: Once | INTRAMUSCULAR | Status: AC
  Administered 2019-07-20: 1000 ug via INTRAMUSCULAR

## 2019-07-20 NOTE — Progress Notes (Signed)
Pt given #2 of 6 B12 injections in right deltoid. Pt tolerated well.

## 2019-08-10 ENCOUNTER — Ambulatory Visit: Payer: Medicare HMO

## 2019-09-05 ENCOUNTER — Other Ambulatory Visit: Payer: Self-pay | Admitting: Family Medicine

## 2019-09-05 DIAGNOSIS — N183 Chronic kidney disease, stage 3 unspecified: Secondary | ICD-10-CM

## 2019-09-05 DIAGNOSIS — E785 Hyperlipidemia, unspecified: Secondary | ICD-10-CM

## 2019-09-05 DIAGNOSIS — E538 Deficiency of other specified B group vitamins: Secondary | ICD-10-CM

## 2019-09-05 DIAGNOSIS — D509 Iron deficiency anemia, unspecified: Secondary | ICD-10-CM

## 2019-09-06 ENCOUNTER — Other Ambulatory Visit: Payer: Medicare HMO

## 2019-09-07 ENCOUNTER — Other Ambulatory Visit (INDEPENDENT_AMBULATORY_CARE_PROVIDER_SITE_OTHER): Payer: Medicare HMO

## 2019-09-07 ENCOUNTER — Other Ambulatory Visit: Payer: Self-pay

## 2019-09-07 ENCOUNTER — Ambulatory Visit (INDEPENDENT_AMBULATORY_CARE_PROVIDER_SITE_OTHER): Payer: Medicare HMO

## 2019-09-07 DIAGNOSIS — D509 Iron deficiency anemia, unspecified: Secondary | ICD-10-CM | POA: Diagnosis not present

## 2019-09-07 DIAGNOSIS — E785 Hyperlipidemia, unspecified: Secondary | ICD-10-CM

## 2019-09-07 DIAGNOSIS — E538 Deficiency of other specified B group vitamins: Secondary | ICD-10-CM

## 2019-09-07 DIAGNOSIS — N183 Chronic kidney disease, stage 3 unspecified: Secondary | ICD-10-CM

## 2019-09-07 MED ORDER — CYANOCOBALAMIN 1000 MCG/ML IJ SOLN
1000.0000 ug | Freq: Once | INTRAMUSCULAR | Status: AC
  Administered 2019-09-07: 1000 ug via INTRAMUSCULAR

## 2019-09-07 NOTE — Progress Notes (Signed)
Per orders of Dr. Danise Mina, #3 of 6  injection of B12 given into left deltiod by Loreen Freud. Patient tolerated injection well.

## 2019-09-07 NOTE — Addendum Note (Signed)
Addended by: Cloyd Stagers on: 09/07/2019 03:53 PM   Modules accepted: Orders

## 2019-09-08 ENCOUNTER — Ambulatory Visit: Payer: Medicare HMO | Admitting: Gastroenterology

## 2019-09-08 ENCOUNTER — Other Ambulatory Visit: Payer: Self-pay

## 2019-09-08 DIAGNOSIS — D508 Other iron deficiency anemias: Secondary | ICD-10-CM

## 2019-09-08 LAB — IBC PANEL
Iron: 201 ug/dL — ABNORMAL HIGH (ref 42–165)
Saturation Ratios: 44.3 % (ref 20.0–50.0)
Transferrin: 324 mg/dL (ref 212.0–360.0)

## 2019-09-08 LAB — CBC WITH DIFFERENTIAL/PLATELET
Basophils Absolute: 0 10*3/uL (ref 0.0–0.1)
Basophils Relative: 0.5 % (ref 0.0–3.0)
Eosinophils Absolute: 0.5 10*3/uL (ref 0.0–0.7)
Eosinophils Relative: 5.8 % — ABNORMAL HIGH (ref 0.0–5.0)
HCT: 35.5 % — ABNORMAL LOW (ref 39.0–52.0)
Hemoglobin: 11.2 g/dL — ABNORMAL LOW (ref 13.0–17.0)
Lymphocytes Relative: 24.8 % (ref 12.0–46.0)
Lymphs Abs: 2.3 10*3/uL (ref 0.7–4.0)
MCHC: 31.4 g/dL (ref 30.0–36.0)
MCV: 81.3 fl (ref 78.0–100.0)
Monocytes Absolute: 0.8 10*3/uL (ref 0.1–1.0)
Monocytes Relative: 9 % (ref 3.0–12.0)
Neutro Abs: 5.5 10*3/uL (ref 1.4–7.7)
Neutrophils Relative %: 59.9 % (ref 43.0–77.0)
Platelets: 268 10*3/uL (ref 150.0–400.0)
RBC: 4.37 Mil/uL (ref 4.22–5.81)
RDW: 20.8 % — ABNORMAL HIGH (ref 11.5–15.5)
WBC: 9.1 10*3/uL (ref 4.0–10.5)

## 2019-09-08 LAB — COMPREHENSIVE METABOLIC PANEL
ALT: 12 U/L (ref 0–53)
AST: 13 U/L (ref 0–37)
Albumin: 4 g/dL (ref 3.5–5.2)
Alkaline Phosphatase: 108 U/L (ref 39–117)
BUN: 15 mg/dL (ref 6–23)
CO2: 26 mEq/L (ref 19–32)
Calcium: 8.8 mg/dL (ref 8.4–10.5)
Chloride: 104 mEq/L (ref 96–112)
Creatinine, Ser: 1.66 mg/dL — ABNORMAL HIGH (ref 0.40–1.50)
GFR: 40.47 mL/min — ABNORMAL LOW (ref >60.00)
Glucose, Bld: 93 mg/dL (ref 70–99)
Potassium: 4.4 mEq/L (ref 3.5–5.1)
Sodium: 138 mEq/L (ref 135–145)
Total Bilirubin: 0.4 mg/dL (ref 0.2–1.2)
Total Protein: 6.5 g/dL (ref 6.0–8.3)

## 2019-09-08 LAB — VITAMIN B12: Vitamin B-12: 164 pg/mL — ABNORMAL LOW (ref 211–911)

## 2019-09-08 LAB — LDL CHOLESTEROL, DIRECT: Direct LDL: 46 mg/dL

## 2019-09-08 LAB — LIPID PANEL
Cholesterol: 111 mg/dL (ref 0–200)
HDL: 38.5 mg/dL — ABNORMAL LOW (ref >39.00)
NonHDL: 72.74
Total CHOL/HDL Ratio: 3
Triglycerides: 212 mg/dL — ABNORMAL HIGH (ref 0.0–149.0)
VLDL: 42.4 mg/dL — ABNORMAL HIGH (ref 0.0–40.0)

## 2019-09-08 LAB — VITAMIN D 25 HYDROXY (VIT D DEFICIENCY, FRACTURES): VITD: 24.11 ng/mL — ABNORMAL LOW (ref 30.00–100.00)

## 2019-09-08 LAB — FERRITIN: Ferritin: 14.1 ng/mL — ABNORMAL LOW (ref 22.0–322.0)

## 2019-09-08 LAB — MICROALBUMIN / CREATININE URINE RATIO
Creatinine,U: 148.2 mg/dL
Microalb Creat Ratio: 0.5 mg/g (ref 0.0–30.0)
Microalb, Ur: <0.7 mg/dL (ref 0.0–1.9)

## 2019-09-08 NOTE — Progress Notes (Deleted)
Albert Bellows MD, MRCP(U.K) [address]  [place]  [place], [place] [address]  Main: [phone]  Fax: [phone]   Primary Care Physician: Albert Bush, MD  Primary Gastroenterologist:  Dr. Jonathon Giles   Follow-up for anemia  HPI: Albert Giles is a 74 y.o. male    Summary of history :  Initially referred and seen in June 2021 for anemia. year back hemoglobin is 13.8 g but when checked in April 2021 was 10.5 g with an MCV of 83.8.  Fecal occult blood test was positive which has previously been negative.  In May 2021 folate was normal but vitamin B12 levels were low at 120.  Iron studies showed a ferritin of 8.1.  Repeat hemoglobin in May demonstrated hemoglobin 10.3 g.  Denies any nosebleeds, rectal bleeding, weight loss, hematemesis, blood in urine.  He has been on Plavix.  Never had a colonoscopy.  Mother had colon cancer.  No other complaints.  Interval history   ***/***/2020-  ***/***/2020  ***   Current Outpatient Medications  Medication Sig Dispense Refill  . aspirin EC 81 MG tablet Take 1 tablet (81 mg total) by mouth daily. 60 tablet 1  . atorvastatin (LIPITOR) 40 MG tablet Take 1 tablet (40 mg total) by mouth daily at 6 PM. 60 tablet 1  . clopidogrel (PLAVIX) 75 MG tablet Take 1 tablet (75 mg total) by mouth daily. 60 tablet 1  . cyanocobalamin (,VITAMIN B-12,) 1000 MCG/ML injection Inject 1 mL (1,000 mcg total) into the muscle every 30 (thirty) days.    . ferrous sulfate 324 (65 Fe) MG TBEC Take 1 tablet (325 mg total) by mouth every other day. 90 tablet 0  . metoprolol tartrate (LOPRESSOR) 25 MG tablet Take 1 tablet (25 mg total) by mouth 2 (two) times daily. 60 tablet 1  . tadalafil (CIALIS) 20 MG tablet Take 1 tablet (20 mg total) by mouth daily as needed for erectile dysfunction. 20 tablet 3  . tamsulosin (FLOMAX) 0.4 MG CAPS capsule Take 1 capsule (0.4 mg total) by mouth daily. (Patient not taking: Reported on 07/01/2019) 90 capsule 1   No current  facility-administered medications for this visit.    Allergies as of 09/08/2019  . (No Known Allergies)    ROS:  General: Negative for anorexia, weight loss, fever, chills, fatigue, weakness. ENT: Negative for hoarseness, difficulty swallowing , nasal congestion. CV: Negative for chest pain, angina, palpitations, dyspnea on exertion, peripheral edema.  Respiratory: Negative for dyspnea at rest, dyspnea on exertion, cough, sputum, wheezing.  GI: See history of present illness. GU:  Negative for dysuria, hematuria, urinary incontinence, urinary frequency, nocturnal urination.  Endo: Negative for unusual weight change.    Physical Examination:   There were no vitals taken for this visit.  General: Well-nourished, well-developed in no acute distress.  Eyes: No icterus. Conjunctivae pink. Mouth: Oropharyngeal mucosa moist and pink , no lesions erythema or exudate. Lungs: Clear to auscultation bilaterally. Non-labored. Heart: Regular rate and rhythm, no murmurs rubs or gallops.  Abdomen: Bowel sounds are normal, nontender, nondistended, no hepatosplenomegaly or masses, no abdominal bruits or hernia , no rebound or guarding.   Extremities: No lower extremity edema. No clubbing or deformities. Neuro: Alert and oriented x 3.  Grossly intact. Skin: Warm and dry, no jaundice.   Psych: Alert and cooperative, normal mood and affect.   Imaging Studies: No results found.  Assessment and Plan:   [name] is a 74 y.o. y/o male ***  Dr Albert Bellows  MD,MRCP [facility]) Follow up in ***

## 2019-09-09 ENCOUNTER — Telehealth: Payer: Self-pay | Admitting: Gastroenterology

## 2019-09-09 DIAGNOSIS — I2510 Atherosclerotic heart disease of native coronary artery without angina pectoris: Secondary | ICD-10-CM | POA: Diagnosis not present

## 2019-09-09 DIAGNOSIS — I214 Non-ST elevation (NSTEMI) myocardial infarction: Secondary | ICD-10-CM | POA: Diagnosis not present

## 2019-09-09 DIAGNOSIS — Z9861 Coronary angioplasty status: Secondary | ICD-10-CM | POA: Diagnosis not present

## 2019-09-09 DIAGNOSIS — N1832 Chronic kidney disease, stage 3b: Secondary | ICD-10-CM | POA: Diagnosis not present

## 2019-09-09 DIAGNOSIS — E785 Hyperlipidemia, unspecified: Secondary | ICD-10-CM | POA: Diagnosis not present

## 2019-09-09 NOTE — Telephone Encounter (Signed)
Patient states he wants to cancel procedure for 8.31.21 and would like a cb to discuss rescheduling late in September.

## 2019-09-10 ENCOUNTER — Other Ambulatory Visit: Admission: RE | Admit: 2019-09-10 | Payer: Medicare HMO | Source: Ambulatory Visit

## 2019-09-13 ENCOUNTER — Other Ambulatory Visit: Payer: Self-pay | Admitting: Family Medicine

## 2019-09-13 MED ORDER — B-12 1000 MCG SL SUBL: 1.0000 | SUBLINGUAL_TABLET | Freq: Every day | SUBLINGUAL | Status: DC

## 2019-09-13 MED ORDER — VITAMIN D3 25 MCG (1000 UT) PO CAPS: 1.0000 | ORAL_CAPSULE | Freq: Every day | ORAL | Status: DC

## 2019-09-13 NOTE — Progress Notes (Signed)
Noted  

## 2019-09-14 ENCOUNTER — Encounter: Admission: RE | Payer: Self-pay | Source: Home / Self Care

## 2019-09-14 ENCOUNTER — Ambulatory Visit: Admission: RE | Admit: 2019-09-14 | Payer: Medicare HMO | Source: Home / Self Care | Admitting: Gastroenterology

## 2019-09-14 SURGERY — COLONOSCOPY WITH PROPOFOL
Anesthesia: General

## 2019-09-28 NOTE — Telephone Encounter (Signed)
Called pt to inquire if he's ready to reschedule his procedure. Pt states he's prefers to wait until the [place] 19 pandemic has improved. Pt plans to contact our office when he's ready to schedule.

## 2019-10-05 ENCOUNTER — Ambulatory Visit (INDEPENDENT_AMBULATORY_CARE_PROVIDER_SITE_OTHER): Payer: Medicare HMO | Admitting: *Deleted

## 2019-10-05 ENCOUNTER — Other Ambulatory Visit: Payer: Self-pay

## 2019-10-05 DIAGNOSIS — Z23 Encounter for immunization: Secondary | ICD-10-CM

## 2019-10-05 DIAGNOSIS — E538 Deficiency of other specified B group vitamins: Secondary | ICD-10-CM | POA: Diagnosis not present

## 2019-10-05 MED ORDER — CYANOCOBALAMIN 1000 MCG/ML IJ SOLN
1000.0000 ug | Freq: Once | INTRAMUSCULAR | Status: AC
  Administered 2019-10-05: 1000 ug via INTRAMUSCULAR

## 2019-10-05 NOTE — Progress Notes (Signed)
Per orders of Dr. Damita Dunnings injection of Vitamin B12 & Hight Dose Flu Vaccine given by Lauralyn Primes. Patient tolerated injection well.

## 2019-11-02 ENCOUNTER — Other Ambulatory Visit: Payer: Self-pay

## 2019-11-02 ENCOUNTER — Ambulatory Visit (INDEPENDENT_AMBULATORY_CARE_PROVIDER_SITE_OTHER): Payer: Medicare HMO

## 2019-11-02 DIAGNOSIS — E538 Deficiency of other specified B group vitamins: Secondary | ICD-10-CM | POA: Diagnosis not present

## 2019-11-02 MED ORDER — CYANOCOBALAMIN 1000 MCG/ML IJ SOLN
1000.0000 ug | Freq: Once | INTRAMUSCULAR | Status: AC
  Administered 2019-11-02: 1000 ug via INTRAMUSCULAR

## 2019-11-02 NOTE — Progress Notes (Signed)
Per orders of Dr.J Gitoerrez, injection of vitamin b12 given by Kem Parkinson. Patient tolerated injection well.

## 2020-03-08 DIAGNOSIS — I214 Non-ST elevation (NSTEMI) myocardial infarction: Secondary | ICD-10-CM | POA: Diagnosis not present

## 2020-03-08 DIAGNOSIS — Z9861 Coronary angioplasty status: Secondary | ICD-10-CM | POA: Diagnosis not present

## 2020-03-08 DIAGNOSIS — E785 Hyperlipidemia, unspecified: Secondary | ICD-10-CM | POA: Diagnosis not present

## 2020-03-08 DIAGNOSIS — I2510 Atherosclerotic heart disease of native coronary artery without angina pectoris: Secondary | ICD-10-CM | POA: Diagnosis not present

## 2020-03-23 ENCOUNTER — Other Ambulatory Visit: Payer: Self-pay | Admitting: Urology

## 2020-03-23 DIAGNOSIS — N138 Other obstructive and reflux uropathy: Secondary | ICD-10-CM

## 2020-03-23 DIAGNOSIS — N401 Enlarged prostate with lower urinary tract symptoms: Secondary | ICD-10-CM

## 2020-03-23 DIAGNOSIS — N529 Male erectile dysfunction, unspecified: Secondary | ICD-10-CM

## 2020-06-11 ENCOUNTER — Other Ambulatory Visit: Payer: Self-pay | Admitting: Family Medicine

## 2020-06-11 DIAGNOSIS — E538 Deficiency of other specified B group vitamins: Secondary | ICD-10-CM

## 2020-06-11 DIAGNOSIS — E559 Vitamin D deficiency, unspecified: Secondary | ICD-10-CM | POA: Insufficient documentation

## 2020-06-11 DIAGNOSIS — N183 Chronic kidney disease, stage 3 unspecified: Secondary | ICD-10-CM

## 2020-06-11 DIAGNOSIS — D509 Iron deficiency anemia, unspecified: Secondary | ICD-10-CM

## 2020-06-11 DIAGNOSIS — E785 Hyperlipidemia, unspecified: Secondary | ICD-10-CM

## 2020-06-11 DIAGNOSIS — N401 Enlarged prostate with lower urinary tract symptoms: Secondary | ICD-10-CM

## 2020-06-11 DIAGNOSIS — N138 Other obstructive and reflux uropathy: Secondary | ICD-10-CM

## 2020-06-14 ENCOUNTER — Other Ambulatory Visit: Payer: Self-pay

## 2020-06-14 ENCOUNTER — Other Ambulatory Visit (INDEPENDENT_AMBULATORY_CARE_PROVIDER_SITE_OTHER): Payer: Medicare HMO

## 2020-06-14 DIAGNOSIS — E538 Deficiency of other specified B group vitamins: Secondary | ICD-10-CM

## 2020-06-14 DIAGNOSIS — N183 Chronic kidney disease, stage 3 unspecified: Secondary | ICD-10-CM

## 2020-06-14 DIAGNOSIS — N138 Other obstructive and reflux uropathy: Secondary | ICD-10-CM | POA: Diagnosis not present

## 2020-06-14 DIAGNOSIS — D509 Iron deficiency anemia, unspecified: Secondary | ICD-10-CM

## 2020-06-14 DIAGNOSIS — E785 Hyperlipidemia, unspecified: Secondary | ICD-10-CM

## 2020-06-14 DIAGNOSIS — E559 Vitamin D deficiency, unspecified: Secondary | ICD-10-CM | POA: Diagnosis not present

## 2020-06-14 DIAGNOSIS — N401 Enlarged prostate with lower urinary tract symptoms: Secondary | ICD-10-CM

## 2020-06-14 LAB — VITAMIN D 25 HYDROXY (VIT D DEFICIENCY, FRACTURES): VITD: 28.02 ng/mL — ABNORMAL LOW (ref 30.00–100.00)

## 2020-06-14 LAB — CBC WITH DIFFERENTIAL/PLATELET
Basophils Absolute: 0.1 10*3/uL (ref 0.0–0.1)
Basophils Relative: 1.4 % (ref 0.0–3.0)
Eosinophils Absolute: 0.4 10*3/uL (ref 0.0–0.7)
Eosinophils Relative: 4.5 % (ref 0.0–5.0)
HCT: 31.6 % — ABNORMAL LOW (ref 39.0–52.0)
Hemoglobin: 9.7 g/dL — ABNORMAL LOW (ref 13.0–17.0)
Lymphocytes Relative: 22.4 % (ref 12.0–46.0)
Lymphs Abs: 2 10*3/uL (ref 0.7–4.0)
MCHC: 30.6 g/dL (ref 30.0–36.0)
MCV: 76.9 fl — ABNORMAL LOW (ref 78.0–100.0)
Monocytes Absolute: 0.9 10*3/uL (ref 0.1–1.0)
Monocytes Relative: 9.4 % (ref 3.0–12.0)
Neutro Abs: 5.7 10*3/uL (ref 1.4–7.7)
Neutrophils Relative %: 62.3 % (ref 43.0–77.0)
Platelets: 313 10*3/uL (ref 150.0–400.0)
RBC: 4.11 Mil/uL — ABNORMAL LOW (ref 4.22–5.81)
RDW: 17.8 % — ABNORMAL HIGH (ref 11.5–15.5)
WBC: 9.1 10*3/uL (ref 4.0–10.5)

## 2020-06-14 LAB — FERRITIN: Ferritin: 8.5 ng/mL — ABNORMAL LOW (ref 22.0–322.0)

## 2020-06-14 LAB — LIPID PANEL
Cholesterol: 106 mg/dL (ref 0–200)
HDL: 39.5 mg/dL (ref >39.00)
LDL Cholesterol: 43 mg/dL (ref 0–99)
NonHDL: 66.68
Total CHOL/HDL Ratio: 3
Triglycerides: 118 mg/dL (ref 0.0–149.0)
VLDL: 23.6 mg/dL (ref 0.0–40.0)

## 2020-06-14 LAB — COMPREHENSIVE METABOLIC PANEL
ALT: 8 U/L (ref 0–53)
AST: 11 U/L (ref 0–37)
Albumin: 4.1 g/dL (ref 3.5–5.2)
Alkaline Phosphatase: 114 U/L (ref 39–117)
BUN: 16 mg/dL (ref 6–23)
CO2: 24 mEq/L (ref 19–32)
Calcium: 9.5 mg/dL (ref 8.4–10.5)
Chloride: 106 mEq/L (ref 96–112)
Creatinine, Ser: 1.77 mg/dL — ABNORMAL HIGH (ref 0.40–1.50)
GFR: 36.75 mL/min — ABNORMAL LOW (ref >60.00)
Glucose, Bld: 101 mg/dL — ABNORMAL HIGH (ref 70–99)
Potassium: 4.2 mEq/L (ref 3.5–5.1)
Sodium: 139 mEq/L (ref 135–145)
Total Bilirubin: 0.4 mg/dL (ref 0.2–1.2)
Total Protein: 6.5 g/dL (ref 6.0–8.3)

## 2020-06-14 LAB — IBC PANEL
Iron: 11 ug/dL — ABNORMAL LOW (ref 42–165)
Saturation Ratios: 2.2 % — ABNORMAL LOW (ref 20.0–50.0)
Transferrin: 357 mg/dL (ref 212.0–360.0)

## 2020-06-14 LAB — FOLATE: Folate: 10.4 ng/mL (ref >5.9)

## 2020-06-14 LAB — VITAMIN B12: Vitamin B-12: 333 pg/mL (ref 211–911)

## 2020-06-14 LAB — PSA: PSA: 2.64 ng/mL (ref 0.10–4.00)

## 2020-06-16 ENCOUNTER — Ambulatory Visit (INDEPENDENT_AMBULATORY_CARE_PROVIDER_SITE_OTHER): Payer: Medicare HMO

## 2020-06-16 ENCOUNTER — Other Ambulatory Visit: Payer: Self-pay

## 2020-06-16 DIAGNOSIS — Z0000 Encounter for general adult medical examination without abnormal findings: Secondary | ICD-10-CM | POA: Diagnosis not present

## 2020-06-16 LAB — MICROALBUMIN / CREATININE URINE RATIO
Creatinine,U: 232.1 mg/dL
Microalb Creat Ratio: 0.4 mg/g (ref 0.0–30.0)
Microalb, Ur: 1 mg/dL (ref 0.0–1.9)

## 2020-06-16 NOTE — Progress Notes (Signed)
Subjective:   Albert Giles is a 75 y.o. male who presents for Medicare Annual/Subsequent preventive examination.  Review of Systems: N/A      I connected with the patient today by telephone and verified that I am speaking with the correct person using two identifiers. Location patient: home Location nurse: work Persons participating in the telephone visit: patient, nurse.   I discussed the limitations, risks, security and privacy concerns of performing an evaluation and management service by telephone and the availability of in person appointments. I also discussed with the patient that there may be a patient responsible charge related to this service. The patient expressed understanding and verbally consented to this telephonic visit.        Cardiac Risk Factors include: advanced age (>61men, >60 women);male gender;Other (see comment), Risk factor comments: hyperlipidemia     Objective:    Today's Vitals   There is no height or weight on file to calculate BMI.  Advanced Directives 06/16/2020 04/15/2019 01/17/2017 07/13/2014  Does Patient Have a Medical Advance Directive? No No No No  Would patient like information on creating a medical advance directive? No - Patient declined No - Patient declined No - Patient declined No - patient declined information    Current Medications (verified) Outpatient Encounter Medications as of 06/16/2020  Medication Sig  . aspirin EC 81 MG tablet Take 1 tablet (81 mg total) by mouth daily.  Marland Kitchen atorvastatin (LIPITOR) 40 MG tablet Take 1 tablet (40 mg total) by mouth daily at 6 PM.  . Cholecalciferol (VITAMIN D3) 25 MCG (1000 UT) CAPS Take 1 capsule (1,000 Units total) by mouth daily.  . clopidogrel (PLAVIX) 75 MG tablet Take 1 tablet (75 mg total) by mouth daily.  . cyanocobalamin (,VITAMIN B-12,) 1000 MCG/ML injection Inject 1 mL (1,000 mcg total) into the muscle every 30 (thirty) days.  . Cyanocobalamin (B-12) 1000 MCG SUBL Place 1 tablet under the  tongue daily.  . metoprolol tartrate (LOPRESSOR) 25 MG tablet Take 1 tablet (25 mg total) by mouth 2 (two) times daily.  . tadalafil (CIALIS) 20 MG tablet TAKE ONE TABLET BY MOUTH DAILY AS NEEDED FOR ERECTILE DYSFUNCTION  . tamsulosin (FLOMAX) 0.4 MG CAPS capsule Take 1 capsule (0.4 mg total) by mouth daily.  . ferrous sulfate 324 (65 Fe) MG TBEC Take 1 tablet (325 mg total) by mouth every other day.   No facility-administered encounter medications on file as of 06/16/2020.    Allergies (verified) Patient has no known allergies.   History: Past Medical History:  Diagnosis Date  . CAD (coronary artery disease) 06/2014   UA with NSTEMI - cath with 99% prox L circ s/p stent, EF 40% ([place] [place] at [facility])  . Emphysema    mild  . Ex-smoker   . NSTEMI (non-ST elevated myocardial infarction) ([facility]) 07/13/2014   Past Surgical History:  Procedure Laterality Date  . CARDIAC CATHETERIZATION N/A 07/14/2014   Left Heart Cath and Coronary Angiography with stent placement;  Surgeon: Teodoro Spray, MD  . CARDIAC CATHETERIZATION N/A 07/14/2014   Coronary Stent Intervention;  Surgeon: Yolonda Kida, MD  . CYSTECTOMY     on back  . INGUINAL HERNIA REPAIR Right 08/17/04   Family History  Problem Relation Age of Onset  . Cancer Mother 18       colon  . Cancer Sister        breast  . Cancer Daughter        anal  . CAD  Neg Hx   . Stroke Neg Hx   . Diabetes Neg Hx   . Prostate cancer Neg Hx   . Kidney cancer Neg Hx   . Bladder Cancer Neg Hx    Social History   Socioeconomic History  . Marital status: Married    Spouse name: Not on file  . Number of children: Not on file  . Years of education: Not on file  . Highest education level: Not on file  Occupational History  . Not on file  Tobacco Use  . Smoking status: Former Smoker    Packs/day: 1.00    Years: 35.00    Pack years: 35.00    Types: Cigarettes    Quit date: 07/07/1998    Years since quitting: 21.9  . Smokeless tobacco:  Never Used  Vaping Use  . Vaping Use: Never used  Substance and Sexual Activity  . Alcohol use: No  . Drug use: No  . Sexual activity: Not on file  Other Topics Concern  . Not on file  Social History Narrative   Caffeine: 2 cups coffee, 2 cups tea/day   Lives with wife   Occupation: Higher education careers adviser, retired   Edu: HS   Activity: works in yard, walking   Diet: some water, fruits/vegetables daily   Social Determinants of Radio broadcast assistant Strain: [place]   . Difficulty of Paying Living Expenses: Not hard at all  Food Insecurity: No Food Insecurity  . Worried About Charity fundraiser in the Last Year: Never true  . Ran Out of Food in the Last Year: Never true  Transportation Needs: No Transportation Needs  . Lack of Transportation (Medical): No  . Lack of Transportation (Non-Medical): No  Physical Activity: Inactive  . Days of Exercise per Week: 0 days  . Minutes of Exercise per Session: 0 min  Stress: No Stress Concern Present  . Feeling of Stress : Not at all  Social Connections: Not on file    Tobacco Counseling Counseling given: Not Answered   Clinical Intake:  Pre-visit preparation completed: Yes  Pain : No/denies pain     Nutritional Risks: None Diabetes: No  How often do you need to have someone help you when you read instructions, pamphlets, or other written materials from your doctor or pharmacy?: 1 - Never What is the last grade level you completed in school?: 12th  Diabetic: No Nutrition Risk Assessment:  Has the patient had any N/V/D within the last 2 months?  No  Does the patient have any non-healing wounds?  No  Has the patient had any unintentional weight loss or weight gain?  No   Diabetes:  Is the patient diabetic?  No  If diabetic, was a CBG obtained today?  N/A Did the patient bring in their glucometer from home?  N/A How often do you monitor your CBG's? N/A.   Financial Strains and Diabetes Management:  Are you  having any financial strains with the device, your supplies or your medication? N/A.  Does the patient want to be seen by Chronic Care Management for management of their diabetes?  N/A Would the patient like to be referred to a Nutritionist or for Diabetic Management?  N/A  Interpreter Needed?: No  Information entered by :: CJohnson, LPN   Activities of Daily Living In your present state of health, do you have any difficulty performing the following activities: 06/16/2020  Hearing? N  Vision? N  Difficulty concentrating or making decisions? N  Walking or climbing stairs? N  Dressing or bathing? N  Doing errands, shopping? N  Preparing Food and eating ? N  Using the Toilet? N  In the past six months, have you accidently leaked urine? N  Do you have problems with loss of bowel control? N  Managing your Medications? N  Managing your Finances? N  Housekeeping or managing your Housekeeping? N  Some recent data might be hidden    Patient Care Team: [name], MD as PCP - General (Family Medicine)  Indicate any recent Medical Services you may have received from other than [facility] providers in the past year (date may be approximate).     Assessment:   This is a routine wellness examination for [place].  Hearing/Vision screen  Hearing Screening   125Hz  250Hz  500Hz  1000Hz  2000Hz  3000Hz  4000Hz  6000Hz  8000Hz   Right ear:           Left ear:           Vision Screening Comments: Advised patient to get annual eye exams   Dietary issues and exercise activities discussed: Current Exercise Habits: The patient does not participate in regular exercise at present, Exercise limited by: None identified  Goals Addressed            This Visit's Progress   . Patient Stated       06/16/2020, I will maintain and continue medications as prescribed.       Depression Screen PHQ 2/9 Scores 06/16/2020 04/15/2019 01/27/2018 01/17/2017 01/01/2016 12/30/2014 12/27/2013  PHQ - 2 Score 0 0 0 0 0 0 0   PHQ- 9 Score 0 0 - 0 - - -    Fall Risk Fall Risk  06/16/2020 04/15/2019 01/27/2018 01/17/2017 01/01/2016  Falls in the past year? 0 0 0 No No  Number falls in past yr: 0 0 - - -  Injury with Fall? 0 0 - - -  Risk for fall due to : Medication side effect Medication side effect - - -  Follow up Falls evaluation completed;Falls prevention discussed Falls evaluation completed;Falls prevention discussed - - -    FALL RISK PREVENTION PERTAINING TO THE HOME:  Any stairs in or around the home? Yes  If so, are there any without handrails? No  Home free of loose throw rugs in walkways, pet beds, electrical cords, etc? Yes  Adequate lighting in your home to reduce risk of falls? Yes   ASSISTIVE DEVICES UTILIZED TO PREVENT FALLS:  Life alert? No  Use of a cane, walker or w/c? No  Grab bars in the bathroom? No  Shower chair or bench in shower? No  Elevated toilet seat or a handicapped toilet? No   TIMED UP AND GO:  Was the test performed? N/A telephone visit .    Cognitive Function: MMSE - Mini Mental State Exam 06/16/2020 04/15/2019 01/17/2017  Orientation to time 5 5 5   Orientation to Place 5 5 5   Registration 3 3 3   Attention/ Calculation 5 5 0  Recall 2 3 2   Recall-comments - - unable to recall 1 of 3 words  Language- name 2 objects - - 0  Language- repeat 1 1 1   Language- follow 3 step command - - 3  Language- read & follow direction - - 0  Write a sentence - - 0  Copy design - - 0  Total score - - 19  Mini Cog  Mini-Cog screen was completed. Maximum score is 22. A value of 0 denotes this part  of the MMSE was not completed or the patient failed this part of the Mini-Cog screening.       Immunizations Immunization History  Administered Date(s) Administered  . Fluad Quad(high Dose 65+) 09/16/2018, 10/05/2019  . Influenza,inj,Quad PF,6+ Mos 12/30/2014, 11/28/2015, 12/03/2016, 11/06/2017  . Pneumococcal Conjugate-13 12/27/2013  . Pneumococcal Polysaccharide-23 01/01/2016  . Td  01/15/2007    TDAP status: Due, Education has been provided regarding the importance of this vaccine. Advised may receive this vaccine at local pharmacy or Health Dept. Aware to provide a copy of the vaccination record if obtained from local pharmacy or Health Dept. Verbalized acceptance and understanding.  Flu Vaccine status: Up to date  Pneumococcal vaccine status: Up to date  Covid-19 vaccine status: Completed vaccines Patient will bring card to visit so we can document in chart  Qualifies for Shingles Vaccine? Yes   Zostavax completed No   Shingrix Completed?: No.    Education has been provided regarding the importance of this vaccine. Patient has been advised to call insurance company to determine out of pocket expense if they have not yet received this vaccine. Advised may also receive vaccine at local pharmacy or Health Dept. Verbalized acceptance and understanding.  Screening Tests Health Maintenance  Topic Date Due  . COVID-19 Vaccine (1) Never done  . Zoster Vaccines- Shingrix (1 of 2) Never done  . Pneumococcal Vaccine 53-5 Years old (1 of 2 - PPSV23) Never done  . TETANUS/TDAP  06/17/2023 (Originally 01/14/2017)  . INFLUENZA VACCINE  08/14/2020  . Hepatitis C Screening  Completed  . PNA vac Low Risk Adult  Completed  . HPV VACCINES  Aged Out    Health Maintenance  Health Maintenance Due  Topic Date Due  . COVID-19 Vaccine (1) Never done  . Zoster Vaccines- Shingrix (1 of 2) Never done  . Pneumococcal Vaccine 51-24 Years old (1 of 2 - PPSV23) Never done    Colorectal cancer screening: due, will discuss with provider at physical   Lung Cancer Screening: (Low Dose CT Chest recommended if Age 52-80 years, 30 pack-year currently smoking OR have quit w/in 15years.) does not qualify.    Additional Screening:  Hepatitis C Screening: does qualify; Completed 12/25/2015  Vision Screening: Recommended annual ophthalmology exams for early detection of glaucoma and other  disorders of the eye. Is the patient up to date with their annual eye exam?  No  Who is the provider or what is the name of the office in which the patient attends annual eye exams? Does not have one currently If pt is not established with a provider, would they like to be referred to a provider to establish care? No .   Dental Screening: Recommended annual dental exams for proper oral hygiene  Community Resource Referral / Chronic Care Management: CRR required this visit?  No   CCM required this visit?  No      Plan:     I have personally reviewed and noted the following in the patient's chart:   . Medical and social history . Use of alcohol, tobacco or illicit drugs  . Current medications and supplements including opioid prescriptions. Patient is not currently taking opioid prescriptions. . Functional ability and status . Nutritional status . Physical activity . Advanced directives . List of other physicians . Hospitalizations, surgeries, and ER visits in previous 12 months . Vitals . Screenings to include cognitive, depression, and falls . Referrals and appointments  In addition, I have reviewed and discussed with patient certain  preventive protocols, quality metrics, and best practice recommendations. A written personalized care plan for preventive services as well as general preventive health recommendations were provided to patient.   Due to this being a telephonic visit, the after visit summary with patients personalized plan was offered to patient via office or my-chart. Patient preferred to pick up at office at next visit or via mychart.   Bel[place], [place], LPN   05/15/5908

## 2020-06-16 NOTE — Progress Notes (Signed)
PCP notes:  Health Maintenance: Tdap- insurance shingrix- due Colonoscopy- due Will bring covid card to appointment   Abnormal Screenings: none   Patient concerns: none   Nurse concerns: none   Next PCP appt.: 06/21/2020 @ 3 pm

## 2020-06-16 NOTE — Patient Instructions (Signed)
[name] , Thank you for taking time to come for your Medicare Wellness Visit. I appreciate your ongoing commitment to your health goals. Please review the following plan we discussed and let me know if I can assist you in the future.   Screening recommendations/referrals: Colonoscopy: due, Please discuss with provider at physical  Recommended yearly ophthalmology/optometry visit for glaucoma screening and checkup Recommended yearly dental visit for hygiene and checkup  Vaccinations: Influenza vaccine: Up to date, completed 10/05/2019, due 08/2020 Pneumococcal vaccine: Completed series Tdap vaccine: decline-insurance  Shingles vaccine: due, check with your insurance regarding coverage if interested    Covid-19: Completed series, please bring card to appointment so chart can be updated   Advanced directives: Advance directive discussed with you today. Even though you declined this today please call our office should you change your mind and we can give you the proper paperwork for you to fill out.   Conditions/risks identified: hyperlipidemia   Next appointment: Follow up in one year for your annual wellness visit.   Preventive Care 18 Years and Older, Male Preventive care refers to lifestyle choices and visits with your health care provider that can promote health and wellness. What does preventive care include?  A yearly physical exam. This is also called an annual well check.  Dental exams once or twice a year.  Routine eye exams. Ask your health care provider how often you should have your eyes checked.  Personal lifestyle choices, including:  Daily care of your teeth and gums.  Regular physical activity.  Eating a healthy diet.  Avoiding tobacco and drug use.  Limiting alcohol use.  Practicing safe sex.  Taking low doses of aspirin every day.  Taking vitamin and mineral supplements as recommended by your health care provider. What happens during an annual well  check? The services and screenings done by your health care provider during your annual well check will depend on your age, overall health, lifestyle risk factors, and family history of disease. Counseling  Your health care provider may ask you questions about your:  Alcohol use.  Tobacco use.  Drug use.  Emotional well-being.  Home and relationship well-being.  Sexual activity.  Eating habits.  History of falls.  Memory and ability to understand (cognition).  Work and work Statistician. Screening  You may have the following tests or measurements:  Height, weight, and BMI.  Blood pressure.  Lipid and cholesterol levels. These may be checked every 5 years, or more frequently if you are over 31 years old.  Skin check.  Lung cancer screening. You may have this screening every year starting at age 103 if you have a 30-pack-year history of smoking and currently smoke or have quit within the past 15 years.  Fecal occult blood test (FOBT) of the stool. You may have this test every year starting at age 19.  Flexible sigmoidoscopy or colonoscopy. You may have a sigmoidoscopy every 5 years or a colonoscopy every 10 years starting at age 83.  Prostate cancer screening. Recommendations will vary depending on your family history and other risks.  Hepatitis C blood test.  Hepatitis B blood test.  Sexually transmitted disease (STD) testing.  Diabetes screening. This is done by checking your blood sugar (glucose) after you have not eaten for a while (fasting). You may have this done every 1-3 years.  Abdominal aortic aneurysm (AAA) screening. You may need this if you are a current or former smoker.  Osteoporosis. You may be screened starting at age  70 if you are at high risk. Talk with your health care provider about your test results, treatment options, and if necessary, the need for more tests. Vaccines  Your health care provider may recommend certain vaccines, such  as:  Influenza vaccine. This is recommended every year.  Tetanus, diphtheria, and acellular pertussis (Tdap, Td) vaccine. You may need a Td booster every 10 years.  Zoster vaccine. You may need this after age 84.  Pneumococcal 13-valent conjugate (PCV13) vaccine. One dose is recommended after age 76.  Pneumococcal polysaccharide (PPSV23) vaccine. One dose is recommended after age 35. Talk to your health care provider about which screenings and vaccines you need and how often you need them. This information is not intended to replace advice given to you by your health care provider. Make sure you discuss any questions you have with your health care provider. Document Released: 01/27/2015 Document Revised: 09/20/2015 Document Reviewed: 11/01/2014 Elsevier Interactive Patient Education  2017 [place] Prevention in the Home Falls can cause injuries. They can happen to people of all ages. There are many things you can do to make your home safe and to help prevent falls. What can I do on the outside of my home?  Regularly fix the edges of walkways and driveways and fix any cracks.  Remove anything that might make you trip as you walk through a door, such as a raised step or threshold.  Trim any bushes or trees on the path to your home.  Use bright outdoor lighting.  Clear any walking paths of anything that might make someone trip, such as rocks or tools.  Regularly check to see if handrails are loose or broken. Make sure that both sides of any steps have handrails.  Any raised decks and porches should have guardrails on the edges.  Have any leaves, snow, or ice cleared regularly.  Use sand or salt on walking paths during winter.  Clean up any spills in your garage right away. This includes oil or grease spills. What can I do in the bathroom?  Use night lights.  Install grab bars by the toilet and in the tub and shower. Do not use towel bars as grab bars.  Use  non-skid mats or decals in the tub or shower.  If you need to sit down in the shower, use a plastic, non-slip stool.  Keep the floor dry. Clean up any water that spills on the floor as soon as it happens.  Remove soap buildup in the tub or shower regularly.  Attach bath mats securely with double-sided non-slip rug tape.  Do not have throw rugs and other things on the floor that can make you trip. What can I do in the bedroom?  Use night lights.  Make sure that you have a light by your bed that is easy to reach.  Do not use any sheets or blankets that are too big for your bed. They should not hang down onto the floor.  Have a firm chair that has side arms. You can use this for support while you get dressed.  Do not have throw rugs and other things on the floor that can make you trip. What can I do in the kitchen?  Clean up any spills right away.  Avoid walking on wet floors.  Keep items that you use a lot in easy-to-reach places.  If you need to reach something above you, use a strong step stool that has a grab bar.  Keep  electrical cords out of the way.  Do not use floor polish or wax that makes floors slippery. If you must use wax, use non-skid floor wax.  Do not have throw rugs and other things on the floor that can make you trip. What can I do with my stairs?  Do not leave any items on the stairs.  Make sure that there are handrails on both sides of the stairs and use them. Fix handrails that are broken or loose. Make sure that handrails are as long as the stairways.  Check any carpeting to make sure that it is firmly attached to the stairs. Fix any carpet that is loose or worn.  Avoid having throw rugs at the top or bottom of the stairs. If you do have throw rugs, attach them to the floor with carpet tape.  Make sure that you have a light switch at the top of the stairs and the bottom of the stairs. If you do not have them, ask someone to add them for you. What  else can I do to help prevent falls?  Wear shoes that:  Do not have high heels.  Have rubber bottoms.  Are comfortable and fit you well.  Are closed at the toe. Do not wear sandals.  If you use a stepladder:  Make sure that it is fully opened. Do not climb a closed stepladder.  Make sure that both sides of the stepladder are locked into place.  Ask someone to hold it for you, if possible.  Clearly mark and make sure that you can see:  Any grab bars or handrails.  First and last steps.  Where the edge of each step is.  Use tools that help you move around (mobility aids) if they are needed. These include:  Canes.  Walkers.  Scooters.  Crutches.  Turn on the lights when you go into a dark area. Replace any light bulbs as soon as they burn out.  Set up your furniture so you have a clear path. Avoid moving your furniture around.  If any of your floors are uneven, fix them.  If there are any pets around you, be aware of where they are.  Review your medicines with your doctor. Some medicines can make you feel dizzy. This can increase your chance of falling. Ask your doctor what other things that you can do to help prevent falls. This information is not intended to replace advice given to you by your health care provider. Make sure you discuss any questions you have with your health care provider. Document Released: 10/27/2008 Document Revised: 06/08/2015 Document Reviewed: 02/04/2014 Elsevier Interactive Patient Education  2017 [employer].

## 2020-06-17 ENCOUNTER — Encounter: Payer: Self-pay | Admitting: Family Medicine

## 2020-06-17 NOTE — Progress Notes (Signed)
Patient ID: Albert Giles, male    DOB: 10/24/1945, 75 y.o.   MRN: [id number]  This visit was conducted in person.  BP 126/80   Pulse 67   Temp 97.8 F (36.6 C) (Temporal)   Ht 5\' 8"  (1.727 m)   Wt 169 lb 2 oz (76.7 kg)   SpO2 97%   BMI 25.72 kg/m    CC: CPE Subjective:   HPI: Albert Giles is a 75 y.o. male presenting on 06/21/2020 for Annual Exam (Prt 2. )   Requests wife [phone] be called to schedule upcoming GI eval.   Saw health advisor last week for medicare wellness visit. Note reviewed.   No exam data present  Flowsheet Row Clinical Support from 06/16/2020 in [facility]  PHQ-2 Total Score 0      Fall Risk  06/16/2020 04/15/2019 01/27/2018 01/17/2017 01/01/2016  Falls in the past year? 0 0 0 No No  Number falls in past yr: 0 0 - - -  Injury with Fall? 0 0 - - -  Risk for fall due to : Medication side effect Medication side effect - - -  Follow up Falls evaluation completed;Falls prevention discussed Falls evaluation completed;Falls prevention discussed - - -   Meds prescribed by Dr Ubaldo Glassing whom he sees regularly.   Positive iFOB last year associated with iron deficiency anemia which has progressed this year. Referred to GI last year who recommended colonoscopy/EGD and capsule endoscopy if normal, initial procedures scheduled for 08/2019 - pt cancelled/declined at that time and has not completed yet. He also didn't complete celiac screen or H pylori breath test ordered by GI last year. Notes lightheadedness with bending over, no dyspnea, syncope, palpitations, anorexia. He continues oral iron every other day.   Preventative: Colon cancer screening -fmhx colon cancer - previously declined colonoscopy. iFOB positive last year - see above.  Prostate cancer screening -known BPH - last check 03/2015, stable. rec against continued screening by urology. Nocturia x1. Last year tapered off flomax - stable period off this.  Lung cancer screening - not  eligible. CXR stable 2016 Flushot yearly COVID vaccine Modernal 05/2019, 06/2019, booster x2 12/2019, 05/2020 Prevnar 2015, pneumovax2017  Td 2009  Shingrix - discussed, he's had shingles.  Advanced directives - doesn't have this set up. Would want wife then 2 daughters to be HCPOA.Would not want prolonged life support if terminal condition.Has packet at home - encouraged hereview with family.  Seat belt use discussed Sunscreen use discussed. No changing moles on skin.  Ex smoker35 PY hx quit 2000. Alcohol - none  Dentist - q6 mo  Eye exam - due  Bowel - no constipation Bladder - no incontinence  Caffeine: 2 cups coffee, 2 cups tea/day Lives with wife Occupation: Higher education careers adviser, retired Edu: HS Activity: works in yard, [employer] mi/day Diet: some water, fruits/vegetables daily      Relevant past medical, surgical, family and social history reviewed and updated as indicated. Interim medical history since our last visit reviewed. Allergies and medications reviewed and updated. Outpatient Medications Prior to Visit  Medication Sig Dispense Refill  . aspirin EC 81 MG tablet Take 1 tablet (81 mg total) by mouth daily. 60 tablet 1  . atorvastatin (LIPITOR) 40 MG tablet Take 1 tablet (40 mg total) by mouth daily at 6 PM. 60 tablet 1  . Cholecalciferol (VITAMIN D3) 25 MCG (1000 UT) CAPS Take 1 capsule (1,000 Units total) by mouth daily. 30 capsule   .  clopidogrel (PLAVIX) 75 MG tablet Take 1 tablet (75 mg total) by mouth daily. 60 tablet 1  . Cyanocobalamin (B-12) 1000 MCG SUBL Place 1 tablet under the tongue daily.    . metoprolol tartrate (LOPRESSOR) 25 MG tablet Take 1 tablet (25 mg total) by mouth 2 (two) times daily. 60 tablet 1  . tadalafil (CIALIS) 20 MG tablet TAKE ONE TABLET BY MOUTH DAILY AS NEEDED FOR ERECTILE DYSFUNCTION 20 tablet 3  . cyanocobalamin (,VITAMIN B-12,) 1000 MCG/ML injection Inject 1 mL (1,000 mcg total) into the muscle every 30 (thirty) days.    .  ferrous sulfate 324 (65 Fe) MG TBEC Take 1 tablet (325 mg total) by mouth every other day. 90 tablet 0  . tamsulosin (FLOMAX) 0.4 MG CAPS capsule Take 1 capsule (0.4 mg total) by mouth daily. 90 capsule 1   No facility-administered medications prior to visit.     Per HPI unless specifically indicated in ROS section below Review of Systems  Constitutional: Negative for activity change, appetite change, chills, fatigue, fever and unexpected weight change.  HENT: Negative for hearing loss.   Eyes: Negative for visual disturbance.  Respiratory: Negative for cough, chest tightness, shortness of breath and wheezing.   Cardiovascular: Negative for chest pain, palpitations and leg swelling.  Gastrointestinal: Negative for abdominal distention, abdominal pain, blood in stool, constipation, diarrhea, nausea and vomiting.  Genitourinary: Negative for difficulty urinating and hematuria.  Musculoskeletal: Negative for arthralgias, myalgias and neck pain.  Skin: Negative for rash.  Neurological: Negative for dizziness, seizures, syncope and headaches.  Hematological: Negative for adenopathy. Does not bruise/bleed easily.  Psychiatric/Behavioral: Negative for dysphoric mood. The patient is not nervous/anxious.    Objective:  BP 126/80   Pulse 67   Temp 97.8 F (36.6 C) (Temporal)   Ht 5\' 8"  (1.727 m)   Wt 169 lb 2 oz (76.7 kg)   SpO2 97%   BMI 25.72 kg/m   Wt Readings from Last 3 Encounters:  06/21/20 169 lb 2 oz (76.7 kg)  07/01/19 173 lb (78.5 kg)  06/22/19 175 lb 12.8 oz (79.7 kg)      Physical Exam Vitals and nursing note reviewed.  Constitutional:      General: He is not in acute distress.    Appearance: Normal appearance. He is well-developed. He is not ill-appearing.  HENT:     Head: Normocephalic and atraumatic.     Right Ear: Hearing, tympanic membrane, ear canal and external ear normal.     Left Ear: Hearing, tympanic membrane, ear canal and external ear normal.  Eyes:      General: No scleral icterus.    Conjunctiva/sclera: Conjunctivae normal.     Pupils: Pupils are equal, round, and reactive to light.  Neck:     Thyroid: No thyroid mass or thyromegaly.     Vascular: No carotid bruit.  Cardiovascular:     Rate and Rhythm: Normal rate and regular rhythm.     Pulses: Normal pulses.          Radial pulses are 2+ on the right side and 2+ on the left side.     Heart sounds: Normal heart sounds. No murmur heard.   Pulmonary:     Effort: Pulmonary effort is normal. No respiratory distress.     Breath sounds: Normal breath sounds. No wheezing, rhonchi or rales.  Abdominal:     General: Bowel sounds are normal. There is no distension.     Palpations: Abdomen is soft. There is no  mass.     Tenderness: There is no abdominal tenderness. There is no guarding or rebound.     Hernia: No hernia is present.  Musculoskeletal:        General: Normal range of motion.     Cervical back: Normal range of motion and neck supple.     Right lower leg: No edema.     Left lower leg: No edema.  Lymphadenopathy:     Cervical: No cervical adenopathy.  Skin:    General: Skin is warm and dry.     Findings: No rash.  Neurological:     General: No focal deficit present.     Mental Status: He is alert and oriented to person, place, and time.     Comments: CN grossly intact, station and gait intact  Psychiatric:        Mood and Affect: Mood normal.        Behavior: Behavior normal.        Thought Content: Thought content normal.        Judgment: Judgment normal.       Results for orders placed or performed in visit on 06/14/20  Microalbumin / creatinine urine ratio  Result Value Ref Range   Microalb, Ur 1.0 0.0 - 1.9 mg/dL   Creatinine,U 232.1 mg/dL   Microalb Creat Ratio 0.4 0.0 - 30.0 mg/g  CBC with Differential/Platelet  Result Value Ref Range   WBC 9.1 4.0 - 10.5 K/uL   RBC 4.11 (L) 4.22 - 5.81 Mil/uL   Hemoglobin 9.7 (L) 13.0 - 17.0 g/dL   HCT 31.6 (L) 39.0 -  52.0 %   MCV 76.9 (L) 78.0 - 100.0 fl   MCHC 30.6 30.0 - 36.0 g/dL   RDW 17.8 (H) 11.5 - 15.5 %   Platelets 313.0 150.0 - 400.0 K/uL   Neutrophils Relative % 62.3 43.0 - 77.0 %   Lymphocytes Relative 22.4 12.0 - 46.0 %   Monocytes Relative 9.4 3.0 - 12.0 %   Eosinophils Relative 4.5 0.0 - 5.0 %   Basophils Relative 1.4 0.0 - 3.0 %   Neutro Abs 5.7 1.4 - 7.7 K/uL   Lymphs Abs 2.0 0.7 - 4.0 K/uL   Monocytes Absolute 0.9 0.1 - 1.0 K/uL   Eosinophils Absolute 0.4 0.0 - 0.7 K/uL   Basophils Absolute 0.1 0.0 - 0.1 K/uL  PSA  Result Value Ref Range   PSA 2.64 0.10 - 4.00 ng/mL  Comprehensive metabolic panel  Result Value Ref Range   Sodium 139 135 - 145 mEq/L   Potassium 4.2 3.5 - 5.1 mEq/L   Chloride 106 96 - 112 mEq/L   CO2 24 19 - 32 mEq/L   Glucose, Bld 101 (H) 70 - 99 mg/dL   BUN 16 6 - 23 mg/dL   Creatinine, Ser 1.77 (H) 0.40 - 1.50 mg/dL   Total Bilirubin 0.4 0.2 - 1.2 mg/dL   Alkaline Phosphatase 114 39 - 117 U/L   AST 11 0 - 37 U/L   ALT 8 0 - 53 U/L   Total Protein 6.5 6.0 - 8.3 g/dL   Albumin 4.1 3.5 - 5.2 g/dL   GFR 36.75 (L) >60.00 mL/min   Calcium 9.5 8.4 - 10.5 mg/dL  Lipid panel  Result Value Ref Range   Cholesterol 106 0 - 200 mg/dL   Triglycerides 118.0 0.0 - 149.0 mg/dL   HDL 39.50 >39.00 mg/dL   VLDL 23.6 0.0 - 40.0 mg/dL   LDL Cholesterol 43 0 -  99 mg/dL   Total CHOL/HDL Ratio 3    NonHDL 66.68   Folate  Result Value Ref Range   Folate 10.4 >5.9 ng/mL  VITAMIN D 25 Hydroxy (Vit-D Deficiency, Fractures)  Result Value Ref Range   VITD 28.02 (L) 30.00 - 100.00 ng/mL  IBC panel  Result Value Ref Range   Iron 11 (L) 42 - 165 ug/dL   Transferrin 357.0 212.0 - 360.0 mg/dL   Saturation Ratios 2.2 (L) 20.0 - 50.0 %  Ferritin  Result Value Ref Range   Ferritin 8.5 (L) 22.0 - 322.0 ng/mL  Vitamin B12  Result Value Ref Range   Vitamin B-12 333 211 - 911 pg/mL   Assessment & Plan:  This visit occurred during the SARS-CoV-2 public health emergency.   Safety protocols were in place, including screening questions prior to the visit, additional usage of staff PPE, and extensive cleaning of exam room while observing appropriate contact time as indicated for disinfecting solutions.   Problem List Items Addressed This Visit    Family history of colon cancer   Health maintenance examination - Primary    Preventative protocols reviewed and updated unless pt declined. Discussed healthy diet and lifestyle.       Advanced care planning/counseling discussion    Advanced directives - doesn't have this set up. Would want wife then 2 daughters to be HCPOA.Would not want prolonged life support if terminal condition.Has packet at home - encouraged hereview with family.       CAD (coronary artery disease)    Followed closely by Dr Ubaldo Glassing      CKD (chronic kidney disease) stage 3, GFR 30-59 ml/min (HCC)    Progressive - encouraged good hydration status. RTC 6 mo CKD f/u visit - will check labs at that time.       BPH with obstruction/lower urinary tract symptoms    Overall stable period off flomax.  Followed by urology - upcoming appt at end of month.       Hematuria    Followed by urology - most recent UA normal (2021)      Hyperlipidemia    Chronic, stable on statin - continue. The ASCVD Risk score Mikey Bussing DC Jr., et al., 2013) failed to calculate for the following reasons:   The patient has a prior MI or stroke diagnosis       CAD S/P percutaneous coronary angioplasty   Vitamin B12 deficiency    Levels improving on daily oral replacement - declines B12 injection today.       Iron deficiency anemia    Progression noted today despite QOD oral iron. Needs luminal evaluation r/o colon cancer (in fmhx same), pt cancelled last year's colonoscopy. Discussed this in detail, emphasized need for colonoscopy, reviewed risks of missed colon cancer if this is not done. I have placed new urgent referral to [facility] to have my referral coordinators  help expedite appt, provided # to patient to call and schedule appointment as well.       Relevant Medications   ferrous sulfate 324 (65 Fe) MG TBEC   Other Relevant Orders   Ambulatory referral to Gastroenterology   Vitamin D deficiency    Improving on daily oral replacement.      Positive occult stool blood test   Relevant Orders   Ambulatory referral to Gastroenterology       Meds ordered this encounter  Medications  . ferrous sulfate 324 (65 Fe) MG TBEC    Sig: Take 1 tablet (  325 mg total) by mouth every other day.   Orders Placed This Encounter  Procedures  . Ambulatory referral to Gastroenterology    Referral Priority:   Urgent    Referral Type:   Consultation    Referral Reason:   Specialty Services Required    Number of Visits Requested:   1    Patient instructions: You need to schedule colonoscopy - I will refer you back to Dr Georgeann Oppenheim office. His office # is [phone] - call to schedule appointment if you don't hear from us/them.  If interested, check with pharmacy about new 2 shot shingles series (shingrix).  You also do have chronic kidney disease - we will recheck this at 6 month visit. Ensure you are drinking plenty of water.  Advanced directive packet provided today  Return in 6 months for follow up viist, sooner if needed   Follow up plan: Return in about 6 months (around 12/21/2020) for follow up visit.  [name], MD

## 2020-06-21 ENCOUNTER — Encounter: Payer: Self-pay | Admitting: Family Medicine

## 2020-06-21 ENCOUNTER — Ambulatory Visit (INDEPENDENT_AMBULATORY_CARE_PROVIDER_SITE_OTHER): Payer: Medicare HMO | Admitting: Family Medicine

## 2020-06-21 ENCOUNTER — Other Ambulatory Visit: Payer: Self-pay

## 2020-06-21 VITALS — BP 126/80 | HR 67 | Temp 97.8°F | Ht 68.0 in | Wt 169.1 lb

## 2020-06-21 DIAGNOSIS — N1832 Chronic kidney disease, stage 3b: Secondary | ICD-10-CM

## 2020-06-21 DIAGNOSIS — Z7189 Other specified counseling: Secondary | ICD-10-CM

## 2020-06-21 DIAGNOSIS — I2510 Atherosclerotic heart disease of native coronary artery without angina pectoris: Secondary | ICD-10-CM | POA: Diagnosis not present

## 2020-06-21 DIAGNOSIS — E785 Hyperlipidemia, unspecified: Secondary | ICD-10-CM | POA: Diagnosis not present

## 2020-06-21 DIAGNOSIS — Z0000 Encounter for general adult medical examination without abnormal findings: Secondary | ICD-10-CM | POA: Diagnosis not present

## 2020-06-21 DIAGNOSIS — R195 Other fecal abnormalities: Secondary | ICD-10-CM | POA: Diagnosis not present

## 2020-06-21 DIAGNOSIS — N401 Enlarged prostate with lower urinary tract symptoms: Secondary | ICD-10-CM

## 2020-06-21 DIAGNOSIS — Z9861 Coronary angioplasty status: Secondary | ICD-10-CM

## 2020-06-21 DIAGNOSIS — E559 Vitamin D deficiency, unspecified: Secondary | ICD-10-CM

## 2020-06-21 DIAGNOSIS — Z800 Family history of malignant neoplasm of digestive organs: Secondary | ICD-10-CM

## 2020-06-21 DIAGNOSIS — R319 Hematuria, unspecified: Secondary | ICD-10-CM

## 2020-06-21 DIAGNOSIS — D509 Iron deficiency anemia, unspecified: Secondary | ICD-10-CM | POA: Diagnosis not present

## 2020-06-21 DIAGNOSIS — N138 Other obstructive and reflux uropathy: Secondary | ICD-10-CM

## 2020-06-21 DIAGNOSIS — E538 Deficiency of other specified B group vitamins: Secondary | ICD-10-CM

## 2020-06-21 MED ORDER — FERROUS SULFATE 324 (65 FE) MG PO TBEC: 1.0000 | DELAYED_RELEASE_TABLET | ORAL | Status: AC

## 2020-06-21 NOTE — Assessment & Plan Note (Signed)
Preventative protocols reviewed and updated unless pt declined. Discussed healthy diet and lifestyle.  

## 2020-06-21 NOTE — Assessment & Plan Note (Signed)
Followed closely by Dr Ubaldo Glassing

## 2020-06-21 NOTE — Assessment & Plan Note (Addendum)
Progression noted today despite QOD oral iron. Needs luminal evaluation r/o colon cancer (in fmhx same), pt cancelled last year's colonoscopy. Discussed this in detail, emphasized need for colonoscopy, reviewed risks of missed colon cancer if this is not done. I have placed new urgent referral to  GI to have my referral coordinators help expedite appt, provided # to patient to call and schedule appointment as well.

## 2020-06-21 NOTE — Patient Instructions (Addendum)
You need to schedule colonoscopy - I will refer you back to Dr Georgeann Oppenheim office. His office # is [phone] - call to schedule appointment if you don't hear from us/them.  If interested, check with pharmacy about new 2 shot shingles series (shingrix).  You also do have chronic kidney disease - we will recheck this at 6 month visit. Ensure you are drinking plenty of water.  Advanced directive packet provided today  Return in 6 months for follow up viist, sooner if needed   Health Maintenance After Age 45 After age 22, you are at a higher risk for certain long-term diseases and infections as well as injuries from falls. Falls are a major cause of broken bones and head injuries in people who are older than age 62. Getting regular preventive care can help to keep you healthy and well. Preventive care includes getting regular testing and making lifestyle changes as recommended by your health care provider. Talk with your health care provider about:  Which screenings and tests you should have. A screening is a test that checks for a disease when you have no symptoms.  A diet and exercise plan that is right for you. What should I know about screenings and tests to prevent falls? Screening and testing are the best ways to find a health problem early. Early diagnosis and treatment give you the best chance of managing medical conditions that are common after age 99. Certain conditions and lifestyle choices may make you more likely to have a fall. Your health care provider may recommend:  Regular vision checks. Poor vision and conditions such as cataracts can make you more likely to have a fall. If you wear glasses, make sure to get your prescription updated if your vision changes.  Medicine review. Work with your health care provider to regularly review all of the medicines you are taking, including over-the-counter medicines. Ask your health care provider about any side effects that may make you more likely  to have a fall. Tell your health care provider if any medicines that you take make you feel dizzy or sleepy.  Osteoporosis screening. Osteoporosis is a condition that causes the bones to get weaker. This can make the bones weak and cause them to break more easily.  Blood pressure screening. Blood pressure changes and medicines to control blood pressure can make you feel dizzy.  Strength and balance checks. Your health care provider may recommend certain tests to check your strength and balance while standing, walking, or changing positions.  Foot health exam. Foot pain and numbness, as well as not wearing proper footwear, can make you more likely to have a fall.  Depression screening. You may be more likely to have a fall if you have a fear of falling, feel emotionally low, or feel unable to do activities that you used to do.  Alcohol use screening. Using too much alcohol can affect your balance and may make you more likely to have a fall. What actions can I take to lower my risk of falls? General instructions  Talk with your health care provider about your risks for falling. Tell your health care provider if: ? You fall. Be sure to tell your health care provider about all falls, even ones that seem minor. ? You feel dizzy, sleepy, or off-balance.  Take over-the-counter and prescription medicines only as told by your health care provider. These include any supplements.  Eat a healthy diet and maintain a healthy weight. A healthy diet includes low-fat  dairy products, low-fat (lean) meats, and fiber from whole grains, beans, and lots of fruits and vegetables. Home safety  Remove any tripping hazards, such as rugs, cords, and clutter.  Install safety equipment such as grab bars in bathrooms and safety rails on stairs.  Keep rooms and walkways well-lit. Activity  Follow a regular exercise program to stay fit. This will help you maintain your balance. Ask your health care provider what  types of exercise are appropriate for you.  If you need a cane or walker, use it as recommended by your health care provider.  Wear supportive shoes that have nonskid soles.   Lifestyle  Do not drink alcohol if your health care provider tells you not to drink.  If you drink alcohol, limit how much you have: ? 0-1 drink a day for women. ? 0-2 drinks a day for men.  Be aware of how much alcohol is in your drink. In the [place] one drink equals one typical bottle of beer (12 oz), one-half glass of wine (5 oz), or one shot of hard liquor (1 oz).  Do not use any products that contain nicotine or tobacco, such as cigarettes and e-cigarettes. If you need help quitting, ask your health care provider. Summary  Having a healthy lifestyle and getting preventive care can help to protect your health and wellness after age 19.  Screening and testing are the best way to find a health problem early and help you avoid having a fall. Early diagnosis and treatment give you the best chance for managing medical conditions that are more common for people who are older than age 69.  Falls are a major cause of broken bones and head injuries in people who are older than age 9. Take precautions to prevent a fall at home.  Work with your health care provider to learn what changes you can make to improve your health and wellness and to prevent falls. This information is not intended to replace advice given to you by your health care provider. Make sure you discuss any questions you have with your health care provider. Document Revised: 04/23/2018 Document Reviewed: 11/13/2016 Elsevier Patient Education  2021 [employer].

## 2020-06-21 NOTE — Assessment & Plan Note (Signed)
Overall stable period off flomax.  Followed by urology - upcoming appt at end of month.

## 2020-06-21 NOTE — Assessment & Plan Note (Signed)
Levels improving on daily oral replacement - declines B12 injection today.

## 2020-06-21 NOTE — Assessment & Plan Note (Signed)
Improving on daily oral replacement.

## 2020-06-21 NOTE — Assessment & Plan Note (Signed)
Progressive - encouraged good hydration status. RTC 6 mo CKD f/u visit - will check labs at that time.

## 2020-06-21 NOTE — Assessment & Plan Note (Signed)
Advanced directives - doesn't have this set up. Would want wife then 2 daughters to be HCPOA.Would not want prolonged life support if terminal condition.Has packet at home - encouraged hereview with family.

## 2020-06-21 NOTE — Assessment & Plan Note (Signed)
Chronic, stable on statin - continue. The ASCVD Risk score Mikey Bussing DC Jr., et al., 2013) failed to calculate for the following reasons:   The patient has a prior MI or stroke diagnosis

## 2020-06-21 NOTE — Assessment & Plan Note (Addendum)
Followed by urology - most recent UA normal (2021)

## 2020-06-22 ENCOUNTER — Encounter: Payer: Self-pay | Admitting: *Deleted

## 2020-07-04 NOTE — Progress Notes (Signed)
07/05/2020 1:42 PM   Albert Giles [birth year] [id number]  Referring provider: [name], MD [address] [place],  [place] [address]  Urological history: 1. High risk hematuria -former smoker -Eval 2017 with CTU and cysto with bilobar prostate hypertrophy only -Eval 2019 with RUS, MRI and cysto with right renal cyst and biateral lobe enlargement prostate; prominent hypervascularity -UA negative for micro heme  2. BPH with LU TS -PSA 2.64 in 06/2020 -I PSS 3/2  3. ED -contributing factors of age, BPH, HTN, former smoker and CAD -SHIM 46 -managed with tadalafil 20 mg, on-demand-dosing    Chief Complaint  Patient presents with   Hematuria   Follow-up     HPI: 75 yo male with a history of hematuria and BPH with LU TS who presents today for a one year follow up.  He has nocturia x 1.  Patient denies any modifying or aggravating factors.  Patient denies any gross hematuria, dysuria or suprapubic/flank pain.  Patient denies any fevers, chills, nausea or vomiting.     IPSS     Row Name 07/05/20 1300         International Prostate Symptom Score   How often have you had the sensation of not emptying your bladder? Less than 1 in 5     How often have you had to urinate less than every two hours? Not at All     How often have you found you stopped and started again several times when you urinated? Not at All     How often have you found it difficult to postpone urination? Not at All     How often have you had a weak urinary stream? Not at All     How often have you had to strain to start urination? Not at All     How many times did you typically get up at night to urinate? 2 Times     Total IPSS Score 3           Quality of Life due to urinary symptoms     If you were to spend the rest of your life with your urinary condition just the way it is now how would you feel about that? Mostly Satisfied              Score:  1-7 Mild 8-19 Moderate 20-35  Severe   Patient still having spontaneous erections.   He denies any pain or curvature with erections.    SHIM     Row Name 07/05/20 1323         SHIM: Over the last 6 months:   How do you rate your confidence that you could get and keep an erection? High     When you had erections with sexual stimulation, how often were your erections hard enough for penetration (entering your partner)? Almost Always or Always     During sexual intercourse, how often were you able to maintain your erection after you had penetrated (entered) your partner? Most Times (much more than half the time)     During sexual intercourse, how difficult was it to maintain your erection to completion of intercourse? Not Difficult     When you attempted sexual intercourse, how often was it satisfactory for you? Almost Always or Always           SHIM Total Score     SHIM 23  Score: 1-7 Severe ED 8-11 Moderate ED 12-16 Mild-Moderate ED 17-21 Mild ED 22-25 No ED   PMH: Past Medical History:  Diagnosis Date   CAD (coronary artery disease) 06/2014   UA with NSTEMI - cath with 99% prox L circ s/p stent, EF 40% ([place], [place] [facility])   Emphysema    mild   Ex-smoker    NSTEMI (non-ST elevated myocardial infarction) ([place]) 07/13/2014    Surgical History: Past Surgical History:  Procedure Laterality Date   CARDIAC CATHETERIZATION N/A 07/14/2014   Left Heart Cath and Coronary Angiography with stent placement;  Surgeon: Teodoro Spray, MD   CARDIAC CATHETERIZATION N/A 07/14/2014   Coronary Stent Intervention;  Surgeon: Yolonda Kida, MD   CYSTECTOMY     on back   [place] Right 08/17/04    Home Medications:  Allergies as of 07/05/2020   No Known Allergies      Medication List        Accurate as of July 05, 2020  1:42 PM. If you have any questions, ask your nurse or doctor.          aspirin EC 81 MG tablet Take 1 tablet (81 mg total) by mouth daily.   atorvastatin  40 MG tablet Commonly known as: LIPITOR Take 1 tablet (40 mg total) by mouth daily at 6 PM.   B-12 1000 MCG Subl Place 1 tablet under the tongue daily.   clopidogrel 75 MG tablet Commonly known as: PLAVIX Take 1 tablet (75 mg total) by mouth daily.   ferrous sulfate 324 (65 Fe) MG Tbec Take 1 tablet (325 mg total) by mouth every other day.   metoprolol tartrate 25 MG tablet Commonly known as: LOPRESSOR Take 1 tablet (25 mg total) by mouth 2 (two) times daily.   tadalafil 20 MG tablet Commonly known as: CIALIS TAKE ONE TABLET BY MOUTH DAILY AS NEEDED FOR ERECTILE DYSFUNCTION   Vitamin D3 25 MCG (1000 UT) Caps Take 1 capsule (1,000 Units total) by mouth daily.        Allergies: No Known Allergies  Family History: Family History  Problem Relation Age of Onset   Cancer Mother 71       colon   Cancer Sister        breast   Crohn's disease Sister    Cancer Daughter        anal   Crohn's disease Niece    CAD Neg Hx    Stroke Neg Hx    Diabetes Neg Hx    Prostate cancer Neg Hx    Kidney cancer Neg Hx    Bladder Cancer Neg Hx     Social History:  reports that he quit smoking about 22 years ago. His smoking use included cigarettes. He has a 35.00 pack-year smoking history. He has never used smokeless tobacco. He reports that he does not drink alcohol and does not use drugs.  ROS: For pertinent review of systems please refer to history of present illness  Physical Exam: BP 137/72   Pulse 68   Ht 5\' 8"  (1.727 m)   Wt 173 lb (78.5 kg)   BMI 26.30 kg/m   Constitutional:  Well nourished. Alert and oriented, No acute distress. HEENT: [place] AT, mask in place.  Trachea midline Cardiovascular: No clubbing, cyanosis, or edema. Respiratory: Normal respiratory effort, no increased work of breathing. GU: No CVA tenderness.  No bladder fullness or masses.  Patient with circumcised phallus.  Urethral meatus is  patent.  No penile discharge. No penile lesions or rashes. Scrotum  without lesions, cysts, rashes and/or edema.  Testicles are located scrotally bilaterally. No masses are appreciated in the testicles. Left and right epididymis are normal. Rectal: Patient with  normal sphincter tone. Anus and perineum without scarring or rashes. No rectal masses are appreciated. Prostate is approximately 50 + grams, could only palpate the apex and the midportion of the gland, no nodules are appreciated. Seminal vesicles could not be palpated Neurologic: Grossly intact, no focal deficits, moving all 4 extremities. Psychiatric: Normal mood and affect.   Laboratory Data: Lab Results  Component Value Date   WBC 9.1 06/14/2020   HGB 9.7 (L) 06/14/2020   HCT 31.6 (L) 06/14/2020   MCV 76.9 (L) 06/14/2020   PLT 313.0 06/14/2020    Lab Results  Component Value Date   CREATININE 1.77 (H) 06/14/2020    Lab Results  Component Value Date   PSA 2.64 06/14/2020   PSA 1.93 05/31/2019   PSA 1.73 01/22/2018   Urinalysis Component     Latest Ref Rng & Units 07/05/2020  Specific Gravity, UA     1.005 - 1.030 1.025  pH, UA     5.0 - 7.5 5.0  Color, UA     Yellow Yellow  Appearance Ur     Clear Clear  Leukocytes,UA     Negative Negative  Protein,UA     Negative/Trace Negative  Glucose, UA     Negative Negative  Ketones, UA     Negative Negative  RBC, UA     Negative Negative  Bilirubin, UA     Negative Negative  Urobilinogen, Ur     0.2 - 1.0 mg/dL 0.2  Nitrite, UA     Negative Negative     I have reviewed the labs.   Assessment & Plan:    1. BPH with LU TS -PSA stable -DRE normal -no bothersome symptoms  2. ED -Continue tadalafil 20 mg daily  3. High risk hematuria -Completed workup in 2017 and 2019 with CTU, RUS, MRI and cystoscopy 2 - right renal cysts - bilobar prostate hypertrophy with hypervascularity  -No reports of gross hematuria -UA today is negative for hematuria    Return in about 1 year (around 07/05/2021) for IPSS, SHIM, PSA, UA and  exam.  Zara Council, [facility]  [address], [place] [address] [phone]

## 2020-07-05 ENCOUNTER — Ambulatory Visit (INDEPENDENT_AMBULATORY_CARE_PROVIDER_SITE_OTHER): Payer: Medicare HMO | Admitting: Urology

## 2020-07-05 ENCOUNTER — Other Ambulatory Visit: Payer: Self-pay

## 2020-07-05 ENCOUNTER — Encounter: Payer: Self-pay | Admitting: Urology

## 2020-07-05 VITALS — BP 137/72 | HR 68 | Ht 68.0 in | Wt 173.0 lb

## 2020-07-05 DIAGNOSIS — N401 Enlarged prostate with lower urinary tract symptoms: Secondary | ICD-10-CM

## 2020-07-05 DIAGNOSIS — N138 Other obstructive and reflux uropathy: Secondary | ICD-10-CM

## 2020-07-05 DIAGNOSIS — N529 Male erectile dysfunction, unspecified: Secondary | ICD-10-CM | POA: Diagnosis not present

## 2020-07-05 DIAGNOSIS — R319 Hematuria, unspecified: Secondary | ICD-10-CM

## 2020-07-06 LAB — URINALYSIS, COMPLETE
Bilirubin, UA: NEGATIVE
Glucose, UA: NEGATIVE
Ketones, UA: NEGATIVE
Leukocytes,UA: NEGATIVE
Nitrite, UA: NEGATIVE
Protein,UA: NEGATIVE
RBC, UA: NEGATIVE
Specific Gravity, UA: 1.025 (ref 1.005–1.030)
Urobilinogen, Ur: 0.2 mg/dL (ref 0.2–1.0)
pH, UA: 5 (ref 5.0–7.5)

## 2020-08-01 DIAGNOSIS — I252 Old myocardial infarction: Secondary | ICD-10-CM | POA: Diagnosis not present

## 2020-08-01 DIAGNOSIS — R030 Elevated blood-pressure reading, without diagnosis of hypertension: Secondary | ICD-10-CM | POA: Diagnosis not present

## 2020-08-01 DIAGNOSIS — Z803 Family history of malignant neoplasm of breast: Secondary | ICD-10-CM | POA: Diagnosis not present

## 2020-08-01 DIAGNOSIS — Z7982 Long term (current) use of aspirin: Secondary | ICD-10-CM | POA: Diagnosis not present

## 2020-08-01 DIAGNOSIS — E785 Hyperlipidemia, unspecified: Secondary | ICD-10-CM | POA: Diagnosis not present

## 2020-08-01 DIAGNOSIS — I2510 Atherosclerotic heart disease of native coronary artery without angina pectoris: Secondary | ICD-10-CM | POA: Diagnosis not present

## 2020-08-01 DIAGNOSIS — N529 Male erectile dysfunction, unspecified: Secondary | ICD-10-CM | POA: Diagnosis not present

## 2020-08-01 DIAGNOSIS — Z87891 Personal history of nicotine dependence: Secondary | ICD-10-CM | POA: Diagnosis not present

## 2020-08-01 DIAGNOSIS — Z7902 Long term (current) use of antithrombotics/antiplatelets: Secondary | ICD-10-CM | POA: Diagnosis not present

## 2020-08-01 DIAGNOSIS — Z85828 Personal history of other malignant neoplasm of skin: Secondary | ICD-10-CM | POA: Diagnosis not present

## 2020-08-15 ENCOUNTER — Telehealth (INDEPENDENT_AMBULATORY_CARE_PROVIDER_SITE_OTHER): Payer: Self-pay | Admitting: Gastroenterology

## 2020-08-15 DIAGNOSIS — Z1211 Encounter for screening for malignant neoplasm of colon: Secondary | ICD-10-CM

## 2020-08-15 DIAGNOSIS — R195 Other fecal abnormalities: Secondary | ICD-10-CM

## 2020-08-15 MED ORDER — PEG 3350-KCL-NA BICARB-NACL 420 G PO SOLR: 4000.0000 mL | Freq: Once | ORAL | 0 refills | Status: AC

## 2020-08-15 NOTE — Progress Notes (Signed)
Gastroenterology Pre-Procedure Review  Request Date: 09/06/20 Requesting Physician: Dr. Marius Ditch  PATIENT REVIEW QUESTIONS: The patient responded to the following health history questions as indicated:    1. Are you having any GI issues? no 2. Do you have a personal history of Polyps?  Positive Cologuard Test 3. Do you have a family history of Colon Cancer or Polyps? yes (Mother colon cancer) 4. Diabetes Mellitus? no 5. Joint replacements in the past 12 months?no 6. Major health problems in the past 3 months?no 7. Any artificial heart valves, MVP, or defibrillator?no    MEDICATIONS & ALLERGIES:    Patient reports the following regarding taking any anticoagulation/antiplatelet therapy:   Plavix, Coumadin, Eliquis, Xarelto, Lovenox, Pradaxa, Brilinta, or Effient? yes (Plavix 75 mg) Aspirin? yes (81 mg)  Patient confirms/reports the following medications:  Current Outpatient Medications  Medication Sig Dispense Refill   aspirin EC 81 MG tablet Take 1 tablet (81 mg total) by mouth daily. 60 tablet 1   atorvastatin (LIPITOR) 40 MG tablet Take 1 tablet (40 mg total) by mouth daily at 6 PM. 60 tablet 1   Cholecalciferol (VITAMIN D3) 25 MCG (1000 UT) CAPS Take 1 capsule (1,000 Units total) by mouth daily. 30 capsule    clopidogrel (PLAVIX) 75 MG tablet Take 1 tablet (75 mg total) by mouth daily. 60 tablet 1   Cyanocobalamin (B-12) 1000 MCG SUBL Place 1 tablet under the tongue daily.     ferrous sulfate 324 (65 Fe) MG TBEC Take 1 tablet (325 mg total) by mouth every other day.     metoprolol tartrate (LOPRESSOR) 25 MG tablet Take 1 tablet (25 mg total) by mouth 2 (two) times daily. 60 tablet 1   tadalafil (CIALIS) 20 MG tablet TAKE ONE TABLET BY MOUTH DAILY AS NEEDED FOR ERECTILE DYSFUNCTION 20 tablet 3   No current facility-administered medications for this visit.    Patient confirms/reports the following allergies:  No Known Allergies  No orders of the defined types were placed in this  encounter.   AUTHORIZATION INFORMATION Primary Insurance: 1D#: Group #:  Secondary Insurance: 1D#: Group #:  SCHEDULE INFORMATION: Date: 09/06/20 Time: Location: [place]

## 2020-08-28 ENCOUNTER — Telehealth: Payer: Self-pay

## 2020-08-28 NOTE — Telephone Encounter (Signed)
Called to inform patient of blood thinner clearance. Per Dr. Ubaldo Glassing: patient can stop Plavix 5 days before procedure and restart ASAP. Pt verbalized understanding.

## 2020-09-04 DIAGNOSIS — Z9861 Coronary angioplasty status: Secondary | ICD-10-CM | POA: Diagnosis not present

## 2020-09-04 DIAGNOSIS — E785 Hyperlipidemia, unspecified: Secondary | ICD-10-CM | POA: Diagnosis not present

## 2020-09-04 DIAGNOSIS — I2510 Atherosclerotic heart disease of native coronary artery without angina pectoris: Secondary | ICD-10-CM | POA: Diagnosis not present

## 2020-09-04 DIAGNOSIS — I214 Non-ST elevation (NSTEMI) myocardial infarction: Secondary | ICD-10-CM | POA: Diagnosis not present

## 2020-09-05 ENCOUNTER — Encounter: Payer: Self-pay | Admitting: Gastroenterology

## 2020-09-06 ENCOUNTER — Ambulatory Visit: Payer: Medicare HMO | Admitting: Anesthesiology

## 2020-09-06 ENCOUNTER — Ambulatory Visit
Admission: RE | Admit: 2020-09-06 | Discharge: 2020-09-06 | Disposition: A | Discharge status: Home / Self Care | Payer: Medicare HMO | Attending: Gastroenterology | Admitting: Gastroenterology

## 2020-09-06 ENCOUNTER — Encounter
Admission: RE | Disposition: A | Discharge status: Home / Self Care | Payer: Self-pay | Source: Home / Self Care | Attending: Gastroenterology

## 2020-09-06 ENCOUNTER — Telehealth: Payer: Self-pay

## 2020-09-06 DIAGNOSIS — K209 Esophagitis, unspecified without bleeding: Secondary | ICD-10-CM | POA: Diagnosis not present

## 2020-09-06 DIAGNOSIS — D509 Iron deficiency anemia, unspecified: Secondary | ICD-10-CM | POA: Insufficient documentation

## 2020-09-06 DIAGNOSIS — C184 Malignant neoplasm of transverse colon: Secondary | ICD-10-CM | POA: Diagnosis not present

## 2020-09-06 DIAGNOSIS — K2100 Gastro-esophageal reflux disease with esophagitis, without bleeding: Secondary | ICD-10-CM | POA: Insufficient documentation

## 2020-09-06 DIAGNOSIS — Z7902 Long term (current) use of antithrombotics/antiplatelets: Secondary | ICD-10-CM | POA: Insufficient documentation

## 2020-09-06 DIAGNOSIS — K449 Diaphragmatic hernia without obstruction or gangrene: Secondary | ICD-10-CM | POA: Insufficient documentation

## 2020-09-06 DIAGNOSIS — Z800 Family history of malignant neoplasm of digestive organs: Secondary | ICD-10-CM | POA: Insufficient documentation

## 2020-09-06 DIAGNOSIS — Z7982 Long term (current) use of aspirin: Secondary | ICD-10-CM | POA: Insufficient documentation

## 2020-09-06 DIAGNOSIS — B9681 Helicobacter pylori [H. pylori] as the cause of diseases classified elsewhere: Secondary | ICD-10-CM | POA: Diagnosis not present

## 2020-09-06 DIAGNOSIS — K5669 Other partial intestinal obstruction: Secondary | ICD-10-CM | POA: Diagnosis not present

## 2020-09-06 DIAGNOSIS — C189 Malignant neoplasm of colon, unspecified: Secondary | ICD-10-CM | POA: Insufficient documentation

## 2020-09-06 DIAGNOSIS — D123 Benign neoplasm of transverse colon: Secondary | ICD-10-CM | POA: Diagnosis not present

## 2020-09-06 DIAGNOSIS — K2950 Unspecified chronic gastritis without bleeding: Secondary | ICD-10-CM | POA: Diagnosis not present

## 2020-09-06 DIAGNOSIS — D500 Iron deficiency anemia secondary to blood loss (chronic): Secondary | ICD-10-CM | POA: Diagnosis not present

## 2020-09-06 DIAGNOSIS — R195 Other fecal abnormalities: Secondary | ICD-10-CM | POA: Insufficient documentation

## 2020-09-06 DIAGNOSIS — K6289 Other specified diseases of anus and rectum: Secondary | ICD-10-CM | POA: Diagnosis not present

## 2020-09-06 DIAGNOSIS — Z1211 Encounter for screening for malignant neoplasm of colon: Secondary | ICD-10-CM | POA: Diagnosis not present

## 2020-09-06 DIAGNOSIS — D490 Neoplasm of unspecified behavior of digestive system: Secondary | ICD-10-CM | POA: Diagnosis not present

## 2020-09-06 DIAGNOSIS — Z87891 Personal history of nicotine dependence: Secondary | ICD-10-CM | POA: Insufficient documentation

## 2020-09-06 DIAGNOSIS — C19 Malignant neoplasm of rectosigmoid junction: Secondary | ICD-10-CM

## 2020-09-06 DIAGNOSIS — Z79899 Other long term (current) drug therapy: Secondary | ICD-10-CM | POA: Insufficient documentation

## 2020-09-06 DIAGNOSIS — K635 Polyp of colon: Secondary | ICD-10-CM

## 2020-09-06 DIAGNOSIS — C20 Malignant neoplasm of rectum: Secondary | ICD-10-CM | POA: Diagnosis not present

## 2020-09-06 DIAGNOSIS — E785 Hyperlipidemia, unspecified: Secondary | ICD-10-CM | POA: Diagnosis not present

## 2020-09-06 DIAGNOSIS — K3189 Other diseases of stomach and duodenum: Secondary | ICD-10-CM | POA: Diagnosis not present

## 2020-09-06 DIAGNOSIS — C186 Malignant neoplasm of descending colon: Secondary | ICD-10-CM | POA: Diagnosis not present

## 2020-09-06 HISTORY — PX: COLONOSCOPY WITH PROPOFOL: SHX5780

## 2020-09-06 HISTORY — PX: ESOPHAGOGASTRODUODENOSCOPY: SHX5428

## 2020-09-06 SURGERY — COLONOSCOPY WITH PROPOFOL
Anesthesia: General

## 2020-09-06 MED ORDER — ONDANSETRON HCL 4 MG/2ML IJ SOLN
INTRAMUSCULAR | Status: DC | PRN
  Administered 2020-09-06: 8 mg via INTRAVENOUS

## 2020-09-06 MED ORDER — PROPOFOL 500 MG/50ML IV EMUL
INTRAVENOUS | Status: DC | PRN
  Administered 2020-09-06: 130 ug/kg/min via INTRAVENOUS

## 2020-09-06 MED ORDER — SODIUM CHLORIDE 0.9 % IV SOLN
INTRAVENOUS | Status: DC
  Administered 2020-09-06: 20 mL/h via INTRAVENOUS

## 2020-09-06 MED ORDER — GLYCOPYRROLATE 0.2 MG/ML IJ SOLN
INTRAMUSCULAR | Status: DC | PRN
  Administered 2020-09-06: .2 mg via INTRAVENOUS

## 2020-09-06 MED ORDER — PHENYLEPHRINE HCL (PRESSORS) 10 MG/ML IV SOLN
INTRAVENOUS | Status: AC
  Filled 2020-09-06: qty 1

## 2020-09-06 MED ORDER — PROPOFOL 500 MG/50ML IV EMUL
INTRAVENOUS | Status: AC
  Filled 2020-09-06: qty 100

## 2020-09-06 MED ORDER — PANTOPRAZOLE SODIUM 40 MG PO TBEC: 40.0000 mg | DELAYED_RELEASE_TABLET | Freq: Two times a day (BID) | ORAL | 1 refills | Status: DC

## 2020-09-06 MED ORDER — PROPOFOL 10 MG/ML IV BOLUS
INTRAVENOUS | Status: DC | PRN
  Administered 2020-09-06: 70 mg via INTRAVENOUS

## 2020-09-06 MED ORDER — LIDOCAINE HCL (CARDIAC) PF 100 MG/5ML IV SOSY
PREFILLED_SYRINGE | INTRAVENOUS | Status: DC | PRN
Start: 2020-09-06 — End: 2020-09-06
  Administered 2020-09-06: 100 mg via INTRAVENOUS

## 2020-09-06 NOTE — H&P (Signed)
[name], MD [address]  [place]  [place], [place] [address]  Main: [phone]  Fax: [phone] Pager: [phone]  Primary Care Physician:  [name], MD Primary Gastroenterologist:  Dr. [name]  Pre-Procedure History & Physical: HPI:  Albert Giles is a 75 y.o. male is here for an endoscopy and colonoscopy.   Past Medical History:  Diagnosis Date   CAD (coronary artery disease) 06/2014   UA with NSTEMI - cath with 99% prox L circ s/p stent, EF 40% ([place], [place] at [facility])   Emphysema    mild   Ex-smoker    NSTEMI (non-ST elevated myocardial infarction) ([place]) 07/13/2014    Past Surgical History:  Procedure Laterality Date   CARDIAC CATHETERIZATION N/A 07/14/2014   Left Heart Cath and Coronary Angiography with stent placement;  Surgeon: Teodoro Spray, MD   CARDIAC CATHETERIZATION N/A 07/14/2014   Coronary Stent Intervention;  Surgeon: Yolonda Kida, MD   CYSTECTOMY     on back   [place] Right 08/17/04    Prior to Admission medications   Medication Sig Start Date End Date Taking? Authorizing Provider  aspirin EC 81 MG tablet Take 1 tablet (81 mg total) by mouth daily. 07/15/14  Yes Henreitta Leber, MD  atorvastatin (LIPITOR) 40 MG tablet Take 1 tablet (40 mg total) by mouth daily at 6 PM. 07/15/14  Yes Sainani, Belia Heman, MD  Cholecalciferol (VITAMIN D3) 25 MCG (1000 UT) CAPS Take 1 capsule (1,000 Units total) by mouth daily. 09/13/19  Yes [name], MD  clopidogrel (PLAVIX) 75 MG tablet Take 1 tablet (75 mg total) by mouth daily. 07/15/14  Yes Sainani, Belia Heman, MD  Cyanocobalamin (B-12) 1000 MCG SUBL Place 1 tablet under the tongue daily. 09/13/19  Yes [name], MD  ferrous sulfate 324 (65 Fe) MG TBEC Take 1 tablet (325 mg total) by mouth every other day. 06/21/20 12/18/20 Yes [name], MD  metoprolol tartrate (LOPRESSOR) 25 MG tablet Take 1 tablet (25 mg total) by mouth 2 (two) times daily. 07/15/14  Yes Sainani,  Belia Heman, MD  tadalafil (CIALIS) 20 MG tablet TAKE ONE TABLET BY MOUTH DAILY AS NEEDED FOR ERECTILE DYSFUNCTION 03/23/20  Yes McGowan, Larene Beach A, PA-C    Allergies as of 08/15/2020   (No Known Allergies)    Family History  Problem Relation Age of Onset   Cancer Mother 41       colon   Cancer Sister        breast   Crohn's disease Sister    Cancer Daughter        anal   Crohn's disease Niece    CAD Neg Hx    Stroke Neg Hx    Diabetes Neg Hx    Prostate cancer Neg Hx    Kidney cancer Neg Hx    Bladder Cancer Neg Hx     Social History   Socioeconomic History   Marital status: Married    Spouse name: Not on file   Number of children: Not on file   Years of education: Not on file   Highest education level: Not on file  Occupational History   Not on file  Tobacco Use   Smoking status: Former    Packs/day: 1.00    Years: 35.00    Pack years: 35.00    Types: Cigarettes    Quit date: 07/07/1998    Years since quitting: 22.1   Smokeless tobacco: Never  Vaping  Use   Vaping Use: Never used  Substance and Sexual Activity   Alcohol use: No   Drug use: No   Sexual activity: Not on file  Other Topics Concern   Not on file  Social History Narrative   Caffeine: 2 cups coffee, 2 cups tea/day   Lives with wife   Occupation: Higher education careers adviser, retired   Edu: HS   Activity: works in yard, walking   Diet: some water, fruits/vegetables daily   Social Determinants of Radio broadcast assistant Strain: Low Risk    Difficulty of Paying Living Expenses: Not hard at all  Food Insecurity: No Food Insecurity   Worried About Charity fundraiser in the Last Year: Never true   Arboriculturist in the Last Year: Never true  Transportation Needs: No Transportation Needs   Lack of Transportation (Medical): No   Lack of Transportation (Non-Medical): No  Physical Activity: Inactive   Days of Exercise per Week: 0 days   Minutes of Exercise per Session: 0 min  Stress: No Stress Concern  Present   Feeling of Stress : Not at all  Social Connections: Not on file  Intimate Partner Violence: Not At Risk   Fear of Current or Ex-Partner: No   Emotionally Abused: No   Physically Abused: No   Sexually Abused: No    Review of Systems: See HPI, otherwise negative ROS  Physical Exam: BP (!) 142/92   Pulse 94   Temp (!) 96.6 F (35.9 C) (Temporal)   Resp 20   Ht '5\' 8"'$  (1.727 m)   Wt 76 kg   SpO2 99%   BMI 25.47 kg/m  General:   Alert,  pleasant and cooperative in NAD Head:  Normocephalic and atraumatic. Neck:  Supple; no masses or thyromegaly. Lungs:  Clear throughout to auscultation.    Heart:  Regular rate and rhythm. Abdomen:  Soft, nontender and nondistended. Normal bowel sounds, without guarding, and without rebound.   Neurologic:  Alert and  oriented x4;  grossly normal neurologically.  Impression/Plan: [name] is here for an endoscopy and colonoscopy to be performed for IDA  Risks, benefits, limitations, and alternatives regarding  endoscopy and colonoscopy have been reviewed with the patient.  Questions have been answered.  All parties agreeable.   Sherri Sear, MD  09/06/2020, 7:51 AM

## 2020-09-06 NOTE — Anesthesia Preprocedure Evaluation (Addendum)
Anesthesia Evaluation  Patient identified by MRN, date of birth, ID band Patient awake    Reviewed: Allergy & Precautions, NPO status , Patient's Chart, lab work & pertinent test results, reviewed documented beta blocker date and time   Airway Mallampati: III   Neck ROM: Full  Mouth opening: Limited Mouth Opening  Dental  (+) Missing,    Pulmonary COPD, former smoker,    Pulmonary exam normal        Cardiovascular Exercise Tolerance: Good + CAD (2016: cath with 99% prox L circ s/p stent, EF 40%), + Past MI (NSTEMI ) and + Cardiac Stents (2016)   Rhythm:Regular     Neuro/Psych negative neurological ROS  negative psych ROS   GI/Hepatic negative GI ROS, Neg liver ROS,   Endo/Other  negative endocrine ROS  Renal/GU CRFRenal disease     Musculoskeletal negative musculoskeletal ROS (+)   Abdominal Normal abdominal exam  (+)   Peds  Hematology  (+) anemia ,   Anesthesia Other Findings   Reproductive/Obstetrics                           Anesthesia Physical Anesthesia Plan  ASA: 3  Anesthesia Plan: General   Post-op Pain Management:    Induction:   PONV Risk Score and Plan: TIVA  Airway Management Planned: Simple Face Mask  Additional Equipment:   Intra-op Plan:   Post-operative Plan:   Informed Consent: I have reviewed the patients History and Physical, chart, labs and discussed the procedure including the risks, benefits and alternatives for the proposed anesthesia with the patient or authorized representative who has indicated his/her understanding and acceptance.     Dental advisory given  Plan Discussed with: CRNA and Anesthesiologist  Anesthesia Plan Comments:         Anesthesia Quick Evaluation

## 2020-09-06 NOTE — Telephone Encounter (Signed)
-----   Message from Lin Landsman, MD sent at 09/06/2020 11:01 AM EDT ----- Regarding: Referral Please refer him to oncology ASAP  Dx: Colorectal malignancy, synchronous cancer  RV

## 2020-09-06 NOTE — OR Nursing (Signed)
Patient told to stop Plavix 5 days prior to procedure; but only stopped for 3 days.  Dr. Marius Ditch aware.

## 2020-09-06 NOTE — Transfer of Care (Signed)
Immediate Anesthesia Transfer of Care Note  Patient: Albert Giles  Procedure(s) Performed: COLONOSCOPY WITH PROPOFOL ESOPHAGOGASTRODUODENOSCOPY (EGD)  Patient Location: PACU and Endoscopy Unit  Anesthesia Type:General  Level of Consciousness: drowsy  Airway & Oxygen Therapy: Patient Spontanous Breathing  Post-op Assessment: Report given to RN  Post vital signs: stable  Last Vitals:  Vitals Value Taken Time  BP    Temp    Pulse    Resp    SpO2      Last Pain:  Vitals:   09/06/20 0652  TempSrc: Temporal  PainSc: 0-No pain         Complications: No notable events documented.

## 2020-09-06 NOTE — Telephone Encounter (Signed)
Placed a urgent referral to oncology

## 2020-09-06 NOTE — Op Note (Signed)
[facility] Patient Name: Albert Giles Procedure Date: 09/06/2020 7:07 AM MRN: [id number] Account #: [id number] Date of Birth: 07-Jan-1946 Admit Type: Outpatient Age: 75 Room: [facility] ENDO ROOM 4 Gender: Male Note Status: Finalized Procedure:             Colonoscopy Indications:           This is the patient's first colonoscopy, Unexplained                         iron deficiency anemia Providers:             Lin Landsman MD, MD Referring MD:          [name] (Referring MD) Medicines:             General Anesthesia Complications:         No immediate complications. Estimated blood loss:                         Minimal. Procedure:             Pre-Anesthesia Assessment:                        - Prior to the procedure, a History and Physical was                         performed, and patient medications and allergies were                         reviewed. The patient is competent. The risks and                         benefits of the procedure and the sedation options and                         risks were discussed with the patient. All questions                         were answered and informed consent was obtained.                         Patient identification and proposed procedure were                         verified by the physician, the nurse, the                         anesthesiologist, the anesthetist and the technician                         in the pre-procedure area in the procedure room in the                         endoscopy suite. Mental Status Examination: alert and                         oriented. Airway Examination: normal oropharyngeal  airway and neck mobility. Respiratory Examination:                         clear to auscultation. CV Examination: normal.                         Prophylactic Antibiotics: The patient does not require                         prophylactic antibiotics. Prior  Anticoagulants: The                         patient has taken Plavix (clopidogrel), last dose was                         3 days prior to procedure. ASA Grade Assessment: III -                         A patient with severe systemic disease. After                         reviewing the risks and benefits, the patient was                         deemed in satisfactory condition to undergo the                         procedure. The anesthesia plan was to use general                         anesthesia. Immediately prior to administration of                         medications, the patient was re-assessed for adequacy                         to receive sedatives. The heart rate, respiratory                         rate, oxygen saturations, blood pressure, adequacy of                         pulmonary ventilation, and response to care were                         monitored throughout the procedure. The physical                         status of the patient was re-assessed after the                         procedure.                        After obtaining informed consent, the colonoscope was                         passed under direct vision. Throughout the procedure,  the patient's blood pressure, pulse, and oxygen                         saturations were monitored continuously. The                         Colonoscope was introduced through the anus and                         advanced to the the transverse colon to examine a                         mass. This was the intended extent. The colonoscopy                         was performed without difficulty. The patient                         tolerated the procedure well. The quality of the bowel                         preparation was adequate. Findings:      The perianal and digital rectal examinations were normal. Pertinent       negatives include normal sphincter tone and no palpable rectal lesions.      A  frond-like/villous and infiltrative partially obstructing large mass       was found in the transverse colon and at 70 cm proximal to the anus. The       mass was circumferential. Oozing was present. Biopsies were taken with a       cold forceps for histology. Estimated blood loss was minimal. Area was       tattooed with an injection of Spot (carbon black).      A 20 mm polyp was found in the transverse colon. The polyp was sessile.       Preparations were made for mucosal resection. Chromoscopy with methylene       blue was done to mark the borders of the lesion. Eleview was injected       with adequate lift of the lesion from the muscularis propria. Snare       mucosal resection with suction (via the working channel) retrieval was       performed. A 20 mm area was resected. Resection and retrieval were       complete. There was no bleeding at the end of the procedure. To prevent       bleeding after mucosal resection, three hemostatic clips were       successfully placed. There was no bleeding at the end of the procedure.       Area was tattooed with an injection of Spot (carbon black).      An infiltrative and ulcerated non-obstructing large mass was found in       the mid rectum and at 10 cm proximal to the anus. The mass was partially       circumferential (involving one-third of the lumen circumference). Oozing       was present. Biopsies were taken with a cold forceps for histology. Area       was tattooed with an injection of Spot (carbon black).      The retroflexed view of the distal  rectum and anal verge was normal and       showed no anal or rectal abnormalities. 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                         Biopsied. Tattooed.                        - One 20 mm polyp in the transverse colon, removed                         with mucosal resection. Resected  and retrieved. Clips                         were placed. Tattooed.                        - Rule out malignancy, tumor in the mid rectum and at                         10 cm proximal to the anus. Biopsied. Tattooed.                        - The distal rectum and anal verge are normal on                         retroflexion view.                        - Mucosal resection was performed. Resection and                         retrieval were complete. Recommendation:        - Discharge patient to home (with escort).                        - Resume previous diet today.                        - Continue present medications.                        - Resume Plavix (clopidogrel) in 5 days at prior dose.                         Refer to managing physician for further adjustment of                         therapy.                        - Await pathology results.                        - Refer to an oncologist at appointment to be  scheduled. Procedure Code(s):     --- Professional ---                        [phone], 51, Colonoscopy, flexible; with endoscopic                         mucosal resection                        45381, 59,52, Colonoscopy, flexible; with directed                         submucosal injection(s), any substance                        45380, 59,52, Colonoscopy, flexible; with biopsy,                         single or multiple Diagnosis Code(s):     --- Professional ---                        C18.4, Malignant neoplasm of transverse colon                        C18.9, Malignant neoplasm of colon, unspecified                        K56.690, Other partial intestinal obstruction                        D49.0, Neoplasm of unspecified behavior of digestive                         system                        K63.5, Polyp of colon                        D50.9, Iron deficiency anemia, unspecified CPT copyright 2019 American [employer] Association. All rights  reserved. The codes documented in this report are preliminary and upon coder review may  be revised to meet current compliance requirements. Dr. Ulyess Mort Lin Landsman MD, MD 09/06/2020 8:55:56 AM This report has been signed electronically. Number of Addenda: 0 Note Initiated On: 09/06/2020 7:07 AM Scope Withdrawal Time: 0 hours 15 minutes 8 seconds  Total Procedure Duration: 0 hours 37 minutes 39 seconds  Estimated Blood Loss:  Estimated blood loss was minimal.      [facility]

## 2020-09-06 NOTE — Op Note (Signed)
[facility] Patient Name: Albert Giles Procedure Date: 09/06/2020 7:52 AM MRN: [id number] Account #: [id number] Date of Birth: 1945-09-30 Admit Type: Outpatient Age: 75 Room: [facility] ENDO ROOM 4 Gender: Male Note Status: Finalized Procedure:             Upper GI endoscopy Indications:           Unexplained iron deficiency anemia Providers:             Lin Landsman MD, MD Referring MD:          [name] (Referring MD) Medicines:             General Anesthesia Complications:         No immediate complications. Estimated blood loss: None. Procedure:             Pre-Anesthesia Assessment:                        - Prior to the procedure, a History and Physical was                         performed, and patient medications and allergies were                         reviewed. The patient is competent. The risks and                         benefits of the procedure and the sedation options and                         risks were discussed with the patient. All questions                         were answered and informed consent was obtained.                         Patient identification and proposed procedure were                         verified by the physician, the nurse, the                         anesthesiologist, the anesthetist and the technician                         in the pre-procedure area in the procedure room in the                         endoscopy suite. Mental Status Examination: alert and                         oriented. Airway Examination: normal oropharyngeal                         airway and neck mobility. Respiratory Examination:                         clear to auscultation. CV Examination: normal.  Prophylactic Antibiotics: The patient does not require                         prophylactic antibiotics. Prior Anticoagulants: The                         patient has taken Plavix (clopidogrel), last  dose was                         3 days prior to procedure. ASA Grade Assessment: III -                         A patient with severe systemic disease. After                         reviewing the risks and benefits, the patient was                         deemed in satisfactory condition to undergo the                         procedure. The anesthesia plan was to use general                         anesthesia. Immediately prior to administration of                         medications, the patient was re-assessed for adequacy                         to receive sedatives. The heart rate, respiratory                         rate, oxygen saturations, blood pressure, adequacy of                         pulmonary ventilation, and response to care were                         monitored throughout the procedure. The physical                         status of the patient was re-assessed after the                         procedure.                        After obtaining informed consent, the endoscope was                         passed under direct vision. Throughout the procedure,                         the patient's blood pressure, pulse, and oxygen                         saturations were monitored continuously. The Endoscope  was introduced through the mouth, and advanced to the                         second part of duodenum. The upper GI endoscopy was                         accomplished without difficulty. The patient tolerated                         the procedure well. Findings:      The duodenal bulb and second portion of the duodenum were normal.       Biopsies for histology were taken with a cold forceps for evaluation of       celiac disease.      The entire examined stomach was normal. Biopsies were taken with a cold       forceps for Helicobacter pylori testing.      A 4 cm hiatal hernia was found. The proximal extent of the gastric folds       (end of  tubular esophagus) was 35 cm from the incisors. The hiatal       narrowing was 39 cm from the incisors. The Z-line was 35 cm from the       incisors.      LA Grade C (one or more mucosal breaks continuous between tops of 2 or       more mucosal folds, less than 75% circumference) esophagitis with no       bleeding was found in the lower third of the esophagus. Impression:            - Normal duodenal bulb and second portion of the                         duodenum. Biopsied.                        - Normal stomach. Biopsied.                        - 4 cm hiatal hernia.                        - LA Grade C reflux esophagitis with no bleeding. Recommendation:        - Await pathology results.                        - Follow an antireflux regimen indefinitely.                        - Use Protonix (pantoprazole) 40 mg PO BID                         indefinitely.                        - Proceed with colonoscopy as scheduled                        See colonoscopy report Procedure Code(s):     --- Professional ---  [id number], Esophagogastroduodenoscopy, flexible,                         transoral; with biopsy, single or multiple Diagnosis Code(s):     --- Professional ---                        K44.9, Diaphragmatic hernia without obstruction or                         gangrene                        K21.00, Gastro-esophageal reflux disease with                         esophagitis, without bleeding                        D50.9, Iron deficiency anemia, unspecified CPT copyright 2019 American Medical Association. All rights reserved. The codes documented in this report are preliminary and upon coder review may  be revised to meet current compliance requirements. Dr. Ulyess Mort Lin Landsman MD, MD 09/06/2020 8:11:00 AM This report has been signed electronically. Number of Addenda: 0 Note Initiated On: 09/06/2020 7:52 AM Estimated Blood Loss:  Estimated blood loss:  none.      [facility]

## 2020-09-07 ENCOUNTER — Telehealth: Payer: Self-pay | Admitting: Gastroenterology

## 2020-09-07 ENCOUNTER — Other Ambulatory Visit: Payer: Self-pay | Admitting: Gastroenterology

## 2020-09-07 ENCOUNTER — Encounter: Payer: Self-pay | Admitting: Gastroenterology

## 2020-09-07 NOTE — Telephone Encounter (Signed)
Called patient to discuss the pathology results and conveyed to him that the colon mass as well as rectal mass confirmed to be cancer.  He has appointment with Dr. Rogue Bussing on 8/29 at 2 PM.  Patient expressed understanding of the results  Albert Giles

## 2020-09-07 NOTE — Anesthesia Postprocedure Evaluation (Signed)
Anesthesia Post Note  Patient: Albert Giles  Procedure(s) Performed: COLONOSCOPY WITH PROPOFOL ESOPHAGOGASTRODUODENOSCOPY (EGD)  Patient location during evaluation: Endoscopy Anesthesia Type: General Level of consciousness: awake and alert Pain management: pain level controlled Vital Signs Assessment: post-procedure vital signs reviewed and stable Respiratory status: spontaneous breathing, nonlabored ventilation and respiratory function stable Cardiovascular status: blood pressure returned to baseline and stable Postop Assessment: no apparent nausea or vomiting Anesthetic complications: no   No notable events documented.   Last Vitals:  Vitals:   09/06/20 0856 09/06/20 0926  BP:  125/78  Pulse:    Resp:    Temp: (!) 36.1 C   SpO2:      Last Pain:  Vitals:   09/07/20 0834  TempSrc:   PainSc: 0-No pain                 [place]

## 2020-09-11 ENCOUNTER — Inpatient Hospital Stay: Payer: Medicare HMO | Attending: Internal Medicine | Admitting: Internal Medicine

## 2020-09-11 ENCOUNTER — Encounter: Payer: Self-pay | Admitting: Internal Medicine

## 2020-09-11 ENCOUNTER — Other Ambulatory Visit: Payer: Self-pay

## 2020-09-11 ENCOUNTER — Inpatient Hospital Stay: Payer: Medicare HMO

## 2020-09-11 ENCOUNTER — Encounter (INDEPENDENT_AMBULATORY_CARE_PROVIDER_SITE_OTHER): Payer: Self-pay

## 2020-09-11 VITALS — BP 153/92 | HR 74 | Temp 97.2°F | Resp 20 | Ht 68.0 in | Wt 173.0 lb

## 2020-09-11 DIAGNOSIS — C20 Malignant neoplasm of rectum: Secondary | ICD-10-CM | POA: Diagnosis not present

## 2020-09-11 DIAGNOSIS — C184 Malignant neoplasm of transverse colon: Secondary | ICD-10-CM | POA: Insufficient documentation

## 2020-09-11 DIAGNOSIS — D508 Other iron deficiency anemias: Secondary | ICD-10-CM

## 2020-09-11 DIAGNOSIS — D509 Iron deficiency anemia, unspecified: Secondary | ICD-10-CM | POA: Insufficient documentation

## 2020-09-11 DIAGNOSIS — Z79899 Other long term (current) drug therapy: Secondary | ICD-10-CM | POA: Diagnosis not present

## 2020-09-11 LAB — COMPREHENSIVE METABOLIC PANEL
ALT: 14 U/L (ref 0–44)
AST: 16 U/L (ref 15–41)
Albumin: 3.9 g/dL (ref 3.5–5.0)
Alkaline Phosphatase: 103 U/L (ref 38–126)
Anion gap: 11 (ref 5–15)
BUN: 11 mg/dL (ref 8–23)
CO2: 23 mmol/L (ref 22–32)
Calcium: 8.6 mg/dL — ABNORMAL LOW (ref 8.9–10.3)
Chloride: 103 mmol/L (ref 98–111)
Creatinine, Ser: 1.38 mg/dL — ABNORMAL HIGH (ref 0.61–1.24)
GFR, Estimated: 53 mL/min — ABNORMAL LOW (ref >60)
Glucose, Bld: 104 mg/dL — ABNORMAL HIGH (ref 70–99)
Potassium: 3.4 mmol/L — ABNORMAL LOW (ref 3.5–5.1)
Sodium: 137 mmol/L (ref 135–145)
Total Bilirubin: 0.6 mg/dL (ref 0.3–1.2)
Total Protein: 7.7 g/dL (ref 6.5–8.1)

## 2020-09-11 LAB — CBC WITH DIFFERENTIAL/PLATELET
Abs Immature Granulocytes: 0.04 10*3/uL (ref 0.00–0.07)
Basophils Absolute: 0.1 10*3/uL (ref 0.0–0.1)
Basophils Relative: 1 %
Eosinophils Absolute: 0.3 10*3/uL (ref 0.0–0.5)
Eosinophils Relative: 4 %
HCT: 33.8 % — ABNORMAL LOW (ref 39.0–52.0)
Hemoglobin: 9.8 g/dL — ABNORMAL LOW (ref 13.0–17.0)
Immature Granulocytes: 0 %
Lymphocytes Relative: 15 %
Lymphs Abs: 1.5 10*3/uL (ref 0.7–4.0)
MCH: 23 pg — ABNORMAL LOW (ref 26.0–34.0)
MCHC: 29 g/dL — ABNORMAL LOW (ref 30.0–36.0)
MCV: 79.3 fL — ABNORMAL LOW (ref 80.0–100.0)
Monocytes Absolute: 0.7 10*3/uL (ref 0.1–1.0)
Monocytes Relative: 8 %
Neutro Abs: 7 10*3/uL (ref 1.7–7.7)
Neutrophils Relative %: 72 %
Platelets: 324 10*3/uL (ref 150–400)
RBC: 4.26 MIL/uL (ref 4.22–5.81)
RDW: 17.9 % — ABNORMAL HIGH (ref 11.5–15.5)
WBC: 9.7 10*3/uL (ref 4.0–10.5)
nRBC: 0 % (ref 0.0–0.2)

## 2020-09-11 LAB — IRON AND TIBC
Iron: 19 ug/dL — ABNORMAL LOW (ref 45–182)
Saturation Ratios: 5 % — ABNORMAL LOW (ref 17.9–39.5)
TIBC: 412 ug/dL (ref 250–450)
UIBC: 393 ug/dL

## 2020-09-11 LAB — FERRITIN: Ferritin: 23 ng/mL — ABNORMAL LOW (ref 24–336)

## 2020-09-11 NOTE — Progress Notes (Signed)
[place] NOTE  Patient Care Team: [name], MD as PCP - General (Family Medicine) Clent Jacks, RN as Oncology Nurse Navigator  CHIEF COMPLAINTS/PURPOSE OF CONSULTATION: colon/rectal cancer  #  Oncology History Overview Note  # Malignant partially obstructing tumor in the transverse colon/70 cm proximal to the anus- C. COLON MASS, 70 CM; COLD BIOPSY:  - INTRAMUCOSAL ADENOCARCINOMA AT LEAST;  # One 20 mm polyp in the transverse colon, removed with mucosal resection. Resected and retrieved. Clips were placed. Tattooed.  # tumor in the mid rectum and at 10 cm proximal to the anus. Biopsied.      SEE COMMENT.   Comment:  There is no definitive evidence of invasion in this sample.  The  findings may not accurately represent the entire underlying lesion;  clinical correlation is recommended.   D. COLON POLYP, TRANSVERSE; HOT SNARE:  - TUBULOVILLOUS ADENOMA.  - NEGATIVE FOR HIGH GRADE DYSPLASIA AND MALIGNANCY.   E. RECTUM MASS; COLD BIOPSY:  - INVASIVE ADENOCARCINOMA, MODERATELY TO POORLY DIFFERENTIATED. ----------------------------------     Rectal cancer ([place])  09/11/2020 Initial Diagnosis   Rectal cancer ([place])   09/12/2020 -  Chemotherapy    Patient is on Treatment Plan: COLORECTAL FOLFOX Q14D X 4 MONTHS          HISTORY OF PRESENTING ILLNESS:  Albert Giles 75 y.o.  male is here for follow-up with new diagnosis of colon cancer/rectal cancer.  Patient had a positive Cologuard in May 2021.  Finally patient had a recent colonoscopy that showed polypoid growths in the transverse colon/and rectum- [as noted above; pathology above].  He is here to discuss further treatment options.  No weight loss.  No nausea or vomiting.  No blood in stools or black or stools.  No tingling or numbness.  Previous colonoscopy-none.   Review of Systems  Constitutional:  Negative for chills, diaphoresis, fever and weight loss.  HENT:  Negative for  nosebleeds and sore throat.   Eyes:  Negative for double vision.  Respiratory:  Negative for cough, hemoptysis, sputum production, shortness of breath and wheezing.   Cardiovascular:  Negative for chest pain, palpitations, orthopnea and leg swelling.  Gastrointestinal:  Negative for abdominal pain, blood in stool, constipation, diarrhea, heartburn, melena, nausea and vomiting.  Genitourinary:  Negative for dysuria, frequency and urgency.  Skin: Negative.  Negative for itching and rash.  Neurological:  Negative for dizziness, tingling, focal weakness, weakness and headaches.  Endo/Heme/Allergies:  Does not bruise/bleed easily.  Psychiatric/Behavioral:  Negative for depression. The patient is not nervous/anxious and does not have insomnia.     MEDICAL HISTORY:  Past Medical History:  Diagnosis Date   CAD (coronary artery disease) 06/2014   UA with NSTEMI - cath with 99% prox L circ s/p stent, EF 40% ([place]ath, [place] at [facility])   Emphysema    mild   Ex-smoker    NSTEMI (non-ST elevated myocardial infarction) ([place]) 07/13/2014    SURGICAL HISTORY: Past Surgical History:  Procedure Laterality Date   CARDIAC CATHETERIZATION N/A 07/14/2014   Left Heart Cath and Coronary Angiography with stent placement;  Surgeon: Teodoro Spray, MD   CARDIAC CATHETERIZATION N/A 07/14/2014   Coronary Stent Intervention;  Surgeon: Yolonda Kida, MD   COLONOSCOPY WITH PROPOFOL N/A 09/06/2020   Procedure: COLONOSCOPY WITH PROPOFOL;  Surgeon: Lin Landsman, MD;  Location: [place];  Service: Gastroenterology;  Laterality: N/A;   CYSTECTOMY     on back   ESOPHAGOGASTRODUODENOSCOPY N/A 09/06/2020  Procedure: ESOPHAGOGASTRODUODENOSCOPY (EGD);  Surgeon: Lin Landsman, MD;  Location: [facility] ENDOSCOPY;  Service: Gastroenterology;  Laterality: N/A;   INGUINAL HERNIA REPAIR Right 08/17/04    SOCIAL HISTORY: Social History   Socioeconomic History   Marital status: Married    Spouse name: Not on file    Number of children: Not on file   Years of education: Not on file   Highest education level: Not on file  Occupational History   Not on file  Tobacco Use   Smoking status: Former    Packs/day: 1.00    Years: 35.00    Pack years: 35.00    Types: Cigarettes    Quit date: 07/07/1998    Years since quitting: 22.2   Smokeless tobacco: Never  Vaping Use   Vaping Use: Never used  Substance and Sexual Activity   Alcohol use: No   Drug use: No   Sexual activity: Not on file  Other Topics Concern   Not on file  Social History Narrative   Caffeine: 2 cups coffee, 2 cups tea/day   Lives with wife   Occupation: Higher education careers adviser, retired   Edu: HS   Activity: works in yard, walking   Diet: some water, fruits/vegetables daily   -------------------------------------------------------------------       [employer]a St[employer] [place]; [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    Social Determinants of Health   Financial Resource Strain: Low Risk    Difficulty of Paying Living Expenses: Not hard at all  Food Insecurity: No Food Insecurity   Worried About Charity fundraiser in the Last Year: Never true   [place] in the Last Year: Never true  Transportation Needs: No Transportation Needs   Lack of Transportation (Medical): No   Lack of Transportation (Non-Medical): No  Physical Activity: Inactive   Days of Exercise per Week: 0 days   Minutes of Exercise per Session: 0 min  Stress: No Stress Concern Present   Feeling of Stress : Not at all  Social Connections: Not on file  Intimate Partner Violence: Not At Risk   Fear of Current or Ex-Partner: No   Emotionally Abused: No   Physically Abused: No   Sexually Abused: No    FAMILY HISTORY: Family History  Problem Relation Age of Onset   Cancer Mother 42       colon   Cancer Sister        breast   Crohn's disease Sister    Cancer Daughter        anal   Crohn's disease Niece     CAD Neg Hx    Stroke Neg Hx    Diabetes Neg Hx    Prostate cancer Neg Hx    Kidney cancer Neg Hx    Bladder Cancer Neg Hx     ALLERGIES:  has No Known Allergies.  MEDICATIONS:  Current Outpatient Medications  Medication Sig Dispense Refill   aspirin EC 81 MG tablet Take 1 tablet (81 mg total) by mouth daily. 60 tablet 1   atorvastatin (LIPITOR) 40 MG tablet Take 1 tablet (40 mg total) by mouth daily at 6 PM. 60 tablet 1   Cholecalciferol (VITAMIN D3) 25 MCG (1000 UT) CAPS Take 1 capsule (1,000 Units total) by mouth daily. 30 capsule    clopidogrel (PLAVIX) 75 MG tablet Take 1 tablet (75 mg total) by mouth daily. 60 tablet 1   Cyanocobalamin (B-12) 1000 MCG  SUBL Place 1 tablet under the tongue daily.     ferrous sulfate 324 (65 Fe) MG TBEC Take 1 tablet (325 mg total) by mouth every other day.     metoprolol tartrate (LOPRESSOR) 25 MG tablet Take 1 tablet (25 mg total) by mouth 2 (two) times daily. 60 tablet 1   pantoprazole (PROTONIX) 40 MG tablet Take 1 tablet (40 mg total) by mouth 2 (two) times daily before a meal. 180 tablet 1   tadalafil (CIALIS) 20 MG tablet TAKE ONE TABLET BY MOUTH DAILY AS NEEDED FOR ERECTILE DYSFUNCTION 20 tablet 3   No current facility-administered medications for this visit.      Marland Kitchen  PHYSICAL EXAMINATION: ECOG PERFORMANCE STATUS: 0 - Asymptomatic  Vitals:   09/11/20 1345  BP: (!) 153/92  Pulse: 74  Resp: 20  Temp: (!) 97.2 F (36.2 C)   Filed Weights   09/11/20 1349  Weight: 173 lb (78.5 kg)    Physical Exam Vitals and nursing note reviewed.  HENT:     Head: Normocephalic and atraumatic.     Mouth/Throat:     Pharynx: Oropharynx is clear.  Eyes:     Extraocular Movements: Extraocular movements intact.     Pupils: Pupils are equal, round, and reactive to light.  Cardiovascular:     Rate and Rhythm: Normal rate and regular rhythm.  Pulmonary:     Comments: Decreased breath sounds bilaterally.  Abdominal:     Palpations: Abdomen is  soft.  Musculoskeletal:        General: Normal range of motion.     Cervical back: Normal range of motion.  Skin:    General: Skin is warm.  Neurological:     General: No focal deficit present.     Mental Status: He is alert and oriented to person, place, and time.  Psychiatric:        Behavior: Behavior normal.        Judgment: Judgment normal.     LABORATORY DATA:  I have reviewed the data as listed Lab Results  Component Value Date   WBC 9.7 09/11/2020   HGB 9.8 (L) 09/11/2020   HCT 33.8 (L) 09/11/2020   MCV 79.3 (L) 09/11/2020   PLT 324 09/11/2020   Recent Labs    06/14/20 0806 09/11/20 1511  NA 139 137  K 4.2 3.4*  CL 106 103  CO2 24 23  GLUCOSE 101* 104*  BUN 16 11  CREATININE 1.77* 1.38*  CALCIUM 9.5 8.6*  GFRNONAA  --  53*  PROT 6.5 7.7  ALBUMIN 4.1 3.9  AST 11 16  ALT 8 14  ALKPHOS 114 103  BILITOT 0.4 0.6    RADIOGRAPHIC STUDIES: I have personally reviewed the radiological images as listed and agreed with the findings in the report. No results found.  ASSESSMENT & PLAN:   Rectal cancer ([place])  #Synchronous primaries a] rectal cancer-10cm-adenocarcinoma & b] transverse colon- 70cm- intramucosal adenoca] from anus.   #Recommend staging work-up ASAP- PET scan; rectal MRI.  #Discussed the role of chemotherapy; radiation; and surgery-based on above staging work-up.  #I discussed the role of "total neoadjuvant chemotherapy"-which would include 4 months of FOLFOX chemotherapy-followed by chemoradiation 5 to 6 weeks.  Post therapy-recommend repeating imaging; followed by curative surgery.  # I discussed that FOLFOX chemotherapy is given every 2 weeks; discuss the potential side effects including but not limited to nausea vomiting diarrhea, sores in the mouth, hand-foot syndrome; also tingling and numbness/cold sensitivity with oxaliplatin.  #  Anemia/iron deficiency hemoglobin ~9; secondary to chronic GI bleeding/tumor.  Recommend Venofer at next visit.    Thank you Dr.Vanga  for allowing me to participate in the care of your pleasant patient. Please do not hesitate to contact me with questions or concerns in the interim.  The above plan of care was discussed with the patient/and his daughter. Also discussed with nurse navigator, Mathis Fare.   # DISPOSITION:  # labs- cbc/cmp;iron studies/ferritin; CEA #Referral to Dr.Byrnett re: colo-recatl caner/port placement # chemo ed next week- FOLFOX # PET ASAP # MRI rectum ASAP # follow up in 2 weeks- MD: labs- cbc/cmp-Dr.B  All questions were answered. The patient knows to call the clinic with any problems, questions or concerns.    Cammie Sickle, MD 09/12/2020 2:58 PM

## 2020-09-11 NOTE — Progress Notes (Signed)
Introduced Therapist, nutritional and provided contact information for future needs. Referral sent to Dr. Bary Castilla.

## 2020-09-11 NOTE — Assessment & Plan Note (Addendum)
#  Synchronous primaries a] rectal cancer-10cm-adenocarcinoma & b] transverse colon- 70cm- intramucosal adenoca] from anus.   #Recommend staging work-up ASAP- PET scan; rectal MRI.  #Discussed the role of chemotherapy; radiation; and surgery-based on above staging work-up.  #I discussed the role of "total neoadjuvant chemotherapy"-which would include 4 months of FOLFOX chemotherapy-followed by chemoradiation 5 to 6 weeks.  Post therapy-recommend repeating imaging; followed by curative surgery.  # I discussed that FOLFOX chemotherapy is given every 2 weeks; discuss the potential side effects including but not limited to nausea vomiting diarrhea, sores in the mouth, hand-foot syndrome; also tingling and numbness/cold sensitivity with oxaliplatin.  # Anemia/iron deficiency hemoglobin ~9; secondary to chronic GI bleeding/tumor.  Recommend Venofer at next visit.   Thank you Dr.Vanga  for allowing me to participate in the care of your pleasant patient. Please do not hesitate to contact me with questions or concerns in the interim.  The above plan of care was discussed with the patient/and his daughter. Also discussed with nurse navigator, Mathis Fare.   # DISPOSITION:  # labs- cbc/cmp;iron studies/ferritin; CEA #Referral to Dr.Byrnett re: colo-recatl caner/port placement # chemo ed next week- FOLFOX # PET ASAP # MRI rectum ASAP # follow up in 2 weeks- MD: labs- cbc/cmp-Dr.B

## 2020-09-12 ENCOUNTER — Telehealth: Payer: Self-pay

## 2020-09-12 ENCOUNTER — Encounter: Payer: Self-pay | Admitting: Internal Medicine

## 2020-09-12 ENCOUNTER — Other Ambulatory Visit: Payer: Self-pay

## 2020-09-12 DIAGNOSIS — C20 Malignant neoplasm of rectum: Secondary | ICD-10-CM

## 2020-09-12 LAB — CEA: CEA: 4.6 ng/mL (ref 0.0–4.7)

## 2020-09-12 NOTE — Progress Notes (Signed)
Patient on schedule for  Port placement 09/12/2020, spoke with patient's wife on phone with pre procedure instructions given.made aware to be here @ 0900, NPO after MN tonight, as well as driver post procedure/recovery/discharge. Stated understanding.

## 2020-09-12 NOTE — Telephone Encounter (Signed)
Call placed to Mr. Focht. Port placement is scheduled for 09/13/20. Arrive at 0900 for 1000 appointment. Instructed he would need a driver. No Plavix hold per scheduling. Chemo education remains scheduled for 09/19/20. PET scheduled for 09/21/20 with 1000 arrival for 1030 appointment. Went over instructions/prep. MRI date pending. Mr. 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 Referral sent to Dr. Dema Severin.

## 2020-09-12 NOTE — Progress Notes (Signed)
START ON PATHWAY REGIMEN - Colorectal     A cycle is every 14 days:     Oxaliplatin      Leucovorin      Fluorouracil      Fluorouracil   **Always confirm dose/schedule in your pharmacy ordering system**  Patient Characteristics: Preoperative or Nonsurgical Candidate (Clinical Staging), Rectal, cT3 - cT4, cN0 or Any cT, cN+ Tumor Location: Rectal Therapeutic Status: Preoperative or Nonsurgical Candidate (Clinical Staging) AJCC T Category: cT3 AJCC N Category: cN0 AJCC M Category: cM0 AJCC 8 Stage Grouping: IIA Intent of Therapy: Curative Intent, Discussed with Patient

## 2020-09-13 ENCOUNTER — Encounter: Payer: Self-pay | Admitting: Radiology

## 2020-09-13 ENCOUNTER — Ambulatory Visit
Admission: RE | Admit: 2020-09-13 | Discharge: 2020-09-13 | Disposition: A | Discharge status: Home / Self Care | Payer: Medicare HMO | Source: Ambulatory Visit | Attending: Internal Medicine | Admitting: Internal Medicine

## 2020-09-13 ENCOUNTER — Other Ambulatory Visit: Payer: Self-pay

## 2020-09-13 DIAGNOSIS — I2510 Atherosclerotic heart disease of native coronary artery without angina pectoris: Secondary | ICD-10-CM | POA: Insufficient documentation

## 2020-09-13 DIAGNOSIS — Z87891 Personal history of nicotine dependence: Secondary | ICD-10-CM | POA: Insufficient documentation

## 2020-09-13 DIAGNOSIS — Z800 Family history of malignant neoplasm of digestive organs: Secondary | ICD-10-CM | POA: Diagnosis not present

## 2020-09-13 DIAGNOSIS — Z803 Family history of malignant neoplasm of breast: Secondary | ICD-10-CM | POA: Diagnosis not present

## 2020-09-13 DIAGNOSIS — I252 Old myocardial infarction: Secondary | ICD-10-CM | POA: Insufficient documentation

## 2020-09-13 DIAGNOSIS — Z452 Encounter for adjustment and management of vascular access device: Secondary | ICD-10-CM | POA: Diagnosis not present

## 2020-09-13 DIAGNOSIS — Z7901 Long term (current) use of anticoagulants: Secondary | ICD-10-CM | POA: Diagnosis not present

## 2020-09-13 DIAGNOSIS — C20 Malignant neoplasm of rectum: Secondary | ICD-10-CM

## 2020-09-13 DIAGNOSIS — Z7902 Long term (current) use of antithrombotics/antiplatelets: Secondary | ICD-10-CM | POA: Diagnosis not present

## 2020-09-13 DIAGNOSIS — Z7982 Long term (current) use of aspirin: Secondary | ICD-10-CM | POA: Diagnosis not present

## 2020-09-13 DIAGNOSIS — C19 Malignant neoplasm of rectosigmoid junction: Secondary | ICD-10-CM | POA: Insufficient documentation

## 2020-09-13 HISTORY — PX: IR IMAGING GUIDED PORT INSERTION: IMG5740

## 2020-09-13 MED ORDER — HEPARIN SOD (PORK) LOCK FLUSH 100 UNIT/ML IV SOLN
INTRAVENOUS | Status: AC
  Filled 2020-09-13: qty 5

## 2020-09-13 MED ORDER — LIDOCAINE-EPINEPHRINE 1 %-1:100000 IJ SOLN
INTRAMUSCULAR | Status: AC
  Filled 2020-09-13: qty 1

## 2020-09-13 MED ORDER — MIDAZOLAM HCL 2 MG/2ML IJ SOLN
INTRAMUSCULAR | Status: AC
  Filled 2020-09-13: qty 2

## 2020-09-13 MED ORDER — FENTANYL CITRATE (PF) 100 MCG/2ML IJ SOLN
INTRAMUSCULAR | Status: AC
  Filled 2020-09-13: qty 2

## 2020-09-13 MED ORDER — LIDOCAINE-EPINEPHRINE 1 %-1:100000 IJ SOLN
INTRAMUSCULAR | Status: AC | PRN
  Administered 2020-09-13: 10 mL via INTRADERMAL

## 2020-09-13 MED ORDER — FENTANYL CITRATE (PF) 100 MCG/2ML IJ SOLN
INTRAMUSCULAR | Status: AC | PRN
  Administered 2020-09-13: 50 ug via INTRAVENOUS

## 2020-09-13 MED ORDER — HEPARIN SOD (PORK) LOCK FLUSH 100 UNIT/ML IV SOLN
INTRAVENOUS | Status: AC
  Administered 2020-09-13: 500 [IU]
  Filled 2020-09-13: qty 5

## 2020-09-13 MED ORDER — MIDAZOLAM HCL 5 MG/5ML IJ SOLN
INTRAMUSCULAR | Status: AC | PRN
  Administered 2020-09-13: 1 mg via INTRAVENOUS

## 2020-09-13 MED ORDER — SODIUM CHLORIDE 0.9 % IV SOLN
INTRAVENOUS | Status: DC
  Administered 2020-09-13: 1000 mL via INTRAVENOUS
  Filled 2020-09-13: qty 1000

## 2020-09-13 NOTE — Procedures (Signed)
Interventional Radiology Procedure Note  Procedure: Chest port  Indication: Colorectal malignancy  Findings: Please refer to procedural dictation for full description.  Complications: None  EBL: < 10 mL  Miachel Roux, MD [phone]

## 2020-09-13 NOTE — H&P (Signed)
Chief Complaint: Patient was seen in consultation today for colorectal cancer at the request of Brahmanday,Govinda R  Referring Physician(s): Cammie Sickle  Supervising Physician: Mir, Sharen Heck  Patient Status: [facility] - Out-pt  History of Present Illness: Albert Giles is a 75 y.o. male with recent diagnosis of colorectal cancer who has been by Oncology on 8/29 with request received for image guided port a catheter placement by IR for chemotherapy. The patient has had a H&P performed within the last 30 days, all history, medications, and exam have been reviewed. The patient denies any interval changes since the H&P. He denies any chest pain, shortness of breath or palpitations. He denies any recent fever or chills. The patient denies any history of sleep apnea or chronic oxygen use. He has previously tolerated sedation without complications.    Past Medical History:  Diagnosis Date   CAD (coronary artery disease) 06/2014   UA with NSTEMI - cath with 99% prox L circ s/p stent, EF 40% (Fath, [place] at [facility])   Emphysema    mild   Ex-smoker    NSTEMI (non-ST elevated myocardial infarction) ([place]) 07/13/2014    Past Surgical History:  Procedure Laterality Date   CARDIAC CATHETERIZATION N/A 07/14/2014   Left Heart Cath and Coronary Angiography with stent placement;  Surgeon: Teodoro Spray, MD   CARDIAC CATHETERIZATION N/A 07/14/2014   Coronary Stent Intervention;  Surgeon: Yolonda Kida, MD   COLONOSCOPY WITH PROPOFOL N/A 09/06/2020   Procedure: COLONOSCOPY WITH PROPOFOL;  Surgeon: Lin Landsman, MD;  Location: [place];  Service: Gastroenterology;  Laterality: N/A;   CYSTECTOMY     on back   ESOPHAGOGASTRODUODENOSCOPY N/A 09/06/2020   Procedure: ESOPHAGOGASTRODUODENOSCOPY (EGD);  Surgeon: Lin Landsman, MD;  Location: [facility] ENDOSCOPY;  Service: Gastroenterology;  Laterality: N/A;   INGUINAL HERNIA REPAIR Right 08/17/04    Allergies: Patient has no known  allergies.  Medications: Prior to Admission medications   Medication Sig Start Date End Date Taking? Authorizing Provider  aspirin EC 81 MG tablet Take 1 tablet (81 mg total) by mouth daily. 07/15/14  Yes Henreitta Leber, MD  atorvastatin (LIPITOR) 40 MG tablet Take 1 tablet (40 mg total) by mouth daily at 6 PM. 07/15/14  Yes Sainani, Belia Heman, MD  Cholecalciferol (VITAMIN D3) 25 MCG (1000 UT) CAPS Take 1 capsule (1,000 Units total) by mouth daily. 09/13/19  Yes [name], MD  clopidogrel (PLAVIX) 75 MG tablet Take 1 tablet (75 mg total) by mouth daily. 07/15/14  Yes Sainani, Belia Heman, MD  Cyanocobalamin (B-12) 1000 MCG SUBL Place 1 tablet under the tongue daily. 09/13/19  Yes [name], MD  ferrous sulfate 324 (65 Fe) MG TBEC Take 1 tablet (325 mg total) by mouth every other day. 06/21/20 12/18/20 Yes [name], MD  metoprolol tartrate (LOPRESSOR) 25 MG tablet Take 1 tablet (25 mg total) by mouth 2 (two) times daily. 07/15/14  Yes Henreitta Leber, MD  pantoprazole (PROTONIX) 40 MG tablet Take 1 tablet (40 mg total) by mouth 2 (two) times daily before a meal. 09/06/20 12/05/20 Yes Vanga, Tally Due, MD  tadalafil (CIALIS) 20 MG tablet TAKE ONE TABLET BY MOUTH DAILY AS NEEDED FOR ERECTILE DYSFUNCTION 03/23/20   Nori Riis, PA-C     Family History  Problem Relation Age of Onset   Cancer Mother 77       colon   Cancer Sister        breast   Crohn's disease Sister  Cancer Daughter        anal   Crohn's disease Niece    CAD Neg Hx    Stroke Neg Hx    Diabetes Neg Hx    Prostate cancer Neg Hx    Kidney cancer Neg Hx    Bladder Cancer Neg Hx     Social History   Socioeconomic History   Marital status: Married    Spouse name: Not on file   Number of children: Not on file   Years of education: Not on file   Highest education level: Not on file  Occupational History   Not on file  Tobacco Use   Smoking status: Former    Packs/day: 1.00    Years: 35.00     Pack years: 35.00    Types: Cigarettes    Quit date: 07/07/1998    Years since quitting: 22.2   Smokeless tobacco: Never  Vaping Use   Vaping Use: Never used  Substance and Sexual Activity   Alcohol use: No   Drug use: No   Sexual activity: Not on file  Other Topics Concern   Not on file  Social History Narrative   Caffeine: 2 cups coffee, 2 cups tea/day   Lives with wife   Occupation: Higher education careers adviser, retired   Edu: HS   Activity: works in yard, walking   Diet: some water, fruits/vegetables daily   -------------------------------------------------------------------       [employer] [place]; [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    Social Determinants of Health   Financial Resource Strain: Low Risk    Difficulty of Paying Living Expenses: Not hard at all  Food Insecurity: No Food Insecurity   Worried About Charity fundraiser in the Last Year: Never true   [place] in the Last Year: Never true  Transportation Needs: No Transportation Needs   Lack of Transportation (Medical): No   Lack of Transportation (Non-Medical): No  Physical Activity: Inactive   Days of Exercise per Week: 0 days   Minutes of Exercise per Session: 0 min  Stress: No Stress Concern Present   Feeling of Stress : Not at all  Social Connections: Not on file   Review of Systems: A 12 point ROS discussed and pertinent positives are indicated in the HPI above.  All other systems are negative.  Review of Systems  Vital Signs: BP 140/80   Pulse 65   Temp 97.8 F (36.6 C)   Resp 16   Ht '5\' 8"'$  (1.727 m)   Wt 157 lb (71.2 kg)   SpO2 100%   BMI 23.87 kg/m   Physical Exam Constitutional:      Appearance: Normal appearance.  HENT:     Head: Normocephalic and atraumatic.  Cardiovascular:     Rate and Rhythm: Normal rate and regular rhythm.  Pulmonary:     Effort: Pulmonary effort is normal. No respiratory distress.     Breath  sounds: Normal breath sounds.  Skin:    General: Skin is warm and dry.  Neurological:     Mental Status: He is alert and oriented to person, place, and time.    Imaging: No results found.  Labs:  CBC: Recent Labs    06/14/20 0806 09/11/20 1519  WBC 9.1 9.7  HGB 9.7* 9.8*  HCT 31.6* 33.8*  PLT 313.0 324    COAGS: No results for input(s): INR, APTT in the last 8760  hours.  BMP: Recent Labs    06/14/20 0806 09/11/20 1511  NA 139 137  K 4.2 3.4*  CL 106 103  CO2 24 23  GLUCOSE 101* 104*  BUN 16 11  CALCIUM 9.5 8.6*  CREATININE 1.77* 1.38*  GFRNONAA  --  53*    LIVER FUNCTION TESTS: Recent Labs    06/14/20 0806 09/11/20 1511  BILITOT 0.4 0.6  AST 11 16  ALT 8 14  ALKPHOS 114 103  PROT 6.5 7.7  ALBUMIN 4.1 3.9    Assessment and Plan: Colorectal cancer Seen by Oncology 8/29 with no medical changes since Request received for image guided port a catheter placement by IR for chemotherapy  CAD with NSTEMI s/p stent, on plavix and ASA The patient has been NPO, labs and vitals have been reviewed.  Risks and benefits of image guided port-a-catheter placement was discussed with the patient including, but not limited to bleeding, infection, pneumothorax, or fibrin sheath development and need for additional procedures.  All of the patient's questions were answered, patient is agreeable to proceed. Consent signed and in chart.   Thank you for this interesting consult.  I greatly enjoyed meeting KUMAR MESLER and look forward to participating in their care.  A copy of this report was sent to the requesting provider on this date.  Electronically Signed: Hedy Jacob, PA-C 09/13/2020, 9:30 AM   I spent a total of 15 Minutes in face to face in clinical consultation, greater than 50% of which was counseling/coordinating care for colorectal cancer.

## 2020-09-13 NOTE — Progress Notes (Signed)
Patient clinically stable post Port placement per Dr Dwaine Gale, tolerated well. Denies complaints post procedure. Awake/alert and oriented post procedure. Received Versed 1 mg along with Fentanyl 50 mcg IV for procedure. Report given to [place] post procedure/specials.

## 2020-09-13 NOTE — Progress Notes (Signed)
Addendum,post procedure note at 1125

## 2020-09-14 ENCOUNTER — Telehealth: Payer: Self-pay | Admitting: *Deleted

## 2020-09-14 ENCOUNTER — Other Ambulatory Visit: Payer: Self-pay | Admitting: *Deleted

## 2020-09-14 ENCOUNTER — Other Ambulatory Visit: Payer: Medicare HMO

## 2020-09-14 DIAGNOSIS — R110 Nausea: Secondary | ICD-10-CM

## 2020-09-14 DIAGNOSIS — T451X5A Adverse effect of antineoplastic and immunosuppressive drugs, initial encounter: Secondary | ICD-10-CM

## 2020-09-14 MED ORDER — PROCHLORPERAZINE MALEATE 10 MG PO TABS
10.0000 mg | ORAL_TABLET | Freq: Four times a day (QID) | ORAL | 3 refills | Status: DC | PRN
Start: 2020-09-14 — End: 2021-10-08

## 2020-09-14 MED ORDER — LIDOCAINE-PRILOCAINE 2.5-2.5 % EX CREA: 1.0000 "application " | TOPICAL_CREAM | CUTANEOUS | 3 refills | Status: DC | PRN

## 2020-09-14 MED ORDER — ONDANSETRON HCL 8 MG PO TABS: 8.0000 mg | ORAL_TABLET | Freq: Three times a day (TID) | ORAL | 3 refills | Status: DC | PRN

## 2020-09-14 NOTE — Telephone Encounter (Signed)
This request has been approved.    Please note any additional information provided by [employer] Part D at the bottom of your screen.

## 2020-09-14 NOTE — Progress Notes (Signed)
Tumor Board Documentation  KOLSEN CHOE was presented by Dr Rogue Bussing at our Tumor Board on 09/14/2020, which included representatives from medical oncology, radiation oncology, internal medicine, navigation, pathology, radiology, surgical, pharmacy, genetics, research, palliative care, pulmonology.  Lathyn currently presents as a new patient, for [place], for new positive pathology with history of the following treatments: active survellience, surgical intervention(s).  Additionally, we reviewed previous medical and familial history, history of present illness, and recent lab results along with all available histopathologic and imaging studies. The tumor board considered available treatment options and made the following recommendations: Additional screening (PET and MRI, MSI on Pathology) Neo adjuvant chemotherapy followed by Neoadjuvant Chemo Radiation [place] followed by surgery. Refer to Genetics  The following procedures/referrals were also placed: No orders of the defined types were placed in this encounter.   Clinical Trial Status: not discussed   Staging used: To be determined AJCC Staging:       Group: 2 Primary tumors Ivasvive Adenocarcinoma of Rectum and Intramucosal Adenocarcinoma of Transverse Colon   National site-specific guidelines   were discussed with respect to the case.  Tumor board is a meeting of clinicians from various specialty areas who evaluate and discuss patients for whom a multidisciplinary approach is being considered. Final determinations in the plan of care are those of the provider(s). The responsibility for follow up of recommendations given during tumor board is that of the provider.   Today's extended care, comprehensive team conference, Zimri was not present for the discussion and was not examined.   Multidisciplinary Tumor Board is a multidisciplinary case peer review process.  Decisions discussed in the Multidisciplinary Tumor Board reflect the opinions  of the specialists present at the conference without having examined the patient.  Ultimately, treatment and diagnostic decisions rest with the primary provider(s) and the patient.

## 2020-09-14 NOTE — Telephone Encounter (Signed)
[place]: [id number] - PA Case ID: [id number] - Rx #[id number] [place] submitted for emla cream

## 2020-09-14 NOTE — Telephone Encounter (Signed)
Your information has been submitted to [employer]. [employer] will review the request and will issue a decision, typically within 1-3 days from your submission. You can check the updated outcome later by reopening this request.  If [employer] has not responded in 1-3 days or if you have any questions about your ePA request, please contact [employer] at [phone]. If you think there may be a problem with your PA request, use our live chat feature at the bottom right.

## 2020-09-15 ENCOUNTER — Telehealth: Payer: Self-pay

## 2020-09-15 ENCOUNTER — Ambulatory Visit
Admission: RE | Admit: 2020-09-15 | Discharge: 2020-09-15 | Disposition: A | Discharge status: Home / Self Care | Payer: Medicare HMO | Source: Ambulatory Visit | Attending: Internal Medicine | Admitting: Internal Medicine

## 2020-09-15 ENCOUNTER — Other Ambulatory Visit: Payer: Self-pay

## 2020-09-15 DIAGNOSIS — C20 Malignant neoplasm of rectum: Secondary | ICD-10-CM | POA: Insufficient documentation

## 2020-09-15 DIAGNOSIS — D492 Neoplasm of unspecified behavior of bone, soft tissue, and skin: Secondary | ICD-10-CM | POA: Diagnosis not present

## 2020-09-15 NOTE — Telephone Encounter (Signed)
Request for MSI per Dr. Rogue Bussing sent to pathology.

## 2020-09-19 ENCOUNTER — Inpatient Hospital Stay: Payer: Medicare HMO

## 2020-09-20 LAB — SURGICAL PATHOLOGY

## 2020-09-21 ENCOUNTER — Other Ambulatory Visit: Payer: Self-pay

## 2020-09-21 ENCOUNTER — Ambulatory Visit
Admission: RE | Admit: 2020-09-21 | Discharge: 2020-09-21 | Disposition: A | Discharge status: Home / Self Care | Payer: Medicare HMO | Source: Ambulatory Visit | Attending: Internal Medicine | Admitting: Internal Medicine

## 2020-09-21 DIAGNOSIS — I700 Atherosclerosis of aorta: Secondary | ICD-10-CM | POA: Diagnosis not present

## 2020-09-21 DIAGNOSIS — C20 Malignant neoplasm of rectum: Secondary | ICD-10-CM | POA: Insufficient documentation

## 2020-09-21 DIAGNOSIS — R918 Other nonspecific abnormal finding of lung field: Secondary | ICD-10-CM | POA: Insufficient documentation

## 2020-09-21 DIAGNOSIS — K769 Liver disease, unspecified: Secondary | ICD-10-CM | POA: Diagnosis not present

## 2020-09-21 DIAGNOSIS — K6389 Other specified diseases of intestine: Secondary | ICD-10-CM | POA: Diagnosis not present

## 2020-09-21 DIAGNOSIS — N281 Cyst of kidney, acquired: Secondary | ICD-10-CM | POA: Insufficient documentation

## 2020-09-21 DIAGNOSIS — K6289 Other specified diseases of anus and rectum: Secondary | ICD-10-CM | POA: Diagnosis not present

## 2020-09-21 DIAGNOSIS — C189 Malignant neoplasm of colon, unspecified: Secondary | ICD-10-CM | POA: Diagnosis not present

## 2020-09-21 LAB — GLUCOSE, CAPILLARY: Glucose-Capillary: 92 mg/dL (ref 70–99)

## 2020-09-21 MED ORDER — FLUDEOXYGLUCOSE F - 18 (FDG) INJECTION
8.6500 | Freq: Once | INTRAVENOUS | Status: AC | PRN
  Administered 2020-09-21: 8.65 via INTRAVENOUS

## 2020-09-22 ENCOUNTER — Inpatient Hospital Stay: Payer: Medicare HMO | Attending: Internal Medicine

## 2020-09-22 DIAGNOSIS — Z808 Family history of malignant neoplasm of other organs or systems: Secondary | ICD-10-CM | POA: Insufficient documentation

## 2020-09-22 DIAGNOSIS — K922 Gastrointestinal hemorrhage, unspecified: Secondary | ICD-10-CM | POA: Insufficient documentation

## 2020-09-22 DIAGNOSIS — D509 Iron deficiency anemia, unspecified: Secondary | ICD-10-CM | POA: Insufficient documentation

## 2020-09-22 DIAGNOSIS — I252 Old myocardial infarction: Secondary | ICD-10-CM | POA: Insufficient documentation

## 2020-09-22 DIAGNOSIS — I700 Atherosclerosis of aorta: Secondary | ICD-10-CM | POA: Insufficient documentation

## 2020-09-22 DIAGNOSIS — Z803 Family history of malignant neoplasm of breast: Secondary | ICD-10-CM | POA: Insufficient documentation

## 2020-09-22 DIAGNOSIS — K449 Diaphragmatic hernia without obstruction or gangrene: Secondary | ICD-10-CM | POA: Insufficient documentation

## 2020-09-22 DIAGNOSIS — Z8379 Family history of other diseases of the digestive system: Secondary | ICD-10-CM | POA: Insufficient documentation

## 2020-09-22 DIAGNOSIS — Z87891 Personal history of nicotine dependence: Secondary | ICD-10-CM | POA: Insufficient documentation

## 2020-09-22 DIAGNOSIS — Z800 Family history of malignant neoplasm of digestive organs: Secondary | ICD-10-CM | POA: Insufficient documentation

## 2020-09-22 DIAGNOSIS — C19 Malignant neoplasm of rectosigmoid junction: Secondary | ICD-10-CM | POA: Insufficient documentation

## 2020-09-22 DIAGNOSIS — J432 Centrilobular emphysema: Secondary | ICD-10-CM | POA: Insufficient documentation

## 2020-09-22 DIAGNOSIS — Z79899 Other long term (current) drug therapy: Secondary | ICD-10-CM | POA: Insufficient documentation

## 2020-09-22 DIAGNOSIS — N281 Cyst of kidney, acquired: Secondary | ICD-10-CM | POA: Insufficient documentation

## 2020-09-22 DIAGNOSIS — I2510 Atherosclerotic heart disease of native coronary artery without angina pectoris: Secondary | ICD-10-CM | POA: Insufficient documentation

## 2020-09-22 DIAGNOSIS — N183 Chronic kidney disease, stage 3 unspecified: Secondary | ICD-10-CM | POA: Insufficient documentation

## 2020-09-25 ENCOUNTER — Encounter: Payer: Self-pay | Admitting: Internal Medicine

## 2020-09-25 ENCOUNTER — Other Ambulatory Visit: Payer: Self-pay

## 2020-09-25 ENCOUNTER — Inpatient Hospital Stay: Payer: Medicare HMO

## 2020-09-25 ENCOUNTER — Inpatient Hospital Stay (HOSPITAL_BASED_OUTPATIENT_CLINIC_OR_DEPARTMENT_OTHER): Payer: Medicare HMO | Admitting: Internal Medicine

## 2020-09-25 DIAGNOSIS — C20 Malignant neoplasm of rectum: Secondary | ICD-10-CM

## 2020-09-25 DIAGNOSIS — I2510 Atherosclerotic heart disease of native coronary artery without angina pectoris: Secondary | ICD-10-CM | POA: Diagnosis not present

## 2020-09-25 DIAGNOSIS — I252 Old myocardial infarction: Secondary | ICD-10-CM | POA: Diagnosis not present

## 2020-09-25 DIAGNOSIS — N281 Cyst of kidney, acquired: Secondary | ICD-10-CM | POA: Diagnosis not present

## 2020-09-25 DIAGNOSIS — Z808 Family history of malignant neoplasm of other organs or systems: Secondary | ICD-10-CM | POA: Diagnosis not present

## 2020-09-25 DIAGNOSIS — D509 Iron deficiency anemia, unspecified: Secondary | ICD-10-CM | POA: Diagnosis not present

## 2020-09-25 DIAGNOSIS — K449 Diaphragmatic hernia without obstruction or gangrene: Secondary | ICD-10-CM | POA: Diagnosis not present

## 2020-09-25 DIAGNOSIS — C19 Malignant neoplasm of rectosigmoid junction: Secondary | ICD-10-CM | POA: Diagnosis not present

## 2020-09-25 DIAGNOSIS — Z87891 Personal history of nicotine dependence: Secondary | ICD-10-CM | POA: Diagnosis not present

## 2020-09-25 DIAGNOSIS — Z8379 Family history of other diseases of the digestive system: Secondary | ICD-10-CM | POA: Diagnosis not present

## 2020-09-25 DIAGNOSIS — I700 Atherosclerosis of aorta: Secondary | ICD-10-CM | POA: Diagnosis not present

## 2020-09-25 DIAGNOSIS — J432 Centrilobular emphysema: Secondary | ICD-10-CM | POA: Diagnosis not present

## 2020-09-25 DIAGNOSIS — Z803 Family history of malignant neoplasm of breast: Secondary | ICD-10-CM | POA: Diagnosis not present

## 2020-09-25 DIAGNOSIS — K922 Gastrointestinal hemorrhage, unspecified: Secondary | ICD-10-CM | POA: Diagnosis not present

## 2020-09-25 DIAGNOSIS — Z800 Family history of malignant neoplasm of digestive organs: Secondary | ICD-10-CM | POA: Diagnosis not present

## 2020-09-25 DIAGNOSIS — Z79899 Other long term (current) drug therapy: Secondary | ICD-10-CM | POA: Diagnosis not present

## 2020-09-25 DIAGNOSIS — N183 Chronic kidney disease, stage 3 unspecified: Secondary | ICD-10-CM | POA: Diagnosis not present

## 2020-09-25 LAB — CBC WITH DIFFERENTIAL/PLATELET
Abs Immature Granulocytes: 0.04 10*3/uL (ref 0.00–0.07)
Basophils Absolute: 0.1 10*3/uL (ref 0.0–0.1)
Basophils Relative: 1 %
Eosinophils Absolute: 0.2 10*3/uL (ref 0.0–0.5)
Eosinophils Relative: 2 %
HCT: 32.9 % — ABNORMAL LOW (ref 39.0–52.0)
Hemoglobin: 9.6 g/dL — ABNORMAL LOW (ref 13.0–17.0)
Immature Granulocytes: 0 %
Lymphocytes Relative: 12 %
Lymphs Abs: 1.1 10*3/uL (ref 0.7–4.0)
MCH: 23 pg — ABNORMAL LOW (ref 26.0–34.0)
MCHC: 29.2 g/dL — ABNORMAL LOW (ref 30.0–36.0)
MCV: 78.7 fL — ABNORMAL LOW (ref 80.0–100.0)
Monocytes Absolute: 1 10*3/uL (ref 0.1–1.0)
Monocytes Relative: 10 %
Neutro Abs: 7.3 10*3/uL (ref 1.7–7.7)
Neutrophils Relative %: 75 %
Platelets: 350 10*3/uL (ref 150–400)
RBC: 4.18 MIL/uL — ABNORMAL LOW (ref 4.22–5.81)
RDW: 18.3 % — ABNORMAL HIGH (ref 11.5–15.5)
WBC: 9.8 10*3/uL (ref 4.0–10.5)
nRBC: 0 % (ref 0.0–0.2)

## 2020-09-25 LAB — COMPREHENSIVE METABOLIC PANEL
ALT: 13 U/L (ref 0–44)
AST: 16 U/L (ref 15–41)
Albumin: 3.5 g/dL (ref 3.5–5.0)
Alkaline Phosphatase: 122 U/L (ref 38–126)
Anion gap: 6 (ref 5–15)
BUN: 10 mg/dL (ref 8–23)
CO2: 25 mmol/L (ref 22–32)
Calcium: 8.5 mg/dL — ABNORMAL LOW (ref 8.9–10.3)
Chloride: 104 mmol/L (ref 98–111)
Creatinine, Ser: 1.48 mg/dL — ABNORMAL HIGH (ref 0.61–1.24)
GFR, Estimated: 48 mL/min — ABNORMAL LOW (ref >60)
Glucose, Bld: 101 mg/dL — ABNORMAL HIGH (ref 70–99)
Potassium: 4.1 mmol/L (ref 3.5–5.1)
Sodium: 135 mmol/L (ref 135–145)
Total Bilirubin: 0.7 mg/dL (ref 0.3–1.2)
Total Protein: 6.7 g/dL (ref 6.5–8.1)

## 2020-09-25 MED ORDER — PALONOSETRON HCL INJECTION 0.25 MG/5ML
0.2500 mg | Freq: Once | INTRAVENOUS | Status: AC
  Administered 2020-09-25: 0.25 mg via INTRAVENOUS
  Filled 2020-09-25: qty 5

## 2020-09-25 MED ORDER — FLUOROURACIL CHEMO INJECTION 2.5 GM/50ML
400.0000 mg/m2 | Freq: Once | INTRAVENOUS | Status: AC
  Administered 2020-09-25: 800 mg via INTRAVENOUS
  Filled 2020-09-25: qty 16

## 2020-09-25 MED ORDER — SODIUM CHLORIDE 0.9 % IV SOLN
10.0000 mg | Freq: Once | INTRAVENOUS | Status: AC
  Administered 2020-09-25: 10 mg via INTRAVENOUS
  Filled 2020-09-25: qty 10

## 2020-09-25 MED ORDER — DEXTROSE 5 % IV SOLN
Freq: Once | INTRAVENOUS | Status: AC
  Filled 2020-09-25: qty 250

## 2020-09-25 MED ORDER — OXALIPLATIN CHEMO INJECTION 100 MG/20ML
85.0000 mg/m2 | Freq: Once | INTRAVENOUS | Status: AC
  Administered 2020-09-25: 165 mg via INTRAVENOUS
  Filled 2020-09-25: qty 33

## 2020-09-25 MED ORDER — LEUCOVORIN CALCIUM INJECTION 350 MG
412.0000 mg/m2 | Freq: Once | INTRAVENOUS | Status: AC
  Administered 2020-09-25: 800 mg via INTRAVENOUS
  Filled 2020-09-25: qty 40

## 2020-09-25 MED ORDER — SODIUM CHLORIDE 0.9 % IV SOLN
2400.0000 mg/m2 | INTRAVENOUS | Status: DC
  Administered 2020-09-25: 4650 mg via INTRAVENOUS
  Filled 2020-09-25: qty 93

## 2020-09-25 NOTE — Progress Notes (Signed)
Patient here today for follow up and initiation of his first treatment. No complaints at this time.

## 2020-09-25 NOTE — Progress Notes (Signed)
[facility] NOTE  Patient Care Team: [name], MD as PCP - General (Family Medicine) Clent Jacks, RN as Oncology Nurse Navigator  CHIEF COMPLAINTS/PURPOSE OF CONSULTATION: colon/rectal cancer  #  Oncology History Overview Note  # Malignant partially obstructing tumor in the transverse colon/70 cm proximal to the anus- C. COLON MASS, 70 CM; COLD BIOPSY:  - INTRAMUCOSAL ADENOCARCINOMA AT LEAST;  # One 20 mm polyp in the transverse colon, removed with mucosal resection. Resected and retrieved. Clips were placed. Tattooed.  # tumor in the mid rectum and at 10 cm proximal to the anus. Biopsied.      SEE COMMENT.   Comment:  There is no definitive evidence of invasion in this sample.  The  findings may not accurately represent the entire underlying lesion;  clinical correlation is recommended.   D. COLON POLYP, TRANSVERSE; HOT SNARE:  - TUBULOVILLOUS ADENOMA.  - NEGATIVE FOR HIGH GRADE DYSPLASIA AND MALIGNANCY.   E. RECTUM MASS; COLD BIOPSY:  - INVASIVE ADENOCARCINOMA, MODERATELY TO POORLY DIFFERENTIATED. ----------------------------------   # PET scan: SEP 2022-proximal right colon [adjacent mesenteric lymph nodes] and rectal hypermetabolism.  Additional subcentimeter bilateral hypermetabolic lymphadenopathy noted in the retroperitoneal/left external iliac; inferior right lobe of the liver concerning for metastatic disease; right lower quadrant soft tissue mass concerning for peritoneal carcinomatosis; bilateral solid pulmonary nodules ~5 mm; MRI rectum- T stage: T4a; N stage:  N1.   # 09/12-2020- FOLFOX chemo [Dr.White/Dr.Vanga]   Rectal cancer ([place])  09/11/2020 Initial Diagnosis   Rectal cancer ([place])   09/25/2020 -  Chemotherapy    Patient is on Treatment Plan: COLORECTAL FOLFOX Q14D X 4 MONTHS          HISTORY OF PRESENTING ILLNESS: Ambulating independently.  Accompanied by his daughter. Albert Giles 75 y.o.  male i synchronous  colon cancer/rectal cancer-is here today with results of his PET scan/rectal MRI; with chemotherapy.  Patient denies any nausea vomiting abdominal pain.  No fevers or chills.   Review of Systems  Constitutional:  Negative for chills, diaphoresis, fever and weight loss.  HENT:  Negative for nosebleeds and sore throat.   Eyes:  Negative for double vision.  Respiratory:  Negative for cough, hemoptysis, sputum production, shortness of breath and wheezing.   Cardiovascular:  Negative for chest pain, palpitations, orthopnea and leg swelling.  Gastrointestinal:  Negative for abdominal pain, blood in stool, constipation, diarrhea, heartburn, melena, nausea and vomiting.  Genitourinary:  Negative for dysuria, frequency and urgency.  Skin: Negative.  Negative for itching and rash.  Neurological:  Negative for dizziness, tingling, focal weakness, weakness and headaches.  Endo/Heme/Allergies:  Does not bruise/bleed easily.  Psychiatric/Behavioral:  Negative for depression. The patient is not nervous/anxious and does not have insomnia.     MEDICAL HISTORY:  Past Medical History:  Diagnosis Date   CAD (coronary artery disease) 06/2014   UA with NSTEMI - cath with 99% prox L circ s/p stent, EF 40% ([place], [place] at [facility])   Emphysema    mild   Ex-smoker    NSTEMI (non-ST elevated myocardial infarction) ([place]) 07/13/2014    SURGICAL HISTORY: Past Surgical History:  Procedure Laterality Date   CARDIAC CATHETERIZATION N/A 07/14/2014   Left Heart Cath and Coronary Angiography with stent placement;  Surgeon: Teodoro Spray, MD   CARDIAC CATHETERIZATION N/A 07/14/2014   Coronary Stent Intervention;  Surgeon: Yolonda Kida, MD   COLONOSCOPY WITH PROPOFOL N/A 09/06/2020   Procedure: COLONOSCOPY WITH PROPOFOL;  Surgeon: Marius Ditch,  Tally Due, MD;  Location: [facility];  Service: Gastroenterology;  Laterality: N/A;   CYSTECTOMY     on back   ESOPHAGOGASTRODUODENOSCOPY N/A 09/06/2020   Procedure:  ESOPHAGOGASTRODUODENOSCOPY (EGD);  Surgeon: Lin Landsman, MD;  Location: [facility] ENDOSCOPY;  Service: Gastroenterology;  Laterality: N/A;   INGUINAL HERNIA REPAIR Right 08/17/04   IR IMAGING GUIDED PORT INSERTION  09/13/2020    SOCIAL HISTORY: Social History   Socioeconomic History   Marital status: Married    Spouse name: Not on file   Number of children: Not on file   Years of education: Not on file   Highest education level: Not on file  Occupational History   Not on file  Tobacco Use   Smoking status: Former    Packs/day: 1.00    Years: 35.00    Pack years: 35.00    Types: Cigarettes    Quit date: 07/07/1998    Years since quitting: 22.2   Smokeless tobacco: Never  Vaping Use   Vaping Use: Never used  Substance and Sexual Activity   Alcohol use: No   Drug use: No   Sexual activity: Not on file  Other Topics Concern   Not on file  Social History Narrative   Caffeine: 2 cups coffee, 2 cups tea/day   Lives with wife   Occupation: Higher education careers adviser, retired   Edu: HS   Activity: works in yard, walking   Diet: some water, fruits/vegetables daily   -------------------------------------------------------------------       Shea Stakes ; [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    Social Determinants of Health   Financial Resource Strain: Low Risk    Difficulty of Paying Living Expenses: Not hard at all  Food Insecurity: No Food Insecurity   Worried About Charity fundraiser in the Last Year: Never true   [place] in the Last Year: Never true  Transportation Needs: No Transportation Needs   Lack of Transportation (Medical): No   Lack of Transportation (Non-Medical): No  Physical Activity: Inactive   Days of Exercise per Week: 0 days   Minutes of Exercise per Session: 0 min  Stress: No Stress Concern Present   Feeling of Stress : Not at all  Social Connections: Not on file  Intimate Partner  Violence: Not At Risk   Fear of Current or Ex-Partner: No   Emotionally Abused: No   Physically Abused: No   Sexually Abused: No    FAMILY HISTORY: Family History  Problem Relation Age of Onset   Cancer Mother 8       colon   Cancer Sister        breast   Crohn's disease Sister    Cancer Daughter        anal   Crohn's disease Niece    CAD Neg Hx    Stroke Neg Hx    Diabetes Neg Hx    Prostate cancer Neg Hx    Kidney cancer Neg Hx    Bladder Cancer Neg Hx     ALLERGIES:  has No Known Allergies.  MEDICATIONS:  Current Outpatient Medications  Medication Sig Dispense Refill   aspirin EC 81 MG tablet Take 1 tablet (81 mg total) by mouth daily. 60 tablet 1   atorvastatin (LIPITOR) 40 MG tablet Take 1 tablet (40 mg total) by mouth daily at 6 PM. 60 tablet 1   Cholecalciferol (VITAMIN D3) 25 MCG (1000 UT)  CAPS Take 1 capsule (1,000 Units total) by mouth daily. 30 capsule    clopidogrel (PLAVIX) 75 MG tablet Take 1 tablet (75 mg total) by mouth daily. 60 tablet 1   Cyanocobalamin (B-12) 1000 MCG SUBL Place 1 tablet under the tongue daily.     ferrous sulfate 324 (65 Fe) MG TBEC Take 1 tablet (325 mg total) by mouth every other day.     lidocaine-prilocaine (EMLA) cream Apply 1 application topically as needed (to port a cath 1 hour prior to each chemotherapy). 30 g 3   metoprolol tartrate (LOPRESSOR) 25 MG tablet Take 1 tablet (25 mg total) by mouth 2 (two) times daily. 60 tablet 1   pantoprazole (PROTONIX) 40 MG tablet Take 1 tablet (40 mg total) by mouth 2 (two) times daily before a meal. 180 tablet 1   tadalafil (CIALIS) 20 MG tablet TAKE ONE TABLET BY MOUTH DAILY AS NEEDED FOR ERECTILE DYSFUNCTION 20 tablet 3   ondansetron (ZOFRAN) 8 MG tablet Take 1 tablet (8 mg total) by mouth every 8 (eight) hours as needed for nausea or vomiting. (Patient not taking: Reported on 09/25/2020) 20 tablet 3   prochlorperazine (COMPAZINE) 10 MG tablet Take 1 tablet (10 mg total) by mouth every 6  (six) hours as needed for nausea or vomiting. (Patient not taking: Reported on 09/25/2020) 30 tablet 3   No current facility-administered medications for this visit.      Marland Kitchen  PHYSICAL EXAMINATION: ECOG PERFORMANCE STATUS: 0 - Asymptomatic  Vitals:   09/25/20 1003  BP: 128/71  Pulse: 60  Resp: 18  Temp: 97.8 F (36.6 C)  SpO2: 100%   Filed Weights   09/25/20 1003  Weight: 167 lb (75.8 kg)    Physical Exam Vitals and nursing note reviewed.  HENT:     Head: Normocephalic and atraumatic.     Mouth/Throat:     Pharynx: Oropharynx is clear.  Eyes:     Extraocular Movements: Extraocular movements intact.     Pupils: Pupils are equal, round, and reactive to light.  Cardiovascular:     Rate and Rhythm: Normal rate and regular rhythm.  Pulmonary:     Comments: Decreased breath sounds bilaterally.  Abdominal:     Palpations: Abdomen is soft.  Musculoskeletal:        General: Normal range of motion.     Cervical back: Normal range of motion.  Skin:    General: Skin is warm.  Neurological:     General: No focal deficit present.     Mental Status: He is alert and oriented to person, place, and time.  Psychiatric:        Behavior: Behavior normal.        Judgment: Judgment normal.     LABORATORY DATA:  I have reviewed the data as listed Lab Results  Component Value Date   WBC 9.8 09/25/2020   HGB 9.6 (L) 09/25/2020   HCT 32.9 (L) 09/25/2020   MCV 78.7 (L) 09/25/2020   PLT 350 09/25/2020   Recent Labs    06/14/20 0806 09/11/20 1511 09/25/20 0918  NA 139 137 135  K 4.2 3.4* 4.1  CL 106 103 104  CO2 '24 23 25  '$ GLUCOSE 101* 104* 101*  BUN '16 11 10  '$ CREATININE 1.77* 1.38* 1.48*  CALCIUM 9.5 8.6* 8.5*  GFRNONAA  --  53* 48*  PROT 6.5 7.7 6.7  ALBUMIN 4.1 3.9 3.5  AST '11 16 16  '$ ALT '8 14 13  '$ ALKPHOS 114 103  122  BILITOT 0.4 0.6 0.7    RADIOGRAPHIC STUDIES: I have personally reviewed the radiological images as listed and agreed with the findings in the  report. MR PELVIS WO CONTRAST  Result Date: 09/15/2020 CLINICAL DATA:  Rectal carcinoma. EXAM: MRI PELVIS WITHOUT CONTRAST TECHNIQUE: Multiplanar multisequence MR imaging of the pelvis was performed. No intravenous contrast was administered. Ultrasound gel was administered per rectum to optimize tumor evaluation. COMPARISON:  None. FINDINGS: TUMOR LOCATION Tumor distance from Anal Verge/Skin Surface: 11.1 cm Tumor distance from Internal Anal Sphincter: 7.9 cm TUMOR DESCRIPTION Circumferential Extent: 5-9 o'clock positions Tumor Length: 3.7 cm T - CATEGORY Extension through Muscularis Propria: Yes, measuring up to 14 mm mm along the right lateral wall =T3 Shortest Distance from Mass to Mesorectal Fascia: 0 mm Extramural Vascular Invasion/Tumor Thrombus: No Invasion of Anterior Peritoneal Reflection: Yes, best demonstrated on images 12-15/series 3 = T4a Involvement of Adjacent Organs or Pelvic Sidewall: No Levator Ani Involvement: No N - CATEGORY Regional Lymph Nodes >=67m: 1 measuring 6 mm along the right lateral rectal wall (image 15/10) =N1a Other:  None. IMPRESSION: Rectal adenocarcinoma T stage: T4a Rectal adenocarcinoma N stage:  N1 Distance from tumor to the internal anal sphincter is 7.9 cm. Electronically Signed   By: JMarlaine HindM.D.   On: 09/15/2020 16:28   NM PET Image Initial (PI) Skull Base To Thigh (F-18 FDG)  Result Date: 09/22/2020 CLINICAL DATA:  Initial treatment strategy for colorectal cancer. EXAM: NUCLEAR MEDICINE PET SKULL BASE TO THIGH TECHNIQUE: 8.65 mCi F-18 FDG was injected intravenously. Full-ring PET imaging was performed from the skull base to thigh after the radiotracer. CT data was obtained and used for attenuation correction and anatomic localization. Fasting blood glucose: 92 mg/dl COMPARISON:  None. FINDINGS: Mediastinal blood pool activity: SUV max 1.9 Liver activity: SUV max NA NECK: No hypermetabolic lymph nodes in the neck. Incidental CT findings: none CHEST: Right lower  paratracheal lymph node measuring 8 mm short axis located on series 3, image 98 with SUV max of 2.7, slightly above mediastinal blood pool, possibly reactive. Otherwise, no FDG avid lymph node seen in the chest. Incidental CT findings: Three-vessel coronary artery calcifications atherosclerotic disease of the thoracic aorta. Small hiatal hernia. Upper lobe predominant centrilobular emphysema scattered solid pulmonary nodules. Reference solid nodule of the left upper lobe measuring 5 mm on series 3, image 115. Reference solid nodule of the left upper lobe measuring 5 mm on series 3, image 123. Bibasilar atelectasis. ABDOMEN/PELVIS: Ill-defined FDG avid lesion of the inferior right hepatic lobe (segment 6) measuring approximately 1.0 cm on series 3, image 169 with an SUV max of 4.8. Hypermetabolic retroperitoneal lymph nodes. Reference left periaortic lymph node measures 7 mm in short axis on series 3, image 176 with an SUV max of 5.3. Hypermetabolic left external iliac lymph node measuring 7 mm in short axis on image 231 with an SUV max of 3.7. FDG avid mesenteric lymph nodes of the right right lower quadrant. Reference right lower quadrant mesenteric lymph node measuring 8 mm in short axis on image 207 with an SUV max of 2.1. Left lower quadrant soft tissue mass measuring 3.0 x 2.3 cm on image 183 with an SUV max of 5.3. Focal wall thickening of the proximal right colon with marked FDG uptake with SUV max of 11.8. Focal wall thickening of the rectum with SUV max of 11.5. Incidental CT findings: Exophytic simple cyst of the right kidney. Atherosclerotic disease of the abdominal aorta SKELETON: No focal  hypermetabolic activity to suggest skeletal metastasis. Incidental CT findings: none IMPRESSION: Focal wall thickening of the proximal right colon and rectum with hypermetabolic activity. Hypermetabolic mesenteric lymph nodes are seen adjacent to right colonic wall thickening. Additional hypermetabolic retroperitoneal  and left external iliac lymph nodes are seen. Lesion of the inferior right lobe of the liver with hypermetabolic activity, concerning for hepatic metastatic disease. Right lower quadrant soft tissue mass with hypermetabolic activity, concerning for peritoneal carcinomatosis. Small bilateral solid pulmonary nodules with no FDG uptake, possibly due to small size. Pulmonary metastases can not be excluded. Recommend attention on follow-up. Electronically Signed   By: Yetta Glassman M.D.   On: 09/22/2020 12:40   IR IMAGING GUIDED PORT INSERTION  Result Date: 09/13/2020 INDICATION: Colorectal malignancy EXAM: IMPLANTED PORT A CATH PLACEMENT WITH ULTRASOUND AND FLUOROSCOPIC GUIDANCE MEDICATIONS: None ANESTHESIA/SEDATION: Moderate (conscious) sedation was employed during this procedure. A total of Versed 1 mg and Fentanyl 50 mcg was administered intravenously. Moderate Sedation Time: 13 minutes. The patient's level of consciousness and vital signs were monitored continuously by radiology nursing throughout the procedure under my direct supervision. FLUOROSCOPY TIME:  0 minutes, 36 seconds (5.7 mGy) COMPLICATIONS: None immediate. PROCEDURE: The procedure, risks, benefits, and alternatives were explained to the patient. Questions regarding the procedure were encouraged and answered. The patient understands and consents to the procedure. A timeout was performed prior to the initiation of the procedure. Patient positioned supine on the angiography table. Right neck and anterior upper chest prepped and draped in the usual sterile fashion. All elements of maximal sterile barrier were utilized including, cap, mask, sterile gown, sterile gloves, large sterile drape, hand scrubbing and 2% Chlorhexidine for skin cleaning. The right internal jugular vein was evaluated with ultrasound and shown to be patent. A permanent ultrasound image was obtained and placed in the patient's medical record. Local anesthesia was provided with 1%  lidocaine with epinephrine. Using sterile gel and a sterile probe cover, the right internal jugular vein was entered with a 21 ga needle during real time ultrasound guidance. 0.018 inch guidewire placed and 21 ga needle exchanged for transitional dilator set. Utilizing fluoroscopy, 0.035 inch guidewire advanced through the needle without difficulty. Attention then turned to the right anterior upper chest. Following local lidocaine administration, a port pocket was created. The catheter was connected to the port and brought from the pocket to the venotomy site through a subcutaneous tunnel. The catheter was cut to size and inserted through the peel-away sheath. The catheter tip was positioned at the cavoatrial junction using fluoroscopic guidance. The port aspirated and flushed well. The port pocket was closed with deep and superficial absorbable suture. The port pocket incision and venotomy sites were also sealed with Dermabond. IMPRESSION: Successful placement of a right internal jugular approach power injectable Port-A-Cath. The catheter is ready for immediate use. Electronically Signed   By: Miachel Roux M.D.   On: 09/13/2020 13:12    ASSESSMENT & PLAN:   Rectal cancer ([place])  #Synchronous primaries a] rectal cancer-10cm-adenocarcinoma & b] transverse colon- 70cm- intramucosal adenoca] from anus. PET scan: SEP 2022-proximal right colon [adjacent mesenteric lymph nodes] and rectal hypermetabolism.  Additional subcentimeter bilateral hypermetabolic lymphadenopathy noted in the retroperitoneal/left external iliac; inferior right lobe of the liver concerning for metastatic disease; right lower quadrant soft tissue mass concerning for peritoneal carcinomatosis; bilateral solid pulmonary nodules ~5 mm; MRI rectum- T stage: T4a; N stage:  N1.    #I reviewed the concerning findings noted on the PET scan the patient  and his daughter in detail.  We will review the imaging at the tumor conference.  Also await surgical  evaluation as planned on 9/13; with D.White.  Discussed regarding consideration for liver MRI; will await for now.  #We will proceed with FOLFOX chemotherapy cycle 1 today. Labs today reviewed;  acceptable for treatment today.  Again reviewed the potential side effects including but Nausea vomiting and diarrhea; and tingling and numbness/cold sensitivity with oxaliplatin.  # Anemia/iron deficiency hemoglobin ~9; secondary to chronic GI bleeding/tumor.  Proceed with Venofer; day 3/pump of  #Chronic kidney disease stage III-monitor closely.  Will callLenna [id number] # DISPOSITION:  # FOLFOX chemo today; pump off in 48 hours/venofer # # follow up in 2 weeks- MD: labs- cbc/cmp' FOLFOX; pump off in 2 days; Venofer--Dr.B  # I reviewed the blood work- with the patient in detail; also reviewed the imaging independently [as summarized above]; and with the patient in detail.   # 40 minutes face-to-face with the patient discussing the above plan of care; more than 50% of time spent on prognosis/ natural history; counseling and coordination.  All questions were answered. The patient knows to call the clinic with any problems, questions or concerns.    Cammie Sickle, MD 09/26/2020 12:21 AM

## 2020-09-25 NOTE — Assessment & Plan Note (Addendum)
#  Synchronous primaries a] rectal cancer-10cm-adenocarcinoma & b] transverse colon- 70cm- intramucosal adenoca] from anus. PET scan: SEP 2022-proximal right colon [adjacent mesenteric lymph nodes] and rectal hypermetabolism.  Additional subcentimeter bilateral hypermetabolic lymphadenopathy noted in the retroperitoneal/left external iliac; inferior right lobe of the liver concerning for metastatic disease; right lower quadrant soft tissue mass concerning for peritoneal carcinomatosis; bilateral solid pulmonary nodules ~5 mm; MRI rectum- T stage: T4a; N stage:  N1.   #I reviewed the concerning findings noted on the PET scan the patient and his daughter in detail.  We will review the imaging at the tumor conference.  Also await surgical evaluation as planned on 9/13; with D.White.  Discussed regarding consideration for liver MRI; will await for now.  #We will proceed with FOLFOX chemotherapy cycle 1 today. Labs today reviewed;  acceptable for treatment today.  Again reviewed the potential side effects including but Nausea vomiting and diarrhea; and tingling and numbness/cold sensitivity with oxaliplatin.  # Anemia/iron deficiency hemoglobin ~9; secondary to chronic GI bleeding/tumor.  Proceed with Venofer; day 3/pump of  #Chronic kidney disease stage III-monitor closely.  Will callLenna [id number] # DISPOSITION:  # FOLFOX chemo today; pump off in 48 hours/venofer # # follow up in 2 weeks- MD: labs- cbc/cmp' FOLFOX; pump off in 2 days; Venofer--Dr.B  # I reviewed the blood work- with the patient in detail; also reviewed the imaging independently [as summarized above]; and with the patient in detail.   # 40 minutes face-to-face with the patient discussing the above plan of care; more than 50% of time spent on prognosis/ natural history; counseling and coordination.

## 2020-09-25 NOTE — Patient Instructions (Signed)
[facility]  Discharge Instructions: Thank you for choosing [facility] to provide your oncology and hematology care.  If you have a lab appointment with the [facility], please go directly to the [facility] and check in at the registration area.  Wear comfortable clothing and clothing appropriate for easy access to any Portacath or PICC line.   We strive to give you quality time with your provider. You may need to reschedule your appointment if you arrive late (15 or more minutes).  Arriving late affects you and other patients whose appointments are after yours.  Also, if you miss three or more appointments without notifying the office, you may be dismissed from the clinic at the provider's discretion.      For prescription refill requests, have your pharmacy contact our office and allow 72 hours for refills to be completed.    Today you received the following chemotherapy and/or immunotherapy agents FOLFOX The chemotherapy medication bag should finish at 46 hours, 96 hours, or 7 days. For example, if your pump is scheduled for 46 hours and it was put on at 4:00 p.m., it should finish at 2:00 p.m. the day it is scheduled to come off regardless of your appointment time.     Estimated time to finish at 12:15pm .   If the display on your pump reads "Low Volume" and it is beeping, take the batteries out of the pump and come to the cancer center for it to be taken off.   If the pump alarms go off prior to the pump reading "Low Volume" then call [phone] and someone can assist you.  If the plunger comes out and the chemotherapy medication is leaking out, please use your home chemo spill kit to clean up the spill. Do NOT use paper towels or other household products.  If you have problems or questions regarding your pump, please call either [phone] (24 hours a day) or the cancer center Monday-Friday 8:00 a.m.- 4:30 p.m. at the clinic  number and we will assist you. If you are unable to get assistance, then go to the nearest Emergency Department and ask the staff to contact the IV team for assistance.  12:15pm       To help prevent nausea and vomiting after your treatment, we encourage you to take your nausea medication as directed.  BELOW ARE SYMPTOMS THAT SHOULD BE REPORTED IMMEDIATELY: *FEVER GREATER THAN 100.4 F (38 C) OR HIGHER *CHILLS OR SWEATING *NAUSEA AND VOMITING THAT IS NOT CONTROLLED WITH YOUR NAUSEA MEDICATION *UNUSUAL SHORTNESS OF BREATH *UNUSUAL BRUISING OR BLEEDING *URINARY PROBLEMS (pain or burning when urinating, or frequent urination) *BOWEL PROBLEMS (unusual diarrhea, constipation, pain near the anus) TENDERNESS IN MOUTH AND THROAT WITH OR WITHOUT PRESENCE OF ULCERS (sore throat, sores in mouth, or a toothache) UNUSUAL RASH, SWELLING OR PAIN  UNUSUAL VAGINAL DISCHARGE OR ITCHING   Items with * indicate a potential emergency and should be followed up as soon as possible or go to the Emergency Department if any problems should occur.  Please show the CHEMOTHERAPY ALERT CARD or IMMUNOTHERAPY ALERT CARD at check-in to the Emergency Department and triage nurse.  Should you have questions after your visit or need to cancel or reschedule your appointment, please contact [facility]  [phone] and follow the prompts.  Office hours are 8:00 a.m. to 4:30 p.m. Monday - Friday. Please note that voicemails left after 4:00 p.m. may not be returned  until the following business day.  We are closed weekends and major holidays. You have access to a nurse at all times for urgent questions. Please call the main number to the clinic [phone] and follow the prompts.  For any non-urgent questions, you may also contact your provider using MyChart. We now offer e-Visits for anyone 36 and older to request care online for non-urgent symptoms. For details visit [email or web address].    Also download the MyChart app! Go to the app store, search "MyChart", open the app, select [facility], and log in with your MyChart username and password.  Due to Covid, a mask is required upon entering the hospital/clinic. If you do not have a mask, one will be given to you upon arrival. For doctor visits, patients may have 1 support person aged 92 or older with them. For treatment visits, patients cannot have anyone with them due to current Covid guidelines and our immunocompromised population. Fluorouracil, 5-FU injection What is this medication? FLUOROURACIL, 5-FU (flure oh YOOR a sil) is a chemotherapy drug. It slows the growth of cancer cells. This medicine is used to treat many types of cancer like breast cancer, colon or rectal cancer, pancreatic cancer, and stomach cancer. This medicine may be used for other purposes; ask your health care provider or pharmacist if you have questions. COMMON BRAND NAME(S): Adrucil What should I tell my care team before I take this medication? They need to know if you have any of these conditions: blood disorders dihydropyrimidine dehydrogenase (DPD) deficiency infection (especially a virus infection such as chickenpox, cold sores, or herpes) kidney disease liver disease malnourished, poor nutrition recent or ongoing radiation therapy an unusual or allergic reaction to fluorouracil, other chemotherapy, other medicines, foods, dyes, or preservatives pregnant or trying to get pregnant breast-feeding How should I use this medication? This drug is given as an infusion or injection into a vein. It is administered in a hospital or clinic by a specially trained health care professional. Talk to your pediatrician regarding the use of this medicine in children. Special care may be needed. Overdosage: If you think you have taken too much of this medicine contact a poison control center or emergency room at once. NOTE: This medicine is only for you. Do not  share this medicine with others. What if I miss a dose? It is important not to miss your dose. Call your doctor or health care professional if you are unable to keep an appointment. What may interact with this medication? Do not take this medicine with any of the following medications: live virus vaccines This medicine may also interact with the following medications: medicines that treat or prevent blood clots like warfarin, enoxaparin, and dalteparin This list may not describe all possible interactions. Give your health care provider a list of all the medicines, herbs, non-prescription drugs, or dietary supplements you use. Also tell them if you smoke, drink alcohol, or use illegal drugs. Some items may interact with your medicine. What should I watch for while using this medication? Visit your doctor for checks on your progress. This drug may make you feel generally unwell. This is not uncommon, as chemotherapy can affect healthy cells as well as cancer cells. Report any side effects. Continue your course of treatment even though you feel ill unless your doctor tells you to stop. In some cases, you may be given additional medicines to help with side effects. Follow all directions for their use. Call your doctor or health care professional for advice if  you get a fever, chills or sore throat, or other symptoms of a cold or flu. Do not treat yourself. This drug decreases your body's ability to fight infections. Try to avoid being around people who are sick. This medicine may increase your risk to bruise or bleed. Call your doctor or health care professional if you notice any unusual bleeding. Be careful brushing and flossing your teeth or using a toothpick because you may get an infection or bleed more easily. If you have any dental work done, tell your dentist you are receiving this medicine. Avoid taking products that contain aspirin, acetaminophen, ibuprofen, naproxen, or ketoprofen unless  instructed by your doctor. These medicines may hide a fever. Do not become pregnant while taking this medicine. Women should inform their doctor if they wish to become pregnant or think they might be pregnant. There is a potential for serious side effects to an unborn child. Talk to your health care professional or pharmacist for more information. Do not breast-feed an infant while taking this medicine. Men should inform their doctor if they wish to father a child. This medicine may lower sperm counts. Do not treat diarrhea with over the counter products. Contact your doctor if you have diarrhea that lasts more than 2 days or if it is severe and watery. This medicine can make you more sensitive to the sun. Keep out of the sun. If you cannot avoid being in the sun, wear protective clothing and use sunscreen. Do not use sun lamps or tanning beds/booths. What side effects may I notice from receiving this medication? Side effects that you should report to your doctor or health care professional as soon as possible: allergic reactions like skin rash, itching or hives, swelling of the face, lips, or tongue low blood counts - this medicine may decrease the number of white blood cells, red blood cells and platelets. You may be at increased risk for infections and bleeding. signs of infection - fever or chills, cough, sore throat, pain or difficulty passing urine signs of decreased platelets or bleeding - bruising, pinpoint red spots on the skin, black, tarry stools, blood in the urine signs of decreased red blood cells - unusually weak or tired, fainting spells, lightheadedness breathing problems changes in vision chest pain mouth sores nausea and vomiting pain, swelling, redness at site where injected pain, tingling, numbness in the hands or feet redness, swelling, or sores on hands or feet stomach pain unusual bleeding Side effects that usually do not require medical attention (report to your doctor  or health care professional if they continue or are bothersome): changes in finger or toe nails diarrhea dry or itchy skin hair loss headache loss of appetite sensitivity of eyes to the light stomach upset unusually teary eyes This list may not describe all possible side effects. Call your doctor for medical advice about side effects. You may report side effects to FDA at 1-800-FDA-1088. Where should I keep my medication? This drug is given in a hospital or clinic and will not be stored at home. NOTE: This sheet is a summary. It may not cover all possible information. If you have questions about this medicine, talk to your doctor, pharmacist, or health care provider.  2022 Elsevier/Gold Standard (2018-12-01 15:00:03) Leucovorin injection What is this medication? LEUCOVORIN (loo koe VOR in) is used to prevent or treat the harmful effects of some medicines. This medicine is used to treat anemia caused by a low amount of folic acid in the body. It is  also used with 5-fluorouracil (5-FU) to treat colon cancer. This medicine may be used for other purposes; ask your health care provider or pharmacist if you have questions. What should I tell my care team before I take this medication? They need to know if you have any of these conditions: anemia from low levels of vitamin B-12 in the blood an unusual or allergic reaction to leucovorin, folic acid, other medicines, foods, dyes, or preservatives pregnant or trying to get pregnant breast-feeding How should I use this medication? This medicine is for injection into a muscle or into a vein. It is given by a health care professional in a hospital or clinic setting. Talk to your pediatrician regarding the use of this medicine in children. Special care may be needed. Overdosage: If you think you have taken too much of this medicine contact a poison control center or emergency room at once. NOTE: This medicine is only for you. Do not share this  medicine with others. What if I miss a dose? This does not apply. What may interact with this medication? capecitabine fluorouracil phenobarbital phenytoin primidone trimethoprim-sulfamethoxazole This list may not describe all possible interactions. Give your health care provider a list of all the medicines, herbs, non-prescription drugs, or dietary supplements you use. Also tell them if you smoke, drink alcohol, or use illegal drugs. Some items may interact with your medicine. What should I watch for while using this medication? Your condition will be monitored carefully while you are receiving this medicine. This medicine may increase the side effects of 5-fluorouracil, 5-FU. Tell your doctor or health care professional if you have diarrhea or mouth sores that do not get better or that get worse. What side effects may I notice from receiving this medication? Side effects that you should report to your doctor or health care professional as soon as possible: allergic reactions like skin rash, itching or hives, swelling of the face, lips, or tongue breathing problems fever, infection mouth sores unusual bleeding or bruising unusually weak or tired Side effects that usually do not require medical attention (report to your doctor or health care professional if they continue or are bothersome): constipation or diarrhea loss of appetite nausea, vomiting This list may not describe all possible side effects. Call your doctor for medical advice about side effects. You may report side effects to FDA at 1-800-FDA-1088. Where should I keep my medication? This drug is given in a hospital or clinic and will not be stored at home. NOTE: This sheet is a summary. It may not cover all possible information. If you have questions about this medicine, talk to your doctor, pharmacist, or health care provider.  2022 Elsevier/Gold Standard (2007-07-07 16:50:29) Oxaliplatin Injection What is this  medication? OXALIPLATIN (ox AL i PLA tin) is a chemotherapy drug. It targets fast dividing cells, like cancer cells, and causes these cells to die. This medicine is used to treat cancers of the colon and rectum, and many other cancers. This medicine may be used for other purposes; ask your health care provider or pharmacist if you have questions. COMMON BRAND NAME(S): Eloxatin What should I tell my care team before I take this medication? They need to know if you have any of these conditions: heart disease history of irregular heartbeat liver disease low blood counts, like white cells, platelets, or red blood cells lung or breathing disease, like asthma take medicines that treat or prevent blood clots tingling of the fingers or toes, or other nerve disorder an unusual  or allergic reaction to oxaliplatin, other chemotherapy, other medicines, foods, dyes, or preservatives pregnant or trying to get pregnant breast-feeding How should I use this medication? This drug is given as an infusion into a vein. It is administered in a hospital or clinic by a specially trained health care professional. Talk to your pediatrician regarding the use of this medicine in children. Special care may be needed. Overdosage: If you think you have taken too much of this medicine contact a poison control center or emergency room at once. NOTE: This medicine is only for you. Do not share this medicine with others. What if I miss a dose? It is important not to miss a dose. Call your doctor or health care professional if you are unable to keep an appointment. What may interact with this medication? Do not take this medicine with any of the following medications: cisapride dronedarone pimozide thioridazine This medicine may also interact with the following medications: aspirin and aspirin-like medicines certain medicines that treat or prevent blood clots like warfarin, apixaban, dabigatran, and  rivaroxaban cisplatin cyclosporine diuretics medicines for infection like acyclovir, adefovir, amphotericin B, bacitracin, cidofovir, foscarnet, ganciclovir, gentamicin, pentamidine, vancomycin NSAIDs, medicines for pain and inflammation, like ibuprofen or naproxen other medicines that prolong the QT interval (an abnormal heart rhythm) pamidronate zoledronic acid This list may not describe all possible interactions. Give your health care provider a list of all the medicines, herbs, non-prescription drugs, or dietary supplements you use. Also tell them if you smoke, drink alcohol, or use illegal drugs. Some items may interact with your medicine. What should I watch for while using this medication? Your condition will be monitored carefully while you are receiving this medicine. You may need blood work done while you are taking this medicine. This medicine may make you feel generally unwell. This is not uncommon as chemotherapy can affect healthy cells as well as cancer cells. Report any side effects. Continue your course of treatment even though you feel ill unless your healthcare professional tells you to stop. This medicine can make you more sensitive to cold. Do not drink cold drinks or use ice. Cover exposed skin before coming in contact with cold temperatures or cold objects. When out in cold weather wear warm clothing and cover your mouth and nose to warm the air that goes into your lungs. Tell your doctor if you get sensitive to the cold. Do not become pregnant while taking this medicine or for 9 months after stopping it. Women should inform their health care professional if they wish to become pregnant or think they might be pregnant. Men should not father a child while taking this medicine and for 6 months after stopping it. There is potential for serious side effects to an unborn child. Talk to your health care professional for more information. Do not breast-feed a child while taking this  medicine or for 3 months after stopping it. This medicine has caused ovarian failure in some women. This medicine may make it more difficult to get pregnant. Talk to your health care professional if you are concerned about your fertility. This medicine has caused decreased sperm counts in some men. This may make it more difficult to father a child. Talk to your health care professional if you are concerned about your fertility. This medicine may increase your risk of getting an infection. Call your health care professional for advice if you get a fever, chills, or sore throat, or other symptoms of a cold or flu. Do not  treat yourself. Try to avoid being around people who are sick. Avoid taking medicines that contain aspirin, acetaminophen, ibuprofen, naproxen, or ketoprofen unless instructed by your health care professional. These medicines may hide a fever. Be careful brushing or flossing your teeth or using a toothpick because you may get an infection or bleed more easily. If you have any dental work done, tell your dentist you are receiving this medicine. What side effects may I notice from receiving this medication? Side effects that you should report to your doctor or health care professional as soon as possible: allergic reactions like skin rash, itching or hives, swelling of the face, lips, or tongue breathing problems cough low blood counts - this medicine may decrease the number of white blood cells, red blood cells, and platelets. You may be at increased risk for infections and bleeding nausea, vomiting pain, redness, or irritation at site where injected pain, tingling, numbness in the hands or feet signs and symptoms of bleeding such as bloody or black, tarry stools; red or dark brown urine; spitting up blood or brown material that looks like coffee grounds; red spots on the skin; unusual bruising or bleeding from the eyes, gums, or nose signs and symptoms of a dangerous change in  heartbeat or heart rhythm like chest pain; dizziness; fast, irregular heartbeat; palpitations; feeling faint or lightheaded; falls signs and symptoms of infection like fever; chills; cough; sore throat; pain or trouble passing urine signs and symptoms of liver injury like dark yellow or brown urine; general ill feeling or flu-like symptoms; light-colored stools; loss of appetite; nausea; right upper belly pain; unusually weak or tired; yellowing of the eyes or skin signs and symptoms of low red blood cells or anemia such as unusually weak or tired; feeling faint or lightheaded; falls signs and symptoms of muscle injury like dark urine; trouble passing urine or change in the amount of urine; unusually weak or tired; muscle pain; back pain Side effects that usually do not require medical attention (report to your doctor or health care professional if they continue or are bothersome): changes in taste diarrhea gas hair loss loss of appetite mouth sores This list may not describe all possible side effects. Call your doctor for medical advice about side effects. You may report side effects to FDA at 1-800-FDA-1088. Where should I keep my medication? This drug is given in a hospital or clinic and will not be stored at home. NOTE: This sheet is a summary. It may not cover all possible information. If you have questions about this medicine, talk to your doctor, pharmacist, or health care provider.  2022 Elsevier/Gold Standard (2018-05-20 12:20:35)

## 2020-09-26 ENCOUNTER — Encounter: Payer: Self-pay | Admitting: Internal Medicine

## 2020-09-26 DIAGNOSIS — C772 Secondary and unspecified malignant neoplasm of intra-abdominal lymph nodes: Secondary | ICD-10-CM | POA: Diagnosis not present

## 2020-09-26 DIAGNOSIS — C786 Secondary malignant neoplasm of retroperitoneum and peritoneum: Secondary | ICD-10-CM | POA: Diagnosis not present

## 2020-09-26 DIAGNOSIS — C20 Malignant neoplasm of rectum: Secondary | ICD-10-CM | POA: Diagnosis not present

## 2020-09-26 DIAGNOSIS — C182 Malignant neoplasm of ascending colon: Secondary | ICD-10-CM | POA: Diagnosis not present

## 2020-09-27 ENCOUNTER — Inpatient Hospital Stay: Payer: Medicare HMO | Attending: Internal Medicine

## 2020-09-27 ENCOUNTER — Telehealth: Payer: Self-pay

## 2020-09-27 VITALS — BP 111/65 | HR 60 | Temp 97.8°F | Resp 18

## 2020-09-27 DIAGNOSIS — C20 Malignant neoplasm of rectum: Secondary | ICD-10-CM | POA: Insufficient documentation

## 2020-09-27 DIAGNOSIS — Z79899 Other long term (current) drug therapy: Secondary | ICD-10-CM | POA: Diagnosis not present

## 2020-09-27 DIAGNOSIS — Z5111 Encounter for antineoplastic chemotherapy: Secondary | ICD-10-CM | POA: Diagnosis not present

## 2020-09-27 DIAGNOSIS — D500 Iron deficiency anemia secondary to blood loss (chronic): Secondary | ICD-10-CM

## 2020-09-27 MED ORDER — SODIUM CHLORIDE 0.9 % IV SOLN
Freq: Once | INTRAVENOUS | Status: AC
  Filled 2020-09-27: qty 250

## 2020-09-27 MED ORDER — IRON SUCROSE 20 MG/ML IV SOLN
200.0000 mg | Freq: Once | INTRAVENOUS | Status: AC
  Administered 2020-09-27: 200 mg via INTRAVENOUS
  Filled 2020-09-27: qty 10

## 2020-09-27 MED ORDER — SODIUM CHLORIDE 0.9 % IV SOLN: 200.0000 mg | Freq: Once | INTRAVENOUS | Status: DC

## 2020-09-27 MED ORDER — SODIUM CHLORIDE 0.9% FLUSH
10.0000 mL | INTRAVENOUS | Status: DC | PRN
Start: 2020-09-27 — End: 2020-09-27
  Filled 2020-09-27: qty 10

## 2020-09-27 MED ORDER — HEPARIN SOD (PORK) LOCK FLUSH 100 UNIT/ML IV SOLN
500.0000 [IU] | Freq: Once | INTRAVENOUS | Status: AC | PRN
  Administered 2020-09-27: 500 [IU]
  Filled 2020-09-27: qty 5

## 2020-09-27 NOTE — Telephone Encounter (Signed)
Telephone call to patient for follow up after receiving first infusion.   Patient states infusion went great.  States eating good and drinking plenty of fluids.   Denies any nausea or vomiting.  Encouraged patient to call for any concerns or questions. 

## 2020-09-28 ENCOUNTER — Other Ambulatory Visit: Payer: Medicare HMO

## 2020-09-28 ENCOUNTER — Telehealth: Payer: Self-pay | Admitting: Internal Medicine

## 2020-09-28 ENCOUNTER — Other Ambulatory Visit: Payer: Self-pay | Admitting: Internal Medicine

## 2020-09-28 NOTE — Telephone Encounter (Signed)
On 9/15-I reviewed the imaging with the tumor conference-suggestive of stage IV disease.  Discussed with Dr. Curt Bears off any curative surgical options at this time.  Proceed with chemotherapy.   Discussed with the daughter that median survival is anywhere between 2 to 3 years.

## 2020-09-28 NOTE — Progress Notes (Signed)
Tumor Board Documentation  Albert Giles was presented by DrBrahmanday at our Tumor Board on 09/28/2020, which included representatives from medical oncology, radiation oncology, internal medicine, navigation, pathology, radiology, surgical, pharmacy, genetics, nutrition, research, palliative care, pulmonology.  Albert Giles currently presents for discussion, as a current patient with history of the following treatments: active survellience, adjuvant chemotherapy.  Additionally, we reviewed previous medical and familial history, history of present illness, and recent lab results along with all available histopathologic and imaging studies. The tumor board considered available treatment options and made the following recommendations: Chemotherapy (Agressive Chemo followed by possible Surgery)    The following procedures/referrals were also placed: No orders of the defined types were placed in this encounter.   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  Tumor board is a meeting of clinicians from various specialty areas who evaluate and discuss patients for whom a multidisciplinary approach is being considered. Final determinations in the plan of care are those of the provider(s). The responsibility for follow up of recommendations given during tumor board is that of the provider.   Today's extended care, comprehensive team conference, Albert Giles was not present for the discussion and was not examined.   Multidisciplinary Tumor Board is a multidisciplinary case peer review process.  Decisions discussed in the Multidisciplinary Tumor Board reflect the opinions of the specialists present at the conference without having examined the patient.  Ultimately, treatment and diagnostic decisions  rest with the primary provider(s) and the patient.

## 2020-09-29 ENCOUNTER — Other Ambulatory Visit: Payer: Self-pay | Admitting: *Deleted

## 2020-09-29 DIAGNOSIS — D500 Iron deficiency anemia secondary to blood loss (chronic): Secondary | ICD-10-CM

## 2020-10-04 ENCOUNTER — Other Ambulatory Visit: Payer: Self-pay

## 2020-10-04 ENCOUNTER — Inpatient Hospital Stay: Payer: Medicare HMO | Admitting: *Deleted

## 2020-10-04 DIAGNOSIS — C20 Malignant neoplasm of rectum: Secondary | ICD-10-CM

## 2020-10-04 NOTE — Progress Notes (Signed)
Multidisciplinary Oncology Council Documentation  Albert Giles was presented by our [facility] on 10/04/2020, which included representatives from:  Palliative Care Dietitian  Physical/Occupational Therapist Nurse Navigator Genetics Speech Therapist Survivorship RN Research RN  Albert Giles currently presents with history of rectal cancer.  We reviewed previous medical and familial history, history of present illness, and recent lab results along with all available histopathologic and imaging studies. The [facility] considered available treatment options and made the following recommendations/referrals:  -No recommendations at this time.  The MOC is a meeting of clinicians from various specialty areas who evaluate and discuss patients for whom a multidisciplinary approach is being considered. Final determinations in the plan of care are those of the provider(s).   Today's extended care, comprehensive team conference, Albert Giles was not present for the discussion and was not examined.

## 2020-10-04 NOTE — Progress Notes (Signed)
The proposed treatment discussed in conference is for discussion purpose only and is not a binding recommendation.  The patients have not been physically examined, or presented with their treatment options.  Therefore, final treatment plans cannot be decided.  

## 2020-10-06 NOTE — Telephone Encounter (Signed)
Spoke with patient regarding recent findings.  He asks about flu and covid shots - I recommended he get both during a week off chemo.

## 2020-10-09 ENCOUNTER — Inpatient Hospital Stay: Payer: Medicare HMO

## 2020-10-09 ENCOUNTER — Inpatient Hospital Stay (HOSPITAL_BASED_OUTPATIENT_CLINIC_OR_DEPARTMENT_OTHER): Payer: Medicare HMO | Admitting: Internal Medicine

## 2020-10-09 ENCOUNTER — Other Ambulatory Visit: Payer: Self-pay

## 2020-10-09 DIAGNOSIS — C20 Malignant neoplasm of rectum: Secondary | ICD-10-CM

## 2020-10-09 DIAGNOSIS — Z5111 Encounter for antineoplastic chemotherapy: Secondary | ICD-10-CM | POA: Diagnosis not present

## 2020-10-09 DIAGNOSIS — D500 Iron deficiency anemia secondary to blood loss (chronic): Secondary | ICD-10-CM

## 2020-10-09 DIAGNOSIS — Z79899 Other long term (current) drug therapy: Secondary | ICD-10-CM | POA: Diagnosis not present

## 2020-10-09 LAB — URINALYSIS, COMPLETE (UACMP) WITH MICROSCOPIC
Bacteria, UA: NONE SEEN
Bilirubin Urine: NEGATIVE
Glucose, UA: NEGATIVE mg/dL
Hgb urine dipstick: NEGATIVE
Ketones, ur: NEGATIVE mg/dL
Leukocytes,Ua: NEGATIVE
Nitrite: NEGATIVE
Protein, ur: NEGATIVE mg/dL
Specific Gravity, Urine: 1.017 (ref 1.005–1.030)
Squamous Epithelial / HPF: NONE SEEN (ref 0–5)
pH: 5 (ref 5.0–8.0)

## 2020-10-09 LAB — COMPREHENSIVE METABOLIC PANEL
ALT: 21 U/L (ref 0–44)
AST: 30 U/L (ref 15–41)
Albumin: 3.5 g/dL (ref 3.5–5.0)
Alkaline Phosphatase: 110 U/L (ref 38–126)
Anion gap: 8 (ref 5–15)
BUN: 13 mg/dL (ref 8–23)
CO2: 23 mmol/L (ref 22–32)
Calcium: 8.5 mg/dL — ABNORMAL LOW (ref 8.9–10.3)
Chloride: 107 mmol/L (ref 98–111)
Creatinine, Ser: 1.43 mg/dL — ABNORMAL HIGH (ref 0.61–1.24)
GFR, Estimated: 50 mL/min — ABNORMAL LOW (ref >60)
Glucose, Bld: 100 mg/dL — ABNORMAL HIGH (ref 70–99)
Potassium: 3.7 mmol/L (ref 3.5–5.1)
Sodium: 138 mmol/L (ref 135–145)
Total Bilirubin: 0.5 mg/dL (ref 0.3–1.2)
Total Protein: 6.4 g/dL — ABNORMAL LOW (ref 6.5–8.1)

## 2020-10-09 LAB — CBC WITH DIFFERENTIAL/PLATELET
Abs Immature Granulocytes: 0.01 10*3/uL (ref 0.00–0.07)
Basophils Absolute: 0.1 10*3/uL (ref 0.0–0.1)
Basophils Relative: 2 %
Eosinophils Absolute: 0.3 10*3/uL (ref 0.0–0.5)
Eosinophils Relative: 7 %
HCT: 32.7 % — ABNORMAL LOW (ref 39.0–52.0)
Hemoglobin: 9.6 g/dL — ABNORMAL LOW (ref 13.0–17.0)
Immature Granulocytes: 0 %
Lymphocytes Relative: 25 %
Lymphs Abs: 1.1 10*3/uL (ref 0.7–4.0)
MCH: 23.4 pg — ABNORMAL LOW (ref 26.0–34.0)
MCHC: 29.4 g/dL — ABNORMAL LOW (ref 30.0–36.0)
MCV: 79.8 fL — ABNORMAL LOW (ref 80.0–100.0)
Monocytes Absolute: 0.5 10*3/uL (ref 0.1–1.0)
Monocytes Relative: 11 %
Neutro Abs: 2.4 10*3/uL (ref 1.7–7.7)
Neutrophils Relative %: 55 %
Platelets: 170 10*3/uL (ref 150–400)
RBC: 4.1 MIL/uL — ABNORMAL LOW (ref 4.22–5.81)
RDW: 20.1 % — ABNORMAL HIGH (ref 11.5–15.5)
WBC: 4.3 10*3/uL (ref 4.0–10.5)
nRBC: 0 % (ref 0.0–0.2)

## 2020-10-09 MED ORDER — SODIUM CHLORIDE 0.9% FLUSH
10.0000 mL | INTRAVENOUS | Status: AC | PRN
  Administered 2020-10-09: 10 mL via INTRAVENOUS
  Filled 2020-10-09: qty 10

## 2020-10-09 MED ORDER — DEXTROSE 5 % IV SOLN
Freq: Once | INTRAVENOUS | Status: AC
  Filled 2020-10-09: qty 250

## 2020-10-09 MED ORDER — PALONOSETRON HCL INJECTION 0.25 MG/5ML
0.2500 mg | Freq: Once | INTRAVENOUS | Status: AC
  Administered 2020-10-09: 0.25 mg via INTRAVENOUS
  Filled 2020-10-09: qty 5

## 2020-10-09 MED ORDER — LEUCOVORIN CALCIUM INJECTION 350 MG
412.0000 mg/m2 | Freq: Once | INTRAVENOUS | Status: AC
  Administered 2020-10-09: 800 mg via INTRAVENOUS
  Filled 2020-10-09: qty 35

## 2020-10-09 MED ORDER — OXALIPLATIN CHEMO INJECTION 100 MG/20ML
85.0000 mg/m2 | Freq: Once | INTRAVENOUS | Status: AC
  Administered 2020-10-09: 165 mg via INTRAVENOUS
  Filled 2020-10-09: qty 33

## 2020-10-09 MED ORDER — SODIUM CHLORIDE 0.9 % IV SOLN
2400.0000 mg/m2 | INTRAVENOUS | Status: DC
  Administered 2020-10-09: 4650 mg via INTRAVENOUS
  Filled 2020-10-09: qty 93

## 2020-10-09 MED ORDER — SODIUM CHLORIDE 0.9 % IV SOLN
Freq: Once | INTRAVENOUS | Status: AC
  Filled 2020-10-09: qty 250

## 2020-10-09 MED ORDER — SODIUM CHLORIDE 0.9 % IV SOLN
10.0000 mg | Freq: Once | INTRAVENOUS | Status: AC
  Administered 2020-10-09: 10 mg via INTRAVENOUS
  Filled 2020-10-09: qty 10

## 2020-10-09 MED ORDER — FLUOROURACIL CHEMO INJECTION 2.5 GM/50ML
400.0000 mg/m2 | Freq: Once | INTRAVENOUS | Status: AC
  Administered 2020-10-09: 800 mg via INTRAVENOUS
  Filled 2020-10-09: qty 16

## 2020-10-09 MED ORDER — SODIUM CHLORIDE 0.9 % IV SOLN
10.0000 mg/kg | INTRAVENOUS | Status: DC
  Administered 2020-10-09: 800 mg via INTRAVENOUS
  Filled 2020-10-09: qty 32

## 2020-10-09 NOTE — Patient Instructions (Signed)
[facility] ONCOL[facility]  Discharge Instructions: Thank you for choosing [facility] to provide your oncology and hematology care.  If you have a lab appointment with the [facility], please go directly to the [facility] and check in at the registration area.  Wear comfortable clothing and clothing appropriate for easy access to any Portacath or PICC line.   We strive to give you quality time with your provider. You may need to reschedule your appointment if you arrive late (15 or more minutes).  Arriving late affects you and other patients whose appointments are after yours.  Also, if you miss three or more appointments without notifying the office, you may be dismissed from the clinic at the provider's discretion.      For prescription refill requests, have your pharmacy contact our office and allow 72 hours for refills to be completed.    Today you received the following chemotherapy and/or immunotherapy agents Avastin, Oxaliplatin, Adrucil    To help prevent nausea and vomiting after your treatment, we encourage you to take your nausea medication as directed.  BELOW ARE SYMPTOMS THAT SHOULD BE REPORTED IMMEDIATELY: *FEVER GREATER THAN 100.4 F (38 C) OR HIGHER *CHILLS OR SWEATING *NAUSEA AND VOMITING THAT IS NOT CONTROLLED WITH YOUR NAUSEA MEDICATION *UNUSUAL SHORTNESS OF BREATH *UNUSUAL BRUISING OR BLEEDING *URINARY PROBLEMS (pain or burning when urinating, or frequent urination) *BOWEL PROBLEMS (unusual diarrhea, constipation, pain near the anus) TENDERNESS IN MOUTH AND THROAT WITH OR WITHOUT PRESENCE OF ULCERS (sore throat, sores in mouth, or a toothache) UNUSUAL RASH, SWELLING OR PAIN  UNUSUAL VAGINAL DISCHARGE OR ITCHING   Items with * indicate a potential emergency and should be followed up as soon as possible or go to the Emergency Department if any problems should occur.  Please show the CHEMOTHERAPY ALERT CARD or IMMUNOTHERAPY ALERT  CARD at check-in to the Emergency Department and triage nurse.  Should you have questions after your visit or need to cancel or reschedule your appointment, please contact [facility]  [phone] and follow the prompts.  Office hours are 8:00 a.m. to 4:30 p.m. Monday - Friday. Please note that voicemails left after 4:00 p.m. may not be returned until the following business day.  We are closed weekends and major holidays. You have access to a nurse at all times for urgent questions. Please call the main number to the clinic [phone] and follow the prompts.  For any non-urgent questions, you may also contact your provider using MyChart. We now offer e-Visits for anyone 3 and older to request care online for non-urgent symptoms. For details visit [email or web address].   Also download the MyChart app! Go to the app store, search "MyChart", open the app, select [facility], and log in with your MyChart username and password.  Due to Covid, a mask is required upon entering the hospital/clinic. If you do not have a mask, one will be given to you upon arrival. For doctor visits, patients may have 1 support person aged 62 or older with them. For treatment visits, patients cannot have anyone with them due to current Covid guidelines and our immunocompromised population.

## 2020-10-09 NOTE — Progress Notes (Signed)
[facility] NOTE  Patient Care Team: [name], MD as PCP - General (Family Medicine) Clent Jacks, RN as Oncology Nurse Navigator  CHIEF COMPLAINTS/PURPOSE OF CONSULTATION: colon/rectal cancer  #  Oncology History Overview Note  # Malignant partially obstructing tumor in the transverse colon/70 cm proximal to the anus- C. COLON MASS, 70 CM; COLD BIOPSY:  - INTRAMUCOSAL ADENOCARCINOMA AT LEAST;  # One 20 mm polyp in the transverse colon, removed with mucosal resection. Resected and retrieved. Clips were placed. Tattooed.  # tumor in the mid rectum and at 10 cm proximal to the anus. Biopsied.      SEE COMMENT.   Comment:  There is no definitive evidence of invasion in this sample.  The  findings may not accurately represent the entire underlying lesion;  clinical correlation is recommended.   D. COLON POLYP, TRANSVERSE; HOT SNARE:  - TUBULOVILLOUS ADENOMA.  - NEGATIVE FOR HIGH GRADE DYSPLASIA AND MALIGNANCY.   E. RECTUM MASS; COLD BIOPSY:  - INVASIVE ADENOCARCINOMA, MODERATELY TO POORLY DIFFERENTIATED. ----------------------------------   # PET scan: SEP 2022-proximal right colon [adjacent mesenteric lymph nodes] and rectal hypermetabolism.  Additional subcentimeter bilateral hypermetabolic lymphadenopathy noted in the retroperitoneal/left external iliac; inferior right lobe of the liver concerning for metastatic disease; right lower quadrant soft tissue mass concerning for peritoneal carcinomatosis; bilateral solid pulmonary nodules ~5 mm; MRI rectum- T stage: T4a; N stage:  N1.   # 09/12-2020- FOLFOX chemo [Dr.White/Dr.Vanga]   Rectal cancer ([place])  09/11/2020 Initial Diagnosis   Rectal cancer ([facility])   09/25/2020 -  Chemotherapy   Patient is on Treatment Plan : COLORECTAL FOLFOX q14d x 4 months     09/28/2020 Cancer Staging   Staging form: Colon and Rectum, AJCC 8th Edition - Clinical: Stage IVC (cT4a, cN1, cM1c) - Signed by Cammie Sickle, MD on 09/28/2020      HISTORY OF PRESENTING ILLNESS: Ambulating independently.  Accompanied by his daughter. [name] 75 y.o.  male i synchronous colon cancer/rectal cancer-is here to proceed with chemotherapy.  Needed patient was evaluated by surgery.  Unfortunately felt not a surgical candidate given his omental disease.   Patient denies any nausea vomiting abdominal pain.  No fevers or chills.   Review of Systems  Constitutional:  Negative for chills, diaphoresis, fever and weight loss.  HENT:  Negative for nosebleeds and sore throat.   Eyes:  Negative for double vision.  Respiratory:  Negative for cough, hemoptysis, sputum production, shortness of breath and wheezing.   Cardiovascular:  Negative for chest pain, palpitations, orthopnea and leg swelling.  Gastrointestinal:  Negative for abdominal pain, blood in stool, constipation, diarrhea, heartburn, melena, nausea and vomiting.  Genitourinary:  Negative for dysuria, frequency and urgency.  Skin: Negative.  Negative for itching and rash.  Neurological:  Negative for dizziness, tingling, focal weakness, weakness and headaches.  Endo/Heme/Allergies:  Does not bruise/bleed easily.  Psychiatric/Behavioral:  Negative for depression. The patient is not nervous/anxious and does not have insomnia.     MEDICAL HISTORY:  Past Medical History:  Diagnosis Date  . CAD (coronary artery disease) 06/2014   UA with NSTEMI - cath with 99% prox L circ s/p stent, EF 40% ([place], [place] [facility])  . Emphysema    mild  . Ex-smoker   . Family history of breast cancer   . Family history of colon cancer   . Family history of pancreatic cancer   . NSTEMI (non-ST elevated myocardial infarction) ([place]) 07/13/2014  SURGICAL HISTORY: Past Surgical History:  Procedure Laterality Date  . CARDIAC CATHETERIZATION N/A 07/14/2014   Left Heart Cath and Coronary Angiography with stent placement;  Surgeon: Teodoro Spray, MD  . CARDIAC  CATHETERIZATION N/A 07/14/2014   Coronary Stent Intervention;  Surgeon: Yolonda Kida, MD  . COLONOSCOPY WITH PROPOFOL N/A 09/06/2020   Procedure: COLONOSCOPY WITH PROPOFOL;  Surgeon: Lin Landsman, MD;  [facility];  Service: Gastroenterology;  Laterality: N/A;  . CYSTECTOMY     on back  . ESOPHAGOGASTRODUODENOSCOPY N/A 09/06/2020   Procedure: ESOPHAGOGASTRODUODENOSCOPY (EGD);  Surgeon: Lin Landsman, MD;  Location: [facility] ENDOSCOPY;  Service: Gastroenterology;  Laterality: N/A;  . INGUINAL HERNIA REPAIR Right 08/17/04  . IR IMAGING GUIDED PORT INSERTION  09/13/2020    SOCIAL HISTORY: Social History   Socioeconomic History  . Marital status: Married    Spouse name: Not on file  . Number of children: Not on file  . Years of education: Not on file  . Highest education level: Not on file  Occupational History  . Not on file  Tobacco Use  . Smoking status: Former    Packs/day: 1.00    Years: 35.00    Pack years: 35.00    Types: Cigarettes    Quit date: 07/07/1998    Years since quitting: 22.3  . Smokeless tobacco: Never  Vaping Use  . Vaping Use: Never used  Substance and Sexual Activity  . Alcohol use: No  . Drug use: No  . Sexual activity: Not on file  Other Topics Concern  . 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    Social Determinants of Health   Financial Resource Strain: Low Risk   . Difficulty of Paying Living Expenses: Not hard at all  Food Insecurity: No Food Insecurity  . Worried About Charity fundraiser in the Last Year: Never true  . Ran Out of Food in  the Last Year: Never true  Transportation Needs: No Transportation Needs  . Lack of Transportation (Medical): No  . Lack of Transportation (Non-Medical): No  Physical Activity: Inactive  . Days of Exercise per Week: 0 days  . Minutes of Exercise per Session: 0 min  Stress: No Stress Concern Present  . Feeling of Stress : Not at all  Social Connections: Not on file  Intimate Partner Violence: Not At Risk  . Fear of Current or Ex-Partner: No  . Emotionally Abused: No  . Physically Abused: No  . Sexually Abused: No    FAMILY HISTORY: Family History  Problem Relation Age of Onset  . Cancer Mother 49       colon  . Cancer Sister        breast  . Crohn's disease Sister   . Cancer Daughter        anal  . Crohn's disease Niece   . CAD Neg Hx   . Stroke Neg Hx   . Diabetes Neg Hx   . Prostate cancer Neg Hx   . Kidney cancer Neg Hx   . Bladder Cancer Neg Hx     ALLERGIES:  has No Known Allergies.  MEDICATIONS:  Current Outpatient Medications  Medication Sig Dispense Refill  . aspirin EC 81 MG tablet Take 1 tablet (81 mg total) by mouth daily. 60 tablet 1  . atorvastatin (LIPITOR) 40 MG tablet Take 1 tablet (40 mg total) by mouth daily at 6 PM. 60 tablet 1  . Cholecalciferol (VITAMIN D3) 25 MCG (1000 UT) CAPS Take 1 capsule (1,000 Units total) by mouth daily. 30 capsule   . clopidogrel (PLAVIX) 75 MG tablet Take 1 tablet (75 mg total) by mouth daily. 60 tablet 1  . Cyanocobalamin (B-12) 1000 MCG SUBL Place 1 tablet under the tongue daily.    . ferrous sulfate 324 (65 Fe) MG TBEC Take 1 tablet (325 mg total) by mouth every other day.    . lidocaine-prilocaine (EMLA) cream Apply 1 application topically as needed (to port a cath 1 hour prior to each chemotherapy). 30 g 3  . metoprolol tartrate (LOPRESSOR) 25 MG tablet Take 1 tablet (25 mg total) by mouth 2 (two) times daily. 60 tablet 1  . pantoprazole (PROTONIX) 40 MG tablet Take 1 tablet (40 mg total) by mouth 2 (two) times daily  before a meal. 180 tablet 1  . tadalafil (CIALIS) 20 MG tablet TAKE ONE TABLET BY MOUTH DAILY AS NEEDED FOR ERECTILE DYSFUNCTION 20 tablet 3  . clopidogrel (PLAVIX) 75 MG tablet Take 1 tablet by mouth daily.    . ondansetron (ZOFRAN) 8 MG tablet Take 1 tablet (8 mg total) by mouth every 8 (eight) hours as needed for nausea or vomiting. (Patient not taking: No sig reported) 20 tablet 3  . prochlorperazine (COMPAZINE) 10 MG tablet Take 1 tablet (10 mg total) by mouth every 6 (six) hours as needed for nausea or vomiting. (Patient not taking: No sig reported) 30 tablet 3   No current facility-administered medications for this visit.   Facility-Administered Medications Ordered in Other Visits  Medication Dose Route Frequency Provider Last Rate Last Admin  . sodium chloride flush (NS) 0.9 % injection 10 mL  10 mL Intravenous PRN Cammie Sickle, MD   10 mL at 10/09/20 0855      .  PHYSICAL EXAMINATION: ECOG PERFORMANCE STATUS: 0 - Asymptomatic  Vitals:   10/09/20 0926  BP: (!) 143/84  Pulse: (!) 59  Resp: 16  Temp: (!) 96.4 F (35.8 C)  SpO2: 99%   Filed Weights   10/09/20 0926  Weight: 162 lb (73.5 kg)    Physical Exam Vitals and nursing note reviewed.  HENT:     Head: Normocephalic and atraumatic.     Mouth/Throat:     Pharynx: Oropharynx is clear.  Eyes:     Extraocular Movements: Extraocular movements intact.     Pupils: Pupils are equal, round, and reactive to light.  Cardiovascular:     Rate and Rhythm: Normal rate and regular rhythm.  Pulmonary:     Comments: Decreased breath sounds bilaterally.  Abdominal:     Palpations: Abdomen is soft.  Musculoskeletal:        General: Normal range of motion.     Cervical back: Normal range of motion.  Skin:    General: Skin is warm.  Neurological:     General: No focal deficit present.     Mental Status: He is alert and oriented to person, place, and time.  Psychiatric:        Behavior: Behavior normal.         Judgment: Judgment normal.  LABORATORY DATA:  I have reviewed the data as listed Lab Results  Component Value Date   WBC 2.3 (L) 10/23/2020   HGB 10.0 (L) 10/23/2020   HCT 33.4 (L) 10/23/2020   MCV 82.3 10/23/2020   PLT 97 (L) 10/23/2020   Recent Labs    09/25/20 0918 10/09/20 0844 10/23/20 0909  NA 135 138 135  K 4.1 3.7 3.8  CL 104 107 105  CO2 25 23 24   GLUCOSE 101* 100* 97  BUN 10 13 11   CREATININE 1.48* 1.43* 1.40*  CALCIUM 8.5* 8.5* 8.6*  GFRNONAA 48* 50* 52*  PROT 6.7 6.4* 6.4*  ALBUMIN 3.5 3.5 3.5  AST 16 30 22   ALT 13 21 16   ALKPHOS 122 110 104  BILITOT 0.7 0.5 0.2*    RADIOGRAPHIC STUDIES: I have personally reviewed the radiological images as listed and agreed with the findings in the report. No results found.  ASSESSMENT & PLAN:   Rectal cancer ([place])  #Synchronous primaries a] rectal cancer-10cm-adenocarcinoma & b] transverse colon- 70cm- intramucosal adenoca] from anus. PET scan: SEP 2022-proximal right colon [adjacent mesenteric lymph nodes] and rectal hypermetabolism.  Additional subcentimeter bilateral hypermetabolic lymphadenopathy noted in the retroperitoneal/left external iliac; inferior right lobe of the liver concerning for metastatic disease; right lower quadrant soft tissue mass concerning for peritoneal carcinomatosis; bilateral solid pulmonary nodules ~5 mm; MRI rectum- T stage: T4a; N stage:  N1.   #I reviewed the concerning findings noted on the PET scan the patient and his daughter in detail.  Also reviewed imaging at the tumor conference.  Given stage IV cancer-discussed that this cannot be cured;m the median survival is approximately 2 years.  # proceed with FOLFOX #2 today. Labs today reviewed;  acceptable for treatment today.  We will add bevacizumab at next visit.  # Anemia/iron deficiency hemoglobin ~9.6- STABLE; secondary to chronic GI bleeding/tumor.  Proceed with Venofer; day 3/pump of  #Chronic kidney disease stage III-  STABLE.   # Weight loss;  # refer to nutrition/Joli re: weight loss  # Vaccinations: Flu shot week; s/p booster #2.   Will callLenna Sciara- [phone]/[email or web address]  # DISPOSITION:  # refer to nutrition/Joli re: weight loss # FOLFOX + Bev chemo today; pump off in 48 hours/venofer # # follow up in 2 weeks- MD: labs- cbc/cmp' FOLFOX+ bev pump off in 2 days; Venofer--Dr.B  # 40 minutes face-to-face with the patient discussing the above plan of care; more than 50% of time spent on prognosis/ natural history; counseling and coordination.  # I reviewed the blood work- with the patient in detail; also reviewed the imaging independently [as summarized above]; and with the patient in detail.     All questions were answered. The patient knows to call the clinic with any problems, questions or concerns.    Cammie Sickle, MD 10/23/2020 5:00 PM

## 2020-10-09 NOTE — Assessment & Plan Note (Addendum)
#  Synchronous primaries a] rectal cancer-10cm-adenocarcinoma & b] transverse colon- 70cm- intramucosal adenoca] from anus. PET scan: SEP 2022-proximal right colon [adjacent mesenteric lymph nodes] and rectal hypermetabolism.  Additional subcentimeter bilateral hypermetabolic lymphadenopathy noted in the retroperitoneal/left external iliac; inferior right lobe of the liver concerning for metastatic disease; right lower quadrant soft tissue mass concerning for peritoneal carcinomatosis; bilateral solid pulmonary nodules ~5 mm; MRI rectum- T stage: T4a; N stage:  N1.   #I reviewed the concerning findings noted on the PET scan the patient and his daughter in detail.  Also reviewed imaging at the tumor conference.  Given stage IV cancer-discussed that this cannot be cured;m the median survival is approximately 2 years.  # proceed with FOLFOX #2 today. Labs today reviewed;  acceptable for treatment today.  We will add bevacizumab at next visit.  # Anemia/iron deficiency hemoglobin ~9.6- STABLE; secondary to chronic GI bleeding/tumor.  Proceed with Venofer; day 3/pump of  #Chronic kidney disease stage III- STABLE.   # Weight loss;  # refer to nutrition/Joli re: weight loss  # Vaccinations: Flu shot week; s/p booster #2.   Will callLenna Sciara- [phone]/[email or web address]  # DISPOSITION:  # refer to nutrition/Joli re: weight loss # FOLFOX + Bev chemo today; pump off in 48 hours/venofer # # follow up in 2 weeks- MD: labs- cbc/cmp' FOLFOX+ bev pump off in 2 days; Venofer--Dr.B  # 40 minutes face-to-face with the patient discussing the above plan of care; more than 50% of time spent on prognosis/ natural history; counseling and coordination.  # I reviewed the blood work- with the patient in detail; also reviewed the imaging independently [as summarized above]; and with the patient in detail.

## 2020-10-09 NOTE — Progress Notes (Signed)
Pt states that he is tolerating chemotherapy well. Other than fatigue, he denies any other side effects. Reports good appetite.

## 2020-10-11 ENCOUNTER — Inpatient Hospital Stay: Payer: Medicare HMO

## 2020-10-11 ENCOUNTER — Other Ambulatory Visit: Payer: Self-pay

## 2020-10-11 ENCOUNTER — Telehealth: Payer: Self-pay | Admitting: *Deleted

## 2020-10-11 VITALS — BP 144/79 | HR 61 | Temp 96.3°F | Resp 16

## 2020-10-11 DIAGNOSIS — D500 Iron deficiency anemia secondary to blood loss (chronic): Secondary | ICD-10-CM

## 2020-10-11 DIAGNOSIS — C20 Malignant neoplasm of rectum: Secondary | ICD-10-CM

## 2020-10-11 DIAGNOSIS — Z79899 Other long term (current) drug therapy: Secondary | ICD-10-CM | POA: Diagnosis not present

## 2020-10-11 DIAGNOSIS — Z5111 Encounter for antineoplastic chemotherapy: Secondary | ICD-10-CM | POA: Diagnosis not present

## 2020-10-11 MED ORDER — IRON SUCROSE 20 MG/ML IV SOLN
200.0000 mg | Freq: Once | INTRAVENOUS | Status: AC
  Administered 2020-10-11: 200 mg via INTRAVENOUS
  Filled 2020-10-11: qty 10

## 2020-10-11 MED ORDER — SODIUM CHLORIDE 0.9 % IV SOLN: 200.0000 mg | Freq: Once | INTRAVENOUS | Status: DC

## 2020-10-11 MED ORDER — SODIUM CHLORIDE 0.9% FLUSH
10.0000 mL | INTRAVENOUS | Status: DC | PRN
  Administered 2020-10-11: 10 mL
  Filled 2020-10-11: qty 10

## 2020-10-11 MED ORDER — SODIUM CHLORIDE 0.9 % IV SOLN
Freq: Once | INTRAVENOUS | Status: AC
Start: 2020-10-11 — End: 2020-10-11
  Filled 2020-10-11: qty 250

## 2020-10-11 MED ORDER — HEPARIN SOD (PORK) LOCK FLUSH 100 UNIT/ML IV SOLN
500.0000 [IU] | Freq: Once | INTRAVENOUS | Status: AC | PRN
Start: 2020-10-11 — End: 2020-10-11
  Administered 2020-10-11: 500 [IU]
  Filled 2020-10-11: qty 5

## 2020-10-11 NOTE — Telephone Encounter (Signed)
Fmla for daughter completed- pending md signature

## 2020-10-11 NOTE — Progress Notes (Signed)
Nutrition Assessment   Reason for Assessment:  Referral for weight loss   ASSESSMENT:  75 year old male with synchronous colon/rectal cancer.  Past medical history CAD, GERD, NSTEMI.  Patient receiving folfox.    Met with patient today during infusion of venofer.  Patient reports that he has a good appetite.  Says that he normally eats cereal or oatmeal for breakfast.  Lunch today was egg sandwich with yogurt.  Supper last night was side meat and green beans, potatoes and carrots.  Sometimes will snack on peanut butter nabs.  Has not tried ensure/boost shakes.    Denies nausea, abdominal pain.  Does report some issues with constipation but taking miralax.     Medications: zofran, protonix, compazine, vit D, Vit B 12, fe sulfate   Labs: glucose 100, creatinine 1.43, calcium 8.5   Anthropometrics:   Height: 68 inches Weight: 162 lb on 9/26 UBW: 172-173 lb per patient 8/29 BMI: 24  6% weight loss in the last month  Estimated Energy Needs  Kcals: 1850-2200 Protein: 93-110 g Fluid: 1.8 L   NUTRITION DIAGNOSIS: Inadequate oral intake related to cancer and cancer as evidenced by 6% weight loss in the last month   INTERVENTION:  Discussed importance of eating well and maintaining weight during treatment Encouraged protein food at every meal.  Provided examples of foods high in protein. Contact information provided   MONITORING, EVALUATION, GOAL: weight trends, intake   Next Visit: Wednesday, October 12 during infusion  Tyrese Capriotti B. Zenia Resides, [place], [place] Registered Dietitian [phone] (mobile)

## 2020-10-12 ENCOUNTER — Inpatient Hospital Stay (HOSPITAL_BASED_OUTPATIENT_CLINIC_OR_DEPARTMENT_OTHER): Payer: Medicare HMO | Admitting: Licensed Clinical Social Worker

## 2020-10-12 ENCOUNTER — Encounter: Payer: Self-pay | Admitting: Licensed Clinical Social Worker

## 2020-10-12 ENCOUNTER — Inpatient Hospital Stay: Payer: Medicare HMO

## 2020-10-12 DIAGNOSIS — Z800 Family history of malignant neoplasm of digestive organs: Secondary | ICD-10-CM

## 2020-10-12 DIAGNOSIS — C20 Malignant neoplasm of rectum: Secondary | ICD-10-CM

## 2020-10-12 DIAGNOSIS — Z803 Family history of malignant neoplasm of breast: Secondary | ICD-10-CM | POA: Diagnosis not present

## 2020-10-12 NOTE — Progress Notes (Signed)
REFERRING PROVIDER: Ileana Roup, MD [place],  [place] [address]  PRIMARY PROVIDER:  [name], MD  PRIMARY REASON FOR VISIT:  1. Family history of breast cancer   2. Family history of pancreatic cancer   3. Family history of colon cancer   4. Rectal cancer ([place])      HISTORY OF PRESENT ILLNESS:   Mr. Hartlage, a 75 y.o. male, was seen for a [facility] cancer genetics consultation at the request of Dr. Dema Severin due to a family history of cancer.  Mr. Boomhower presents to clinic today to discuss the possibility of a hereditary predisposition to cancer, genetic testing, and to further clarify his future cancer risks, as well as potential cancer risks for family members.   In 2022, at the age of 57, Mr. Bazzi was diagnosed with synchronous colorectal cancers, one in the rectum and one in the transverse colon. This was his first colonoscopy. This is currently being treated with chemotherapy. Tumor testing showed MMR intact.     CANCER HISTORY:  Oncology History Overview Note  # Malignant partially obstructing tumor in the transverse colon/70 cm proximal to the anus- C. COLON MASS, 70 CM; COLD BIOPSY:  - INTRAMUCOSAL ADENOCARCINOMA AT LEAST;  # One 20 mm polyp in the transverse colon, removed with mucosal resection. Resected and retrieved. Clips were placed. Tattooed.  # tumor in the mid rectum and at 10 cm proximal to the anus. Biopsied.      SEE COMMENT.   Comment:  There is no definitive evidence of invasion in this sample.  The  findings may not accurately represent the entire underlying lesion;  clinical correlation is recommended.   D. COLON POLYP, TRANSVERSE; HOT SNARE:  - TUBULOVILLOUS ADENOMA.  - NEGATIVE FOR HIGH GRADE DYSPLASIA AND MALIGNANCY.   E. RECTUM MASS; COLD BIOPSY:  - INVASIVE ADENOCARCINOMA, MODERATELY TO POORLY DIFFERENTIATED. ----------------------------------   # PET scan: SEP 2022-proximal right colon [adjacent mesenteric lymph  nodes] and rectal hypermetabolism.  Additional subcentimeter bilateral hypermetabolic lymphadenopathy noted in the retroperitoneal/left external iliac; inferior right lobe of the liver concerning for metastatic disease; right lower quadrant soft tissue mass concerning for peritoneal carcinomatosis; bilateral solid pulmonary nodules ~5 mm; MRI rectum- T stage: T4a; N stage:  N1.   # 09/12-2020- FOLFOX chemo [Dr.White/Dr.Vanga]   Rectal cancer ([place])  09/11/2020 Initial Diagnosis   Rectal cancer ([place])   09/25/2020 -  Chemotherapy   Patient is on Treatment Plan : COLORECTAL FOLFOX q14d x 4 months     09/28/2020 Cancer Staging   Staging form: Colon and Rectum, AJCC 8th Edition - Clinical: Stage IVC (cT4a, cN1, cM1c) - Signed by Cammie Sickle, MD on 09/28/2020     Past Medical History:  Diagnosis Date   CAD (coronary artery disease) 06/2014   UA with NSTEMI - cath with 99% prox L circ s/p stent, EF 40% ([place], [place] [facility])   Emphysema    mild   Ex-smoker    Family history of breast cancer    Family history of colon cancer    Family history of pancreatic cancer    NSTEMI (non-ST elevated myocardial infarction) ([place]) 07/13/2014    Past Surgical History:  Procedure Laterality Date   CARDIAC CATHETERIZATION N/A 07/14/2014   Left Heart Cath and Coronary Angiography with stent placement;  Surgeon: Teodoro Spray, MD   CARDIAC CATHETERIZATION N/A 07/14/2014   Coronary Stent Intervention;  Surgeon: Yolonda Kida, MD   COLONOSCOPY WITH PROPOFOL N/A  09/06/2020   Procedure: COLONOSCOPY WITH PROPOFOL;  Surgeon: Lin Landsman, MD;  [facility] ENDOSCOPY;  Service: Gastroenterology;  Laterality: N/A;   CYSTECTOMY     on back   ESOPHAGOGASTRODUODENOSCOPY N/A 09/06/2020   Procedure: ESOPHAGOGASTRODUODENOSCOPY (EGD);  Surgeon: Lin Landsman, MD;  Location: [facility] ENDOSCOPY;  Service: Gastroenterology;  Laterality: N/A;   INGUINAL HERNIA REPAIR Right 08/17/04   IR IMAGING GUIDED  PORT INSERTION  09/13/2020    Social History   Socioeconomic History   Marital status: Married    Spouse name: Not on file   Number of children: Not on file   Years of education: Not on file   Highest education level: Not on file  Occupational History   Not on file  Tobacco Use   Smoking status: Former    Packs/day: 1.00    Years: 35.00    Pack years: 35.00    Types: Cigarettes    Quit date: 07/07/1998    Years since quitting: 22.2   Smokeless tobacco: Never  Vaping Use   Vaping Use: Never used  Substance and Sexual Activity   Alcohol use: No   Drug use: No   Sexual activity: Not on file  Other Topics Concern   Not on file  Social History Narrative   Caffeine: 2 cups coffee, 2 cups tea/day   Lives with wife   Occupation: Higher education careers adviser, retired   Edu: HS   Activity: works in yard, walking   Diet: some water, fruits/vegetables daily   -------------------------------------------------------------------       [place]; [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    Social Determinants of Health   Financial Resource Strain: Low Risk    Difficulty of Paying Living Expenses: Not hard at all  Food Insecurity: No Food Insecurity   Worried About Charity fundraiser in the Last Year: Never true   [place] in the Last Year: Never true  Transportation Needs: No Transportation Needs   Lack of Transportation (Medical): No   Lack of Transportation (Non-Medical): No  Physical Activity: Inactive   Days of Exercise per Week: 0 days   Minutes of Exercise per Session: 0 min  Stress: No Stress Concern Present   Feeling of Stress : Not at all  Social Connections: Not on file     FAMILY HISTORY:  We obtained a detailed, 4-generation family history.  Significant diagnoses are listed below: Family History  Problem Relation Age of Onset   Cancer Mother 74       colon   Cancer Sister        breast    Crohn's disease Sister    Cancer Daughter        anal   Crohn's disease Niece    CAD Neg Hx    Stroke Neg Hx    Diabetes Neg Hx    Prostate cancer Neg Hx    Kidney cancer Neg Hx    Bladder Cancer Neg Hx    Mr. Hakim has 2 daughters (69 and 76). His oldest daughter had anal cancer at 26 and is doing well. Patient had 5 sisters. One had pancreatic and breast cancer, unsure ages of diagnosis, she is living at 7. Another sister had breast cancer and died at 34. Her daughter had breast cancer over 62, unsure exact age.   Mr. Dolberry mother had colon cancer in her 73s. Patient had 4 maternal uncles, 1  aunt. An uncle and the aunt also had colon cancer. No known cancers in cousins or grandparents.  Mr. Mcquarrie father died at 85. No known cancers on this side of the family except for his paternal grandfather who had an unknown type of cancer.   Mr. Wymore is unaware of previous family history of genetic testing for hereditary cancer risks. Patient's maternal ancestors are of [place] descent, and paternal ancestors are of [place] descent. There is no reported Ashkenazi Jewish ancestry. There is no known consanguinity.    GENETIC COUNSELING ASSESSMENT: Mr. Granieri is a 75 y.o. male with a personal and family history of cancer which is somewhat suggestive of a hereditary cancer syndrome and predisposition to cancer. We, therefore, discussed and recommended the following at today's visit.   DISCUSSION: We discussed that approximately 5-10% of colorectal cancer is hereditary. Most cases of hereditary colorectal cancer are associated with Lynch syndrome genes. It is unlikely he has Lynch syndrome since his tumor testing came back with MMR intact, but there are other genes associated with hereditary colorectal/breast/pancreatic cancer as well. Cancers and risks are gene specific.  We discussed that testing is beneficial for several reasons including knowing about other cancer risks, identifying potential  screening and risk-reduction options that may be appropriate, and to understand if other family members could be at risk for cancer and allow them to undergo genetic testing.   We reviewed the characteristics, features and inheritance patterns of hereditary cancer syndromes. We also discussed genetic testing, including the appropriate family members to test, the process of testing, insurance coverage and turn-around-time for results. We discussed the implications of a negative, positive and/or variant of uncertain significant result. We recommended Mr. Shek pursue genetic testing for the [facility] Multi-Cancer+RNA gene panel.   The Multi-Cancer Panel + RNA offered by [facility] includes sequencing and/or deletion duplication testing of the following 84 genes: AIP, ALK, APC, ATM, AXIN2,BAP1,  BARD1, BLM, BMPR1A, BRCA1, BRCA2, BRIP1, CASR, CDC73, CDH1, CDK4, CDKN1B, CDKN1C, CDKN2A (p14ARF), CDKN2A (p16INK4a), CEBPA, CHEK2, CTNNA1, DICER1, DIS3L2, EGFR (c.2369C>T, p.Thr790Met variant only), EPCAM (Deletion/duplication testing only), FH, FLCN, GATA2, GPC3, GREM1 (Promoter region deletion/duplication testing only), HOXB13 (c.251G>A, p.Gly84Glu), HRAS, KIT, MAX, MEN1, MET, MITF (c.952G>A, p.Glu318Lys variant only), MLH1, MSH2, MSH3, MSH6, MUTYH, NBN, NF1, NF2, NTHL1, PALB2, PDGFRA, PHOX2B, PMS2, POLD1, POLE, POT1, PRKAR1A, PTCH1, PTEN, RAD50, RAD51C, RAD51D, RB1, RECQL4, RET, RUNX1, SDHAF2, SDHA (sequence changes only), SDHB, SDHC, SDHD, SMAD4, SMARCA4, SMARCB1, SMARCE1, STK11, SUFU, TERC, TERT, TMEM127, TP53, TSC1, TSC2, VHL, WRN and WT1.  Based on Mr. Bueso's personal and family history of cancer, he meets medical criteria for genetic testing. Despite that he meets criteria, he may still have an out of pocket cost. We discussed that if his out of pocket cost for testing is over $100, the laboratory will call and confirm whether he wants to proceed with testing.  If the out of pocket cost of testing is less than $100  he will be billed by the genetic testing laboratory.   PLAN: After considering the risks, benefits, and limitations, Mr. Reever provided informed consent to pursue genetic testing and the blood sample was sent to [facility] for analysis of the Multi-Cancer+RNA panel. Results should be available within approximately 2-3 weeks' time, at which point they will be disclosed by telephone to Mr. Doyel, as will any additional recommendations warranted by these results. Mr. Searfoss will receive a summary of his genetic counseling visit and a copy of his results once available. This information will also  be available in Epic.   Mr. Mergen questions were answered to his satisfaction today. Our contact information was provided should additional questions or concerns arise. Thank you for the referral and allowing [place] to share in the care of your patient.   Faith Rogue, MS, [facility] Genetic Counselor [email or web address] Phone: [phone]  The patient was seen for a total of 25 minutes in face-to-face genetic counseling.  Patient was seen with his wife, Enid Derry. Dr. Grayland Ormond was available for discussion regarding this case.   _______________________________________________________________________ For Office Staff:  Number of people involved in session: 2 Was an Intern/ student involved with case: no

## 2020-10-23 ENCOUNTER — Encounter: Payer: Self-pay | Admitting: Internal Medicine

## 2020-10-23 ENCOUNTER — Inpatient Hospital Stay (HOSPITAL_BASED_OUTPATIENT_CLINIC_OR_DEPARTMENT_OTHER): Payer: Medicare HMO | Admitting: Internal Medicine

## 2020-10-23 ENCOUNTER — Other Ambulatory Visit: Payer: Self-pay

## 2020-10-23 ENCOUNTER — Inpatient Hospital Stay: Payer: Medicare HMO | Attending: Internal Medicine

## 2020-10-23 ENCOUNTER — Inpatient Hospital Stay: Payer: Medicare HMO

## 2020-10-23 ENCOUNTER — Other Ambulatory Visit: Payer: Self-pay | Admitting: *Deleted

## 2020-10-23 VITALS — BP 115/73 | HR 54 | Temp 96.0°F | Resp 17

## 2020-10-23 DIAGNOSIS — Z5111 Encounter for antineoplastic chemotherapy: Secondary | ICD-10-CM | POA: Diagnosis not present

## 2020-10-23 DIAGNOSIS — C20 Malignant neoplasm of rectum: Secondary | ICD-10-CM

## 2020-10-23 DIAGNOSIS — D500 Iron deficiency anemia secondary to blood loss (chronic): Secondary | ICD-10-CM

## 2020-10-23 DIAGNOSIS — Z79899 Other long term (current) drug therapy: Secondary | ICD-10-CM | POA: Diagnosis not present

## 2020-10-23 DIAGNOSIS — N183 Chronic kidney disease, stage 3 unspecified: Secondary | ICD-10-CM | POA: Diagnosis not present

## 2020-10-23 DIAGNOSIS — D509 Iron deficiency anemia, unspecified: Secondary | ICD-10-CM | POA: Insufficient documentation

## 2020-10-23 LAB — COMPREHENSIVE METABOLIC PANEL
ALT: 16 U/L (ref 0–44)
AST: 22 U/L (ref 15–41)
Albumin: 3.5 g/dL (ref 3.5–5.0)
Alkaline Phosphatase: 104 U/L (ref 38–126)
Anion gap: 6 (ref 5–15)
BUN: 11 mg/dL (ref 8–23)
CO2: 24 mmol/L (ref 22–32)
Calcium: 8.6 mg/dL — ABNORMAL LOW (ref 8.9–10.3)
Chloride: 105 mmol/L (ref 98–111)
Creatinine, Ser: 1.4 mg/dL — ABNORMAL HIGH (ref 0.61–1.24)
GFR, Estimated: 52 mL/min — ABNORMAL LOW (ref >60)
Glucose, Bld: 97 mg/dL (ref 70–99)
Potassium: 3.8 mmol/L (ref 3.5–5.1)
Sodium: 135 mmol/L (ref 135–145)
Total Bilirubin: 0.2 mg/dL — ABNORMAL LOW (ref 0.3–1.2)
Total Protein: 6.4 g/dL — ABNORMAL LOW (ref 6.5–8.1)

## 2020-10-23 LAB — CBC WITH DIFFERENTIAL/PLATELET
Abs Immature Granulocytes: 0 10*3/uL (ref 0.00–0.07)
Basophils Absolute: 0.1 10*3/uL (ref 0.0–0.1)
Basophils Relative: 2 %
Eosinophils Absolute: 0.2 10*3/uL (ref 0.0–0.5)
Eosinophils Relative: 9 %
HCT: 33.4 % — ABNORMAL LOW (ref 39.0–52.0)
Hemoglobin: 10 g/dL — ABNORMAL LOW (ref 13.0–17.0)
Immature Granulocytes: 0 %
Lymphocytes Relative: 48 %
Lymphs Abs: 1.1 10*3/uL (ref 0.7–4.0)
MCH: 24.6 pg — ABNORMAL LOW (ref 26.0–34.0)
MCHC: 29.9 g/dL — ABNORMAL LOW (ref 30.0–36.0)
MCV: 82.3 fL (ref 80.0–100.0)
Monocytes Absolute: 0.4 10*3/uL (ref 0.1–1.0)
Monocytes Relative: 16 %
Neutro Abs: 0.6 10*3/uL — ABNORMAL LOW (ref 1.7–7.7)
Neutrophils Relative %: 25 %
Platelets: 97 10*3/uL — ABNORMAL LOW (ref 150–400)
RBC: 4.06 MIL/uL — ABNORMAL LOW (ref 4.22–5.81)
RDW: 22.6 % — ABNORMAL HIGH (ref 11.5–15.5)
WBC: 2.3 10*3/uL — ABNORMAL LOW (ref 4.0–10.5)
nRBC: 0 % (ref 0.0–0.2)

## 2020-10-23 MED ORDER — HEPARIN SOD (PORK) LOCK FLUSH 100 UNIT/ML IV SOLN
INTRAVENOUS | Status: AC
  Administered 2020-10-23: 500 [IU] via INTRAVENOUS
  Filled 2020-10-23: qty 5

## 2020-10-23 MED ORDER — SODIUM CHLORIDE 0.9 % IV SOLN: 200.0000 mg | Freq: Once | INTRAVENOUS | Status: DC

## 2020-10-23 MED ORDER — HEPARIN SOD (PORK) LOCK FLUSH 100 UNIT/ML IV SOLN
500.0000 [IU] | Freq: Once | INTRAVENOUS | Status: AC
  Filled 2020-10-23: qty 5

## 2020-10-23 MED ORDER — SODIUM CHLORIDE 0.9% FLUSH
10.0000 mL | INTRAVENOUS | Status: DC | PRN
  Administered 2020-10-23: 10 mL via INTRAVENOUS
  Filled 2020-10-23: qty 10

## 2020-10-23 MED ORDER — SODIUM CHLORIDE 0.9 % IV SOLN
Freq: Once | INTRAVENOUS | Status: AC
Start: 2020-10-23 — End: 2020-10-23
  Filled 2020-10-23: qty 250

## 2020-10-23 MED ORDER — IRON SUCROSE 20 MG/ML IV SOLN
200.0000 mg | Freq: Once | INTRAVENOUS | Status: AC
  Administered 2020-10-23: 200 mg via INTRAVENOUS
  Filled 2020-10-23: qty 10

## 2020-10-23 NOTE — Progress Notes (Signed)
Patient here for oncology follow-up appointment, expresses no complaints or concerns at this time.    

## 2020-10-23 NOTE — Assessment & Plan Note (Addendum)
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 PET scan: SEP 2022-proximal right colon [adjacent mesenteric lymph nodes] and rectal hypermetabolism.  Additional subcentimeter bilateral hypermetabolic lymphadenopathy noted in the retroperitoneal/left external iliac; inferior right lobe of the liver concerning for metastatic disease; right lower quadrant soft tissue mass concerning for peritoneal carcinomatosis; bilateral solid pulmonary nodules ~5 mm; MRI rectum- T stage: T4a; N stage:  N1. Currently on pallaitive chemo- FOLFOX; add bev  # HOLD chemo today .labs- ANC-0.6; platelets- 93; Hb-10.  Discussed rationale for holding therapy; will discontinue for further stability.  Continue oxaliplatin; and add bevacizumab at next visit.  # proceed with FOLFOX #2 today. Labs today reviewed;  acceptable for treatment today.   # Anemia/iron deficiency hemoglobin ~9.6- STABLE. ; secondary to chronic GI bleeding/tumor.  Plan Venofer next visit.  # Chronic kidney disease stage III- STABLE.  # DISPOSITION:  # HOLD chemo today; cancel- pump off appt.   # follow up in 2 weeks- MD: labs- cbc/cmp' FOLFOX+ bev pump off in 2 days; Venofer--Dr.B

## 2020-10-23 NOTE — Progress Notes (Signed)
[facility] NOTE  Patient Care Team: [name], MD as PCP - General (Family Medicine) Clent Jacks, RN as Oncology Nurse Navigator  CHIEF COMPLAINTS/PURPOSE OF CONSULTATION: colon/rectal cancer  #  Oncology History Overview Note  # Malignant partially obstructing tumor in the transverse colon/70 cm proximal to the anus- C. COLON MASS, 70 CM; COLD BIOPSY:  - INTRAMUCOSAL ADENOCARCINOMA AT LEAST;  # One 20 mm polyp in the transverse colon, removed with mucosal resection. Resected and retrieved. Clips were placed. Tattooed.  # tumor in the mid rectum and at 10 cm proximal to the anus. Biopsied.      SEE COMMENT.   Comment:  There is no definitive evidence of invasion in this sample.  The  findings may not accurately represent the entire underlying lesion;  clinical correlation is recommended.   D. COLON POLYP, TRANSVERSE; HOT SNARE:  - TUBULOVILLOUS ADENOMA.  - NEGATIVE FOR HIGH GRADE DYSPLASIA AND MALIGNANCY.   E. RECTUM MASS; COLD BIOPSY:  - INVASIVE ADENOCARCINOMA, MODERATELY TO POORLY DIFFERENTIATED. ----------------------------------   # PET scan: SEP 2022-proximal right colon [adjacent mesenteric lymph nodes] and rectal hypermetabolism.  Additional subcentimeter bilateral hypermetabolic lymphadenopathy noted in the retroperitoneal/left external iliac; inferior right lobe of the liver concerning for metastatic disease; right lower quadrant soft tissue mass concerning for peritoneal carcinomatosis; bilateral solid pulmonary nodules ~5 mm; MRI rectum- T stage: T4a; N stage:  N1.   # 09/12-2020- FOLFOX chemo [Dr.White/Dr.Vanga]   Rectal cancer ([place])  09/11/2020 Initial Diagnosis   Rectal cancer ([place])   09/25/2020 -  Chemotherapy   Patient is on Treatment Plan : COLORECTAL FOLFOX q14d x 4 months     09/28/2020 Cancer Staging   Staging form: Colon and Rectum, AJCC 8th Edition - Clinical: Stage IVC (cT4a, cN1, cM1c) - Signed by Cammie Sickle, MD on 09/28/2020      HISTORY OF PRESENTING ILLNESS: Ambulating independently.  Accompanied by his daughter.  [name] 75 y.o.  male i synchronous colon cancer/rectal cancer-stage IV-on FOLFOX chemotherapy is here for follow-up.  Mild nausea.  No vomiting.  No fever no chills.  No significant tingling or numbness.  Review of Systems  Constitutional:  Negative for chills, diaphoresis, fever and weight loss.  HENT:  Negative for nosebleeds and sore throat.   Eyes:  Negative for double vision.  Respiratory:  Negative for cough, hemoptysis, sputum production, shortness of breath and wheezing.   Cardiovascular:  Negative for chest pain, palpitations, orthopnea and leg swelling.  Gastrointestinal:  Negative for abdominal pain, blood in stool, constipation, diarrhea, heartburn, melena, nausea and vomiting.  Genitourinary:  Negative for dysuria, frequency and urgency.  Skin: Negative.  Negative for itching and rash.  Neurological:  Negative for dizziness, tingling, focal weakness, weakness and headaches.  Endo/Heme/Allergies:  Does not bruise/bleed easily.  Psychiatric/Behavioral:  Negative for depression. The patient is not nervous/anxious and does not have insomnia.     MEDICAL HISTORY:  Past Medical History:  Diagnosis Date   CAD (coronary artery disease) 06/2014   UA with NSTEMI - cath with 99% prox L circ s/p stent, EF 40% ([place], [place] at [facility])   Emphysema    mild   Ex-smoker    Family history of breast cancer    Family history of colon cancer    Family history of pancreatic cancer    NSTEMI (non-ST elevated myocardial infarction) ([place]) 07/13/2014    SURGICAL HISTORY: Past Surgical History:  Procedure Laterality Date   CARDIAC  CATHETERIZATION N/A 07/14/2014   Left Heart Cath and Coronary Angiography with stent placement;  Surgeon: Teodoro Spray, MD   CARDIAC CATHETERIZATION N/A 07/14/2014   Coronary Stent Intervention;  Surgeon: Yolonda Kida, MD    COLONOSCOPY WITH PROPOFOL N/A 09/06/2020   Procedure: COLONOSCOPY WITH PROPOFOL;  Surgeon: Lin Landsman, MD;  Location: [facility] ENDOSCOPY;  Service: Gastroenterology;  Laterality: N/A;   CYSTECTOMY     on back   ESOPHAGOGASTRODUODENOSCOPY N/A 09/06/2020   Procedure: ESOPHAGOGASTRODUODENOSCOPY (EGD);  Surgeon: Lin Landsman, MD;  Location: [facility] ENDOSCOPY;  Service: Gastroenterology;  Laterality: N/A;   INGUINAL HERNIA REPAIR Right 08/17/04   IR IMAGING GUIDED PORT INSERTION  09/13/2020    SOCIAL HISTORY: Social History   Socioeconomic History   Marital status: Married    Spouse name: Not on file   Number of children: Not on file   Years of education: Not on file   Highest education level: Not on file  Occupational History   Not on file  Tobacco Use   Smoking status: Former    Packs/day: 1.00    Years: 35.00    Pack years: 35.00    Types: Cigarettes    Quit date: 07/07/1998    Years since quitting: 22.3   Smokeless tobacco: Never  Vaping Use   Vaping Use: Never used  Substance and Sexual Activity   Alcohol use: No   Drug use: No   Sexual activity: Not on file  Other Topics Concern   Not on file  Social History Narrative   Caffeine: 2 cups coffee, 2 cups tea/day   Lives with wife   Occupation: Higher education careers adviser, retired   Edu: HS   Activity: works in yard, walking   Diet: some water, fruits/vegetables daily   -------------------------------------------------------------------       [employer] [place]; [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    Social Determinants of Health   Financial Resource Strain: Low Risk    Difficulty of Paying Living Expenses: Not hard at all  Food Insecurity: No Food Insecurity   Worried About Charity fundraiser in the Last Year: Never true   [place] in the Last Year: Never true  Transportation Needs: No Transportation Needs   Lack of Transportation (Medical): No    Lack of Transportation (Non-Medical): No  Physical Activity: Inactive   Days of Exercise per Week: 0 days   Minutes of Exercise per Session: 0 min  Stress: No Stress Concern Present   Feeling of Stress : Not at all  Social Connections: Not on file  Intimate Partner Violence: Not At Risk   Fear of Current or Ex-Partner: No   Emotionally Abused: No   Physically Abused: No   Sexually Abused: No    FAMILY HISTORY: Family History  Problem Relation Age of Onset   Cancer Mother 57       colon   Cancer Sister        breast   Crohn's disease Sister    Cancer Daughter        anal   Crohn's disease Niece    CAD Neg Hx    Stroke Neg Hx    Diabetes Neg Hx    Prostate cancer Neg Hx    Kidney cancer Neg Hx    Bladder Cancer Neg Hx     ALLERGIES:  has No Known Allergies.  MEDICATIONS:  Current Outpatient Medications  Medication  Sig Dispense Refill   aspirin EC 81 MG tablet Take 1 tablet (81 mg total) by mouth daily. 60 tablet 1   atorvastatin (LIPITOR) 40 MG tablet Take 1 tablet (40 mg total) by mouth daily at 6 PM. 60 tablet 1   Cholecalciferol (VITAMIN D3) 25 MCG (1000 UT) CAPS Take 1 capsule (1,000 Units total) by mouth daily. 30 capsule    clopidogrel (PLAVIX) 75 MG tablet Take 1 tablet (75 mg total) by mouth daily. 60 tablet 1   clopidogrel (PLAVIX) 75 MG tablet Take 1 tablet by mouth daily.     Cyanocobalamin (B-12) 1000 MCG SUBL Place 1 tablet under the tongue daily.     ferrous sulfate 324 (65 Fe) MG TBEC Take 1 tablet (325 mg total) by mouth every other day.     lidocaine-prilocaine (EMLA) cream Apply 1 application topically as needed (to port a cath 1 hour prior to each chemotherapy). 30 g 3   metoprolol tartrate (LOPRESSOR) 25 MG tablet Take 1 tablet (25 mg total) by mouth 2 (two) times daily. 60 tablet 1   pantoprazole (PROTONIX) 40 MG tablet Take 1 tablet (40 mg total) by mouth 2 (two) times daily before a meal. 180 tablet 1   tadalafil (CIALIS) 20 MG tablet TAKE ONE  TABLET BY MOUTH DAILY AS NEEDED FOR ERECTILE DYSFUNCTION 20 tablet 3   ondansetron (ZOFRAN) 8 MG tablet Take 1 tablet (8 mg total) by mouth every 8 (eight) hours as needed for nausea or vomiting. (Patient not taking: No sig reported) 20 tablet 3   prochlorperazine (COMPAZINE) 10 MG tablet Take 1 tablet (10 mg total) by mouth every 6 (six) hours as needed for nausea or vomiting. (Patient not taking: No sig reported) 30 tablet 3   No current facility-administered medications for this visit.   Facility-Administered Medications Ordered in Other Visits  Medication Dose Route Frequency Provider Last Rate Last Admin   sodium chloride flush (NS) 0.9 % injection 10 mL  10 mL Intravenous PRN Cammie Sickle, MD   10 mL at 10/09/20 0855      .  PHYSICAL EXAMINATION: ECOG PERFORMANCE STATUS: 0 - Asymptomatic  Vitals:   10/23/20 0944 10/23/20 0947  BP:  (!) 144/77  Pulse: 63   Resp: 17   Temp: (!) 96.9 F (36.1 C)   SpO2: 99%    Filed Weights   10/23/20 0944  Weight: 162 lb (73.5 kg)    Physical Exam Vitals and nursing note reviewed.  HENT:     Head: Normocephalic and atraumatic.     Mouth/Throat:     Pharynx: Oropharynx is clear.  Eyes:     Extraocular Movements: Extraocular movements intact.     Pupils: Pupils are equal, round, and reactive to light.  Cardiovascular:     Rate and Rhythm: Normal rate and regular rhythm.  Pulmonary:     Comments: Decreased breath sounds bilaterally.  Abdominal:     Palpations: Abdomen is soft.  Musculoskeletal:        General: Normal range of motion.     Cervical back: Normal range of motion.  Skin:    General: Skin is warm.  Neurological:     General: No focal deficit present.     Mental Status: He is alert and oriented to person, place, and time.  Psychiatric:        Behavior: Behavior normal.        Judgment: Judgment normal.     LABORATORY DATA:  I have reviewed  the data as listed Lab Results  Component Value Date   WBC  2.3 (L) 10/23/2020   HGB 10.0 (L) 10/23/2020   HCT 33.4 (L) 10/23/2020   MCV 82.3 10/23/2020   PLT 97 (L) 10/23/2020   Recent Labs    09/25/20 0918 10/09/20 0844 10/23/20 0909  NA 135 138 135  K 4.1 3.7 3.8  CL 104 107 105  CO2 25 23 24   GLUCOSE 101* 100* 97  BUN 10 13 11   CREATININE 1.48* 1.43* 1.40*  CALCIUM 8.5* 8.5* 8.6*  GFRNONAA 48* 50* 52*  PROT 6.7 6.4* 6.4*  ALBUMIN 3.5 3.5 3.5  AST 16 30 22   ALT 13 21 16   ALKPHOS 122 110 104  BILITOT 0.7 0.5 0.2*    RADIOGRAPHIC STUDIES: I have personally reviewed the radiological images as listed and agreed with the findings in the report. No results found.  ASSESSMENT & PLAN:   Rectal cancer ([place])  # STAGE IV- Synchronous primaries a] rectal cancer-10cm-adenocarcinoma & b] transverse colon- 70cm- intramucosal adenoca] from anus. PET scan: SEP 2022-proximal right colon [adjacent mesenteric lymph nodes] and rectal hypermetabolism.  Additional subcentimeter bilateral hypermetabolic lymphadenopathy noted in the retroperitoneal/left external iliac; inferior right lobe of the liver concerning for metastatic disease; right lower quadrant soft tissue mass concerning for peritoneal carcinomatosis; bilateral solid pulmonary nodules ~5 mm; MRI rectum- T stage: T4a; N stage:  N1. Currently on pallaitive chemo- FOLFOX; add bev  # HOLD chemo today .labs- ANC-0.6; platelets- 93; Hb-10.  Discussed rationale for holding therapy; will discontinue for further stability.  Continue oxaliplatin; and add bevacizumab at next visit.   # proceed with FOLFOX #2 today. Labs today reviewed;  acceptable for treatment today.   # Anemia/iron deficiency hemoglobin ~9.6- STABLE. ; secondary to chronic GI bleeding/tumor.  Plan Venofer next visit.  # Chronic kidney disease stage III- STABLE.  # DISPOSITION:  # HOLD chemo today; cancel- pump off appt.   # follow up in 2 weeks- MD: labs- cbc/cmp' FOLFOX+ bev pump off in 2 days; Venofer--Dr.B   All questions  were answered. The patient knows to call the clinic with any problems, questions or concerns.    Cammie Sickle, MD 10/23/2020 4:56 PM

## 2020-10-23 NOTE — Patient Instructions (Signed)

## 2020-10-25 ENCOUNTER — Inpatient Hospital Stay: Payer: Medicare HMO

## 2020-10-25 DIAGNOSIS — C20 Malignant neoplasm of rectum: Secondary | ICD-10-CM | POA: Diagnosis not present

## 2020-10-25 NOTE — Progress Notes (Signed)
Nutrition Follow-up:  Patient with synchronous colon/rectal cancer.  Patient receiving folfox.    Spoke with patient via phone for nutrition follow-up.  Patient reports that his appetite is good.  "I have no problem with my appetite."  Ate cereal and banana for breakfast this am.  Ate egg and sausage sandwich and fruit for lunch and vegetable soup for dinner.  Has not tried ensure/boost shakes.  Drinks tea, sprite and ginger ale.    Denies nutrition impact symptoms    Medications: reviewed  Labs: reviewed  Anthropometrics:   Weight 162 lb 10/10 stable 162 lb on 9/26  UBW of 172-173 lb   NUTRITION DIAGNOSIS: Inadequate oral intake improved   INTERVENTION:  Encouraged patient to continue to eat well-balanced diet to maintain weight If weight decreases would recommend starting oral nutrition supplement    MONITORING, EVALUATION, GOAL: weight trends, intake   NEXT VISIT: 4-6 weeks during treatment  Albert Giles B. Zenia Resides, [place], [place] Registered Dietitian [phone] (mobile)

## 2020-11-06 ENCOUNTER — Telehealth: Payer: Self-pay | Admitting: Licensed Clinical Social Worker

## 2020-11-06 ENCOUNTER — Ambulatory Visit: Payer: Self-pay | Admitting: Licensed Clinical Social Worker

## 2020-11-06 ENCOUNTER — Encounter: Payer: Self-pay | Admitting: Licensed Clinical Social Worker

## 2020-11-06 DIAGNOSIS — Z1379 Encounter for other screening for genetic and chromosomal anomalies: Secondary | ICD-10-CM

## 2020-11-06 DIAGNOSIS — Z800 Family history of malignant neoplasm of digestive organs: Secondary | ICD-10-CM

## 2020-11-06 DIAGNOSIS — Z803 Family history of malignant neoplasm of breast: Secondary | ICD-10-CM

## 2020-11-06 NOTE — Progress Notes (Signed)
HPI:  [name] was previously seen in the [place] clinic due to a personal and family history of cancer and concerns regarding a hereditary predisposition to cancer. Please refer to our prior cancer genetics clinic note for more information regarding our discussion, assessment and recommendations, at the time. [name] recent genetic test results were disclosed to him, as were recommendations warranted by these results. These results and recommendations are discussed in more detail below.  CANCER HISTORY:  Oncology History Overview Note  # Malignant partially obstructing tumor in the transverse colon/70 cm proximal to the anus- C. COLON MASS, 70 CM; COLD BIOPSY:  - INTRAMUCOSAL ADENOCARCINOMA AT LEAST;  # One 20 mm polyp in the transverse colon, removed with mucosal resection. Resected and retrieved. Clips were placed. Tattooed.  # tumor in the mid rectum and at 10 cm proximal to the anus. Biopsied.      SEE COMMENT.   Comment:  There is no definitive evidence of invasion in this sample.  The  findings may not accurately represent the entire underlying lesion;  clinical correlation is recommended.   D. COLON POLYP, TRANSVERSE; HOT SNARE:  - TUBULOVILLOUS ADENOMA.  - NEGATIVE FOR HIGH GRADE DYSPLASIA AND MALIGNANCY.   E. RECTUM MASS; COLD BIOPSY:  - INVASIVE ADENOCARCINOMA, MODERATELY TO POORLY DIFFERENTIATED. ----------------------------------   # PET scan: SEP 2022-proximal right colon [adjacent mesenteric lymph nodes] and rectal hypermetabolism.  Additional subcentimeter bilateral hypermetabolic lymphadenopathy noted in the retroperitoneal/left external iliac; inferior right lobe of the liver concerning for metastatic disease; right lower quadrant soft tissue mass concerning for peritoneal carcinomatosis; bilateral solid pulmonary nodules ~5 mm; MRI rectum- T stage: T4a; N stage:  N1.   # 09/12-2020- FOLFOX chemo [Dr.White/Dr.Vanga]   Rectal cancer ([place])   09/11/2020 Initial Diagnosis   Rectal cancer ([place])   09/25/2020 -  Chemotherapy   Patient is on Treatment Plan : COLORECTAL FOLFOX q14d x 4 months     09/28/2020 Cancer Staging   Staging form: Colon and Rectum, AJCC 8th Edition - Clinical: Stage IVC (cT4a, cN1, cM1c) - Signed by Cammie Sickle, MD on 09/28/2020     Genetic Testing   Negative genetic testing. No pathogenic variants identified on the [facility] Multi-Cancer+RNA Panel. The report date is 11/05/2020.  The Multi-Cancer Panel + RNA offered by [facility] includes sequencing and/or deletion duplication testing of the following 84 genes: AIP, ALK, APC, ATM, AXIN2,BAP1,  BARD1, BLM, BMPR1A, BRCA1, BRCA2, BRIP1, CASR, CDC73, CDH1, CDK4, CDKN1B, CDKN1C, CDKN2A (p14ARF), CDKN2A (p16INK4a), CEBPA, CHEK2, CTNNA1, DICER1, DIS3L2, EGFR (c.2369C>T, p.Thr790Met variant only), EPCAM (Deletion/duplication testing only), FH, FLCN, GATA2, GPC3, GREM1 (Promoter region deletion/duplication testing only), HOXB13 (c.251G>A, p.Gly84Glu), HRAS, KIT, MAX, MEN1, MET, MITF (c.952G>A, p.Glu318Lys variant only), MLH1, MSH2, MSH3, MSH6, MUTYH, NBN, NF1, NF2, NTHL1, PALB2, PDGFRA, PHOX2B, PMS2, POLD1, POLE, POT1, PRKAR1A, PTCH1, PTEN, RAD50, RAD51C, RAD51D, RB1, RECQL4, RET, RUNX1, SDHAF2, SDHA (sequence changes only), SDHB, SDHC, SDHD, SMAD4, SMARCA4, SMARCB1, SMARCE1, STK11, SUFU, TERC, TERT, TMEM127, TP53, TSC1, TSC2, VHL, WRN and WT1.     FAMILY HISTORY:  We obtained a detailed, 4-generation family history.  Significant diagnoses are listed below: Family History  Problem Relation Age of Onset   Cancer Mother 6       colon   Cancer Sister        breast   Crohn's disease Sister    Cancer Daughter        anal   Crohn's disease Niece    CAD Neg Hx  Stroke Neg Hx    Diabetes Neg Hx    Prostate cancer Neg Hx    Kidney cancer Neg Hx    Bladder Cancer Neg Hx    [name] has 2 daughters (44 and 8). His oldest daughter had anal cancer at 44 and is  doing well. Patient had 5 sisters. One had pancreatic and breast cancer, unsure ages of diagnosis, she is living at 66. Another sister had breast cancer and died at 74. Her daughter had breast cancer over 35, unsure exact age.    [name] mother had colon cancer in her 33s. Patient had 4 maternal uncles, 1 aunt. An uncle and the aunt also had colon cancer. No known cancers in cousins or grandparents.   [name] father died at 11. No known cancers on this side of the family except for his paternal grandfather who had an unknown type of cancer.    [name] is unaware of previous family history of genetic testing for hereditary cancer risks. Patient's maternal ancestors are of Scottish/Irish descent, and paternal ancestors are of [place] descent. There is no reported Ashkenazi Jewish ancestry. There is no known consanguinity.    GENETIC TEST RESULTS: Genetic testing reported out on 11/05/2020 through the [facility] Multi-Cancer+RNA Panel cancer panel found no pathogenic mutations.   The Multi-Cancer Panel + RNA offered by [facility] includes sequencing and/or deletion duplication testing of the following 84 genes: AIP, ALK, APC, ATM, AXIN2,BAP1,  BARD1, BLM, BMPR1A, BRCA1, BRCA2, BRIP1, CASR, CDC73, CDH1, CDK4, CDKN1B, CDKN1C, CDKN2A (p14ARF), CDKN2A (p16INK4a), CEBPA, CHEK2, CTNNA1, DICER1, DIS3L2, EGFR (c.2369C>T, p.Thr790Met variant only), EPCAM (Deletion/duplication testing only), FH, FLCN, GATA2, GPC3, GREM1 (Promoter region deletion/duplication testing only), HOXB13 (c.251G>A, p.Gly84Glu), HRAS, KIT, MAX, MEN1, MET, MITF (c.952G>A, p.Glu318Lys variant only), MLH1, MSH2, MSH3, MSH6, MUTYH, NBN, NF1, NF2, NTHL1, PALB2, PDGFRA, PHOX2B, PMS2, POLD1, POLE, POT1, PRKAR1A, PTCH1, PTEN, RAD50, RAD51C, RAD51D, RB1, RECQL4, RET, RUNX1, SDHAF2, SDHA (sequence changes only), SDHB, SDHC, SDHD, SMAD4, SMARCA4, SMARCB1, SMARCE1, STK11, SUFU, TERC, TERT, TMEM127, TP53, TSC1, TSC2, VHL, WRN and WT1.  The test report  has been scanned into EPIC and is located under the Molecular Pathology section of the Results Review tab.  A portion of the result report is included below for reference.     We discussed that because current genetic testing is not perfect, it is possible there may be a gene mutation in one of these genes that current testing cannot detect, but that chance is small.  There could be another gene that has not yet been discovered, or that we have not yet tested, that is responsible for the cancer diagnoses in the family. It is also possible there is a hereditary cause for the cancer in the family that [name] did not inherit and therefore was not identified in his testing.  Therefore, it is important to remain in touch with cancer genetics in the future so that we can continue to offer [name] the most up to date genetic testing.   ADDITIONAL GENETIC TESTING: We discussed with [name] that his genetic testing was fairly extensive.  If there are genes identified to increase cancer risk that can be analyzed in the future, we would be happy to discuss and coordinate this testing at that time.    CANCER SCREENING RECOMMENDATIONS: [name]'s test result is considered negative (normal).  This means that we have not identified a hereditary cause for his  personal and family history of cancer at this time. Most  cancers happen by chance and this negative test suggests that his cancer may fall into this category.    While reassuring, this does not definitively rule out a hereditary predisposition to cancer. It is still possible that there could be genetic mutations that are undetectable by current technology. There could be genetic mutations in genes that have not been tested or identified to increase cancer risk.  Therefore, it is recommended he continue to follow the cancer management and screening guidelines provided by his oncology and primary healthcare provider.   An individual's cancer risk and medical  management are not determined by genetic test results alone. Overall cancer risk assessment incorporates additional factors, including personal medical history, family history, and any available genetic information that may result in a personalized plan for cancer prevention and surveillance.  RECOMMENDATIONS FOR FAMILY MEMBERS:  Relatives in this family might be at some increased risk of developing cancer, over the general population risk, simply due to the family history of cancer.  We recommended male relatives in this family have a yearly mammogram beginning at age 28, or 63 years younger than the earliest onset of cancer, an annual clinical breast exam, and perform monthly breast self-exams. Male relatives in this family should also have a gynecological exam as recommended by their primary provider.  All family members should be referred for colonoscopy starting at age 48.    It is also possible there is a hereditary cause for the cancer in [name]'s family that he did not inherit and therefore was not identified in him.  Based on [name]'s family history, we recommended his sister who had pancreatic cancer have genetic counseling and testing. [name] will let us know if we can be of any assistance in coordinating genetic counseling and/or testing for these family members.  FOLLOW-UP: Lastly, we discussed with Mr. Volkert that cancer genetics is a rapidly advancing field and it is possible that new genetic tests will be appropriate for him and/or his family members in the future. We encouraged him to remain in contact with cancer genetics on an annual basis so we can update his personal and family histories and let him know of advances in cancer genetics that may benefit this family.   Our contact number was provided. [name] questions were answered to his satisfaction, and he knows he is welcome to call us at anytime with additional questions or concerns.   Faith Rogue, MS,  [facility] Genetic Counselor [email or web address] Phone: [phone]

## 2020-11-06 NOTE — Telephone Encounter (Signed)
Revealed negative genetic testing.   We discussed that we do not know why he has  cancer or why there is cancer in the family. It could be due to a different gene that we are not testing, or something our current technology cannot pick up.  It will be important for him to keep in contact with genetics to learn if additional testing may be needed in the future.  

## 2020-11-07 ENCOUNTER — Inpatient Hospital Stay (HOSPITAL_BASED_OUTPATIENT_CLINIC_OR_DEPARTMENT_OTHER): Payer: Medicare HMO | Admitting: Internal Medicine

## 2020-11-07 ENCOUNTER — Inpatient Hospital Stay: Payer: Medicare HMO

## 2020-11-07 ENCOUNTER — Other Ambulatory Visit: Payer: Self-pay

## 2020-11-07 DIAGNOSIS — C20 Malignant neoplasm of rectum: Secondary | ICD-10-CM

## 2020-11-07 DIAGNOSIS — N183 Chronic kidney disease, stage 3 unspecified: Secondary | ICD-10-CM | POA: Diagnosis not present

## 2020-11-07 DIAGNOSIS — Z79899 Other long term (current) drug therapy: Secondary | ICD-10-CM | POA: Diagnosis not present

## 2020-11-07 DIAGNOSIS — D509 Iron deficiency anemia, unspecified: Secondary | ICD-10-CM | POA: Diagnosis not present

## 2020-11-07 DIAGNOSIS — Z5111 Encounter for antineoplastic chemotherapy: Secondary | ICD-10-CM | POA: Diagnosis not present

## 2020-11-07 LAB — URINALYSIS, COMPLETE (UACMP) WITH MICROSCOPIC
Bacteria, UA: NONE SEEN
Bilirubin Urine: NEGATIVE
Glucose, UA: NEGATIVE mg/dL
Hgb urine dipstick: NEGATIVE
Ketones, ur: NEGATIVE mg/dL
Leukocytes,Ua: NEGATIVE
Nitrite: NEGATIVE
Protein, ur: NEGATIVE mg/dL
Specific Gravity, Urine: 1.014 (ref 1.005–1.030)
pH: 5 (ref 5.0–8.0)

## 2020-11-07 LAB — CBC WITH DIFFERENTIAL/PLATELET
Abs Immature Granulocytes: 0.05 10*3/uL (ref 0.00–0.07)
Basophils Absolute: 0.1 10*3/uL (ref 0.0–0.1)
Basophils Relative: 1 %
Eosinophils Absolute: 0.3 10*3/uL (ref 0.0–0.5)
Eosinophils Relative: 2 %
HCT: 40.7 % (ref 39.0–52.0)
Hemoglobin: 12.4 g/dL — ABNORMAL LOW (ref 13.0–17.0)
Immature Granulocytes: 0 %
Lymphocytes Relative: 12 %
Lymphs Abs: 1.4 10*3/uL (ref 0.7–4.0)
MCH: 26.2 pg (ref 26.0–34.0)
MCHC: 30.5 g/dL (ref 30.0–36.0)
MCV: 85.9 fL (ref 80.0–100.0)
Monocytes Absolute: 1 10*3/uL (ref 0.1–1.0)
Monocytes Relative: 9 %
Neutro Abs: 8.5 10*3/uL — ABNORMAL HIGH (ref 1.7–7.7)
Neutrophils Relative %: 76 %
Platelets: 177 10*3/uL (ref 150–400)
RBC: 4.74 MIL/uL (ref 4.22–5.81)
RDW: 26.1 % — ABNORMAL HIGH (ref 11.5–15.5)
WBC: 11.3 10*3/uL — ABNORMAL HIGH (ref 4.0–10.5)
nRBC: 0 % (ref 0.0–0.2)

## 2020-11-07 LAB — COMPREHENSIVE METABOLIC PANEL
ALT: 17 U/L (ref 0–44)
AST: 24 U/L (ref 15–41)
Albumin: 3.9 g/dL (ref 3.5–5.0)
Alkaline Phosphatase: 130 U/L — ABNORMAL HIGH (ref 38–126)
Anion gap: 6 (ref 5–15)
BUN: 18 mg/dL (ref 8–23)
CO2: 26 mmol/L (ref 22–32)
Calcium: 9.1 mg/dL (ref 8.9–10.3)
Chloride: 104 mmol/L (ref 98–111)
Creatinine, Ser: 1.41 mg/dL — ABNORMAL HIGH (ref 0.61–1.24)
GFR, Estimated: 51 mL/min — ABNORMAL LOW (ref >60)
Glucose, Bld: 103 mg/dL — ABNORMAL HIGH (ref 70–99)
Potassium: 4.3 mmol/L (ref 3.5–5.1)
Sodium: 136 mmol/L (ref 135–145)
Total Bilirubin: 0.6 mg/dL (ref 0.3–1.2)
Total Protein: 7.1 g/dL (ref 6.5–8.1)

## 2020-11-07 MED ORDER — DEXTROSE 5 % IV SOLN
Freq: Once | INTRAVENOUS | Status: AC
  Filled 2020-11-07: qty 250

## 2020-11-07 MED ORDER — OXALIPLATIN CHEMO INJECTION 100 MG/20ML
85.0000 mg/m2 | Freq: Once | INTRAVENOUS | Status: AC
  Administered 2020-11-07: 165 mg via INTRAVENOUS
  Filled 2020-11-07: qty 33

## 2020-11-07 MED ORDER — HEPARIN SOD (PORK) LOCK FLUSH 100 UNIT/ML IV SOLN
500.0000 [IU] | Freq: Once | INTRAVENOUS | Status: DC | PRN
  Filled 2020-11-07: qty 5

## 2020-11-07 MED ORDER — SODIUM CHLORIDE 0.9 % IV SOLN
Freq: Once | INTRAVENOUS | Status: AC
  Filled 2020-11-07: qty 250

## 2020-11-07 MED ORDER — FLUOROURACIL CHEMO INJECTION 5 GM/100ML
2400.0000 mg/m2 | INTRAVENOUS | Status: DC
  Administered 2020-11-07: 4650 mg via INTRAVENOUS
  Filled 2020-11-07: qty 93

## 2020-11-07 MED ORDER — SODIUM CHLORIDE 0.9 % IV SOLN
10.0000 mg | Freq: Once | INTRAVENOUS | Status: AC
  Administered 2020-11-07: 10 mg via INTRAVENOUS
  Filled 2020-11-07: qty 10

## 2020-11-07 MED ORDER — PALONOSETRON HCL INJECTION 0.25 MG/5ML
0.2500 mg | Freq: Once | INTRAVENOUS | Status: AC
  Administered 2020-11-07: 0.25 mg via INTRAVENOUS
  Filled 2020-11-07: qty 5

## 2020-11-07 MED ORDER — SODIUM CHLORIDE 0.9 % IV SOLN
10.0000 mg/kg | INTRAVENOUS | Status: DC
  Administered 2020-11-07: 800 mg via INTRAVENOUS
  Filled 2020-11-07: qty 32

## 2020-11-07 NOTE — Assessment & Plan Note (Addendum)
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 PET scan: SEP 2022-proximal right colon [adjacent mesenteric lymph nodes] and rectal hypermetabolism + bilateral hypermetabolic lymphadenopathy noted in the retroperitoneal/left external iliac; inferior right lobe of the liver concerning for metastatic disease; right lower quadrant- peritoneal carcinomatosis; bilateral solid pulmonary nodules ~5 mm; MRI rectum- T stage: T4a; N stage:  N1. Currently on pallaitive chemo- FOLFOX; with cycle #3-add bev.   # proceed with FOLFOX #3 today; ADDED BEV. Labs today reviewed;  acceptable for treatment today. will discontinue 5FU+LV  Continue oxaliplatin; and add bevacizumab starting today.   # Anemia/iron deficiency hemoglobin ~12- STABLE. ; secondary to chronic GI bleeding/tumor.  Plan Venofer with pump off this week- and then we will hold venofer.   # Chronic kidney disease stage III-[creat 1.4] STABLE.   # DISPOSITION:  # FOLFOX + Bev today # Follow up in 2 weeks- MD: labs- cbc/cmp' FOLFOX+ bev; Pump off in 2 days--Dr.B

## 2020-11-07 NOTE — Progress Notes (Signed)
[facility] NOTE  Patient Care Team: [name], MD as PCP - General (Family Medicine) Clent Jacks, RN as Oncology Nurse Navigator  CHIEF COMPLAINTS/PURPOSE OF CONSULTATION: colon/rectal cancer  #  Oncology History Overview Note  # Malignant partially obstructing tumor in the transverse colon/70 cm proximal to the anus- C. COLON MASS, 70 CM; COLD BIOPSY:  - INTRAMUCOSAL ADENOCARCINOMA AT LEAST;  # One 20 mm polyp in the transverse colon, removed with mucosal resection. Resected and retrieved. Clips were placed. Tattooed.  # tumor in the mid rectum and at 10 cm proximal to the anus. Biopsied.      SEE COMMENT.   Comment:  There is no definitive evidence of invasion in this sample.  The  findings may not accurately represent the entire underlying lesion;  clinical correlation is recommended.   D. COLON POLYP, TRANSVERSE; HOT SNARE:  - TUBULOVILLOUS ADENOMA.  - NEGATIVE FOR HIGH GRADE DYSPLASIA AND MALIGNANCY.   E. RECTUM MASS; COLD BIOPSY:  - INVASIVE ADENOCARCINOMA, MODERATELY TO POORLY DIFFERENTIATED. ----------------------------------   # PET scan: SEP 2022-proximal right colon [adjacent mesenteric lymph nodes] and rectal hypermetabolism.  Additional subcentimeter bilateral hypermetabolic lymphadenopathy noted in the retroperitoneal/left external iliac; inferior right lobe of the liver concerning for metastatic disease; right lower quadrant soft tissue mass concerning for peritoneal carcinomatosis; bilateral solid pulmonary nodules ~5 mm; MRI rectum- T stage: T4a; N stage:  N1.   # 09/12-2020- FOLFOX chemo [Dr.White/Dr.Vanga]; ADDED BEV with cycle #3-DC 5-FU bolus+LV   Rectal cancer ([place])  09/11/2020 Initial Diagnosis   Rectal cancer ([place])   09/25/2020 -  Chemotherapy   Patient is on Treatment Plan : COLORECTAL FOLFOX q14d x 4 months     09/28/2020 Cancer Staging   Staging form: Colon and Rectum, AJCC 8th Edition - Clinical: Stage IVC  (cT4a, cN1, cM1c) - Signed by Cammie Sickle, MD on 09/28/2020     Genetic Testing   Negative genetic testing. No pathogenic variants identified on the [facility] Multi-Cancer+RNA Panel. The report date is 11/05/2020.  The Multi-Cancer Panel + RNA offered by [facility] includes sequencing and/or deletion duplication testing of the following 84 genes: AIP, ALK, APC, ATM, AXIN2,BAP1,  BARD1, BLM, BMPR1A, BRCA1, BRCA2, BRIP1, CASR, CDC73, CDH1, CDK4, CDKN1B, CDKN1C, CDKN2A (p14ARF), CDKN2A (p16INK4a), CEBPA, CHEK2, CTNNA1, DICER1, DIS3L2, EGFR (c.2369C>T, p.Thr790Met variant only), EPCAM (Deletion/duplication testing only), FH, FLCN, GATA2, GPC3, GREM1 (Promoter region deletion/duplication testing only), HOXB13 (c.251G>A, p.Gly84Glu), HRAS, KIT, MAX, MEN1, MET, MITF (c.952G>A, p.Glu318Lys variant only), MLH1, MSH2, MSH3, MSH6, MUTYH, NBN, NF1, NF2, NTHL1, PALB2, PDGFRA, PHOX2B, PMS2, POLD1, POLE, POT1, PRKAR1A, PTCH1, PTEN, RAD50, RAD51C, RAD51D, RB1, RECQL4, RET, RUNX1, SDHAF2, SDHA (sequence changes only), SDHB, SDHC, SDHD, SMAD4, SMARCA4, SMARCB1, SMARCE1, STK11, SUFU, TERC, TERT, TMEM127, TP53, TSC1, TSC2, VHL, WRN and WT1.      HISTORY OF PRESENTING ILLNESS: Ambulating independently.  Accompanied by his daughter.  [name] 75 y.o.  male i synchronous colon cancer/rectal cancer-stage IV-on FOLFOX chemotherapy is here for follow-up.  Patient's chemotherapy was held last week because of neutropenia-ANC 0.6.   No vomiting.  No fever no chills.  No significant tingling or numbness.  Review of Systems  Constitutional:  Negative for chills, diaphoresis, fever and weight loss.  HENT:  Negative for nosebleeds and sore throat.   Eyes:  Negative for double vision.  Respiratory:  Negative for cough, hemoptysis, sputum production, shortness of breath and wheezing.   Cardiovascular:  Negative for chest pain, palpitations, orthopnea and leg  swelling.  Gastrointestinal:  Negative for abdominal pain,  blood in stool, constipation, diarrhea, heartburn, melena, nausea and vomiting.  Genitourinary:  Negative for dysuria, frequency and urgency.  Skin: Negative.  Negative for itching and rash.  Neurological:  Negative for dizziness, tingling, focal weakness, weakness and headaches.  Endo/Heme/Allergies:  Does not bruise/bleed easily.  Psychiatric/Behavioral:  Negative for depression. The patient is not nervous/anxious and does not have insomnia.     MEDICAL HISTORY:  Past Medical History:  Diagnosis Date  . CAD (coronary artery disease) 06/2014   UA with NSTEMI - cath with 99% prox L circ s/p stent, EF 40% ([place], [place] [facility])  . Emphysema    mild  . Ex-smoker   . Family history of breast cancer   . Family history of colon cancer   . Family history of pancreatic cancer   . NSTEMI (non-ST elevated myocardial infarction) ([place]) 07/13/2014    SURGICAL HISTORY: Past Surgical History:  Procedure Laterality Date  . CARDIAC CATHETERIZATION N/A 07/14/2014   Left Heart Cath and Coronary Angiography with stent placement;  Surgeon: Teodoro Spray, MD  . CARDIAC CATHETERIZATION N/A 07/14/2014   Coronary Stent Intervention;  Surgeon: Yolonda Kida, MD  . COLONOSCOPY WITH PROPOFOL N/A 09/06/2020   Procedure: COLONOSCOPY WITH PROPOFOL;  Surgeon: Lin Landsman, MD;  Location: [facility] ENDOSCOPY;  Service: Gastroenterology;  Laterality: N/A;  . CYSTECTOMY     on back  . ESOPHAGOGASTRODUODENOSCOPY N/A 09/06/2020   Procedure: ESOPHAGOGASTRODUODENOSCOPY (EGD);  Surgeon: Lin Landsman, MD;  Location: [facility] ENDOSCOPY;  Service: Gastroenterology;  Laterality: N/A;  . INGUINAL HERNIA REPAIR Right 08/17/04  . IR IMAGING GUIDED PORT INSERTION  09/13/2020    SOCIAL HISTORY: Social History   Socioeconomic History  . Marital status: Married    Spouse name: Not on file  . Number of children: Not on file  . Years of education: Not on file  . Highest education level: Not on file  Occupational  History  . Not on file  Tobacco Use  . Smoking status: Former    Packs/day: 1.00    Years: 35.00    Pack years: 35.00    Types: Cigarettes    Quit date: 07/07/1998    Years since quitting: 22.3  . Smokeless tobacco: Never  Vaping Use  . Vaping Use: Never used  Substance and Sexual Activity  . Alcohol use: No  . Drug use: No  . Sexual activity: Not on file  Other Topics Concern  . 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    Social Determinants of Health   Financial Resource Strain: Low Risk   . Difficulty of Paying Living Expenses: Not hard at all  Food Insecurity: No Food Insecurity  . Worried About Charity fundraiser in the Last Year: Never true  . Ran Out of Food in the Last Year: Never true  Transportation Needs: No Transportation Needs  . Lack of Transportation (Medical): No  . Lack of Transportation (Non-Medical): No  Physical Activity: Inactive  . Days of Exercise per Week: 0 days  . Minutes of Exercise per Session:  0 min  Stress: No Stress Concern Present  . Feeling of Stress : Not at all  Social Connections: Not on file  Intimate Partner Violence: Not At Risk  . Fear of Current or Ex-Partner: No  . Emotionally Abused: No  . Physically Abused: No  . Sexually Abused: No    FAMILY HISTORY: Family History  Problem Relation Age of Onset  . Cancer Mother 57       colon  . Cancer Sister        breast  . Crohn's disease Sister   . Cancer Daughter        anal  . Crohn's disease Niece   . CAD Neg Hx   . Stroke Neg Hx   . Diabetes Neg Hx   . Prostate cancer Neg Hx   . Kidney  cancer Neg Hx   . Bladder Cancer Neg Hx     ALLERGIES:  has No Known Allergies.  MEDICATIONS:  Current Outpatient Medications  Medication Sig Dispense Refill  . aspirin EC 81 MG tablet Take 1 tablet (81 mg total) by mouth daily. 60 tablet 1  . atorvastatin (LIPITOR) 40 MG tablet Take 1 tablet (40 mg total) by mouth daily at 6 PM. 60 tablet 1  . Cholecalciferol (VITAMIN D3) 25 MCG (1000 UT) CAPS Take 1 capsule (1,000 Units total) by mouth daily. 30 capsule   . clopidogrel (PLAVIX) 75 MG tablet Take 1 tablet (75 mg total) by mouth daily. 60 tablet 1  . clopidogrel (PLAVIX) 75 MG tablet Take 1 tablet by mouth daily.    . Cyanocobalamin (B-12) 1000 MCG SUBL Place 1 tablet under the tongue daily.    . ferrous sulfate 324 (65 Fe) MG TBEC Take 1 tablet (325 mg total) by mouth every other day.    . lidocaine-prilocaine (EMLA) cream Apply 1 application topically as needed (to port a cath 1 hour prior to each chemotherapy). 30 g 3  . metoprolol tartrate (LOPRESSOR) 25 MG tablet Take 1 tablet (25 mg total) by mouth 2 (two) times daily. 60 tablet 1  . ondansetron (ZOFRAN) 8 MG tablet Take 1 tablet (8 mg total) by mouth every 8 (eight) hours as needed for nausea or vomiting. 20 tablet 3  . pantoprazole (PROTONIX) 40 MG tablet Take 1 tablet (40 mg total) by mouth 2 (two) times daily before a meal. 180 tablet 1  . prochlorperazine (COMPAZINE) 10 MG tablet Take 1 tablet (10 mg total) by mouth every 6 (six) hours as needed for nausea or vomiting. 30 tablet 3  . tadalafil (CIALIS) 20 MG tablet TAKE ONE TABLET BY MOUTH DAILY AS NEEDED FOR ERECTILE DYSFUNCTION 20 tablet 3   No current facility-administered medications for this visit.   Facility-Administered Medications Ordered in Other Visits  Medication Dose Route Frequency Provider Last Rate Last Admin  . 0.9 %  sodium chloride infusion   Intravenous Once Charlaine Dalton R, MD      . bevacizumab-awwb (MVASI) 800 mg in sodium chloride 0.9 % 100 mL chemo  infusion  10 mg/kg (Treatment Plan Recorded) Intravenous Q14 Days Cammie Sickle, MD      . dexamethasone (DECADRON) 10 mg in sodium chloride 0.9 % 50 mL IVPB  10 mg Intravenous Once Charlaine Dalton R, MD      . dextrose 5 % solution   Intravenous Once Charlaine Dalton R, MD      . fluorouracil (ADRUCIL) 4,650 mg in sodium chloride 0.9 % 57 mL chemo infusion  2,400  mg/m2 (Treatment Plan Recorded) Intravenous 1 day or 1 dose Charlaine Dalton R, MD      . heparin lock flush 100 unit/mL  500 Units Intracatheter Once PRN Cammie Sickle, MD      . oxaliplatin (ELOXATIN) 165 mg in dextrose 5 % 500 mL chemo infusion  85 mg/m2 (Treatment Plan Recorded) Intravenous Once Charlaine Dalton R, MD      . palonosetron (ALOXI) injection 0.25 mg  0.25 mg Intravenous Once Charlaine Dalton R, MD      . sodium chloride flush (NS) 0.9 % injection 10 mL  10 mL Intravenous PRN Cammie Sickle, MD   10 mL at 10/09/20 0855      .  PHYSICAL EXAMINATION: ECOG PERFORMANCE STATUS: 0 - Asymptomatic  Vitals:   11/07/20 0903  BP: (!) 152/84  Pulse: 68  Resp: 18  Temp: (!) 97.1 F (36.2 C)  SpO2: 97%   Filed Weights   11/07/20 0903  Weight: 162 lb 9.6 oz (73.8 kg)    Physical Exam Vitals and nursing note reviewed.  HENT:     Head: Normocephalic and atraumatic.     Mouth/Throat:     Pharynx: Oropharynx is clear.  Eyes:     Extraocular Movements: Extraocular movements intact.     Pupils: Pupils are equal, round, and reactive to light.  Cardiovascular:     Rate and Rhythm: Normal rate and regular rhythm.  Pulmonary:     Comments: Decreased breath sounds bilaterally.  Abdominal:     Palpations: Abdomen is soft.  Musculoskeletal:        General: Normal range of motion.     Cervical back: Normal range of motion.  Skin:    General: Skin is warm.  Neurological:     General: No focal deficit present.     Mental Status: He is alert and oriented to person, place, and time.   Psychiatric:        Behavior: Behavior normal.        Judgment: Judgment normal.     LABORATORY DATA:  I have reviewed the data as listed Lab Results  Component Value Date   WBC 11.3 (H) 11/07/2020   HGB 12.4 (L) 11/07/2020   HCT 40.7 11/07/2020   MCV 85.9 11/07/2020   PLT 177 11/07/2020   Recent Labs    10/09/20 0844 10/23/20 0909 11/07/20 0831  NA 138 135 136  K 3.7 3.8 4.3  CL 107 105 104  CO2 23 24 26   GLUCOSE 100* 97 103*  BUN 13 11 18   CREATININE 1.43* 1.40* 1.41*  CALCIUM 8.5* 8.6* 9.1  GFRNONAA 50* 52* 51*  PROT 6.4* 6.4* 7.1  ALBUMIN 3.5 3.5 3.9  AST 30 22 24   ALT 21 16 17   ALKPHOS 110 104 130*  BILITOT 0.5 0.2* 0.6    RADIOGRAPHIC STUDIES: I have personally reviewed the radiological images as listed and agreed with the findings in the report. No results found.  ASSESSMENT & PLAN:   Rectal cancer ([place])  # STAGE IV- Synchronous primaries a] rectal cancer-10cm-adenocarcinoma & b] transverse colon- 70cm- intramucosal adenoca] from anus. PET scan: SEP 2022-proximal right colon [adjacent mesenteric lymph nodes] and rectal hypermetabolism.  Additional subcentimeter bilateral hypermetabolic lymphadenopathy noted in the retroperitoneal/left external iliac; inferior right lobe of the liver concerning for metastatic disease; right lower quadrant soft tissue mass concerning for peritoneal carcinomatosis; bilateral solid pulmonary nodules ~5 mm; MRI rectum- T stage: T4a; N stage:  N1. Currently on pallaitive chemo- FOLFOX;  add bev   # proceed with FOLFOX #2 today. Labs today reviewed;  acceptable for treatment today. will discontinue 5FU+LV  Continue oxaliplatin; and add bevacizumab starting today.   # Anemia/iron deficiency hemoglobin ~12- STABLE. ; secondary to chronic GI bleeding/tumor.  Plan Venofer with pump off this week- and then we will hold venofer.   # Chronic kidney disease stage III-[creat 1.4] STABLE.   # DISPOSITION:  # FOLFOX + Bev today # Follow  up in 2 weeks- MD: labs- cbc/cmp' FOLFOX+ bev; Pump off in 2 days--Dr.B   All questions were answered. The patient knows to call the clinic with any problems, questions or concerns.    Cammie Sickle, MD 11/07/2020 9:39 AM

## 2020-11-07 NOTE — Progress Notes (Signed)
Pt received mvasi/folfox infusion in clinic today. Tolerated well. Adrucil home pump connected at d/c.

## 2020-11-07 NOTE — Patient Instructions (Addendum)
[facility]   Discharge Instructions: Thank you for choosing [facility] to provide your oncology and hematology care.  If you have a lab appointment with the [facility], please go directly to the [facility] and check in at the registration area.  Wear comfortable clothing and clothing appropriate for easy access to any Portacath or PICC line.   We strive to give you quality time with your provider. You may need to reschedule your appointment if you arrive late (15 or more minutes).  Arriving late affects you and other patients whose appointments are after yours.  Also, if you miss three or more appointments without notifying the office, you may be dismissed from the clinic at the provider's discretion.      For prescription refill requests, have your pharmacy contact our office and allow 72 hours for refills to be completed.    Today you received the following chemotherapy and/or immunotherapy agents - Mvasi, Adrucil      To help prevent nausea and vomiting after your treatment, we encourage you to take your nausea medication as directed.  BELOW ARE SYMPTOMS THAT SHOULD BE REPORTED IMMEDIATELY: *FEVER GREATER THAN 100.4 F (38 C) OR HIGHER *CHILLS OR SWEATING *NAUSEA AND VOMITING THAT IS NOT CONTROLLED WITH YOUR NAUSEA MEDICATION *UNUSUAL SHORTNESS OF BREATH *UNUSUAL BRUISING OR BLEEDING *URINARY PROBLEMS (pain or burning when urinating, or frequent urination) *BOWEL PROBLEMS (unusual diarrhea, constipation, pain near the anus) TENDERNESS IN MOUTH AND THROAT WITH OR WITHOUT PRESENCE OF ULCERS (sore throat, sores in mouth, or a toothache) UNUSUAL RASH, SWELLING OR PAIN  UNUSUAL VAGINAL DISCHARGE OR ITCHING   Items with * indicate a potential emergency and should be followed up as soon as possible or go to the Emergency Department if any problems should occur.  Please show the CHEMOTHERAPY ALERT CARD or IMMUNOTHERAPY ALERT CARD at  check-in to the Emergency Department and triage nurse.  Should you have questions after your visit or need to cancel or reschedule your appointment, please contact [facility]  [phone] and follow the prompts.  Office hours are 8:00 a.m. to 4:30 p.m. Monday - Friday. Please note that voicemails left after 4:00 p.m. may not be returned until the following business day.  We are closed weekends and major holidays. You have access to a nurse at all times for urgent questions. Please call the main number to the clinic [phone] and follow the prompts.  For any non-urgent questions, you may also contact your provider using MyChart. We now offer e-Visits for anyone 43 and older to request care online for non-urgent symptoms. For details visit [email or web address].   Also download the MyChart app! Go to the app store, search "MyChart", open the app, select [facility], and log in with your MyChart username and password.  Due to Covid, a mask is required upon entering the hospital/clinic. If you do not have a mask, one will be given to you upon arrival. For doctor visits, patients may have 1 support person aged 87 or older with them. For treatment visits, patients cannot have anyone with them due to current Covid guidelines and our immunocompromised population.   Dexamethasone injection What is this medication? DEXAMETHASONE (dex a METH a sone) is a corticosteroid. It is used to treat inflammation of the skin, joints, lungs, and other organs. Common conditions treated include asthma, allergies, and arthritis. It is also used for other conditions, like blood disorders and diseases of the adrenal  glands. This medicine may be used for other purposes; ask your health care provider or pharmacist if you have questions. COMMON BRAND NAME(S): Decadron, DoubleDex, ReadySharp Dexamethasone, Simplist Dexamethasone, Solurex What should I tell my care team before I take this  medication? They need to know if you have any of these conditions: Cushing's syndrome diabetes glaucoma heart disease high blood pressure infection like herpes, measles, tuberculosis, or chickenpox kidney disease liver disease mental illness myasthenia gravis osteoporosis previous heart attack seizures stomach or intestine problems thyroid disease an unusual or allergic reaction to dexamethasone, corticosteroids, other medicines, lactose, foods, dyes, or preservatives pregnant or trying to get pregnant breast-feeding How should I use this medication? This medicine is for injection into a muscle, joint, lesion, soft tissue, or vein. It is given by a health care professional in a hospital or clinic setting. Talk to your pediatrician regarding the use of this medicine in children. Special care may be needed. Overdosage: If you think you have taken too much of this medicine contact a poison control center or emergency room at once. NOTE: This medicine is only for you. Do not share this medicine with others. What if I miss a dose? This may not apply. If you are having a series of injections over a prolonged period, try not to miss an appointment. Call your doctor or health care professional to reschedule if you are unable to keep an appointment. What may interact with this medication? Do not take this medicine with any of the following medications: live virus vaccines This medicine may also interact with the following medications: aminoglutethimide amphotericin B aspirin and aspirin-like medicines certain antibiotics like erythromycin, clarithromycin, and troleandomycin certain antivirals for HIV or hepatitis certain medicines for seizures like carbamazepine, phenobarbital, phenytoin certain medicines to treat myasthenia gravis cholestyramine cyclosporine digoxin diuretics ephedrine male hormones, like estrogen or progestins and birth control pills insulin or other medicines  for diabetes isoniazid ketoconazole medicines that relax muscles for surgery mifepristone NSAIDs, medicines for pain and inflammation, like ibuprofen or naproxen rifampin skin tests for allergies thalidomide vaccines warfarin This list may not describe all possible interactions. Give your health care provider a list of all the medicines, herbs, non-prescription drugs, or dietary supplements you use. Also tell them if you smoke, drink alcohol, or use illegal drugs. Some items may interact with your medicine. What should I watch for while using this medication? Visit your health care professional for regular checks on your progress. Tell your health care professional if your symptoms do not start to get better or if they get worse. Your condition will be monitored carefully while you are receiving this medicine. Wear a medical ID bracelet or chain. Carry a card that describes your disease and details of your medicine and dosage times. This medicine may increase your risk of getting an infection. Call your health care professional for advice if you get a fever, chills, or sore throat, or other symptoms of a cold or flu. Do not treat yourself. Try to avoid being around people who are sick. Call your health care professional if you are around anyone with measles, chickenpox, or if you develop sores or blisters that do not heal properly. If you are going to need surgery or other procedures, tell your doctor or health care professional that you have taken this medicine within the last 12 months. Ask your doctor or health care professional about your diet. You may need to lower the amount of salt you eat. This medicine may increase blood  sugar. Ask your healthcare provider if changes in diet or medicines are needed if you have diabetes. What side effects may I notice from receiving this medication? Side effects that you should report to your doctor or health care professional as soon as  possible: allergic reactions like skin rash, itching or hives, swelling of the face, lips, or tongue bloody or black, tarry stools changes in emotions or moods changes in vision confusion, excitement, restlessness depressed mood eye pain hallucinations muscle weakness severe or sudden stomach or belly pain signs and symptoms of high blood sugar such as being more thirsty or hungry or having to urinate more than normal. You may also feel very tired or have blurry vision. signs and symptoms of infection like fever; chills; cough; sore throat; pain or trouble passing urine swelling of ankles, feet unusual bruising or bleeding wounds that do not heal Side effects that usually do not require medical attention (report to your doctor or health care professional if they continue or are bothersome): increased appetite increased growth of face or body hair headache nausea, vomiting pain, redness, or irritation at site where injected skin problems, acne, thin and shiny skin trouble sleeping weight gain This list may not describe all possible side effects. Call your doctor for medical advice about side effects. You may report side effects to FDA at 1-800-FDA-1088. Where should I keep my medication? This medicine is given in a hospital or clinic and will not be stored at home. NOTE: This sheet is a summary. It may not cover all possible information. If you have questions about this medicine, talk to your doctor, pharmacist, or health care provider.  2022 Elsevier/Gold Standard (2018-07-14 13:51:58)  Palonosetron Injection What is this medication? PALONOSETRON (pal oh NOE se tron) is used to prevent nausea and vomiting caused by chemotherapy. It also helps prevent delayed nausea and vomiting that may occur a few days after your treatment. This medicine may be used for other purposes; ask your health care provider or pharmacist if you have questions. COMMON BRAND NAME(S): Aloxi What should I  tell my care team before I take this medication? They need to know if you have any of these conditions: an unusual or allergic reaction to palonosetron, dolasetron, granisetron, ondansetron, other medicines, foods, dyes, or preservatives pregnant or trying to get pregnant breast-feeding How should I use this medication? This medicine is for infusion into a vein. It is given by a health care professional in a hospital or clinic setting. Talk to your pediatrician regarding the use of this medicine in children. While this drug may be prescribed for children as young as 1 month for selected conditions, precautions do apply. Overdosage: If you think you have taken too much of this medicine contact a poison control center or emergency room at once. NOTE: This medicine is only for you. Do not share this medicine with others. What if I miss a dose? This does not apply. What may interact with this medication? certain medicines for depression, anxiety, or psychotic disturbances fentanyl linezolid MAOIs like Carbex, Eldepryl, Marplan, Nardil, and Parnate methylene blue (injected into a vein) tramadol This list may not describe all possible interactions. Give your health care provider a list of all the medicines, herbs, non-prescription drugs, or dietary supplements you use. Also tell them if you smoke, drink alcohol, or use illegal drugs. Some items may interact with your medicine. What should I watch for while using this medication? Your condition will be monitored carefully while you are receiving this  medicine. What side effects may I notice from receiving this medication? Side effects that you should report to your doctor or health care professional as soon as possible: allergic reactions like skin rash, itching or hives, swelling of the face, lips, or tongue breathing problems confusion dizziness fast, irregular heartbeat fever and chills loss of balance or  coordination seizures sweating swelling of the hands and feet tremors unusually weak or tired Side effects that usually do not require medical attention (report to your doctor or health care professional if they continue or are bothersome): constipation or diarrhea headache This list may not describe all possible side effects. Call your doctor for medical advice about side effects. You may report side effects to FDA at 1-800-FDA-1088. Where should I keep my medication? This drug is given in a hospital or clinic and will not be stored at home. NOTE: This sheet is a summary. It may not cover all possible information. If you have questions about this medicine, talk to your doctor, pharmacist, or health care provider.  2022 Elsevier/Gold Standard (2012-11-06 10:38:36)  Bevacizumab injection What is this medication? BEVACIZUMAB (be va SIZ yoo mab) is a monoclonal antibody. It is used to treat many types of cancer. This medicine may be used for other purposes; ask your health care provider or pharmacist if you have questions. COMMON BRAND NAME(S): Avastin, MVASI, Noah Charon What should I tell my care team before I take this medication? They need to know if you have any of these conditions: diabetes heart disease high blood pressure history of coughing up blood prior anthracycline chemotherapy (e.g., doxorubicin, daunorubicin, epirubicin) recent or ongoing radiation therapy recent or planning to have surgery stroke an unusual or allergic reaction to bevacizumab, hamster proteins, mouse proteins, other medicines, foods, dyes, or preservatives pregnant or trying to get pregnant breast-feeding How should I use this medication? This medicine is for infusion into a vein. It is given by a health care professional in a hospital or clinic setting. Talk to your pediatrician regarding the use of this medicine in children. Special care may be needed. Overdosage: If you think you have taken too much  of this medicine contact a poison control center or emergency room at once. NOTE: This medicine is only for you. Do not share this medicine with others. What if I miss a dose? It is important not to miss your dose. Call your doctor or health care professional if you are unable to keep an appointment. What may interact with this medication? Interactions are not expected. This list may not describe all possible interactions. Give your health care provider a list of all the medicines, herbs, non-prescription drugs, or dietary supplements you use. Also tell them if you smoke, drink alcohol, or use illegal drugs. Some items may interact with your medicine. What should I watch for while using this medication? Your condition will be monitored carefully while you are receiving this medicine. You will need important blood work and urine testing done while you are taking this medicine. This medicine may increase your risk to bruise or bleed. Call your doctor or health care professional if you notice any unusual bleeding. Before having surgery, talk to your health care provider to make sure it is ok. This drug can increase the risk of poor healing of your surgical site or wound. You will need to stop this drug for 28 days before surgery. After surgery, wait at least 28 days before restarting this drug. Make sure the surgical site or wound is healed  enough before restarting this drug. Talk to your health care provider if questions. Do not become pregnant while taking this medicine or for 6 months after stopping it. Women should inform their doctor if they wish to become pregnant or think they might be pregnant. There is a potential for serious side effects to an unborn child. Talk to your health care professional or pharmacist for more information. Do not breast-feed an infant while taking this medicine and for 6 months after the last dose. This medicine has caused ovarian failure in some women. This medicine may  interfere with the ability to have a child. You should talk to your doctor or health care professional if you are concerned about your fertility. What side effects may I notice from receiving this medication? Side effects that you should report to your doctor or health care professional as soon as possible: allergic reactions like skin rash, itching or hives, swelling of the face, lips, or tongue chest pain or chest tightness chills coughing up blood high fever seizures severe constipation signs and symptoms of bleeding such as bloody or black, tarry stools; red or dark-brown urine; spitting up blood or brown material that looks like coffee grounds; red spots on the skin; unusual bruising or bleeding from the eye, gums, or nose signs and symptoms of a blood clot such as breathing problems; chest pain; severe, sudden headache; pain, swelling, warmth in the leg signs and symptoms of a stroke like changes in vision; confusion; trouble speaking or understanding; severe headaches; sudden numbness or weakness of the face, arm or leg; trouble walking; dizziness; loss of balance or coordination stomach pain sweating swelling of legs or ankles vomiting weight gain Side effects that usually do not require medical attention (report to your doctor or health care professional if they continue or are bothersome): back pain changes in taste decreased appetite dry skin nausea tiredness This list may not describe all possible side effects. Call your doctor for medical advice about side effects. You may report side effects to FDA at 1-800-FDA-1088. Where should I keep my medication? This drug is given in a hospital or clinic and will not be stored at home. NOTE: This sheet is a summary. It may not cover all possible information. If you have questions about this medicine, talk to your doctor, pharmacist, or health care provider.  2022 Elsevier/Gold Standard (2018-10-28 10:50:46)  Oxaliplatin  Injection What is this medication? OXALIPLATIN (ox AL i PLA tin) is a chemotherapy drug. It targets fast dividing cells, like cancer cells, and causes these cells to die. This medicine is used to treat cancers of the colon and rectum, and many other cancers. This medicine may be used for other purposes; ask your health care provider or pharmacist if you have questions. COMMON BRAND NAME(S): Eloxatin What should I tell my care team before I take this medication? They need to know if you have any of these conditions: heart disease history of irregular heartbeat liver disease low blood counts, like [place] cells, platelets, or red blood cells lung or breathing disease, like asthma take medicines that treat or prevent blood clots tingling of the fingers or toes, or other nerve disorder an unusual or allergic reaction to oxaliplatin, other chemotherapy, other medicines, foods, dyes, or preservatives pregnant or trying to get pregnant breast-feeding How should I use this medication? This drug is given as an infusion into a vein. It is administered in a hospital or clinic by a specially trained health care professional. Talk to your  pediatrician regarding the use of this medicine in children. Special care may be needed. Overdosage: If you think you have taken too much of this medicine contact a poison control center or emergency room at once. NOTE: This medicine is only for you. Do not share this medicine with others. What if I miss a dose? It is important not to miss a dose. Call your doctor or health care professional if you are unable to keep an appointment. What may interact with this medication? Do not take this medicine with any of the following medications: cisapride dronedarone pimozide thioridazine This medicine may also interact with the following medications: aspirin and aspirin-like medicines certain medicines that treat or prevent blood clots like warfarin, apixaban, dabigatran,  and rivaroxaban cisplatin cyclosporine diuretics medicines for infection like acyclovir, adefovir, amphotericin B, bacitracin, cidofovir, foscarnet, ganciclovir, gentamicin, pentamidine, vancomycin NSAIDs, medicines for pain and inflammation, like ibuprofen or naproxen other medicines that prolong the QT interval (an abnormal heart rhythm) pamidronate zoledronic acid This list may not describe all possible interactions. Give your health care provider a list of all the medicines, herbs, non-prescription drugs, or dietary supplements you use. Also tell them if you smoke, drink alcohol, or use illegal drugs. Some items may interact with your medicine. What should I watch for while using this medication? Your condition will be monitored carefully while you are receiving this medicine. You may need blood work done while you are taking this medicine. This medicine may make you feel generally unwell. This is not uncommon as chemotherapy can affect healthy cells as well as cancer cells. Report any side effects. Continue your course of treatment even though you feel ill unless your healthcare professional tells you to stop. This medicine can make you more sensitive to cold. Do not drink cold drinks or use ice. Cover exposed skin before coming in contact with cold temperatures or cold objects. When out in cold weather wear warm clothing and cover your mouth and nose to warm the air that goes into your lungs. Tell your doctor if you get sensitive to the cold. Do not become pregnant while taking this medicine or for 9 months after stopping it. Women should inform their health care professional if they wish to become pregnant or think they might be pregnant. Men should not father a child while taking this medicine and for 6 months after stopping it. There is potential for serious side effects to an unborn child. Talk to your health care professional for more information. Do not breast-feed a child while taking  this medicine or for 3 months after stopping it. This medicine has caused ovarian failure in some women. This medicine may make it more difficult to get pregnant. Talk to your health care professional if you are concerned about your fertility. This medicine has caused decreased sperm counts in some men. This may make it more difficult to father a child. Talk to your health care professional if you are concerned about your fertility. This medicine may increase your risk of getting an infection. Call your health care professional for advice if you get a fever, chills, or sore throat, or other symptoms of a cold or flu. Do not treat yourself. Try to avoid being around people who are sick. Avoid taking medicines that contain aspirin, acetaminophen, ibuprofen, naproxen, or ketoprofen unless instructed by your health care professional. These medicines may hide a fever. Be careful brushing or flossing your teeth or using a toothpick because you may get an infection or bleed  more easily. If you have any dental work done, tell your dentist you are receiving this medicine. What side effects may I notice from receiving this medication? Side effects that you should report to your doctor or health care professional as soon as possible: allergic reactions like skin rash, itching or hives, swelling of the face, lips, or tongue breathing problems cough low blood counts - this medicine may decrease the number of [place] blood cells, red blood cells, and platelets. You may be at increased risk for infections and bleeding nausea, vomiting pain, redness, or irritation at site where injected pain, tingling, numbness in the hands or feet signs and symptoms of bleeding such as bloody or black, tarry stools; red or dark brown urine; spitting up blood or brown material that looks like coffee grounds; red spots on the skin; unusual bruising or bleeding from the eyes, gums, or nose signs and symptoms of a dangerous change in  heartbeat or heart rhythm like chest pain; dizziness; fast, irregular heartbeat; palpitations; feeling faint or lightheaded; falls signs and symptoms of infection like fever; chills; cough; sore throat; pain or trouble passing urine signs and symptoms of liver injury like dark yellow or brown urine; general ill feeling or flu-like symptoms; light-colored stools; loss of appetite; nausea; right upper belly pain; unusually weak or tired; yellowing of the eyes or skin signs and symptoms of low red blood cells or anemia such as unusually weak or tired; feeling faint or lightheaded; falls signs and symptoms of muscle injury like dark urine; trouble passing urine or change in the amount of urine; unusually weak or tired; muscle pain; back pain Side effects that usually do not require medical attention (report to your doctor or health care professional if they continue or are bothersome): changes in taste diarrhea gas hair loss loss of appetite mouth sores This list may not describe all possible side effects. Call your doctor for medical advice about side effects. You may report side effects to FDA at 1-800-FDA-1088. Where should I keep my medication? This drug is given in a hospital or clinic and will not be stored at home. NOTE: This sheet is a summary. It may not cover all possible information. If you have questions about this medicine, talk to your doctor, pharmacist, or health care provider.  2022 Elsevier/Gold Standard (2018-05-20 12:20:35)   Fluorouracil, 5-FU injection What is this medication? FLUOROURACIL, 5-FU (flure oh YOOR a sil) is a chemotherapy drug. It slows the growth of cancer cells. This medicine is used to treat many types of cancer like breast cancer, colon or rectal cancer, pancreatic cancer, and stomach cancer. This medicine may be used for other purposes; ask your health care provider or pharmacist if you have questions. COMMON BRAND NAME(S): Adrucil What should I tell my  care team before I take this medication? They need to know if you have any of these conditions: blood disorders dihydropyrimidine dehydrogenase (DPD) deficiency infection (especially a virus infection such as chickenpox, cold sores, or herpes) kidney disease liver disease malnourished, poor nutrition recent or ongoing radiation therapy an unusual or allergic reaction to fluorouracil, other chemotherapy, other medicines, foods, dyes, or preservatives pregnant or trying to get pregnant breast-feeding How should I use this medication? This drug is given as an infusion or injection into a vein. It is administered in a hospital or clinic by a specially trained health care professional. Talk to your pediatrician regarding the use of this medicine in children. Special care may be needed. Overdosage: If you  think you have taken too much of this medicine contact a poison control center or emergency room at once. NOTE: This medicine is only for you. Do not share this medicine with others. What if I miss a dose? It is important not to miss your dose. Call your doctor or health care professional if you are unable to keep an appointment. What may interact with this medication? Do not take this medicine with any of the following medications: live virus vaccines This medicine may also interact with the following medications: medicines that treat or prevent blood clots like warfarin, enoxaparin, and dalteparin This list may not describe all possible interactions. Give your health care provider a list of all the medicines, herbs, non-prescription drugs, or dietary supplements you use. Also tell them if you smoke, drink alcohol, or use illegal drugs. Some items may interact with your medicine. What should I watch for while using this medication? Visit your doctor for checks on your progress. This drug may make you feel generally unwell. This is not uncommon, as chemotherapy can affect healthy cells as well  as cancer cells. Report any side effects. Continue your course of treatment even though you feel ill unless your doctor tells you to stop. In some cases, you may be given additional medicines to help with side effects. Follow all directions for their use. Call your doctor or health care professional for advice if you get a fever, chills or sore throat, or other symptoms of a cold or flu. Do not treat yourself. This drug decreases your body's ability to fight infections. Try to avoid being around people who are sick. This medicine may increase your risk to bruise or bleed. Call your doctor or health care professional if you notice any unusual bleeding. Be careful brushing and flossing your teeth or using a toothpick because you may get an infection or bleed more easily. If you have any dental work done, tell your dentist you are receiving this medicine. Avoid taking products that contain aspirin, acetaminophen, ibuprofen, naproxen, or ketoprofen unless instructed by your doctor. These medicines may hide a fever. Do not become pregnant while taking this medicine. Women should inform their doctor if they wish to become pregnant or think they might be pregnant. There is a potential for serious side effects to an unborn child. Talk to your health care professional or pharmacist for more information. Do not breast-feed an infant while taking this medicine. Men should inform their doctor if they wish to father a child. This medicine may lower sperm counts. Do not treat diarrhea with over the counter products. Contact your doctor if you have diarrhea that lasts more than 2 days or if it is severe and watery. This medicine can make you more sensitive to the sun. Keep out of the sun. If you cannot avoid being in the sun, wear protective clothing and use sunscreen. Do not use sun lamps or tanning beds/booths. What side effects may I notice from receiving this medication? Side effects that you should report to your  doctor or health care professional as soon as possible: allergic reactions like skin rash, itching or hives, swelling of the face, lips, or tongue low blood counts - this medicine may decrease the number of [place] blood cells, red blood cells and platelets. You may be at increased risk for infections and bleeding. signs of infection - fever or chills, cough, sore throat, pain or difficulty passing urine signs of decreased platelets or bleeding - bruising, pinpoint red spots on  the skin, black, tarry stools, blood in the urine signs of decreased red blood cells - unusually weak or tired, fainting spells, lightheadedness breathing problems changes in vision chest pain mouth sores nausea and vomiting pain, swelling, redness at site where injected pain, tingling, numbness in the hands or feet redness, swelling, or sores on hands or feet stomach pain unusual bleeding Side effects that usually do not require medical attention (report to your doctor or health care professional if they continue or are bothersome): changes in finger or toe nails diarrhea dry or itchy skin hair loss headache loss of appetite sensitivity of eyes to the light stomach upset unusually teary eyes This list may not describe all possible side effects. Call your doctor for medical advice about side effects. You may report side effects to FDA at 1-800-FDA-1088. Where should I keep my medication? This drug is given in a hospital or clinic and will not be stored at home. NOTE: This sheet is a summary. It may not cover all possible information. If you have questions about this medicine, talk to your doctor, pharmacist, or health care provider.  2022 Elsevier/Gold Standard (2018-12-01 15:00:03)

## 2020-11-09 ENCOUNTER — Inpatient Hospital Stay: Payer: Medicare HMO

## 2020-11-09 ENCOUNTER — Other Ambulatory Visit: Payer: Self-pay

## 2020-11-09 VITALS — BP 138/81 | HR 62 | Temp 96.0°F | Resp 18

## 2020-11-09 DIAGNOSIS — Z5111 Encounter for antineoplastic chemotherapy: Secondary | ICD-10-CM | POA: Diagnosis not present

## 2020-11-09 DIAGNOSIS — D509 Iron deficiency anemia, unspecified: Secondary | ICD-10-CM | POA: Diagnosis not present

## 2020-11-09 DIAGNOSIS — C20 Malignant neoplasm of rectum: Secondary | ICD-10-CM

## 2020-11-09 DIAGNOSIS — N183 Chronic kidney disease, stage 3 unspecified: Secondary | ICD-10-CM | POA: Diagnosis not present

## 2020-11-09 DIAGNOSIS — D500 Iron deficiency anemia secondary to blood loss (chronic): Secondary | ICD-10-CM

## 2020-11-09 DIAGNOSIS — Z79899 Other long term (current) drug therapy: Secondary | ICD-10-CM | POA: Diagnosis not present

## 2020-11-09 MED ORDER — SODIUM CHLORIDE 0.9% FLUSH
10.0000 mL | INTRAVENOUS | Status: DC | PRN
  Administered 2020-11-09: 10 mL
  Filled 2020-11-09: qty 10

## 2020-11-09 MED ORDER — SODIUM CHLORIDE 0.9 % IV SOLN
Freq: Once | INTRAVENOUS | Status: AC
  Filled 2020-11-09: qty 250

## 2020-11-09 MED ORDER — SODIUM CHLORIDE 0.9 % IV SOLN: 200.0000 mg | Freq: Once | INTRAVENOUS | Status: DC

## 2020-11-09 MED ORDER — IRON SUCROSE 20 MG/ML IV SOLN
200.0000 mg | Freq: Once | INTRAVENOUS | Status: AC
Start: 2020-11-09 — End: 2020-11-09
  Administered 2020-11-09: 200 mg via INTRAVENOUS
  Filled 2020-11-09: qty 10

## 2020-11-09 MED ORDER — HEPARIN SOD (PORK) LOCK FLUSH 100 UNIT/ML IV SOLN
500.0000 [IU] | Freq: Once | INTRAVENOUS | Status: AC | PRN
  Administered 2020-11-09: 500 [IU]
  Filled 2020-11-09: qty 5

## 2020-11-09 NOTE — Patient Instructions (Signed)

## 2020-11-15 ENCOUNTER — Other Ambulatory Visit: Payer: Self-pay | Admitting: Urology

## 2020-11-15 DIAGNOSIS — N529 Male erectile dysfunction, unspecified: Secondary | ICD-10-CM

## 2020-11-15 DIAGNOSIS — N401 Enlarged prostate with lower urinary tract symptoms: Secondary | ICD-10-CM

## 2020-11-15 DIAGNOSIS — N138 Other obstructive and reflux uropathy: Secondary | ICD-10-CM

## 2020-11-20 ENCOUNTER — Ambulatory Visit: Payer: Medicare HMO

## 2020-11-20 ENCOUNTER — Ambulatory Visit: Payer: Medicare HMO | Admitting: Internal Medicine

## 2020-11-20 ENCOUNTER — Other Ambulatory Visit: Payer: Medicare HMO

## 2020-11-21 ENCOUNTER — Inpatient Hospital Stay: Payer: Medicare HMO

## 2020-11-21 ENCOUNTER — Other Ambulatory Visit: Payer: Self-pay | Admitting: *Deleted

## 2020-11-21 ENCOUNTER — Inpatient Hospital Stay: Payer: Medicare HMO | Attending: Internal Medicine

## 2020-11-21 ENCOUNTER — Inpatient Hospital Stay (HOSPITAL_BASED_OUTPATIENT_CLINIC_OR_DEPARTMENT_OTHER): Payer: Medicare HMO | Admitting: Internal Medicine

## 2020-11-21 ENCOUNTER — Other Ambulatory Visit: Payer: Self-pay

## 2020-11-21 VITALS — BP 153/90 | HR 63 | Temp 96.5°F | Resp 18 | Wt 166.0 lb

## 2020-11-21 VITALS — BP 158/87 | HR 67 | Resp 18

## 2020-11-21 DIAGNOSIS — C20 Malignant neoplasm of rectum: Secondary | ICD-10-CM

## 2020-11-21 DIAGNOSIS — N183 Chronic kidney disease, stage 3 unspecified: Secondary | ICD-10-CM | POA: Insufficient documentation

## 2020-11-21 DIAGNOSIS — Z5111 Encounter for antineoplastic chemotherapy: Secondary | ICD-10-CM | POA: Insufficient documentation

## 2020-11-21 DIAGNOSIS — Z79899 Other long term (current) drug therapy: Secondary | ICD-10-CM | POA: Insufficient documentation

## 2020-11-21 DIAGNOSIS — I129 Hypertensive chronic kidney disease with stage 1 through stage 4 chronic kidney disease, or unspecified chronic kidney disease: Secondary | ICD-10-CM | POA: Diagnosis not present

## 2020-11-21 DIAGNOSIS — D509 Iron deficiency anemia, unspecified: Secondary | ICD-10-CM | POA: Diagnosis not present

## 2020-11-21 LAB — COMPREHENSIVE METABOLIC PANEL
ALT: 21 U/L (ref 0–44)
AST: 27 U/L (ref 15–41)
Albumin: 3.7 g/dL (ref 3.5–5.0)
Alkaline Phosphatase: 126 U/L (ref 38–126)
Anion gap: 5 (ref 5–15)
BUN: 15 mg/dL (ref 8–23)
CO2: 25 mmol/L (ref 22–32)
Calcium: 8.5 mg/dL — ABNORMAL LOW (ref 8.9–10.3)
Chloride: 107 mmol/L (ref 98–111)
Creatinine, Ser: 1.44 mg/dL — ABNORMAL HIGH (ref 0.61–1.24)
GFR, Estimated: 50 mL/min — ABNORMAL LOW (ref >60)
Glucose, Bld: 90 mg/dL (ref 70–99)
Potassium: 4.2 mmol/L (ref 3.5–5.1)
Sodium: 137 mmol/L (ref 135–145)
Total Bilirubin: 0.3 mg/dL (ref 0.3–1.2)
Total Protein: 6.4 g/dL — ABNORMAL LOW (ref 6.5–8.1)

## 2020-11-21 LAB — URINALYSIS, COMPLETE (UACMP) WITH MICROSCOPIC
Bacteria, UA: NONE SEEN
Bilirubin Urine: NEGATIVE
Glucose, UA: NEGATIVE mg/dL
Hgb urine dipstick: NEGATIVE
Ketones, ur: NEGATIVE mg/dL
Leukocytes,Ua: NEGATIVE
Nitrite: NEGATIVE
Protein, ur: NEGATIVE mg/dL
Specific Gravity, Urine: 1.012 (ref 1.005–1.030)
WBC, UA: NONE SEEN WBC/hpf (ref 0–5)
pH: 5 (ref 5.0–8.0)

## 2020-11-21 LAB — CBC WITH DIFFERENTIAL/PLATELET
Abs Immature Granulocytes: 0.01 10*3/uL (ref 0.00–0.07)
Basophils Absolute: 0.1 10*3/uL (ref 0.0–0.1)
Basophils Relative: 2 %
Eosinophils Absolute: 0.7 10*3/uL — ABNORMAL HIGH (ref 0.0–0.5)
Eosinophils Relative: 10 %
HCT: 38.2 % — ABNORMAL LOW (ref 39.0–52.0)
Hemoglobin: 11.8 g/dL — ABNORMAL LOW (ref 13.0–17.0)
Immature Granulocytes: 0 %
Lymphocytes Relative: 21 %
Lymphs Abs: 1.4 10*3/uL (ref 0.7–4.0)
MCH: 27.3 pg (ref 26.0–34.0)
MCHC: 30.9 g/dL (ref 30.0–36.0)
MCV: 88.2 fL (ref 80.0–100.0)
Monocytes Absolute: 0.8 10*3/uL (ref 0.1–1.0)
Monocytes Relative: 12 %
Neutro Abs: 3.6 10*3/uL (ref 1.7–7.7)
Neutrophils Relative %: 55 %
Platelets: 127 10*3/uL — ABNORMAL LOW (ref 150–400)
RBC: 4.33 MIL/uL (ref 4.22–5.81)
RDW: 25.2 % — ABNORMAL HIGH (ref 11.5–15.5)
WBC: 6.5 10*3/uL (ref 4.0–10.5)
nRBC: 0 % (ref 0.0–0.2)

## 2020-11-21 MED ORDER — PALONOSETRON HCL INJECTION 0.25 MG/5ML
0.2500 mg | Freq: Once | INTRAVENOUS | Status: AC
  Administered 2020-11-21: 0.25 mg via INTRAVENOUS
  Filled 2020-11-21: qty 5

## 2020-11-21 MED ORDER — SODIUM CHLORIDE 0.9 % IV SOLN
10.0000 mg/kg | INTRAVENOUS | Status: DC
  Administered 2020-11-21: 800 mg via INTRAVENOUS
  Filled 2020-11-21: qty 32

## 2020-11-21 MED ORDER — OXALIPLATIN CHEMO INJECTION 100 MG/20ML
85.0000 mg/m2 | Freq: Once | INTRAVENOUS | Status: AC
  Administered 2020-11-21: 165 mg via INTRAVENOUS
  Filled 2020-11-21: qty 33

## 2020-11-21 MED ORDER — SODIUM CHLORIDE 0.9 % IV SOLN
10.0000 mg | Freq: Once | INTRAVENOUS | Status: AC
  Administered 2020-11-21: 10 mg via INTRAVENOUS
  Filled 2020-11-21: qty 10

## 2020-11-21 MED ORDER — DEXTROSE 5 % IV SOLN
Freq: Once | INTRAVENOUS | Status: AC
  Filled 2020-11-21: qty 250

## 2020-11-21 MED ORDER — SODIUM CHLORIDE 0.9 % IV SOLN
Freq: Once | INTRAVENOUS | Status: AC
  Filled 2020-11-21: qty 250

## 2020-11-21 MED ORDER — SODIUM CHLORIDE 0.9% FLUSH
10.0000 mL | INTRAVENOUS | Status: DC | PRN
  Administered 2020-11-21: 10 mL
  Filled 2020-11-21: qty 10

## 2020-11-21 MED ORDER — SODIUM CHLORIDE 0.9 % IV SOLN
2400.0000 mg/m2 | INTRAVENOUS | Status: DC
  Administered 2020-11-21: 4650 mg via INTRAVENOUS
  Filled 2020-11-21: qty 93

## 2020-11-21 NOTE — Progress Notes (Signed)
[facility] NOTE  Patient Care Team: [name], MD as PCP - General (Family Medicine) Clent Jacks, RN as Oncology Nurse Navigator  CHIEF COMPLAINTS/PURPOSE OF CONSULTATION: colon/rectal cancer  #  Oncology History Overview Note  # Malignant partially obstructing tumor in the transverse colon/70 cm proximal to the anus- C. COLON MASS, 70 CM; COLD BIOPSY:  - INTRAMUCOSAL ADENOCARCINOMA AT LEAST;  # One 20 mm polyp in the transverse colon, removed with mucosal resection. Resected and retrieved. Clips were placed. Tattooed.  # tumor in the mid rectum and at 10 cm proximal to the anus. Biopsied.      SEE COMMENT.   Comment:  There is no definitive evidence of invasion in this sample.  The  findings may not accurately represent the entire underlying lesion;  clinical correlation is recommended.   D. COLON POLYP, TRANSVERSE; HOT SNARE:  - TUBULOVILLOUS ADENOMA.  - NEGATIVE FOR HIGH GRADE DYSPLASIA AND MALIGNANCY.   E. RECTUM MASS; COLD BIOPSY:  - INVASIVE ADENOCARCINOMA, MODERATELY TO POORLY DIFFERENTIATED. ----------------------------------   # PET scan: SEP 2022-proximal right colon [adjacent mesenteric lymph nodes] and rectal hypermetabolism.  Additional subcentimeter bilateral hypermetabolic lymphadenopathy noted in the retroperitoneal/left external iliac; inferior right lobe of the liver concerning for metastatic disease; right lower quadrant soft tissue mass concerning for peritoneal carcinomatosis; bilateral solid pulmonary nodules ~5 mm; MRI rectum- T stage: T4a; N stage:  N1.   # 09/12-2020- FOLFOX chemo [Dr.White/Dr.Vanga]; ADDED BEV with cycle #3-DC 5-FU bolus+LV   Rectal cancer ([place])  09/11/2020 Initial Diagnosis   Rectal cancer ([place])   09/25/2020 -  Chemotherapy   Patient is on Treatment Plan : COLORECTAL FOLFOX q14d x 4 months     09/28/2020 Cancer Staging   Staging form: Colon and Rectum, AJCC 8th Edition - Clinical: Stage IVC  (cT4a, cN1, cM1c) - Signed by Cammie Sickle, MD on 09/28/2020     Genetic Testing   Negative genetic testing. No pathogenic variants identified on the [facility] Multi-Cancer+RNA Panel. The report date is 11/05/2020.  The Multi-Cancer Panel + RNA offered by [facility] includes sequencing and/or deletion duplication testing of the following 84 genes: AIP, ALK, APC, ATM, AXIN2,BAP1,  BARD1, BLM, BMPR1A, BRCA1, BRCA2, BRIP1, CASR, CDC73, CDH1, CDK4, CDKN1B, CDKN1C, CDKN2A (p14ARF), CDKN2A (p16INK4a), CEBPA, CHEK2, CTNNA1, DICER1, DIS3L2, EGFR (c.2369C>T, p.Thr790Met variant only), EPCAM (Deletion/duplication testing only), FH, FLCN, GATA2, GPC3, GREM1 (Promoter region deletion/duplication testing only), HOXB13 (c.251G>A, p.Gly84Glu), HRAS, KIT, MAX, MEN1, MET, MITF (c.952G>A, p.Glu318Lys variant only), MLH1, MSH2, MSH3, MSH6, MUTYH, NBN, NF1, NF2, NTHL1, PALB2, PDGFRA, PHOX2B, PMS2, POLD1, POLE, POT1, PRKAR1A, PTCH1, PTEN, RAD50, RAD51C, RAD51D, RB1, RECQL4, RET, RUNX1, SDHAF2, SDHA (sequence changes only), SDHB, SDHC, SDHD, SMAD4, SMARCA4, SMARCB1, SMARCE1, STK11, SUFU, TERC, TERT, TMEM127, TP53, TSC1, TSC2, VHL, WRN and WT1.      HISTORY OF PRESENTING ILLNESS: Ambulating independently.  Accompanied by his daughter.  Albert Giles 75 y.o.  male i synchronous colon cancer/rectal cancer-stage IV-on FOLFOX chemotherapy is here for follow-up.   No vomiting.  No fever no chills.  No significant tingling or numbness.  Patient is gaining weight.  No nausea no vomiting.  Review of Systems  Constitutional:  Negative for chills, diaphoresis, fever and weight loss.  HENT:  Negative for nosebleeds and sore throat.   Eyes:  Negative for double vision.  Respiratory:  Negative for cough, hemoptysis, sputum production, shortness of breath and wheezing.   Cardiovascular:  Negative for chest pain, palpitations, orthopnea and leg swelling.  Gastrointestinal:  Negative for abdominal pain, blood in stool,  constipation, diarrhea, heartburn, melena, nausea and vomiting.  Genitourinary:  Negative for dysuria, frequency and urgency.  Skin: Negative.  Negative for itching and rash.  Neurological:  Negative for dizziness, tingling, focal weakness, weakness and headaches.  Endo/Heme/Allergies:  Does not bruise/bleed easily.  Psychiatric/Behavioral:  Negative for depression. The patient is not nervous/anxious and does not have insomnia.     MEDICAL HISTORY:  Past Medical History:  Diagnosis Date   CAD (coronary artery disease) 06/2014   UA with NSTEMI - cath with 99% prox L circ s/p stent, EF 40% ([place], [place] [facility])   Emphysema    mild   Ex-smoker    Family history of breast cancer    Family history of colon cancer    Family history of pancreatic cancer    NSTEMI (non-ST elevated myocardial infarction) ([place]) 07/13/2014    SURGICAL HISTORY: Past Surgical History:  Procedure Laterality Date   CARDIAC CATHETERIZATION N/A 07/14/2014   Left Heart Cath and Coronary Angiography with stent placement;  Surgeon: Teodoro Spray, MD   CARDIAC CATHETERIZATION N/A 07/14/2014   Coronary Stent Intervention;  Surgeon: Yolonda Kida, MD   COLONOSCOPY WITH PROPOFOL N/A 09/06/2020   Procedure: COLONOSCOPY WITH PROPOFOL;  Surgeon: Lin Landsman, MD;  [facility] END[facility]COPY;  Service: Gastroenterology;  Laterality: N/A;   CYSTECTOMY     on back   ESOPHAGOGASTRODUODENOSCOPY N/A 09/06/2020   Procedure: ESOPHAGOGASTRODUODENOSCOPY (EGD);  Surgeon: Lin Landsman, MD;  Location: [facility] ENDOSCOPY;  Service: Gastroenterology;  Laterality: N/A;   INGUINAL HERNIA REPAIR Right 08/17/04   IR IMAGING GUIDED PORT INSERTION  09/13/2020    SOCIAL HISTORY: Social History   Socioeconomic History   Marital status: Married    Spouse name: Not on file   Number of children: Not on file   Years of education: Not on file   Highest education level: Not on file  Occupational History   Not on file  Tobacco Use    Smoking status: Former    Packs/day: 1.00    Years: 35.00    Pack years: 35.00    Types: Cigarettes    Quit date: 07/07/1998    Years since quitting: 22.3   Smokeless tobacco: Never  Vaping Use   Vaping Use: Never used  Substance and Sexual Activity   Alcohol use: No   Drug use: No   Sexual activity: Not on file  Other Topics Concern   Not on file  Social History Narrative   Caffeine: 2 cups coffee, 2 cups tea/day   Lives with wife   Occupation: Higher education careers adviser, retired   Edu: HS   Activity: works in yard, walking   Diet: some water, fruits/vegetables daily   -------------------------------------------------------------------       [employer]; [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    Social Determinants of Health   Financial Resource Strain: Low Risk    Difficulty of Paying Living Expenses: Not hard at all  Food Insecurity: No Food Insecurity   Worried About Charity fundraiser in the Last Year: Never true   [place] in the Last Year: Never true  Transportation Needs: No Transportation Needs   Lack of Transportation (Medical): No   Lack of Transportation (Non-Medical): No  Physical Activity: Inactive   Days of Exercise per Week: 0 days   Minutes of Exercise per Session: 0 min  Stress: No Stress Concern Present   Feeling of Stress : Not at all  Social Connections: Not on file  Intimate Partner Violence: Not At Risk   Fear of Current or Ex-Partner: No   Emotionally Abused: No   Physically Abused: No   Sexually Abused: No    FAMILY HISTORY: Family History  Problem Relation Age of Onset   Cancer Mother 51       colon   Cancer Sister        breast   Crohn's disease Sister    Cancer Daughter        anal   Crohn's disease Niece    CAD Neg Hx    Stroke Neg Hx    Diabetes Neg Hx    Prostate cancer Neg Hx    Kidney cancer Neg Hx    Bladder Cancer Neg Hx     ALLERGIES:  has No  Known Allergies.  MEDICATIONS:  Current Outpatient Medications  Medication Sig Dispense Refill   aspirin EC 81 MG tablet Take 1 tablet (81 mg total) by mouth daily. 60 tablet 1   atorvastatin (LIPITOR) 40 MG tablet Take 1 tablet (40 mg total) by mouth daily at 6 PM. 60 tablet 1   Cholecalciferol (VITAMIN D3) 25 MCG (1000 UT) CAPS Take 1 capsule (1,000 Units total) by mouth daily. 30 capsule    clopidogrel (PLAVIX) 75 MG tablet Take 1 tablet (75 mg total) by mouth daily. 60 tablet 1   clopidogrel (PLAVIX) 75 MG tablet Take 1 tablet by mouth daily.     Cyanocobalamin (B-12) 1000 MCG SUBL Place 1 tablet under the tongue daily.     ferrous sulfate 324 (65 Fe) MG TBEC Take 1 tablet (325 mg total) by mouth every other day.     lidocaine-prilocaine (EMLA) cream Apply 1 application topically as needed (to port a cath 1 hour prior to each chemotherapy). 30 g 3   metoprolol tartrate (LOPRESSOR) 25 MG tablet Take 1 tablet (25 mg total) by mouth 2 (two) times daily. 60 tablet 1   pantoprazole (PROTONIX) 40 MG tablet Take 1 tablet (40 mg total) by mouth 2 (two) times daily before a meal. 180 tablet 1   tadalafil (CIALIS) 20 MG tablet TAKE ONE TABLET BY MOUTH DAILY AS NEEDED FOR ERECTILE DYSFUNCTION 20 tablet 3   ondansetron (ZOFRAN) 8 MG tablet Take 1 tablet (8 mg total) by mouth every 8 (eight) hours as needed for nausea or vomiting. (Patient not taking: Reported on 11/21/2020) 20 tablet 3   prochlorperazine (COMPAZINE) 10 MG tablet Take 1 tablet (10 mg total) by mouth every 6 (six) hours as needed for nausea or vomiting. (Patient not taking: Reported on 11/21/2020) 30 tablet 3   No current facility-administered medications for this visit.   Facility-Administered Medications Ordered in Other Visits  Medication Dose Route Frequency Provider Last Rate Last Admin   sodium chloride flush (NS) 0.9 % injection 10 mL  10 mL Intravenous PRN Cammie Sickle, MD   10 mL at 10/09/20 0855      .  PHYSICAL  EXAMINATION: ECOG PERFORMANCE STATUS: 0 - Asymptomatic  Vitals:   11/21/20 0855  BP: (!) 153/90  Pulse: 63  Resp: 18  Temp: (!) 96.5 F (35.8 C)  SpO2: 99%   Filed Weights   11/21/20 0854 11/21/20 0855  Weight: 166 lb (75.3 kg) 166 lb (75.3 kg)    Physical Exam Vitals and nursing note reviewed.  HENT:     Head:  Normocephalic and atraumatic.     Mouth/Throat:     Pharynx: Oropharynx is clear.  Eyes:     Extraocular Movements: Extraocular movements intact.     Pupils: Pupils are equal, round, and reactive to light.  Cardiovascular:     Rate and Rhythm: Normal rate and regular rhythm.  Pulmonary:     Comments: Decreased breath sounds bilaterally.  Abdominal:     Palpations: Abdomen is soft.  Musculoskeletal:        General: Normal range of motion.     Cervical back: Normal range of motion.  Skin:    General: Skin is warm.  Neurological:     General: No focal deficit present.     Mental Status: He is alert and oriented to person, place, and time.  Psychiatric:        Behavior: Behavior normal.        Judgment: Judgment normal.     LABORATORY DATA:  I have reviewed the data as listed Lab Results  Component Value Date   WBC 6.5 11/21/2020   HGB 11.8 (L) 11/21/2020   HCT 38.2 (L) 11/21/2020   MCV 88.2 11/21/2020   PLT 127 (L) 11/21/2020   Recent Labs    10/23/20 0909 11/07/20 0831 11/21/20 0837  NA 135 136 137  K 3.8 4.3 4.2  CL 105 104 107  CO2 24 26 25   GLUCOSE 97 103* 90  BUN 11 18 15   CREATININE 1.40* 1.41* 1.44*  CALCIUM 8.6* 9.1 8.5*  GFRNONAA 52* 51* 50*  PROT 6.4* 7.1 6.4*  ALBUMIN 3.5 3.9 3.7  AST 22 24 27   ALT 16 17 21   ALKPHOS 104 130* 126  BILITOT 0.2* 0.6 0.3    RADIOGRAPHIC STUDIES: I have personally reviewed the radiological images as listed and agreed with the findings in the report. No results found.  ASSESSMENT & PLAN:   No problem-specific Assessment & Plan notes found for this encounter.   All questions were answered.  The patient knows to call the clinic with any problems, questions or concerns.    Cammie Sickle, MD 11/21/2020 9:33 AM

## 2020-11-21 NOTE — Progress Notes (Signed)
Nutrition Follow-up:  Patient with synchronous colon.rectal cancer.  Patient receiving folfox.  Met with patient during infusion.  Patient reports that his appetite is good.  Eating 3 meals per day without difficulty.  Denies any nutrition impact symptoms at this time  Medications: reviewed  Labs: reviewed  Anthropometrics:   Weight 166 lb today increased  162 lb on 10/10 162 lb on 9/26  UBW of 172-173 lb   NUTRITION DIAGNOSIS: Inadequate oral intake improved   INTERVENTION:  Patient to continue eating well balanced diet including good sources of protein RD available if needed in the future    NEXT VISIT: no follow-up as patient eating well, gaining weight RD available if nutrition status changes  Shaylinn Hladik B. Zenia Resides, [place], [place] Registered Dietitian [phone] (mobile)

## 2020-11-21 NOTE — Progress Notes (Signed)
Patient states that he has no questions or concerns today

## 2020-11-21 NOTE — Patient Instructions (Addendum)
[facility]   Discharge Instructions: Thank you for choosing [facility] to provide your oncology and hematology care.  If you have a lab appointment with the [facility], please go directly to the [facility] and check in at the registration area.  Wear comfortable clothing and clothing appropriate for easy access to any Portacath or PICC line.   We strive to give you quality time with your provider. You may need to reschedule your appointment if you arrive late (15 or more minutes).  Arriving late affects you and other patients whose appointments are after yours.  Also, if you miss three or more appointments without notifying the office, you may be dismissed from the clinic at the provider's discretion.      For prescription refill requests, have your pharmacy contact our office and allow 72 hours for refills to be completed.    Today you received the following chemotherapy and/or immunotherapy agents: Mvasi, Oxaliplatin, and Fluorouracil.      To help prevent nausea and vomiting after your treatment, we encourage you to take your nausea medication as directed.  BELOW ARE SYMPTOMS THAT SHOULD BE REPORTED IMMEDIATELY: *FEVER GREATER THAN 100.4 F (38 C) OR HIGHER *CHILLS OR SWEATING *NAUSEA AND VOMITING THAT IS NOT CONTROLLED WITH YOUR NAUSEA MEDICATION *UNUSUAL SHORTNESS OF BREATH *UNUSUAL BRUISING OR BLEEDING *URINARY PROBLEMS (pain or burning when urinating, or frequent urination) *BOWEL PROBLEMS (unusual diarrhea, constipation, pain near the anus) TENDERNESS IN MOUTH AND THROAT WITH OR WITHOUT PRESENCE OF ULCERS (sore throat, sores in mouth, or a toothache) UNUSUAL RASH, SWELLING OR PAIN  UNUSUAL VAGINAL DISCHARGE OR ITCHING   Items with * indicate a potential emergency and should be followed up as soon as possible or go to the Emergency Department if any problems should occur.  Please show the CHEMOTHERAPY ALERT CARD or  IMMUNOTHERAPY ALERT CARD at check-in to the Emergency Department and triage nurse.  Should you have questions after your visit or need to cancel or reschedule your appointment, please contact [facility]  [phone] and follow the prompts.  Office hours are 8:00 a.m. to 4:30 p.m. Monday - Friday. Please note that voicemails left after 4:00 p.m. may not be returned until the following business day.  We are closed weekends and major holidays. You have access to a nurse at all times for urgent questions. Please call the main number to the clinic [phone] and follow the prompts.  For any non-urgent questions, you may also contact your provider using MyChart. We now offer e-Visits for anyone 34 and older to request care online for non-urgent symptoms. For details visit [email or web address].   Also download the MyChart app! Go to the app store, search "MyChart", open the app, select [facility], and log in with your MyChart username and password.  Due to Covid, a mask is required upon entering the hospital/clinic. If you do not have a mask, one will be given to you upon arrival. For doctor visits, patients may have 1 support person aged 32 or older with them. For treatment visits, patients cannot have anyone with them due to current Covid guidelines and our immunocompromised population.   The chemotherapy medication bag should finish at 46 hours. For example, if your pump is scheduled for 46 hours and it was put on at 4:00 p.m., it should finish at 2:00 p.m. the day it is scheduled to come off regardless of your appointment time.     Estimated  time to finish on 11/23/2020 at 11:40 am.   If the pump alarms go off prior to the pump reading "Low Volume" then call [phone] and someone can assist you.  If the plunger comes out and the chemotherapy medication is leaking out, please use your home chemo spill kit to clean up the spill. Do NOT use paper towels or  other household products.  If you have problems or questions regarding your pump, please call either [phone] (24 hours a day) or the cancer center Monday-Friday 8:00 a.m.- 4:30 p.m. at the clinic number and we will assist you. If you are unable to get assistance, then go to the nearest Emergency Department and ask the staff to contact the IV team for assistance.

## 2020-11-21 NOTE — Assessment & Plan Note (Signed)
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 PET scan: SEP 2022-proximal right colon [adjacent mesenteric lymph nodes] and rectal hypermetabolism + bilateral hypermetabolic lymphadenopathy noted in the retroperitoneal/left external iliac; inferior right lobe of the liver concerning for metastatic disease; right lower quadrant- peritoneal carcinomatosis; bilateral solid pulmonary nodules ~5 mm; MRI rectum- T stage: T4a; N stage:  N1. Currently on pallaitive chemo- FOLFOX; with cycle #3-add bev.   # proceed with FOLFOX #4 today; ADDED BEV [with cycle #3]. Labs today reviewed;  acceptable for treatment today. Will repeat imaging at 6 cycles.   # Anemia/iron deficiency hemoglobin ~12- STABLE. ; secondary to chronic GI bleeding/tumor.   # HTN- 150/90s; recommend checking BP at home/bring log since being on avastin.   # Chronic kidney disease stage III-[creat 1.4] STABLE.   # Vaccination: s/p Flu shot.  # DISPOSITION:  # FOLFOX + Bev today # Follow up in 2 weeks- MD: labs- cbc/cmp; CEA;  FOLFOX+ bev; Pump off in 2 days--Dr.B

## 2020-11-23 ENCOUNTER — Inpatient Hospital Stay: Payer: Medicare HMO

## 2020-11-23 ENCOUNTER — Other Ambulatory Visit: Payer: Self-pay

## 2020-11-23 DIAGNOSIS — C20 Malignant neoplasm of rectum: Secondary | ICD-10-CM

## 2020-11-23 DIAGNOSIS — N183 Chronic kidney disease, stage 3 unspecified: Secondary | ICD-10-CM | POA: Diagnosis not present

## 2020-11-23 DIAGNOSIS — D509 Iron deficiency anemia, unspecified: Secondary | ICD-10-CM | POA: Diagnosis not present

## 2020-11-23 DIAGNOSIS — Z5111 Encounter for antineoplastic chemotherapy: Secondary | ICD-10-CM | POA: Diagnosis not present

## 2020-11-23 DIAGNOSIS — I129 Hypertensive chronic kidney disease with stage 1 through stage 4 chronic kidney disease, or unspecified chronic kidney disease: Secondary | ICD-10-CM | POA: Diagnosis not present

## 2020-11-23 DIAGNOSIS — Z79899 Other long term (current) drug therapy: Secondary | ICD-10-CM | POA: Diagnosis not present

## 2020-11-23 MED ORDER — HEPARIN SOD (PORK) LOCK FLUSH 100 UNIT/ML IV SOLN
500.0000 [IU] | Freq: Once | INTRAVENOUS | Status: AC | PRN
  Filled 2020-11-23: qty 5

## 2020-11-23 MED ORDER — SODIUM CHLORIDE 0.9% FLUSH
10.0000 mL | INTRAVENOUS | Status: DC | PRN
  Administered 2020-11-23: 10 mL
  Filled 2020-11-23: qty 10

## 2020-11-23 MED ORDER — HEPARIN SOD (PORK) LOCK FLUSH 100 UNIT/ML IV SOLN
INTRAVENOUS | Status: AC
  Administered 2020-11-23: 500 [IU]
  Filled 2020-11-23: qty 5

## 2020-11-25 DIAGNOSIS — C20 Malignant neoplasm of rectum: Secondary | ICD-10-CM | POA: Diagnosis not present

## 2020-12-04 ENCOUNTER — Inpatient Hospital Stay: Payer: Medicare HMO

## 2020-12-04 ENCOUNTER — Inpatient Hospital Stay (HOSPITAL_BASED_OUTPATIENT_CLINIC_OR_DEPARTMENT_OTHER): Payer: Medicare HMO | Admitting: Internal Medicine

## 2020-12-04 ENCOUNTER — Encounter: Payer: Self-pay | Admitting: Internal Medicine

## 2020-12-04 ENCOUNTER — Other Ambulatory Visit: Payer: Self-pay

## 2020-12-04 DIAGNOSIS — C20 Malignant neoplasm of rectum: Secondary | ICD-10-CM | POA: Diagnosis not present

## 2020-12-04 DIAGNOSIS — N183 Chronic kidney disease, stage 3 unspecified: Secondary | ICD-10-CM | POA: Diagnosis not present

## 2020-12-04 DIAGNOSIS — D509 Iron deficiency anemia, unspecified: Secondary | ICD-10-CM | POA: Diagnosis not present

## 2020-12-04 DIAGNOSIS — Z5111 Encounter for antineoplastic chemotherapy: Secondary | ICD-10-CM | POA: Diagnosis not present

## 2020-12-04 DIAGNOSIS — I129 Hypertensive chronic kidney disease with stage 1 through stage 4 chronic kidney disease, or unspecified chronic kidney disease: Secondary | ICD-10-CM | POA: Diagnosis not present

## 2020-12-04 DIAGNOSIS — Z79899 Other long term (current) drug therapy: Secondary | ICD-10-CM | POA: Diagnosis not present

## 2020-12-04 LAB — CBC WITH DIFFERENTIAL/PLATELET
Abs Immature Granulocytes: 0.01 10*3/uL (ref 0.00–0.07)
Basophils Absolute: 0.1 10*3/uL (ref 0.0–0.1)
Basophils Relative: 1 %
Eosinophils Absolute: 0.3 10*3/uL (ref 0.0–0.5)
Eosinophils Relative: 4 %
HCT: 42 % (ref 39.0–52.0)
Hemoglobin: 13 g/dL (ref 13.0–17.0)
Immature Granulocytes: 0 %
Lymphocytes Relative: 18 %
Lymphs Abs: 1.3 10*3/uL (ref 0.7–4.0)
MCH: 27.8 pg (ref 26.0–34.0)
MCHC: 31 g/dL (ref 30.0–36.0)
MCV: 89.7 fL (ref 80.0–100.0)
Monocytes Absolute: 0.7 10*3/uL (ref 0.1–1.0)
Monocytes Relative: 10 %
Neutro Abs: 4.9 10*3/uL (ref 1.7–7.7)
Neutrophils Relative %: 67 %
Platelets: 130 10*3/uL — ABNORMAL LOW (ref 150–400)
RBC: 4.68 MIL/uL (ref 4.22–5.81)
RDW: 24.9 % — ABNORMAL HIGH (ref 11.5–15.5)
WBC: 7.2 10*3/uL (ref 4.0–10.5)
nRBC: 0 % (ref 0.0–0.2)

## 2020-12-04 LAB — URINALYSIS, COMPLETE (UACMP) WITH MICROSCOPIC
Bilirubin Urine: NEGATIVE
Glucose, UA: NEGATIVE mg/dL
Hgb urine dipstick: NEGATIVE
Ketones, ur: NEGATIVE mg/dL
Leukocytes,Ua: NEGATIVE
Nitrite: NEGATIVE
Protein, ur: NEGATIVE mg/dL
Specific Gravity, Urine: 1.025 (ref 1.005–1.030)
Squamous Epithelial / HPF: NONE SEEN (ref 0–5)
pH: 5 (ref 5.0–8.0)

## 2020-12-04 LAB — COMPREHENSIVE METABOLIC PANEL
ALT: 27 U/L (ref 0–44)
AST: 29 U/L (ref 15–41)
Albumin: 3.7 g/dL (ref 3.5–5.0)
Alkaline Phosphatase: 136 U/L — ABNORMAL HIGH (ref 38–126)
Anion gap: 5 (ref 5–15)
BUN: 17 mg/dL (ref 8–23)
CO2: 25 mmol/L (ref 22–32)
Calcium: 8.6 mg/dL — ABNORMAL LOW (ref 8.9–10.3)
Chloride: 105 mmol/L (ref 98–111)
Creatinine, Ser: 1.58 mg/dL — ABNORMAL HIGH (ref 0.61–1.24)
GFR, Estimated: 45 mL/min — ABNORMAL LOW (ref >60)
Glucose, Bld: 136 mg/dL — ABNORMAL HIGH (ref 70–99)
Potassium: 4.1 mmol/L (ref 3.5–5.1)
Sodium: 135 mmol/L (ref 135–145)
Total Bilirubin: 0.5 mg/dL (ref 0.3–1.2)
Total Protein: 6.7 g/dL (ref 6.5–8.1)

## 2020-12-04 MED ORDER — SODIUM CHLORIDE 0.9 % IV SOLN
INTRAVENOUS | Status: DC
  Filled 2020-12-04: qty 250

## 2020-12-04 MED ORDER — SODIUM CHLORIDE 0.9 % IV SOLN
2400.0000 mg/m2 | INTRAVENOUS | Status: DC
  Administered 2020-12-04: 4650 mg via INTRAVENOUS
  Filled 2020-12-04: qty 93

## 2020-12-04 MED ORDER — PALONOSETRON HCL INJECTION 0.25 MG/5ML
0.2500 mg | Freq: Once | INTRAVENOUS | Status: AC
  Administered 2020-12-04: 0.25 mg via INTRAVENOUS
  Filled 2020-12-04: qty 5

## 2020-12-04 MED ORDER — DEXTROSE 5 % IV SOLN
Freq: Once | INTRAVENOUS | Status: AC
  Filled 2020-12-04: qty 250

## 2020-12-04 MED ORDER — SODIUM CHLORIDE 0.9 % IV SOLN
700.0000 mg | INTRAVENOUS | Status: DC
  Administered 2020-12-04: 700 mg via INTRAVENOUS
  Filled 2020-12-04: qty 28

## 2020-12-04 MED ORDER — SODIUM CHLORIDE 0.9 % IV SOLN
10.0000 mg | Freq: Once | INTRAVENOUS | Status: AC
  Administered 2020-12-04: 10 mg via INTRAVENOUS
  Filled 2020-12-04: qty 10

## 2020-12-04 MED ORDER — SODIUM CHLORIDE 0.9% FLUSH
10.0000 mL | Freq: Once | INTRAVENOUS | Status: AC
  Administered 2020-12-04: 10 mL via INTRAVENOUS
  Filled 2020-12-04: qty 10

## 2020-12-04 MED ORDER — OXALIPLATIN CHEMO INJECTION 100 MG/20ML
85.0000 mg/m2 | Freq: Once | INTRAVENOUS | Status: AC
  Administered 2020-12-04: 165 mg via INTRAVENOUS
  Filled 2020-12-04: qty 33

## 2020-12-04 NOTE — Progress Notes (Signed)
[facility] test ordered via online [phone]).  Also faxed order requisition to [facility] pathology.

## 2020-12-04 NOTE — Progress Notes (Signed)
[facility] NOTE  Patient Care Team: [name], MD as PCP - General (Family Medicine) Clent Jacks, RN as Oncology Nurse Navigator  CHIEF COMPLAINTS/PURPOSE OF CONSULTATION: colon/rectal cancer  #  Oncology History Overview Note  # Malignant partially obstructing tumor in the transverse colon/70 cm proximal to the anus- C. COLON MASS, 70 CM; COLD BIOPSY:  - INTRAMUCOSAL ADENOCARCINOMA AT LEAST;  # One 20 mm polyp in the transverse colon, removed with mucosal resection. Resected and retrieved. Clips were placed. Tattooed.  # tumor in the mid rectum and at 10 cm proximal to the anus. Biopsied.      SEE COMMENT.   Comment:  There is no definitive evidence of invasion in this sample.  The  findings may not accurately represent the entire underlying lesion;  clinical correlation is recommended.   D. COLON POLYP, TRANSVERSE; HOT SNARE:  - TUBULOVILLOUS ADENOMA.  - NEGATIVE FOR HIGH GRADE DYSPLASIA AND MALIGNANCY.   E. RECTUM MASS; COLD BIOPSY:  - INVASIVE ADENOCARCINOMA, MODERATELY TO POORLY DIFFERENTIATED. ----------------------------------   # PET scan: SEP 2022-proximal right colon [adjacent mesenteric lymph nodes] and rectal hypermetabolism.  Additional subcentimeter bilateral hypermetabolic lymphadenopathy noted in the retroperitoneal/left external iliac; inferior right lobe of the liver concerning for metastatic disease; right lower quadrant soft tissue mass concerning for peritoneal carcinomatosis; bilateral solid pulmonary nodules ~5 mm; MRI rectum- T stage: T4a; N stage:  N1.   # 09/12-2020- FOLFOX chemo [Dr.White/Dr.Vanga]; ADDED BEV with cycle #3-DC 5-FU bolus+LV   Rectal cancer ([place])  09/11/2020 Initial Diagnosis   Rectal cancer ([place])   09/25/2020 -  Chemotherapy   Patient is on Treatment Plan : COLORECTAL FOLFOX q14d x 4 months     09/28/2020 Cancer Staging   Staging form: Colon and Rectum, AJCC 8th Edition - Clinical: Stage IVC  (cT4a, cN1, cM1c) - Signed by Cammie Sickle, MD on 09/28/2020     Genetic Testing   Negative genetic testing. No pathogenic variants identified on the [facility] Multi-Cancer+RNA Panel. The report date is 11/05/2020.  The Multi-Cancer Panel + RNA offered by [facility] includes sequencing and/or deletion duplication testing of the following 84 genes: AIP, ALK, APC, ATM, AXIN2,BAP1,  BARD1, BLM, BMPR1A, BRCA1, BRCA2, BRIP1, CASR, CDC73, CDH1, CDK4, CDKN1B, CDKN1C, CDKN2A (p14ARF), CDKN2A (p16INK4a), CEBPA, CHEK2, CTNNA1, DICER1, DIS3L2, EGFR (c.2369C>T, p.Thr790Met variant only), EPCAM (Deletion/duplication testing only), FH, FLCN, GATA2, GPC3, GREM1 (Promoter region deletion/duplication testing only), HOXB13 (c.251G>A, p.Gly84Glu), HRAS, KIT, MAX, MEN1, MET, MITF (c.952G>A, p.Glu318Lys variant only), MLH1, MSH2, MSH3, MSH6, MUTYH, NBN, NF1, NF2, NTHL1, PALB2, PDGFRA, PHOX2B, PMS2, POLD1, POLE, POT1, PRKAR1A, PTCH1, PTEN, RAD50, RAD51C, RAD51D, RB1, RECQL4, RET, RUNX1, SDHAF2, SDHA (sequence changes only), SDHB, SDHC, SDHD, SMAD4, SMARCA4, SMARCB1, SMARCE1, STK11, SUFU, TERC, TERT, TMEM127, TP53, TSC1, TSC2, VHL, WRN and WT1.      HISTORY OF PRESENTING ILLNESS: Ambulating independently.  Accompanied by his daughter.  Albert Giles 75 y.o.  male i synchronous colon cancer/rectal cancer-stage IV-on FOLFOX chemotherapy is here for follow-up.  Denies any nausea vomiting.  No fever no chills.  Denies any tingling or numbness.  Appetite good.  Review of Systems  Constitutional:  Negative for chills, diaphoresis, fever and weight loss.  HENT:  Negative for nosebleeds and sore throat.   Eyes:  Negative for double vision.  Respiratory:  Negative for cough, hemoptysis, sputum production, shortness of breath and wheezing.   Cardiovascular:  Negative for chest pain, palpitations, orthopnea and leg swelling.  Gastrointestinal:  Negative for abdominal  pain, blood in stool, constipation, diarrhea, heartburn,  melena, nausea and vomiting.  Genitourinary:  Negative for dysuria, frequency and urgency.  Skin: Negative.  Negative for itching and rash.  Neurological:  Negative for dizziness, tingling, focal weakness, weakness and headaches.  Endo/Heme/Allergies:  Does not bruise/bleed easily.  Psychiatric/Behavioral:  Negative for depression. The patient is not nervous/anxious and does not have insomnia.     MEDICAL HISTORY:  Past Medical History:  Diagnosis Date   CAD (coronary artery disease) 06/2014   UA with NSTEMI - cath with 99% prox L circ s/p stent, EF 40% ([place], [place] [facility])   Emphysema    mild   Ex-smoker    Family history of breast cancer    Family history of colon cancer    Family history of pancreatic cancer    NSTEMI (non-ST elevated myocardial infarction) ([place]) 07/13/2014    SURGICAL HISTORY: Past Surgical History:  Procedure Laterality Date   CARDIAC CATHETERIZATION N/A 07/14/2014   Left Heart Cath and Coronary Angiography with stent placement;  Surgeon: Teodoro Spray, MD   CARDIAC CATHETERIZATION N/A 07/14/2014   Coronary Stent Intervention;  Surgeon: Yolonda Kida, MD   COLONOSCOPY WITH PROPOFOL N/A 09/06/2020   Procedure: COLONOSCOPY WITH PROPOFOL;  Surgeon: Lin Landsman, MD;  Location: [facility] ENDOSCOPY;  Service: Gastroenterology;  Laterality: N/A;   CYSTECTOMY     on back   ESOPHAGOGASTRODUODENOSCOPY N/A 09/06/2020   Procedure: ESOPHAGOGASTRODUODENOSCOPY (EGD);  Surgeon: Lin Landsman, MD;  Location: [facility] ENDOSCOPY;  Service: Gastroenterology;  Laterality: N/A;   INGUINAL HERNIA REPAIR Right 08/17/04   IR IMAGING GUIDED PORT INSERTION  09/13/2020    SOCIAL HISTORY: Social History   Socioeconomic History   Marital status: Married    Spouse name: Not on file   Number of children: Not on file   Years of education: Not on file   Highest education level: Not on file  Occupational History   Not on file  Tobacco Use   Smoking status: Former     Packs/day: 1.00    Years: 35.00    Pack years: 35.00    Types: Cigarettes    Quit date: 07/07/1998    Years since quitting: 22.4   Smokeless tobacco: Never  Vaping Use   Vaping Use: Never used  Substance and Sexual Activity   Alcohol use: No   Drug use: No   Sexual activity: Not on file  Other Topics Concern   Not on file  Social History Narrative   Caffeine: 2 cups coffee, 2 cups tea/day   Lives with wife   Occupation: Higher education careers adviser, retired   Edu: HS   Activity: works in yard, walking   Diet: some water, fruits/vegetables daily   -------------------------------------------------------------------       [employer] [place]; [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    Social Determinants of Health   Financial Resource Strain: Low Risk    Difficulty of Paying Living Expenses: Not hard at all  Food Insecurity: No Food Insecurity   Worried About Charity fundraiser in the Last Year: Never true   [place] in the Last Year: Never true  Transportation Needs: No Transportation Needs   Lack of Transportation (Medical): No   Lack of Transportation (Non-Medical): No  Physical Activity: Inactive   Days of Exercise per Week: 0 days   Minutes of Exercise per Session: 0 min  Stress: No Stress Concern  Present   Feeling of Stress : Not at all  Social Connections: Not on file  Intimate Partner Violence: Not At Risk   Fear of Current or Ex-Partner: No   Emotionally Abused: No   Physically Abused: No   Sexually Abused: No    FAMILY HISTORY: Family History  Problem Relation Age of Onset   Cancer Mother 41       colon   Cancer Sister        breast   Crohn's disease Sister    Cancer Daughter        anal   Crohn's disease Niece    CAD Neg Hx    Stroke Neg Hx    Diabetes Neg Hx    Prostate cancer Neg Hx    Kidney cancer Neg Hx    Bladder Cancer Neg Hx     ALLERGIES:  has No Known Allergies.  MEDICATIONS:   Current Outpatient Medications  Medication Sig Dispense Refill   aspirin EC 81 MG tablet Take 1 tablet (81 mg total) by mouth daily. 60 tablet 1   atorvastatin (LIPITOR) 40 MG tablet Take 1 tablet (40 mg total) by mouth daily at 6 PM. 60 tablet 1   Cholecalciferol (VITAMIN D3) 25 MCG (1000 UT) CAPS Take 1 capsule (1,000 Units total) by mouth daily. 30 capsule    clopidogrel (PLAVIX) 75 MG tablet Take 1 tablet (75 mg total) by mouth daily. 60 tablet 1   Cyanocobalamin (B-12) 1000 MCG SUBL Place 1 tablet under the tongue daily.     ferrous sulfate 324 (65 Fe) MG TBEC Take 1 tablet (325 mg total) by mouth every other day.     lidocaine-prilocaine (EMLA) cream Apply 1 application topically as needed (to port a cath 1 hour prior to each chemotherapy). 30 g 3   metoprolol tartrate (LOPRESSOR) 25 MG tablet Take 1 tablet (25 mg total) by mouth 2 (two) times daily. 60 tablet 1   pantoprazole (PROTONIX) 40 MG tablet Take 1 tablet (40 mg total) by mouth 2 (two) times daily before a meal. 180 tablet 1   tadalafil (CIALIS) 20 MG tablet TAKE ONE TABLET BY MOUTH DAILY AS NEEDED FOR ERECTILE DYSFUNCTION 20 tablet 3   ondansetron (ZOFRAN) 8 MG tablet Take 1 tablet (8 mg total) by mouth every 8 (eight) hours as needed for nausea or vomiting. (Patient not taking: Reported on 11/21/2020) 20 tablet 3   prochlorperazine (COMPAZINE) 10 MG tablet Take 1 tablet (10 mg total) by mouth every 6 (six) hours as needed for nausea or vomiting. (Patient not taking: Reported on 11/21/2020) 30 tablet 3   No current facility-administered medications for this visit.   Facility-Administered Medications Ordered in Other Visits  Medication Dose Route Frequency Provider Last Rate Last Admin   sodium chloride flush (NS) 0.9 % injection 10 mL  10 mL Intravenous PRN Cammie Sickle, MD   10 mL at 10/09/20 0855      .  PHYSICAL EXAMINATION: ECOG PERFORMANCE STATUS: 0 - Asymptomatic  Vitals:   12/04/20 0917  BP: (!) 149/93   Pulse: 67  Resp: 18  Temp: (!) 97.3 F (36.3 C)   Filed Weights   12/04/20 0917  Weight: 155 lb 3.2 oz (70.4 kg)    Physical Exam Vitals and nursing note reviewed.  HENT:     Head: Normocephalic and atraumatic.     Mouth/Throat:     Pharynx: Oropharynx is clear.  Eyes:     Extraocular Movements: Extraocular movements  intact.     Pupils: Pupils are equal, round, and reactive to light.  Cardiovascular:     Rate and Rhythm: Normal rate and regular rhythm.  Pulmonary:     Comments: Decreased breath sounds bilaterally.  Abdominal:     Palpations: Abdomen is soft.  Musculoskeletal:        General: Normal range of motion.     Cervical back: Normal range of motion.  Skin:    General: Skin is warm.  Neurological:     General: No focal deficit present.     Mental Status: He is alert and oriented to person, place, and time.  Psychiatric:        Behavior: Behavior normal.        Judgment: Judgment normal.     LABORATORY DATA:  I have reviewed the data as listed Lab Results  Component Value Date   WBC 7.2 12/04/2020   HGB 13.0 12/04/2020   HCT 42.0 12/04/2020   MCV 89.7 12/04/2020   PLT 130 (L) 12/04/2020   Recent Labs    10/23/20 0909 11/07/20 0831 11/21/20 0837  NA 135 136 137  K 3.8 4.3 4.2  CL 105 104 107  CO2 24 26 25   GLUCOSE 97 103* 90  BUN 11 18 15   CREATININE 1.40* 1.41* 1.44*  CALCIUM 8.6* 9.1 8.5*  GFRNONAA 52* 51* 50*  PROT 6.4* 7.1 6.4*  ALBUMIN 3.5 3.9 3.7  AST 22 24 27   ALT 16 17 21   ALKPHOS 104 130* 126  BILITOT 0.2* 0.6 0.3    RADIOGRAPHIC STUDIES: I have personally reviewed the radiological images as listed and agreed with the findings in the report. No results found.  ASSESSMENT & PLAN:   Rectal cancer ([place])  # STAGE IV- Synchronous primaries a] rectal cancer-10cm-adenocarcinoma & b] transverse colon-- intramucosal adenoca].PET scan: SEP 2022-proximal right colon [adjacent mesenteric lymph nodes] and rectal hypermetabolism +  bilateral hypermetabolic lymphadenopathy noted in the retroperitoneal/left external iliac; inferior right lobe of the liver concerning for metastatic disease; right lower quadrant- peritoneal carcinomatosis; bilateral solid pulmonary nodules ~5 mm; MRI rectum- T stage: T4a; N stage:  N1. Currently on pallaitive chemo- FOLFOX; with cycle #3-add bev. Check NGS.   # proceed with FOLFOX #5 today; ADDED BEV [with cycle #3]. Labs today reviewed;  acceptable for treatment today. Will repeat imaging at this cycle [dec 14th].   # Anemia/iron deficiency hemoglobin ~12- STABLE. ; secondary to chronic GI bleeding/tumor.   # HTN- 150/90s; recommend checking BP at home/bring log since being on avastin.   # Chronic kidney disease stage III-[creat 1.4] STABLE.   # Vaccination: s/p Flu shot.  # DISPOSITION:  # FOLFOX + Bev today # Follow up in 2 weeks- MD: labs- cbc/cmp; CEA;  FOLFOX+ bev; Pump off in 2 days; CT CAP--Dr.B   All questions were answered. The patient knows to call the clinic with any problems, questions or concerns.    Cammie Sickle, MD 12/04/2020 10:28 AM

## 2020-12-04 NOTE — Assessment & Plan Note (Addendum)
#   STAGE IV- Synchronous primaries a] rectal cancer-10cm-adenocarcinoma & b] transverse colon-- intramucosal adenoca].PET scan: SEP 2022-proximal right colon [adjacent mesenteric lymph nodes] and rectal hypermetabolism + bilateral hypermetabolic lymphadenopathy noted in the retroperitoneal/left external iliac; inferior right lobe of the liver concerning for metastatic disease; right lower quadrant- peritoneal carcinomatosis; bilateral solid pulmonary nodules ~5 mm; MRI rectum- T stage: T4a; N stage:  N1. Currently on pallaitive chemo- FOLFOX; with cycle #3-add bev. Check NGS.   # proceed with FOLFOX #5 today; ADDED BEV [with cycle #3]. Labs today reviewed;  acceptable for treatment today. Will repeat imaging at this cycle [dec 14th].   # Anemia/iron deficiency hemoglobin ~12- STABLE. ; secondary to chronic GI bleeding/tumor.   # HTN- 150/90s; recommend checking BP at home/bring log since being on avastin.   # Chronic kidney disease stage III-[creat 1.4] STABLE.   # Vaccination: s/p Flu shot.  # DISPOSITION:  # FOLFOX + Bev today # Follow up in 2 weeks- MD: labs- cbc/cmp; CEA;  FOLFOX+ bev; Pump off in 2 days; CT CAP--Dr.B

## 2020-12-04 NOTE — Progress Notes (Signed)
Pt here for follow up. No new concerns voiced.   

## 2020-12-04 NOTE — Progress Notes (Signed)
Per MD ok to adjust avastin dose to 700mg  for weight loss.

## 2020-12-04 NOTE — Progress Notes (Signed)
Ok to tx per Dr B with creat 1.58.

## 2020-12-04 NOTE — Patient Instructions (Signed)
[facility]  Discharge Instructions: Thank you for choosing [facility] to provide your oncology and hematology care.  If you have a lab appointment with the [facility], please go directly to the [facility] and check in at the registration area.  Wear comfortable clothing and clothing appropriate for easy access to any Portacath or PICC line.   We strive to give you quality time with your provider. You may need to reschedule your appointment if you arrive late (15 or more minutes).  Arriving late affects you and other patients whose appointments are after yours.  Also, if you miss three or more appointments without notifying the office, you may be dismissed from the clinic at the provider's discretion.      For prescription refill requests, have your pharmacy contact our office and allow 72 hours for refills to be completed.    Today you received the following chemotherapy and/or immunotherapy agents : Avastin / Folfox     To help prevent nausea and vomiting after your treatment, we encourage you to take your nausea medication as directed.  BELOW ARE SYMPTOMS THAT SHOULD BE REPORTED IMMEDIATELY: *FEVER GREATER THAN 100.4 F (38 C) OR HIGHER *CHILLS OR SWEATING *NAUSEA AND VOMITING THAT IS NOT CONTROLLED WITH YOUR NAUSEA MEDICATION *UNUSUAL SHORTNESS OF BREATH *UNUSUAL BRUISING OR BLEEDING *URINARY PROBLEMS (pain or burning when urinating, or frequent urination) *BOWEL PROBLEMS (unusual diarrhea, constipation, pain near the anus) TENDERNESS IN MOUTH AND THROAT WITH OR WITHOUT PRESENCE OF ULCERS (sore throat, sores in mouth, or a toothache) UNUSUAL RASH, SWELLING OR PAIN  UNUSUAL VAGINAL DISCHARGE OR ITCHING   Items with * indicate a potential emergency and should be followed up as soon as possible or go to the Emergency Department if any problems should occur.  Please show the CHEMOTHERAPY ALERT CARD or IMMUNOTHERAPY ALERT CARD at  check-in to the Emergency Department and triage nurse.  Should you have questions after your visit or need to cancel or reschedule your appointment, please contact [facility]  [phone] and follow the prompts.  Office hours are 8:00 a.m. to 4:30 p.m. Monday - Friday. Please note that voicemails left after 4:00 p.m. may not be returned until the following business day.  We are closed weekends and major holidays. You have access to a nurse at all times for urgent questions. Please call the main number to the clinic [phone] and follow the prompts.  For any non-urgent questions, you may also contact your provider using MyChart. We now offer e-Visits for anyone 20 and older to request care online for non-urgent symptoms. For details visit [email or web address].   Also download the MyChart app! Go to the app store, search "MyChart", open the app, select [facility], and log in with your MyChart username and password.  Due to Covid, a mask is required upon entering the hospital/clinic. If you do not have a mask, one will be given to you upon arrival. For doctor visits, patients may have 1 support person aged 60 or older with them. For treatment visits, patients cannot have anyone with them due to current Covid guidelines and our immunocompromised population.

## 2020-12-05 ENCOUNTER — Ambulatory Visit: Payer: Medicare HMO

## 2020-12-05 ENCOUNTER — Ambulatory Visit: Payer: Medicare HMO | Admitting: Internal Medicine

## 2020-12-05 ENCOUNTER — Other Ambulatory Visit: Payer: Medicare HMO

## 2020-12-05 LAB — CEA: CEA: 4.6 ng/mL (ref 0.0–4.7)

## 2020-12-06 ENCOUNTER — Inpatient Hospital Stay: Payer: Medicare HMO

## 2020-12-06 ENCOUNTER — Other Ambulatory Visit: Payer: Self-pay

## 2020-12-06 DIAGNOSIS — C20 Malignant neoplasm of rectum: Secondary | ICD-10-CM

## 2020-12-06 MED ORDER — SODIUM CHLORIDE 0.9% FLUSH
10.0000 mL | INTRAVENOUS | Status: DC | PRN
  Filled 2020-12-06: qty 10

## 2020-12-06 MED ORDER — HEPARIN SOD (PORK) LOCK FLUSH 100 UNIT/ML IV SOLN
500.0000 [IU] | Freq: Once | INTRAVENOUS | Status: DC | PRN
  Filled 2020-12-06: qty 5

## 2020-12-15 NOTE — Progress Notes (Signed)
Received notification via fax that testing is in process; however a portion of the test needs to be repeated.  There will be a 7-10 day delay.

## 2020-12-19 ENCOUNTER — Inpatient Hospital Stay: Payer: Medicare HMO

## 2020-12-19 ENCOUNTER — Encounter: Payer: Self-pay | Admitting: Internal Medicine

## 2020-12-19 ENCOUNTER — Inpatient Hospital Stay (HOSPITAL_BASED_OUTPATIENT_CLINIC_OR_DEPARTMENT_OTHER): Payer: Medicare HMO | Admitting: Internal Medicine

## 2020-12-19 ENCOUNTER — Inpatient Hospital Stay: Payer: Medicare HMO | Attending: Internal Medicine

## 2020-12-19 ENCOUNTER — Other Ambulatory Visit: Payer: Self-pay

## 2020-12-19 DIAGNOSIS — C20 Malignant neoplasm of rectum: Secondary | ICD-10-CM | POA: Insufficient documentation

## 2020-12-19 DIAGNOSIS — Z79899 Other long term (current) drug therapy: Secondary | ICD-10-CM | POA: Diagnosis not present

## 2020-12-19 DIAGNOSIS — N183 Chronic kidney disease, stage 3 unspecified: Secondary | ICD-10-CM | POA: Diagnosis not present

## 2020-12-19 DIAGNOSIS — Z95828 Presence of other vascular implants and grafts: Secondary | ICD-10-CM

## 2020-12-19 DIAGNOSIS — D696 Thrombocytopenia, unspecified: Secondary | ICD-10-CM | POA: Diagnosis not present

## 2020-12-19 DIAGNOSIS — D509 Iron deficiency anemia, unspecified: Secondary | ICD-10-CM | POA: Insufficient documentation

## 2020-12-19 DIAGNOSIS — Z5111 Encounter for antineoplastic chemotherapy: Secondary | ICD-10-CM | POA: Diagnosis present

## 2020-12-19 DIAGNOSIS — I129 Hypertensive chronic kidney disease with stage 1 through stage 4 chronic kidney disease, or unspecified chronic kidney disease: Secondary | ICD-10-CM | POA: Diagnosis not present

## 2020-12-19 LAB — CBC WITH DIFFERENTIAL/PLATELET
Abs Immature Granulocytes: 0.01 10*3/uL (ref 0.00–0.07)
Basophils Absolute: 0.1 10*3/uL (ref 0.0–0.1)
Basophils Relative: 2 %
Eosinophils Absolute: 0.5 10*3/uL (ref 0.0–0.5)
Eosinophils Relative: 8 %
HCT: 41.1 % (ref 39.0–52.0)
Hemoglobin: 12.7 g/dL — ABNORMAL LOW (ref 13.0–17.0)
Immature Granulocytes: 0 %
Lymphocytes Relative: 26 %
Lymphs Abs: 1.4 10*3/uL (ref 0.7–4.0)
MCH: 28.5 pg (ref 26.0–34.0)
MCHC: 30.9 g/dL (ref 30.0–36.0)
MCV: 92.4 fL (ref 80.0–100.0)
Monocytes Absolute: 0.9 10*3/uL (ref 0.1–1.0)
Monocytes Relative: 16 %
Neutro Abs: 2.6 10*3/uL (ref 1.7–7.7)
Neutrophils Relative %: 48 %
Platelets: 88 10*3/uL — ABNORMAL LOW (ref 150–400)
RBC: 4.45 MIL/uL (ref 4.22–5.81)
RDW: 22.9 % — ABNORMAL HIGH (ref 11.5–15.5)
WBC: 5.4 10*3/uL (ref 4.0–10.5)
nRBC: 0 % (ref 0.0–0.2)

## 2020-12-19 LAB — COMPREHENSIVE METABOLIC PANEL
ALT: 33 U/L (ref 0–44)
AST: 40 U/L (ref 15–41)
Albumin: 3.5 g/dL (ref 3.5–5.0)
Alkaline Phosphatase: 142 U/L — ABNORMAL HIGH (ref 38–126)
Anion gap: 8 (ref 5–15)
BUN: 12 mg/dL (ref 8–23)
CO2: 26 mmol/L (ref 22–32)
Calcium: 8.7 mg/dL — ABNORMAL LOW (ref 8.9–10.3)
Chloride: 104 mmol/L (ref 98–111)
Creatinine, Ser: 1.35 mg/dL — ABNORMAL HIGH (ref 0.61–1.24)
GFR, Estimated: 54 mL/min — ABNORMAL LOW (ref >60)
Glucose, Bld: 99 mg/dL (ref 70–99)
Potassium: 4.2 mmol/L (ref 3.5–5.1)
Sodium: 138 mmol/L (ref 135–145)
Total Bilirubin: 0.2 mg/dL — ABNORMAL LOW (ref 0.3–1.2)
Total Protein: 6.5 g/dL (ref 6.5–8.1)

## 2020-12-19 LAB — URINALYSIS, COMPLETE (UACMP) WITH MICROSCOPIC
Bacteria, UA: NONE SEEN
Bilirubin Urine: NEGATIVE
Glucose, UA: NEGATIVE mg/dL
Hgb urine dipstick: NEGATIVE
Ketones, ur: NEGATIVE mg/dL
Leukocytes,Ua: NEGATIVE
Nitrite: NEGATIVE
Protein, ur: NEGATIVE mg/dL
Specific Gravity, Urine: 1.016 (ref 1.005–1.030)
Squamous Epithelial / HPF: NONE SEEN (ref 0–5)
pH: 5 (ref 5.0–8.0)

## 2020-12-19 MED ORDER — DEXTROSE 5 % IV SOLN
Freq: Once | INTRAVENOUS | Status: AC
  Filled 2020-12-19: qty 250

## 2020-12-19 MED ORDER — SODIUM CHLORIDE 0.9% FLUSH
10.0000 mL | INTRAVENOUS | Status: DC | PRN
  Filled 2020-12-19: qty 10

## 2020-12-19 MED ORDER — SODIUM CHLORIDE 0.9% FLUSH
10.0000 mL | Freq: Once | INTRAVENOUS | Status: AC
  Administered 2020-12-19: 10 mL via INTRAVENOUS
  Filled 2020-12-19: qty 10

## 2020-12-19 MED ORDER — PALONOSETRON HCL INJECTION 0.25 MG/5ML
0.2500 mg | Freq: Once | INTRAVENOUS | Status: AC
  Administered 2020-12-19: 0.25 mg via INTRAVENOUS
  Filled 2020-12-19: qty 5

## 2020-12-19 MED ORDER — OXALIPLATIN CHEMO INJECTION 100 MG/20ML
70.0000 mg/m2 | Freq: Once | INTRAVENOUS | Status: AC
  Administered 2020-12-19: 135 mg via INTRAVENOUS
  Filled 2020-12-19: qty 20

## 2020-12-19 MED ORDER — SODIUM CHLORIDE 0.9 % IV SOLN
10.0000 mg | Freq: Once | INTRAVENOUS | Status: AC
  Administered 2020-12-19: 10 mg via INTRAVENOUS
  Filled 2020-12-19: qty 10

## 2020-12-19 MED ORDER — SODIUM CHLORIDE 0.9 % IV SOLN
2400.0000 mg/m2 | INTRAVENOUS | Status: DC
  Administered 2020-12-19: 4650 mg via INTRAVENOUS
  Filled 2020-12-19: qty 93

## 2020-12-19 NOTE — Patient Instructions (Addendum)
[facility] AT [place]  Discharge Instructions: Thank you for choosing [facility] to provide your oncology and hematology care.  If you have a lab appointment with the [facility], please go directly to the [facility] and check in at the registration area.  Wear comfortable clothing and clothing appropriate for easy access to any Portacath or PICC line.   We strive to give you quality time with your provider. You may need to reschedule your appointment if you arrive late (15 or more minutes).  Arriving late affects you and other patients whose appointments are after yours.  Also, if you miss three or more appointments without notifying the office, you may be dismissed from the clinic at the provider's discretion.      For prescription refill requests, have your pharmacy contact our office and allow 72 hours for refills to be completed.    Today you received the following chemotherapy and/or immunotherapy agents - oxaliplatin, fluorouracil      To help prevent nausea and vomiting after your treatment, we encourage you to take your nausea medication as directed.  BELOW ARE SYMPTOMS THAT SHOULD BE REPORTED IMMEDIATELY: *FEVER GREATER THAN 100.4 F (38 C) OR HIGHER *CHILLS OR SWEATING *NAUSEA AND VOMITING THAT IS NOT CONTROLLED WITH YOUR NAUSEA MEDICATION *UNUSUAL SHORTNESS OF BREATH *UNUSUAL BRUISING OR BLEEDING *URINARY PROBLEMS (pain or burning when urinating, or frequent urination) *BOWEL PROBLEMS (unusual diarrhea, constipation, pain near the anus) TENDERNESS IN MOUTH AND THROAT WITH OR WITHOUT PRESENCE OF ULCERS (sore throat, sores in mouth, or a toothache) UNUSUAL RASH, SWELLING OR PAIN  UNUSUAL VAGINAL DISCHARGE OR ITCHING   Items with * indicate a potential emergency and should be followed up as soon as possible or go to the Emergency Department if any problems should occur.  Please show the CHEMOTHERAPY ALERT CARD or IMMUNOTHERAPY ALERT  CARD at check-in to the Emergency Department and triage nurse.  Should you have questions after your visit or need to cancel or reschedule your appointment, please contact [facility] [place] [facility] [place]  [phone] and follow the prompts.  Office hours are 8:00 a.m. to 4:30 p.m. Monday - Friday. Please note that voicemails left after 4:00 p.m. may not be returned until the following business day.  We are closed weekends and major holidays. You have access to a nurse at all times for urgent questions. Please call the main number to the clinic [phone] and follow the prompts.  For any non-urgent questions, you may also contact your provider using MyChart. We now offer e-Visits for anyone 62 and older to request care online for non-urgent symptoms. For details visit [email or web address].   Also download the MyChart app! Go to the app store, search "MyChart", open the app, select [facility], and log in with your MyChart username and password.  Due to Covid, a mask is required upon entering the hospital/clinic. If you do not have a mask, one will be given to you upon arrival. For doctor visits, patients may have 1 support person aged 56 or older with them. For treatment visits, patients cannot have anyone with them due to current Covid guidelines and our immunocompromised population.   Oxaliplatin Injection What is this medication? OXALIPLATIN (ox AL i PLA tin) is a chemotherapy drug. It targets fast dividing cells, like cancer cells, and causes these cells to die. This medicine is used to treat cancers of the colon and rectum, and many other cancers. This medicine may be used for other  purposes; ask your health care provider or pharmacist if you have questions. COMMON BRAND NAME(S): Eloxatin What should I tell my care team before I take this medication? They need to know if you have any of these conditions: heart disease history of irregular heartbeat liver disease low blood  counts, like white cells, platelets, or red blood cells lung or breathing disease, like asthma take medicines that treat or prevent blood clots tingling of the fingers or toes, or other nerve disorder an unusual or allergic reaction to oxaliplatin, other chemotherapy, other medicines, foods, dyes, or preservatives pregnant or trying to get pregnant breast-feeding How should I use this medication? This drug is given as an infusion into a vein. It is administered in a hospital or clinic by a specially trained health care professional. Talk to your pediatrician regarding the use of this medicine in children. Special care may be needed. Overdosage: If you think you have taken too much of this medicine contact a poison control center or emergency room at once. NOTE: This medicine is only for you. Do not share this medicine with others. What if I miss a dose? It is important not to miss a dose. Call your doctor or health care professional if you are unable to keep an appointment. What may interact with this medication? Do not take this medicine with any of the following medications: cisapride dronedarone pimozide thioridazine This medicine may also interact with the following medications: aspirin and aspirin-like medicines certain medicines that treat or prevent blood clots like warfarin, apixaban, dabigatran, and rivaroxaban cisplatin cyclosporine diuretics medicines for infection like acyclovir, adefovir, amphotericin B, bacitracin, cidofovir, foscarnet, ganciclovir, gentamicin, pentamidine, vancomycin NSAIDs, medicines for pain and inflammation, like ibuprofen or naproxen other medicines that prolong the QT interval (an abnormal heart rhythm) pamidronate zoledronic acid This list may not describe all possible interactions. Give your health care provider a list of all the medicines, herbs, non-prescription drugs, or dietary supplements you use. Also tell them if you smoke, drink alcohol,  or use illegal drugs. Some items may interact with your medicine. What should I watch for while using this medication? Your condition will be monitored carefully while you are receiving this medicine. You may need blood work done while you are taking this medicine. This medicine may make you feel generally unwell. This is not uncommon as chemotherapy can affect healthy cells as well as cancer cells. Report any side effects. Continue your course of treatment even though you feel ill unless your healthcare professional tells you to stop. This medicine can make you more sensitive to cold. Do not drink cold drinks or use ice. Cover exposed skin before coming in contact with cold temperatures or cold objects. When out in cold weather wear warm clothing and cover your mouth and nose to warm the air that goes into your lungs. Tell your doctor if you get sensitive to the cold. Do not become pregnant while taking this medicine or for 9 months after stopping it. Women should inform their health care professional if they wish to become pregnant or think they might be pregnant. Men should not father a child while taking this medicine and for 6 months after stopping it. There is potential for serious side effects to an unborn child. Talk to your health care professional for more information. Do not breast-feed a child while taking this medicine or for 3 months after stopping it. This medicine has caused ovarian failure in some women. This medicine may make it more difficult to  get pregnant. Talk to your health care professional if you are concerned about your fertility. This medicine has caused decreased sperm counts in some men. This may make it more difficult to father a child. Talk to your health care professional if you are concerned about your fertility. This medicine may increase your risk of getting an infection. Call your health care professional for advice if you get a fever, chills, or sore throat, or other  symptoms of a cold or flu. Do not treat yourself. Try to avoid being around people who are sick. Avoid taking medicines that contain aspirin, acetaminophen, ibuprofen, naproxen, or ketoprofen unless instructed by your health care professional. These medicines may hide a fever. Be careful brushing or flossing your teeth or using a toothpick because you may get an infection or bleed more easily. If you have any dental work done, tell your dentist you are receiving this medicine. What side effects may I notice from receiving this medication? Side effects that you should report to your doctor or health care professional as soon as possible: allergic reactions like skin rash, itching or hives, swelling of the face, lips, or tongue breathing problems cough low blood counts - this medicine may decrease the number of white blood cells, red blood cells, and platelets. You may be at increased risk for infections and bleeding nausea, vomiting pain, redness, or irritation at site where injected pain, tingling, numbness in the hands or feet signs and symptoms of bleeding such as bloody or black, tarry stools; red or dark brown urine; spitting up blood or brown material that looks like coffee grounds; red spots on the skin; unusual bruising or bleeding from the eyes, gums, or nose signs and symptoms of a dangerous change in heartbeat or heart rhythm like chest pain; dizziness; fast, irregular heartbeat; palpitations; feeling faint or lightheaded; falls signs and symptoms of infection like fever; chills; cough; sore throat; pain or trouble passing urine signs and symptoms of liver injury like dark yellow or brown urine; general ill feeling or flu-like symptoms; light-colored stools; loss of appetite; nausea; right upper belly pain; unusually weak or tired; yellowing of the eyes or skin signs and symptoms of low red blood cells or anemia such as unusually weak or tired; feeling faint or lightheaded; falls signs and  symptoms of muscle injury like dark urine; trouble passing urine or change in the amount of urine; unusually weak or tired; muscle pain; back pain Side effects that usually do not require medical attention (report to your doctor or health care professional if they continue or are bothersome): changes in taste diarrhea gas hair loss loss of appetite mouth sores This list may not describe all possible side effects. Call your doctor for medical advice about side effects. You may report side effects to FDA at 1-800-FDA-1088. Where should I keep my medication? This drug is given in a hospital or clinic and will not be stored at home. NOTE: This sheet is a summary. It may not cover all possible information. If you have questions about this medicine, talk to your doctor, pharmacist, or health care provider.  2022 [employer] Standard (2020-09-19 00:00:00)  Fluorouracil, 5-FU injection What is this medication? FLUOROURACIL, 5-FU (flure oh YOOR a sil) is a chemotherapy drug. It slows the growth of cancer cells. This medicine is used to treat many types of cancer like breast cancer, colon or rectal cancer, pancreatic cancer, and stomach cancer. This medicine may be used for other purposes; ask your health care provider or  pharmacist if you have questions. COMMON BRAND NAME(S): Adrucil What should I tell my care team before I take this medication? They need to know if you have any of these conditions: blood disorders dihydropyrimidine dehydrogenase (DPD) deficiency infection (especially a virus infection such as chickenpox, cold sores, or herpes) kidney disease liver disease malnourished, poor nutrition recent or ongoing radiation therapy an unusual or allergic reaction to fluorouracil, other chemotherapy, other medicines, foods, dyes, or preservatives pregnant or trying to get pregnant breast-feeding How should I use this medication? This drug is given as an infusion or injection into a  vein. It is administered in a hospital or clinic by a specially trained health care professional. Talk to your pediatrician regarding the use of this medicine in children. Special care may be needed. Overdosage: If you think you have taken too much of this medicine contact a poison control center or emergency room at once. NOTE: This medicine is only for you. Do not share this medicine with others. What if I miss a dose? It is important not to miss your dose. Call your doctor or health care professional if you are unable to keep an appointment. What may interact with this medication? Do not take this medicine with any of the following medications: live virus vaccines This medicine may also interact with the following medications: medicines that treat or prevent blood clots like warfarin, enoxaparin, and dalteparin This list may not describe all possible interactions. Give your health care provider a list of all the medicines, herbs, non-prescription drugs, or dietary supplements you use. Also tell them if you smoke, drink alcohol, or use illegal drugs. Some items may interact with your medicine. What should I watch for while using this medication? Visit your doctor for checks on your progress. This drug may make you feel generally unwell. This is not uncommon, as chemotherapy can affect healthy cells as well as cancer cells. Report any side effects. Continue your course of treatment even though you feel ill unless your doctor tells you to stop. In some cases, you may be given additional medicines to help with side effects. Follow all directions for their use. Call your doctor or health care professional for advice if you get a fever, chills or sore throat, or other symptoms of a cold or flu. Do not treat yourself. This drug decreases your body's ability to fight infections. Try to avoid being around people who are sick. This medicine may increase your risk to bruise or bleed. Call your doctor or  health care professional if you notice any unusual bleeding. Be careful brushing and flossing your teeth or using a toothpick because you may get an infection or bleed more easily. If you have any dental work done, tell your dentist you are receiving this medicine. Avoid taking products that contain aspirin, acetaminophen, ibuprofen, naproxen, or ketoprofen unless instructed by your doctor. These medicines may hide a fever. Do not become pregnant while taking this medicine. Women should inform their doctor if they wish to become pregnant or think they might be pregnant. There is a potential for serious side effects to an unborn child. Talk to your health care professional or pharmacist for more information. Do not breast-feed an infant while taking this medicine. Men should inform their doctor if they wish to father a child. This medicine may lower sperm counts. Do not treat diarrhea with over the counter products. Contact your doctor if you have diarrhea that lasts more than 2 days or if it is severe  and watery. This medicine can make you more sensitive to the sun. Keep out of the sun. If you cannot avoid being in the sun, wear protective clothing and use sunscreen. Do not use sun lamps or tanning beds/booths. What side effects may I notice from receiving this medication? Side effects that you should report to your doctor or health care professional as soon as possible: allergic reactions like skin rash, itching or hives, swelling of the face, lips, or tongue low blood counts - this medicine may decrease the number of white blood cells, red blood cells and platelets. You may be at increased risk for infections and bleeding. signs of infection - fever or chills, cough, sore throat, pain or difficulty passing urine signs of decreased platelets or bleeding - bruising, pinpoint red spots on the skin, black, tarry stools, blood in the urine signs of decreased red blood cells - unusually weak or tired,  fainting spells, lightheadedness breathing problems changes in vision chest pain mouth sores nausea and vomiting pain, swelling, redness at site where injected pain, tingling, numbness in the hands or feet redness, swelling, or sores on hands or feet stomach pain unusual bleeding Side effects that usually do not require medical attention (report to your doctor or health care professional if they continue or are bothersome): changes in finger or toe nails diarrhea dry or itchy skin hair loss headache loss of appetite sensitivity of eyes to the light stomach upset unusually teary eyes This list may not describe all possible side effects. Call your doctor for medical advice about side effects. You may report side effects to FDA at 1-800-FDA-1088. Where should I keep my medication? This drug is given in a hospital or clinic and will not be stored at home. NOTE: This sheet is a summary. It may not cover all possible information. If you have questions about this medicine, talk to your doctor, pharmacist, or health care provider.  2022 [employer]/Gold Standard (2020-09-19 00:00:00)

## 2020-12-19 NOTE — Progress Notes (Signed)
[facility] NOTE  Patient Care Team: [name], MD as PCP - General (Family Medicine) Clent Jacks, RN as Oncology Nurse Navigator  CHIEF COMPLAINTS/PURPOSE OF CONSULTATION: colon/rectal cancer  #  Oncology History Overview Note  # Malignant partially obstructing tumor in the transverse colon/70 cm proximal to the anus- C. COLON MASS, 70 CM; COLD BIOPSY:  - INTRAMUCOSAL ADENOCARCINOMA AT LEAST;  # One 20 mm polyp in the transverse colon, removed with mucosal resection. Resected and retrieved. Clips were placed. Tattooed.  # tumor in the mid rectum and at 10 cm proximal to the anus. Biopsied.      SEE COMMENT.   Comment:  There is no definitive evidence of invasion in this sample.  The  findings may not accurately represent the entire underlying lesion;  clinical correlation is recommended.   D. COLON POLYP, TRANSVERSE; HOT SNARE:  - TUBULOVILLOUS ADENOMA.  - NEGATIVE FOR HIGH GRADE DYSPLASIA AND MALIGNANCY.   E. RECTUM MASS; COLD BIOPSY:  - INVASIVE ADENOCARCINOMA, MODERATELY TO POORLY DIFFERENTIATED. ----------------------------------   # PET scan: SEP 2022-proximal right colon [adjacent mesenteric lymph nodes] and rectal hypermetabolism.  Additional subcentimeter bilateral hypermetabolic lymphadenopathy noted in the retroperitoneal/left external iliac; inferior right lobe of the liver concerning for metastatic disease; right lower quadrant soft tissue mass concerning for peritoneal carcinomatosis; bilateral solid pulmonary nodules ~5 mm; MRI rectum- T stage: T4a; N stage:  N1.   # 09/12-2020- FOLFOX chemo [Dr.White/Dr.Vanga]; ADDED BEV with cycle #3-DC 5-FU bolus+LV; # 6-oxaliplatin dose reduced by 20%[thrombocytopenia]   Rectal cancer ([place])  09/11/2020 Initial Diagnosis   Rectal cancer ([place])   09/25/2020 -  Chemotherapy   Patient is on Treatment Plan : COLORECTAL FOLFOX q14d x 4 months     09/28/2020 Cancer Staging   Staging form:  Colon and Rectum, AJCC 8th Edition - Clinical: Stage IVC (cT4a, cN1, cM1c) - Signed by Cammie Sickle, MD on 09/28/2020     Genetic Testing   Negative genetic testing. No pathogenic variants identified on the [facility] Multi-Cancer+RNA Panel. The report date is 11/05/2020.  The Multi-Cancer Panel + RNA offered by [facility] includes sequencing and/or deletion duplication testing of the following 84 genes: AIP, ALK, APC, ATM, AXIN2,BAP1,  BARD1, BLM, BMPR1A, BRCA1, BRCA2, BRIP1, CASR, CDC73, CDH1, CDK4, CDKN1B, CDKN1C, CDKN2A (p14ARF), CDKN2A (p16INK4a), CEBPA, CHEK2, CTNNA1, DICER1, DIS3L2, EGFR (c.2369C>T, p.Thr790Met variant only), EPCAM (Deletion/duplication testing only), FH, FLCN, GATA2, GPC3, GREM1 (Promoter region deletion/duplication testing only), HOXB13 (c.251G>A, p.Gly84Glu), HRAS, KIT, MAX, MEN1, MET, MITF (c.952G>A, p.Glu318Lys variant only), MLH1, MSH2, MSH3, MSH6, MUTYH, NBN, NF1, NF2, NTHL1, PALB2, PDGFRA, PHOX2B, PMS2, POLD1, POLE, POT1, PRKAR1A, PTCH1, PTEN, RAD50, RAD51C, RAD51D, RB1, RECQL4, RET, RUNX1, SDHAF2, SDHA (sequence changes only), SDHB, SDHC, SDHD, SMAD4, SMARCA4, SMARCB1, SMARCE1, STK11, SUFU, TERC, TERT, TMEM127, TP53, TSC1, TSC2, VHL, WRN and WT1.      HISTORY OF PRESENTING ILLNESS: Ambulating independently.  Accompanied by his daughter.  [name] 75 y.o.  male i synchronous colon cancer/rectal cancer-stage IV-on FOLFOX chemotherapy is here for follow-up.  Denies any nausea vomiting.  No fever no chills.  Denies any tingling or numbness.  Appetite good.  Review of Systems  Constitutional:  Negative for chills, diaphoresis, fever and weight loss.  HENT:  Negative for nosebleeds and sore throat.   Eyes:  Negative for double vision.  Respiratory:  Negative for cough, hemoptysis, sputum production, shortness of breath and wheezing.   Cardiovascular:  Negative for chest pain, palpitations, orthopnea and leg swelling.  Gastrointestinal:  Negative for abdominal  pain, blood in stool, constipation, diarrhea, heartburn, melena, nausea and vomiting.  Genitourinary:  Negative for dysuria, frequency and urgency.  Skin: Negative.  Negative for itching and rash.  Neurological:  Negative for dizziness, tingling, focal weakness, weakness and headaches.  Endo/Heme/Allergies:  Does not bruise/bleed easily.  Psychiatric/Behavioral:  Negative for depression. The patient is not nervous/anxious and does not have insomnia.     MEDICAL HISTORY:  Past Medical History:  Diagnosis Date   CAD (coronary artery disease) 06/2014   UA with NSTEMI - cath with 99% prox L circ s/p stent, EF 40% ([place], [place] at [facility])   Emphysema    mild   Ex-smoker    Family history of breast cancer    Family history of colon cancer    Family history of pancreatic cancer    NSTEMI (non-ST elevated myocardial infarction) ([place]) 07/13/2014    SURGICAL HISTORY: Past Surgical History:  Procedure Laterality Date   CARDIAC CATHETERIZATION N/A 07/14/2014   Left Heart Cath and Coronary Angiography with stent placement;  Surgeon: Teodoro Spray, MD   CARDIAC CATHETERIZATION N/A 07/14/2014   Coronary Stent Intervention;  Surgeon: Yolonda Kida, MD   COLONOSCOPY WITH PROPOFOL N/A 09/06/2020   Procedure: COLONOSCOPY WITH PROPOFOL;  Surgeon: Lin Landsman, MD;  Location: [facility] ENDOSCOPY;  Service: Gastroenterology;  Laterality: N/A;   CYSTECTOMY     on back   ESOPHAGOGASTRODUODENOSCOPY N/A 09/06/2020   Procedure: ESOPHAGOGASTRODUODENOSCOPY (EGD);  Surgeon: Lin Landsman, MD;  Location: [facility] ENDOSCOPY;  Service: Gastroenterology;  Laterality: N/A;   INGUINAL HERNIA REPAIR Right 08/17/04   IR IMAGING GUIDED PORT INSERTION  09/13/2020    SOCIAL HISTORY: Social History   Socioeconomic History   Marital status: Married    Spouse name: Not on file   Number of children: Not on file   Years of education: Not on file   Highest education level: Not on file  Occupational History   Not  on file  Tobacco Use   Smoking status: Former    Packs/day: 1.00    Years: 35.00    Pack years: 35.00    Types: Cigarettes    Quit date: 07/07/1998    Years since quitting: 22.4   Smokeless tobacco: Never  Vaping Use   Vaping Use: Never used  Substance and Sexual Activity   Alcohol use: No   Drug use: No   Sexual activity: Not on file  Other Topics Concern   Not on file  Social History Narrative   Caffeine: 2 cups coffee, 2 cups tea/day   Lives with wife   Occupation: Higher education careers adviser, retired   Edu: HS   Activity: works in yard, walking   Diet: some water, fruits/vegetables daily   -------------------------------------------------------------------       [employer] [place]; [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    Social Determinants of Health   Financial Resource Strain: Low Risk    Difficulty of Paying Living Expenses: Not hard at all  Food Insecurity: No Food Insecurity   Worried About Charity fundraiser in the Last Year: Never true   [place] in the Last Year: Never true  Transportation Needs: No Transportation Needs   Lack of Transportation (Medical): No   Lack of Transportation (Non-Medical): No  Physical Activity: Inactive   Days of Exercise per Week: 0 days   Minutes of Exercise per Session: 0 min  Stress: No Stress Concern Present   Feeling of Stress : Not at all  Social Connections: Not on file  Intimate Partner Violence: Not At Risk   Fear of Current or Ex-Partner: No   Emotionally Abused: No   Physically Abused: No   Sexually Abused: No    FAMILY HISTORY: Family History  Problem Relation Age of Onset   Cancer Mother 93       colon   Cancer Sister        breast   Crohn's disease Sister    Cancer Daughter        anal   Crohn's disease Niece    CAD Neg Hx    Stroke Neg Hx    Diabetes Neg Hx    Prostate cancer Neg Hx    Kidney cancer Neg Hx    Bladder Cancer Neg Hx      ALLERGIES:  has No Known Allergies.  MEDICATIONS:  Current Outpatient Medications  Medication Sig Dispense Refill   aspirin EC 81 MG tablet Take 1 tablet (81 mg total) by mouth daily. 60 tablet 1   atorvastatin (LIPITOR) 40 MG tablet Take 1 tablet (40 mg total) by mouth daily at 6 PM. 60 tablet 1   Cholecalciferol (VITAMIN D3) 25 MCG (1000 UT) CAPS Take 1 capsule (1,000 Units total) by mouth daily. 30 capsule    clopidogrel (PLAVIX) 75 MG tablet Take 1 tablet (75 mg total) by mouth daily. 60 tablet 1   Cyanocobalamin (B-12) 1000 MCG SUBL Place 1 tablet under the tongue daily.     ferrous sulfate 324 (65 Fe) MG TBEC Take 1 tablet (325 mg total) by mouth every other day.     lidocaine-prilocaine (EMLA) cream Apply 1 application topically as needed (to port a cath 1 hour prior to each chemotherapy). 30 g 3   metoprolol tartrate (LOPRESSOR) 25 MG tablet Take 1 tablet (25 mg total) by mouth 2 (two) times daily. 60 tablet 1   pantoprazole (PROTONIX) 40 MG tablet Take 1 tablet (40 mg total) by mouth 2 (two) times daily before a meal. 180 tablet 1   ondansetron (ZOFRAN) 8 MG tablet Take 1 tablet (8 mg total) by mouth every 8 (eight) hours as needed for nausea or vomiting. (Patient not taking: Reported on 11/21/2020) 20 tablet 3   prochlorperazine (COMPAZINE) 10 MG tablet Take 1 tablet (10 mg total) by mouth every 6 (six) hours as needed for nausea or vomiting. (Patient not taking: Reported on 11/21/2020) 30 tablet 3   tadalafil (CIALIS) 20 MG tablet TAKE ONE TABLET BY MOUTH DAILY AS NEEDED FOR ERECTILE DYSFUNCTION 20 tablet 3   No current facility-administered medications for this visit.   Facility-Administered Medications Ordered in Other Visits  Medication Dose Route Frequency Provider Last Rate Last Admin   heparin lock flush 100 unit/mL  500 Units Intracatheter Once PRN Charlaine Dalton R, MD       sodium chloride flush (NS) 0.9 % injection 10 mL  10 mL Intravenous PRN Charlaine Dalton  R, MD   10 mL at 10/09/20 0855   sodium chloride flush (NS) 0.9 % injection 10 mL  10 mL Intracatheter PRN Cammie Sickle, MD          .  PHYSICAL EXAMINATION: ECOG PERFORMANCE STATUS: 0 - Asymptomatic  Vitals:   12/19/20 0827  BP: (!) 163/104  Pulse: 66  Resp: 16  Temp: (!) 96.8 F (36 C)  SpO2: 97%   Filed Weights   12/19/20  0827  Weight: 166 lb 12.8 oz (75.7 kg)    Physical Exam Vitals and nursing note reviewed.  HENT:     Head: Normocephalic and atraumatic.     Mouth/Throat:     Pharynx: Oropharynx is clear.  Eyes:     Extraocular Movements: Extraocular movements intact.     Pupils: Pupils are equal, round, and reactive to light.  Cardiovascular:     Rate and Rhythm: Normal rate and regular rhythm.  Pulmonary:     Comments: Decreased breath sounds bilaterally.  Abdominal:     Palpations: Abdomen is soft.  Musculoskeletal:        General: Normal range of motion.     Cervical back: Normal range of motion.  Skin:    General: Skin is warm.  Neurological:     General: No focal deficit present.     Mental Status: He is alert and oriented to person, place, and time.  Psychiatric:        Behavior: Behavior normal.        Judgment: Judgment normal.     LABORATORY DATA:  I have reviewed the data as listed Lab Results  Component Value Date   WBC 5.4 12/19/2020   HGB 12.7 (L) 12/19/2020   HCT 41.1 12/19/2020   MCV 92.4 12/19/2020   PLT 88 (L) 12/19/2020   Recent Labs    11/21/20 0837 12/04/20 0842 12/19/20 0815  NA 137 135 138  K 4.2 4.1 4.2  CL 107 105 104  CO2 25 25 26   GLUCOSE 90 136* 99  BUN 15 17 12   CREATININE 1.44* 1.58* 1.35*  CALCIUM 8.5* 8.6* 8.7*  GFRNONAA 50* 45* 54*  PROT 6.4* 6.7 6.5  ALBUMIN 3.7 3.7 3.5  AST 27 29 40  ALT 21 27 33  ALKPHOS 126 136* 142*  BILITOT 0.3 0.5 0.2*    RADIOGRAPHIC STUDIES: I have personally reviewed the radiological images as listed and agreed with the findings in the report. No results  found.  ASSESSMENT & PLAN:   Rectal cancer ([place])  # STAGE IV- Synchronous primaries a] rectal cancer-10cm-adenocarcinoma & b] transverse colon-- intramucosal adenoca].PET scan: SEP 2022-proximal right colon [adjacent mesenteric lymph nodes] and rectal hypermetabolism + bilateral hypermetabolic lymphadenopathy noted in the retroperitoneal/left external iliac; inferior right lobe of the liver concerning for metastatic disease; right lower quadrant- peritoneal carcinomatosis; bilateral solid pulmonary nodules ~5 mm; MRI rectum- T stage: T4a; N stage:  N1. Currently on pallaitive chemo- FOLFOX; with cycle #3-add bev. Check NGS.   # proceed with FOLFOX #6  today; HOLD BEV- given elevated BP-see below [added Bev with cycle #3]. Labs today reviewed;  acceptable for treatment today. awaiting repeat imaging at this cycle [dec 14th].   # Anemia/iron deficiency hemoglobin ~12- STABLE. ; secondary to chronic GI bleeding/tumor.  #Thrombocytopenia/secondary chemotherapy/on Plavix-aspirin-platelets 88; decrease oxaliplatin dose by 20% [started #6 cycle]  # HTN- 160/109s; recommend checking BP at home/bring log since being on avastin. HOLD avastin today.   # Chronic kidney disease stage III-[creat 1.4] STABLE.   # Vaccination: s/p Flu shot.  # DISPOSITION:  # FOLFOX today; HOLD Bev.  # Follow up in 3 weeks- MD: labs- cbc/cmp; CEA;  FOLFOX+ bev; Pump off in 2 days; -Dr.B   All questions were answered. The patient knows to call the clinic with any problems, questions or concerns.    Cammie Sickle, MD 12/19/2020 8:58 AM

## 2020-12-19 NOTE — Assessment & Plan Note (Addendum)
#   STAGE IV- Synchronous primaries a] rectal cancer-10cm-adenocarcinoma & b] transverse colon-- intramucosal adenoca].PET scan: SEP 2022-proximal right colon [adjacent mesenteric lymph nodes] and rectal hypermetabolism + bilateral hypermetabolic lymphadenopathy noted in the retroperitoneal/left external iliac; inferior right lobe of the liver concerning for metastatic disease; right lower quadrant- peritoneal carcinomatosis; bilateral solid pulmonary nodules ~5 mm; MRI rectum- T stage: T4a; N stage:  N1. Currently on pallaitive chemo- FOLFOX; with cycle #3-add bev. Check NGS.   # proceed with FOLFOX #6  today; HOLD BEV- given elevated BP-see below [added Bev with cycle #3]. Labs today reviewed;  acceptable for treatment today. awaiting repeat imaging at this cycle [dec 14th].   # Anemia/iron deficiency hemoglobin ~12- STABLE. ; secondary to chronic GI bleeding/tumor.  #Thrombocytopenia/secondary chemotherapy/on Plavix-aspirin-platelets 88; decrease oxaliplatin dose by 20% [started #6 cycle]  # HTN- 160/109s; recommend checking BP at home/bring log since being on avastin. HOLD avastin today.   # Chronic kidney disease stage III-[creat 1.4] STABLE.   # Vaccination: s/p Flu shot.  # DISPOSITION:  # FOLFOX today; HOLD Bev.  # Follow up in 3 weeks- MD: labs- cbc/cmp; CEA;  FOLFOX+ bev; Pump off in 2 days; -Dr.B

## 2020-12-19 NOTE — Patient Instructions (Signed)
#  Please bring a log of your blood pressures-review at the next office visit.

## 2020-12-20 ENCOUNTER — Encounter: Payer: Self-pay | Admitting: Internal Medicine

## 2020-12-20 DIAGNOSIS — C20 Malignant neoplasm of rectum: Secondary | ICD-10-CM | POA: Diagnosis not present

## 2020-12-20 LAB — CEA: CEA: 3.8 ng/mL (ref 0.0–4.7)

## 2020-12-21 ENCOUNTER — Encounter: Payer: Self-pay | Admitting: Internal Medicine

## 2020-12-21 ENCOUNTER — Other Ambulatory Visit: Payer: Self-pay

## 2020-12-21 ENCOUNTER — Inpatient Hospital Stay: Payer: Medicare HMO

## 2020-12-21 DIAGNOSIS — C20 Malignant neoplasm of rectum: Secondary | ICD-10-CM

## 2020-12-21 DIAGNOSIS — I129 Hypertensive chronic kidney disease with stage 1 through stage 4 chronic kidney disease, or unspecified chronic kidney disease: Secondary | ICD-10-CM | POA: Diagnosis not present

## 2020-12-21 DIAGNOSIS — Z5111 Encounter for antineoplastic chemotherapy: Secondary | ICD-10-CM | POA: Diagnosis not present

## 2020-12-21 DIAGNOSIS — D696 Thrombocytopenia, unspecified: Secondary | ICD-10-CM | POA: Diagnosis not present

## 2020-12-21 DIAGNOSIS — D509 Iron deficiency anemia, unspecified: Secondary | ICD-10-CM | POA: Diagnosis not present

## 2020-12-21 DIAGNOSIS — Z79899 Other long term (current) drug therapy: Secondary | ICD-10-CM | POA: Diagnosis not present

## 2020-12-21 DIAGNOSIS — N183 Chronic kidney disease, stage 3 unspecified: Secondary | ICD-10-CM | POA: Diagnosis not present

## 2020-12-21 MED ORDER — HEPARIN SOD (PORK) LOCK FLUSH 100 UNIT/ML IV SOLN
500.0000 [IU] | Freq: Once | INTRAVENOUS | Status: AC | PRN
  Administered 2020-12-21: 500 [IU]
  Filled 2020-12-21: qty 5

## 2020-12-21 MED ORDER — SODIUM CHLORIDE 0.9% FLUSH
10.0000 mL | INTRAVENOUS | Status: DC | PRN
  Administered 2020-12-21: 10 mL
  Filled 2020-12-21: qty 10

## 2020-12-21 MED ORDER — HEPARIN SOD (PORK) LOCK FLUSH 100 UNIT/ML IV SOLN
INTRAVENOUS | Status: AC
  Filled 2020-12-21: qty 5

## 2020-12-25 DIAGNOSIS — C20 Malignant neoplasm of rectum: Secondary | ICD-10-CM | POA: Diagnosis not present

## 2020-12-27 ENCOUNTER — Other Ambulatory Visit: Payer: Self-pay

## 2020-12-27 ENCOUNTER — Ambulatory Visit
Admission: RE | Admit: 2020-12-27 | Discharge: 2020-12-27 | Disposition: A | Discharge status: Home / Self Care | Payer: Medicare HMO | Source: Ambulatory Visit | Attending: Internal Medicine | Admitting: Internal Medicine

## 2020-12-27 DIAGNOSIS — N281 Cyst of kidney, acquired: Secondary | ICD-10-CM | POA: Diagnosis not present

## 2020-12-27 DIAGNOSIS — C20 Malignant neoplasm of rectum: Secondary | ICD-10-CM | POA: Insufficient documentation

## 2020-12-27 DIAGNOSIS — I700 Atherosclerosis of aorta: Secondary | ICD-10-CM | POA: Diagnosis not present

## 2020-12-27 DIAGNOSIS — J432 Centrilobular emphysema: Secondary | ICD-10-CM | POA: Diagnosis not present

## 2020-12-27 DIAGNOSIS — J8410 Pulmonary fibrosis, unspecified: Secondary | ICD-10-CM | POA: Diagnosis not present

## 2020-12-27 DIAGNOSIS — K449 Diaphragmatic hernia without obstruction or gangrene: Secondary | ICD-10-CM | POA: Diagnosis not present

## 2020-12-27 DIAGNOSIS — I2510 Atherosclerotic heart disease of native coronary artery without angina pectoris: Secondary | ICD-10-CM | POA: Diagnosis not present

## 2020-12-27 DIAGNOSIS — C189 Malignant neoplasm of colon, unspecified: Secondary | ICD-10-CM | POA: Diagnosis not present

## 2020-12-27 MED ORDER — IOHEXOL 300 MG/ML  SOLN
100.0000 mL | Freq: Once | INTRAMUSCULAR | Status: AC | PRN
  Administered 2020-12-27: 11:00:00 100 mL via INTRAVENOUS

## 2020-12-28 ENCOUNTER — Telehealth: Payer: Self-pay

## 2020-12-28 NOTE — Telephone Encounter (Signed)
Contacted patient's wife and daughter Albert Giles with CT results.  Albert Alstrom, NP reviewed CT results  IMPRESSION: Decreased size of abdominal omental soft tissue nodule since prior exam.   No new or progressive metastatic disease within the chest, abdomen, or pelvis.

## 2020-12-29 ENCOUNTER — Telehealth: Payer: Self-pay

## 2020-12-29 NOTE — Telephone Encounter (Signed)
error 

## 2021-01-09 ENCOUNTER — Inpatient Hospital Stay: Payer: Medicare HMO

## 2021-01-09 ENCOUNTER — Inpatient Hospital Stay (HOSPITAL_BASED_OUTPATIENT_CLINIC_OR_DEPARTMENT_OTHER): Payer: Medicare HMO | Admitting: Internal Medicine

## 2021-01-09 ENCOUNTER — Encounter: Payer: Self-pay | Admitting: Internal Medicine

## 2021-01-09 ENCOUNTER — Other Ambulatory Visit: Payer: Self-pay

## 2021-01-09 DIAGNOSIS — D696 Thrombocytopenia, unspecified: Secondary | ICD-10-CM | POA: Diagnosis not present

## 2021-01-09 DIAGNOSIS — D509 Iron deficiency anemia, unspecified: Secondary | ICD-10-CM | POA: Diagnosis not present

## 2021-01-09 DIAGNOSIS — C20 Malignant neoplasm of rectum: Secondary | ICD-10-CM

## 2021-01-09 DIAGNOSIS — Z5111 Encounter for antineoplastic chemotherapy: Secondary | ICD-10-CM | POA: Diagnosis not present

## 2021-01-09 DIAGNOSIS — I129 Hypertensive chronic kidney disease with stage 1 through stage 4 chronic kidney disease, or unspecified chronic kidney disease: Secondary | ICD-10-CM | POA: Diagnosis not present

## 2021-01-09 DIAGNOSIS — Z79899 Other long term (current) drug therapy: Secondary | ICD-10-CM | POA: Diagnosis not present

## 2021-01-09 DIAGNOSIS — N183 Chronic kidney disease, stage 3 unspecified: Secondary | ICD-10-CM | POA: Diagnosis not present

## 2021-01-09 LAB — COMPREHENSIVE METABOLIC PANEL WITH GFR
ALT: 43 U/L (ref 0–44)
AST: 46 U/L — ABNORMAL HIGH (ref 15–41)
Albumin: 3.6 g/dL (ref 3.5–5.0)
Alkaline Phosphatase: 163 U/L — ABNORMAL HIGH (ref 38–126)
Anion gap: 8 (ref 5–15)
BUN: 15 mg/dL (ref 8–23)
CO2: 24 mmol/L (ref 22–32)
Calcium: 8.8 mg/dL — ABNORMAL LOW (ref 8.9–10.3)
Chloride: 105 mmol/L (ref 98–111)
Creatinine, Ser: 1.47 mg/dL — ABNORMAL HIGH (ref 0.61–1.24)
GFR, Estimated: 49 mL/min — ABNORMAL LOW
Glucose, Bld: 109 mg/dL — ABNORMAL HIGH (ref 70–99)
Potassium: 4 mmol/L (ref 3.5–5.1)
Sodium: 137 mmol/L (ref 135–145)
Total Bilirubin: 0.5 mg/dL (ref 0.3–1.2)
Total Protein: 6.6 g/dL (ref 6.5–8.1)

## 2021-01-09 LAB — CBC WITH DIFFERENTIAL/PLATELET
Abs Immature Granulocytes: 0.03 K/uL (ref 0.00–0.07)
Basophils Absolute: 0.1 K/uL (ref 0.0–0.1)
Basophils Relative: 2 %
Eosinophils Absolute: 0.4 K/uL (ref 0.0–0.5)
Eosinophils Relative: 8 %
HCT: 42.6 % (ref 39.0–52.0)
Hemoglobin: 13.5 g/dL (ref 13.0–17.0)
Immature Granulocytes: 1 %
Lymphocytes Relative: 28 %
Lymphs Abs: 1.4 K/uL (ref 0.7–4.0)
MCH: 30.2 pg (ref 26.0–34.0)
MCHC: 31.7 g/dL (ref 30.0–36.0)
MCV: 95.3 fL (ref 80.0–100.0)
Monocytes Absolute: 0.8 K/uL (ref 0.1–1.0)
Monocytes Relative: 17 %
Neutro Abs: 2.2 K/uL (ref 1.7–7.7)
Neutrophils Relative %: 44 %
Platelets: 119 K/uL — ABNORMAL LOW (ref 150–400)
RBC: 4.47 MIL/uL (ref 4.22–5.81)
RDW: 20.8 % — ABNORMAL HIGH (ref 11.5–15.5)
WBC: 4.9 K/uL (ref 4.0–10.5)
nRBC: 0 % (ref 0.0–0.2)

## 2021-01-09 LAB — URINALYSIS, COMPLETE (UACMP) WITH MICROSCOPIC
Bacteria, UA: NONE SEEN
Bilirubin Urine: NEGATIVE
Glucose, UA: NEGATIVE mg/dL
Ketones, ur: NEGATIVE mg/dL
Leukocytes,Ua: NEGATIVE
Nitrite: NEGATIVE
Protein, ur: NEGATIVE mg/dL
Specific Gravity, Urine: 1.025 (ref 1.005–1.030)
Squamous Epithelial / HPF: NONE SEEN (ref 0–5)
WBC, UA: NONE SEEN WBC/hpf (ref 0–5)
pH: 5.5 (ref 5.0–8.0)

## 2021-01-09 MED ORDER — SODIUM CHLORIDE 0.9 % IV SOLN
2400.0000 mg/m2 | INTRAVENOUS | Status: DC
  Administered 2021-01-09: 13:00:00 4650 mg via INTRAVENOUS
  Filled 2021-01-09: qty 93

## 2021-01-09 MED ORDER — DEXTROSE 5 % IV SOLN
Freq: Once | INTRAVENOUS | Status: AC
  Filled 2021-01-09: qty 250

## 2021-01-09 MED ORDER — PALONOSETRON HCL INJECTION 0.25 MG/5ML
0.2500 mg | Freq: Once | INTRAVENOUS | Status: AC
  Administered 2021-01-09: 09:00:00 0.25 mg via INTRAVENOUS
  Filled 2021-01-09: qty 5

## 2021-01-09 MED ORDER — OXALIPLATIN CHEMO INJECTION 100 MG/20ML
70.0000 mg/m2 | Freq: Once | INTRAVENOUS | Status: AC
  Administered 2021-01-09: 11:00:00 135 mg via INTRAVENOUS
  Filled 2021-01-09: qty 20

## 2021-01-09 MED ORDER — SODIUM CHLORIDE 0.9 % IV SOLN
10.0000 mg/kg | INTRAVENOUS | Status: DC
  Administered 2021-01-09: 10:00:00 800 mg via INTRAVENOUS
  Filled 2021-01-09: qty 32

## 2021-01-09 MED ORDER — SODIUM CHLORIDE 0.9 % IV SOLN
10.0000 mg | Freq: Once | INTRAVENOUS | Status: AC
  Administered 2021-01-09: 09:00:00 10 mg via INTRAVENOUS
  Filled 2021-01-09: qty 10

## 2021-01-09 MED ORDER — SODIUM CHLORIDE 0.9% FLUSH
10.0000 mL | INTRAVENOUS | Status: DC | PRN
  Administered 2021-01-09: 08:00:00 10 mL via INTRAVENOUS
  Filled 2021-01-09: qty 10

## 2021-01-09 MED ORDER — SODIUM CHLORIDE 0.9 % IV SOLN
INTRAVENOUS | Status: DC
  Filled 2021-01-09: qty 250

## 2021-01-09 NOTE — Progress Notes (Signed)
Ok to proceed with tx today and use urine from 12/6 per MD.

## 2021-01-09 NOTE — Assessment & Plan Note (Addendum)
#   STAGE IV- Synchronous primaries a] rectal cancer-10cm-adenocarcinoma & b] transverse colon-- intramucosal adenoca]; abdominal lymphadenopathy; liver metastases; omental metastases.  S/p 6 cycles of FOLFOX plus Bev-CT scan December 27, 2020-CT scan shows partial response with improvement of the omental lesion no progressive disease.   # proceed with FOLFOX #6  today; continue BEV  Labs today reviewed;  acceptable for treatment today.  Not recommend any surgery given the metastatic disease.  # Anemia/iron deficiency hemoglobin ~13-  secondary to chronic GI bleeding/tumor.STABLE.  #Thrombocytopenia/secondary chemotherapy/on Plavix-aspirin-platelets119 decreased oxaliplatin dose by 20% [started #6 cycle]-  STABLE.  # HTN- 160/100; at home BP reviewed- 130-150s; DBP- 90s.continue checking BP at home/bring log since being on avastin. Proceed with avastin today.   # Chronic kidney disease stage III-[creat 1.4] - STABLE.   # Vaccination: s/p Flu shot.  # DISPOSITION:   # FOLFOX +Bev.  # Follow up in 2 weeks- MD: labs- cbc/cmp; CEA;  FOLFOX+ bev; Pump off in 2 days; -Dr.B  # I reviewed the blood work- with the patient in detail; also reviewed the imaging independently [as summarized above]; and with the patient in detail.

## 2021-01-09 NOTE — Progress Notes (Signed)
[place] NOTE  Patient Care Team: [name], MD as PCP - General (Family Medicine) Clent Jacks, RN as Oncology Nurse Navigator  CHIEF COMPLAINTS/PURPOSE OF CONSULTATION: colon/rectal cancer  #  Oncology History Overview Note  # Malignant partially obstructing tumor in the transverse colon/70 cm proximal to the anus- C. COLON MASS, 70 CM; COLD BIOPSY:  - INTRAMUCOSAL ADENOCARCINOMA AT LEAST;  # One 20 mm polyp in the transverse colon, removed with mucosal resection. Resected and retrieved. Clips were placed. Tattooed.  # tumor in the mid rectum and at 10 cm proximal to the anus. Biopsied.      SEE COMMENT.   Comment:  There is no definitive evidence of invasion in this sample.  The  findings may not accurately represent the entire underlying lesion;  clinical correlation is recommended.   D. COLON POLYP, TRANSVERSE; HOT SNARE:  - TUBULOVILLOUS ADENOMA.  - NEGATIVE FOR HIGH GRADE DYSPLASIA AND MALIGNANCY.   E. RECTUM MASS; COLD BIOPSY:  - INVASIVE ADENOCARCINOMA, MODERATELY TO POORLY DIFFERENTIATED. ----------------------------------   # PET scan: SEP 2022-proximal right colon [adjacent mesenteric lymph nodes] and rectal hypermetabolism.  Additional subcentimeter bilateral hypermetabolic lymphadenopathy noted in the retroperitoneal/left external iliac; inferior right lobe of the liver concerning for metastatic disease; right lower quadrant soft tissue mass concerning for peritoneal carcinomatosis; bilateral solid pulmonary nodules ~5 mm; MRI rectum- T stage: T4a; N stage:  N1.   # 09/12-2020- FOLFOX chemo [Dr.White/Dr.Vanga]; ADDED BEV with cycle #3-DC 5-FU bolus+LV; # 6-oxaliplatin dose reduced by 20%[thrombocytopenia]   Rectal cancer ([place])  09/11/2020 Initial Diagnosis   Rectal cancer ([place])   09/25/2020 -  Chemotherapy   Patient is on Treatment Plan : COLORECTAL FOLFOX q14d x 4 months     09/28/2020 Cancer Staging   Staging form:  Colon and Rectum, AJCC 8th Edition - Clinical: Stage IVC (cT4a, cN1, cM1c) - Signed by Cammie Sickle, MD on 09/28/2020     Genetic Testing   Negative genetic testing. No pathogenic variants identified on the [facility] Multi-Cancer+RNA Panel. The report date is 11/05/2020.  The Multi-Cancer Panel + RNA offered by [facility] includes sequencing and/or deletion duplication testing of the following 84 genes: AIP, ALK, APC, ATM, AXIN2,BAP1,  BARD1, BLM, BMPR1A, BRCA1, BRCA2, BRIP1, CASR, CDC73, CDH1, CDK4, CDKN1B, CDKN1C, CDKN2A (p14ARF), CDKN2A (p16INK4a), CEBPA, CHEK2, CTNNA1, DICER1, DIS3L2, EGFR (c.2369C>T, p.Thr790Met variant only), EPCAM (Deletion/duplication testing only), FH, FLCN, GATA2, GPC3, GREM1 (Promoter region deletion/duplication testing only), HOXB13 (c.251G>A, p.Gly84Glu), HRAS, KIT, MAX, MEN1, MET, MITF (c.952G>A, p.Glu318Lys variant only), MLH1, MSH2, MSH3, MSH6, MUTYH, NBN, NF1, NF2, NTHL1, PALB2, PDGFRA, PHOX2B, PMS2, POLD1, POLE, POT1, PRKAR1A, PTCH1, PTEN, RAD50, RAD51C, RAD51D, RB1, RECQL4, RET, RUNX1, SDHAF2, SDHA (sequence changes only), SDHB, SDHC, SDHD, SMAD4, SMARCA4, SMARCB1, SMARCE1, STK11, SUFU, TERC, TERT, TMEM127, TP53, TSC1, TSC2, VHL, WRN and WT1.      HISTORY OF PRESENTING ILLNESS: Ambulating independently.  Accompanied by his daughter.  [name] 75 y.o.  male i synchronous colon cancer/rectal cancer-stage IV-on FOLFOX chemotherapy is here for follow-up/review results of the CT scan.  No tingling or numbness.  No nausea vomiting.  No blood in stools or black-colored stools.  Appetite is good.   Review of Systems  Constitutional:  Negative for chills, diaphoresis, fever and weight loss.  HENT:  Negative for nosebleeds and sore throat.   Eyes:  Negative for double vision.  Respiratory:  Negative for cough, hemoptysis, sputum production, shortness of breath and wheezing.   Cardiovascular:  Negative  for chest pain, palpitations, orthopnea and leg swelling.   Gastrointestinal:  Negative for abdominal pain, blood in stool, constipation, diarrhea, heartburn, melena, nausea and vomiting.  Genitourinary:  Negative for dysuria, frequency and urgency.  Skin: Negative.  Negative for itching and rash.  Neurological:  Negative for dizziness, tingling, focal weakness, weakness and headaches.  Endo/Heme/Allergies:  Does not bruise/bleed easily.  Psychiatric/Behavioral:  Negative for depression. The patient is not nervous/anxious and does not have insomnia.     MEDICAL HISTORY:  Past Medical History:  Diagnosis Date   CAD (coronary artery disease) 06/2014   UA with NSTEMI - cath with 99% prox L circ s/p stent, EF 40% ([place], [place] at [facility])   Emphysema    mild   Ex-smoker    Family history of breast cancer    Family history of colon cancer    Family history of pancreatic cancer    NSTEMI (non-ST elevated myocardial infarction) ([place]) 07/13/2014    SURGICAL HISTORY: Past Surgical History:  Procedure Laterality Date   CARDIAC CATHETERIZATION N/A 07/14/2014   Left Heart Cath and Coronary Angiography with stent placement;  Surgeon: Teodoro Spray, MD   CARDIAC CATHETERIZATION N/A 07/14/2014   Coronary Stent Intervention;  Surgeon: Yolonda Kida, MD   COLONOSCOPY WITH PROPOFOL N/A 09/06/2020   Procedure: COLONOSCOPY WITH PROPOFOL;  Surgeon: Lin Landsman, MD;  Location: [facility] ENDOSCOPY;  Service: Gastroenterology;  Laterality: N/A;   CYSTECTOMY     on back   ESOPHAGOGASTRODUODENOSCOPY N/A 09/06/2020   Procedure: ESOPHAGOGASTRODUODENOSCOPY (EGD);  Surgeon: Lin Landsman, MD;  Location: [facility] ENDOSCOPY;  Service: Gastroenterology;  Laterality: N/A;   INGUINAL HERNIA REPAIR Right 08/17/04   IR IMAGING GUIDED PORT INSERTION  09/13/2020    SOCIAL HISTORY: Social History   Socioeconomic History   Marital status: Married    Spouse name: Not on file   Number of children: Not on file   Years of education: Not on file   Highest education  level: Not on file  Occupational History   Not on file  Tobacco Use   Smoking status: Former    Packs/day: 1.00    Years: 35.00    Pack years: 35.00    Types: Cigarettes    Quit date: 07/07/1998    Years since quitting: 22.5   Smokeless tobacco: Never  Vaping Use   Vaping Use: Never used  Substance and Sexual Activity   Alcohol use: No   Drug use: No   Sexual activity: Yes  Other Topics Concern   Not on file  Social History Narrative   Caffeine: 2 cups coffee, 2 cups tea/day   Lives with wife   Occupation: Higher education careers adviser, retired   Edu: HS   Activity: works in yard, walking   Diet: some water, fruits/vegetables daily   -------------------------------------------------------------------       [employer] [place]; [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    Social Determinants of Health   Financial Resource Strain: Low Risk    Difficulty of Paying Living Expenses: Not hard at all  Food Insecurity: No Food Insecurity   Worried About Charity fundraiser in the Last Year: Never true   [place] in the Last Year: Never true  Transportation Needs: No Transportation Needs   Lack of Transportation (Medical): No   Lack of Transportation (Non-Medical): No  Physical Activity: Inactive   Days of Exercise per Week: 0 days  Minutes of Exercise per Session: 0 min  Stress: No Stress Concern Present   Feeling of Stress : Not at all  Social Connections: Not on file  Intimate Partner Violence: Not At Risk   Fear of Current or Ex-Partner: No   Emotionally Abused: No   Physically Abused: No   Sexually Abused: No    FAMILY HISTORY: Family History  Problem Relation Age of Onset   Cancer Mother 19       colon   Cancer Sister        breast   Crohn's disease Sister    Cancer Daughter        anal   Crohn's disease Niece    CAD Neg Hx    Stroke Neg Hx    Diabetes Neg Hx    Prostate cancer Neg Hx    Kidney cancer  Neg Hx    Bladder Cancer Neg Hx     ALLERGIES:  has No Known Allergies.  MEDICATIONS:  Current Outpatient Medications  Medication Sig Dispense Refill   aspirin EC 81 MG tablet Take 1 tablet (81 mg total) by mouth daily. 60 tablet 1   atorvastatin (LIPITOR) 40 MG tablet Take 1 tablet (40 mg total) by mouth daily at 6 PM. 60 tablet 1   Cholecalciferol (VITAMIN D3) 25 MCG (1000 UT) CAPS Take 1 capsule (1,000 Units total) by mouth daily. 30 capsule    clopidogrel (PLAVIX) 75 MG tablet Take 1 tablet (75 mg total) by mouth daily. 60 tablet 1   Cyanocobalamin (B-12) 1000 MCG SUBL Place 1 tablet under the tongue daily.     lidocaine-prilocaine (EMLA) cream Apply 1 application topically as needed (to port a cath 1 hour prior to each chemotherapy). 30 g 3   metoprolol tartrate (LOPRESSOR) 25 MG tablet Take 1 tablet (25 mg total) by mouth 2 (two) times daily. 60 tablet 1   ondansetron (ZOFRAN) 8 MG tablet Take 1 tablet (8 mg total) by mouth every 8 (eight) hours as needed for nausea or vomiting. 20 tablet 3   prochlorperazine (COMPAZINE) 10 MG tablet Take 1 tablet (10 mg total) by mouth every 6 (six) hours as needed for nausea or vomiting. 30 tablet 3   tadalafil (CIALIS) 20 MG tablet TAKE ONE TABLET BY MOUTH DAILY AS NEEDED FOR ERECTILE DYSFUNCTION 20 tablet 3   ferrous sulfate 324 (65 Fe) MG TBEC Take 1 tablet (325 mg total) by mouth every other day.     pantoprazole (PROTONIX) 40 MG tablet Take 1 tablet (40 mg total) by mouth 2 (two) times daily before a meal. 180 tablet 1   No current facility-administered medications for this visit.   Facility-Administered Medications Ordered in Other Visits  Medication Dose Route Frequency Provider Last Rate Last Admin   0.9 %  sodium chloride infusion   Intravenous Continuous Cammie Sickle, MD 20 mL/hr at 01/09/21 0913 New Bag at 01/09/21 0913   bevacizumab-awwb (MVASI) 800 mg in sodium chloride 0.9 % 100 mL chemo infusion  10 mg/kg (Treatment Plan  Recorded) Intravenous Q14 Days Cammie Sickle, MD 264 mL/hr at 01/09/21 0950 800 mg at 01/09/21 0950   fluorouracil (ADRUCIL) 4,650 mg in sodium chloride 0.9 % 57 mL chemo infusion  2,400 mg/m2 (Treatment Plan Recorded) Intravenous 1 day or 1 dose Charlaine Dalton R, MD       heparin lock flush 100 unit/mL  500 Units Intracatheter Once PRN Cammie Sickle, MD       sodium chloride  flush (NS) 0.9 % injection 10 mL  10 mL Intravenous PRN Charlaine Dalton R, MD   10 mL at 10/09/20 0855   sodium chloride flush (NS) 0.9 % injection 10 mL  10 mL Intracatheter PRN Cammie Sickle, MD       sodium chloride flush (NS) 0.9 % injection 10 mL  10 mL Intravenous PRN Cammie Sickle, MD   10 mL at 01/09/21 0814      .  PHYSICAL EXAMINATION: ECOG PERFORMANCE STATUS: 0 - Asymptomatic  Vitals:   01/09/21 0828  BP: (!) 164/100  Pulse: 67  Temp: (!) 95.6 F (35.3 C)  SpO2: 100%   Filed Weights   01/09/21 0828  Weight: 168 lb 12.8 oz (76.6 kg)    Physical Exam Vitals and nursing note reviewed.  HENT:     Head: Normocephalic and atraumatic.     Mouth/Throat:     Pharynx: Oropharynx is clear.  Eyes:     Extraocular Movements: Extraocular movements intact.     Pupils: Pupils are equal, round, and reactive to light.  Cardiovascular:     Rate and Rhythm: Normal rate and regular rhythm.  Pulmonary:     Comments: Decreased breath sounds bilaterally.  Abdominal:     Palpations: Abdomen is soft.  Musculoskeletal:        General: Normal range of motion.     Cervical back: Normal range of motion.  Skin:    General: Skin is warm.  Neurological:     General: No focal deficit present.     Mental Status: He is alert and oriented to person, place, and time.  Psychiatric:        Behavior: Behavior normal.        Judgment: Judgment normal.     LABORATORY DATA:  I have reviewed the data as listed Lab Results  Component Value Date   WBC 4.9 01/09/2021   HGB 13.5  01/09/2021   HCT 42.6 01/09/2021   MCV 95.3 01/09/2021   PLT 119 (L) 01/09/2021   Recent Labs    12/04/20 0842 12/19/20 0815 01/09/21 0814  NA 135 138 137  K 4.1 4.2 4.0  CL 105 104 105  CO2 _0 GLUCOSE 136* 99 109*  BUN _1 CREATININE 1.58* 1.35* 1.47*  CALCIUM 8.6* 8.7* 8.8*  GFRNONAA 45* 54* 49*  PROT 6.7 6.5 6.6  ALBUMIN 3.7 3.5 3.6  AST 29 40 46*  ALT 27 33 43  ALKPHOS 136* 142* 163*  BILITOT 0.5 0.2* 0.5    RADIOGRAPHIC STUDIES: I have personally reviewed the radiological images as listed and agreed with the findings in the report. CT CHEST ABDOMEN PELVIS W CONTRAST  Result Date: 12/28/2020 CLINICAL DATA:  Follow-up colorectal carcinoma. Undergoing chemotherapy. EXAM: CT CHEST, ABDOMEN, AND PELVIS WITH CONTRAST TECHNIQUE: Multidetector CT imaging of the chest, abdomen and pelvis was performed following the standard protocol during bolus administration of intravenous contrast. CONTRAST:  165m OMNIPAQUE IOHEXOL 300 MG/ML  SOLN COMPARISON:  PET-CT on 09/21/2020 FINDINGS: CT CHEST FINDINGS Cardiovascular: No acute findings. Aortic and coronary atherosclerotic calcification noted. Mediastinum/Lymph Nodes: No masses or pathologically enlarged lymph nodes identified. Tiny hiatal hernia is seen, with mild diffuse esophageal wall thickening, consistent with esophagitis. Lungs/Pleura: Moderate centrilobular emphysema again demonstrated. Small calcified granuloma in the lateral right midlung remain stable as well as other tiny sub-cm nodules in the right anterior mid lung. No suspicious pulmonary nodules or masses identified. No evidence of infiltrate or  pleural effusion. Musculoskeletal:  No suspicious bone lesions identified. CT ABDOMEN AND PELVIS FINDINGS Hepatobiliary: No masses identified. Gallbladder is unremarkable. No evidence of biliary ductal dilatation. Pancreas:  No mass or inflammatory changes. Spleen:  Within normal limits in size and appearance. Adrenals/Urinary  tract: Subcapsular cyst seen in anterior lower pole of right kidney measuring 1.3 cm, and is stable. Another small cyst is seen in posterior midpole of right kidney. No masses or hydronephrosis. Stomach/Bowel: Tiny hiatal hernia noted. No evidence of obstruction, inflammatory process, or abnormal fluid collections. Normal appendix visualized. Vascular/Lymphatic: No pathologically enlarged lymph nodes identified. No acute vascular findings. Aortic atherosclerotic calcification noted. Reproductive:  No mass or other significant abnormality identified. Other: A soft tissue nodule in the lower abdominal omental fat near the midline measures 1.5 x 1.2 cm on image 82/2, compared to 2.9 x 2.3 cm on prior study. No other new or increased masses are identified. Musculoskeletal:  No suspicious bone lesions identified. IMPRESSION: Decreased size of abdominal omental soft tissue nodule since prior exam. No new or progressive metastatic disease within the chest, abdomen, or pelvis. Tiny hiatal hernia and findings of esophagitis. Aortic Atherosclerosis (ICD10-I70.0) and Emphysema (ICD10-J43.9). Electronically Signed   By: Marlaine Hind M.D.   On: 12/28/2020 13:52    ASSESSMENT & PLAN:   Rectal cancer ([employer])  # STAGE IV- Synchronous primaries a] rectal cancer-10cm-adenocarcinoma & b] transverse colon-- intramucosal adenoca]; abdominal lymphadenopathy; liver metastases; omental metastases.  S/p 6 cycles of FOLFOX plus Bev-CT scan December 27, 2020-CT scan shows partial response with improvement of the omental lesion no progressive disease.    # proceed with FOLFOX #6  today; continue BEV  Labs today reviewed;  acceptable for treatment today.  Not recommend any surgery given the metastatic disease.  # Anemia/iron deficiency hemoglobin ~13-  secondary to chronic GI bleeding/tumor.STABLE.  #Thrombocytopenia/secondary chemotherapy/on Plavix-aspirin-platelets119 decreased oxaliplatin dose by 20% [started #6 cycle]-  STABLE.    # HTN- 160/100; at home BP reviewed- 130-150s; DBP- 90s.continue checking BP at home/bring log since being on avastin. Proceed with avastin today.   # Chronic kidney disease stage III-[creat 1.4] - STABLE.    # Vaccination: s/p Flu shot.  # DISPOSITION:   # FOLFOX +Bev.  # Follow up in 2 weeks- MD: labs- cbc/cmp; CEA;  FOLFOX+ bev; Pump off in 2 days; -Dr.B  # I reviewed the blood work- with the patient in detail; also reviewed the imaging independently [as summarized above]; and with the patient in detail.      All questions were answered. The patient knows to call the clinic with any problems, questions or concerns.    Cammie Sickle, MD 01/09/2021 12:34 PM

## 2021-01-10 LAB — CEA: CEA: 3.3 ng/mL (ref 0.0–4.7)

## 2021-01-11 ENCOUNTER — Other Ambulatory Visit: Payer: Self-pay

## 2021-01-11 ENCOUNTER — Inpatient Hospital Stay: Payer: Medicare HMO

## 2021-01-11 DIAGNOSIS — I129 Hypertensive chronic kidney disease with stage 1 through stage 4 chronic kidney disease, or unspecified chronic kidney disease: Secondary | ICD-10-CM | POA: Diagnosis not present

## 2021-01-11 DIAGNOSIS — N183 Chronic kidney disease, stage 3 unspecified: Secondary | ICD-10-CM | POA: Diagnosis not present

## 2021-01-11 DIAGNOSIS — Z5111 Encounter for antineoplastic chemotherapy: Secondary | ICD-10-CM | POA: Diagnosis not present

## 2021-01-11 DIAGNOSIS — C20 Malignant neoplasm of rectum: Secondary | ICD-10-CM

## 2021-01-11 DIAGNOSIS — Z79899 Other long term (current) drug therapy: Secondary | ICD-10-CM | POA: Diagnosis not present

## 2021-01-11 DIAGNOSIS — D509 Iron deficiency anemia, unspecified: Secondary | ICD-10-CM | POA: Diagnosis not present

## 2021-01-11 DIAGNOSIS — D696 Thrombocytopenia, unspecified: Secondary | ICD-10-CM | POA: Diagnosis not present

## 2021-01-11 MED ORDER — SODIUM CHLORIDE 0.9% FLUSH
10.0000 mL | INTRAVENOUS | Status: DC | PRN
  Administered 2021-01-11: 13:00:00 10 mL
  Filled 2021-01-11: qty 10

## 2021-01-11 MED ORDER — HEPARIN SOD (PORK) LOCK FLUSH 100 UNIT/ML IV SOLN
500.0000 [IU] | Freq: Once | INTRAVENOUS | Status: AC | PRN
  Administered 2021-01-11: 13:00:00 500 [IU]
  Filled 2021-01-11: qty 5

## 2021-01-22 ENCOUNTER — Inpatient Hospital Stay: Payer: Medicare HMO

## 2021-01-22 ENCOUNTER — Other Ambulatory Visit: Payer: Self-pay

## 2021-01-22 ENCOUNTER — Inpatient Hospital Stay: Payer: Medicare HMO | Attending: Internal Medicine

## 2021-01-22 ENCOUNTER — Encounter: Payer: Self-pay | Admitting: Internal Medicine

## 2021-01-22 ENCOUNTER — Inpatient Hospital Stay (HOSPITAL_BASED_OUTPATIENT_CLINIC_OR_DEPARTMENT_OTHER): Payer: Medicare HMO | Admitting: Internal Medicine

## 2021-01-22 DIAGNOSIS — C20 Malignant neoplasm of rectum: Secondary | ICD-10-CM

## 2021-01-22 DIAGNOSIS — Z5111 Encounter for antineoplastic chemotherapy: Secondary | ICD-10-CM | POA: Diagnosis not present

## 2021-01-22 DIAGNOSIS — Z79899 Other long term (current) drug therapy: Secondary | ICD-10-CM | POA: Diagnosis not present

## 2021-01-22 DIAGNOSIS — D509 Iron deficiency anemia, unspecified: Secondary | ICD-10-CM | POA: Insufficient documentation

## 2021-01-22 DIAGNOSIS — N183 Chronic kidney disease, stage 3 unspecified: Secondary | ICD-10-CM | POA: Diagnosis not present

## 2021-01-22 DIAGNOSIS — I129 Hypertensive chronic kidney disease with stage 1 through stage 4 chronic kidney disease, or unspecified chronic kidney disease: Secondary | ICD-10-CM | POA: Insufficient documentation

## 2021-01-22 DIAGNOSIS — C787 Secondary malignant neoplasm of liver and intrahepatic bile duct: Secondary | ICD-10-CM | POA: Insufficient documentation

## 2021-01-22 DIAGNOSIS — C786 Secondary malignant neoplasm of retroperitoneum and peritoneum: Secondary | ICD-10-CM | POA: Diagnosis not present

## 2021-01-22 LAB — CBC WITH DIFFERENTIAL/PLATELET
Abs Immature Granulocytes: 0.02 10*3/uL (ref 0.00–0.07)
Basophils Absolute: 0.1 10*3/uL (ref 0.0–0.1)
Basophils Relative: 1 %
Eosinophils Absolute: 0.3 10*3/uL (ref 0.0–0.5)
Eosinophils Relative: 5 %
HCT: 43.1 % (ref 39.0–52.0)
Hemoglobin: 13.7 g/dL (ref 13.0–17.0)
Immature Granulocytes: 0 %
Lymphocytes Relative: 19 %
Lymphs Abs: 1.2 10*3/uL (ref 0.7–4.0)
MCH: 30.8 pg (ref 26.0–34.0)
MCHC: 31.8 g/dL (ref 30.0–36.0)
MCV: 96.9 fL (ref 80.0–100.0)
Monocytes Absolute: 0.6 10*3/uL (ref 0.1–1.0)
Monocytes Relative: 10 %
Neutro Abs: 4 10*3/uL (ref 1.7–7.7)
Neutrophils Relative %: 65 %
Platelets: 103 10*3/uL — ABNORMAL LOW (ref 150–400)
RBC: 4.45 MIL/uL (ref 4.22–5.81)
RDW: 18.7 % — ABNORMAL HIGH (ref 11.5–15.5)
WBC: 6.1 10*3/uL (ref 4.0–10.5)
nRBC: 0 % (ref 0.0–0.2)

## 2021-01-22 LAB — URINALYSIS, COMPLETE (UACMP) WITH MICROSCOPIC
Bacteria, UA: NONE SEEN
Bilirubin Urine: NEGATIVE
Glucose, UA: NEGATIVE mg/dL
Hgb urine dipstick: NEGATIVE
Ketones, ur: NEGATIVE mg/dL
Leukocytes,Ua: NEGATIVE
Nitrite: NEGATIVE
Protein, ur: NEGATIVE mg/dL
Specific Gravity, Urine: 1.018 (ref 1.005–1.030)
Squamous Epithelial / HPF: NONE SEEN (ref 0–5)
pH: 5 (ref 5.0–8.0)

## 2021-01-22 LAB — COMPREHENSIVE METABOLIC PANEL
ALT: 37 U/L (ref 0–44)
AST: 36 U/L (ref 15–41)
Albumin: 3.5 g/dL (ref 3.5–5.0)
Alkaline Phosphatase: 177 U/L — ABNORMAL HIGH (ref 38–126)
Anion gap: 4 — ABNORMAL LOW (ref 5–15)
BUN: 16 mg/dL (ref 8–23)
CO2: 24 mmol/L (ref 22–32)
Calcium: 8.7 mg/dL — ABNORMAL LOW (ref 8.9–10.3)
Chloride: 107 mmol/L (ref 98–111)
Creatinine, Ser: 1.48 mg/dL — ABNORMAL HIGH (ref 0.61–1.24)
GFR, Estimated: 48 mL/min — ABNORMAL LOW (ref >60)
Glucose, Bld: 127 mg/dL — ABNORMAL HIGH (ref 70–99)
Potassium: 4.1 mmol/L (ref 3.5–5.1)
Sodium: 135 mmol/L (ref 135–145)
Total Bilirubin: 0.7 mg/dL (ref 0.3–1.2)
Total Protein: 6.7 g/dL (ref 6.5–8.1)

## 2021-01-22 MED ORDER — SODIUM CHLORIDE 0.9 % IV SOLN
2400.0000 mg/m2 | INTRAVENOUS | Status: DC
  Administered 2021-01-22: 4650 mg via INTRAVENOUS
  Filled 2021-01-22: qty 93

## 2021-01-22 MED ORDER — PALONOSETRON HCL INJECTION 0.25 MG/5ML
0.2500 mg | Freq: Once | INTRAVENOUS | Status: AC
  Administered 2021-01-22: 0.25 mg via INTRAVENOUS
  Filled 2021-01-22: qty 5

## 2021-01-22 MED ORDER — SODIUM CHLORIDE 0.9 % IV SOLN
10.0000 mg | Freq: Once | INTRAVENOUS | Status: AC
  Administered 2021-01-22: 10 mg via INTRAVENOUS
  Filled 2021-01-22: qty 10

## 2021-01-22 MED ORDER — SODIUM CHLORIDE 0.9 % IV SOLN
10.0000 mg/kg | INTRAVENOUS | Status: DC
  Administered 2021-01-22: 800 mg via INTRAVENOUS
  Filled 2021-01-22: qty 32

## 2021-01-22 MED ORDER — SODIUM CHLORIDE 0.9 % IV SOLN
INTRAVENOUS | Status: DC
  Filled 2021-01-22: qty 250

## 2021-01-22 MED ORDER — DEXTROSE 5 % IV SOLN
Freq: Once | INTRAVENOUS | Status: AC
  Filled 2021-01-22: qty 250

## 2021-01-22 MED ORDER — OXALIPLATIN CHEMO INJECTION 100 MG/20ML
70.0000 mg/m2 | Freq: Once | INTRAVENOUS | Status: AC
  Administered 2021-01-22: 135 mg via INTRAVENOUS
  Filled 2021-01-22: qty 10

## 2021-01-22 NOTE — Progress Notes (Signed)
[name] NOTE  Patient Care Team: [name], MD as PCP - General (Family Medicine) Clent Jacks, RN as Oncology Nurse Navigator  CHIEF COMPLAINTS/PURPOSE OF CONSULTATION: colon/rectal cancer  #  Oncology History Overview Note  # Malignant partially obstructing tumor in the transverse colon/70 cm proximal to the anus- C. COLON MASS, 70 CM; COLD BIOPSY:  - INTRAMUCOSAL ADENOCARCINOMA AT LEAST;  # One 20 mm polyp in the transverse colon, removed with mucosal resection. Resected and retrieved. Clips were placed. Tattooed.  # tumor in the mid rectum and at 10 cm proximal to the anus. Biopsied.      SEE COMMENT.   Comment:  There is no definitive evidence of invasion in this sample.  The  findings may not accurately represent the entire underlying lesion;  clinical correlation is recommended.   D. COLON POLYP, TRANSVERSE; HOT SNARE:  - TUBULOVILLOUS ADENOMA.  - NEGATIVE FOR HIGH GRADE DYSPLASIA AND MALIGNANCY.   E. RECTUM MASS; COLD BIOPSY:  - INVASIVE ADENOCARCINOMA, MODERATELY TO POORLY DIFFERENTIATED. ----------------------------------   # PET scan: SEP 2022-proximal right colon [adjacent mesenteric lymph nodes] and rectal hypermetabolism.  Additional subcentimeter bilateral hypermetabolic lymphadenopathy noted in the retroperitoneal/left external iliac; inferior right lobe of the liver concerning for metastatic disease; right lower quadrant soft tissue mass concerning for peritoneal carcinomatosis; bilateral solid pulmonary nodules ~5 mm; MRI rectum- T stage: T4a; N stage:  N1.   # 09/12-2020- FOLFOX chemo [Dr.White/Dr.Vanga]; ADDED BEV with cycle #3-DC 5-FU bolus+LV; # 6-oxaliplatin dose reduced by 20%[thrombocytopenia]   Rectal cancer ([name])  09/11/2020 Initial Diagnosis   Rectal cancer ([name])   09/25/2020 -  Chemotherapy   Patient is on Treatment Plan : COLORECTAL FOLFOX q14d x 4 months     09/28/2020 Cancer Staging   Staging form:  Colon and Rectum, AJCC 8th Edition - Clinical: Stage IVC (cT4a, cN1, cM1c) - Signed by Cammie Sickle, MD on 09/28/2020     Genetic Testing   Negative genetic testing. No pathogenic variants identified on the [facility] Multi-Cancer+RNA Panel. The report date is 11/05/2020.  The Multi-Cancer Panel + RNA offered by [facility] includes sequencing and/or deletion duplication testing of the following 84 genes: AIP, ALK, APC, ATM, AXIN2,BAP1,  BARD1, BLM, BMPR1A, BRCA1, BRCA2, BRIP1, CASR, CDC73, CDH1, CDK4, CDKN1B, CDKN1C, CDKN2A (p14ARF), CDKN2A (p16INK4a), CEBPA, CHEK2, CTNNA1, DICER1, DIS3L2, EGFR (c.2369C>T, p.Thr790Met variant only), EPCAM (Deletion/duplication testing only), FH, FLCN, GATA2, GPC3, GREM1 (Promoter region deletion/duplication testing only), HOXB13 (c.251G>A, p.Gly84Glu), HRAS, KIT, MAX, MEN1, MET, MITF (c.952G>A, p.Glu318Lys variant only), MLH1, MSH2, MSH3, MSH6, MUTYH, NBN, NF1, NF2, NTHL1, PALB2, PDGFRA, PHOX2B, PMS2, POLD1, POLE, POT1, PRKAR1A, PTCH1, PTEN, RAD50, RAD51C, RAD51D, RB1, RECQL4, RET, RUNX1, SDHAF2, SDHA (sequence changes only), SDHB, SDHC, SDHD, SMAD4, SMARCA4, SMARCB1, SMARCE1, STK11, SUFU, TERC, TERT, TMEM127, TP53, TSC1, TSC2, VHL, WRN and WT1.      HISTORY OF PRESENTING ILLNESS: Ambulating independently.  Accompanied by his daughter.  Albert Giles 76 y.o.  male synchronous colon cancer/rectal cancer-stage IV-on FOLFOX chemotherapy is here for follow-up.  Denies any worsening shortness of breath or cough.  Denies any fevers or chills.  No tingling or numbness.   No blood in stools or black-colored stools.  Appetite is good.  Review of Systems  Constitutional:  Negative for chills, diaphoresis, fever and weight loss.  HENT:  Negative for nosebleeds and sore throat.   Eyes:  Negative for double vision.  Respiratory:  Negative for cough, hemoptysis, sputum production, shortness of breath and wheezing.  Cardiovascular:  Negative for chest pain, palpitations,  orthopnea and leg swelling.  Gastrointestinal:  Negative for abdominal pain, blood in stool, constipation, diarrhea, heartburn, melena, nausea and vomiting.  Genitourinary:  Negative for dysuria, frequency and urgency.  Skin: Negative.  Negative for itching and rash.  Neurological:  Negative for dizziness, tingling, focal weakness, weakness and headaches.  Endo/Heme/Allergies:  Does not bruise/bleed easily.  Psychiatric/Behavioral:  Negative for depression. The patient is not nervous/anxious and does not have insomnia.     MEDICAL HISTORY:  Past Medical History:  Diagnosis Date   CAD (coronary artery disease) 06/2014   UA with NSTEMI - cath with 99% prox L circ s/p stent, EF 40% ([place], [place] [facility])   Emphysema    mild   Ex-smoker    Family history of breast cancer    Family history of colon cancer    Family history of pancreatic cancer    NSTEMI (non-ST elevated myocardial infarction) ([name]) 07/13/2014    SURGICAL HISTORY: Past Surgical History:  Procedure Laterality Date   CARDIAC CATHETERIZATION N/A 07/14/2014   Left Heart Cath and Coronary Angiography with stent placement;  Surgeon: Teodoro Spray, MD   CARDIAC CATHETERIZATION N/A 07/14/2014   Coronary Stent Intervention;  Surgeon: Yolonda Kida, MD   COLONOSCOPY WITH PROPOFOL N/A 09/06/2020   Procedure: COLONOSCOPY WITH PROPOFOL;  Surgeon: Lin Landsman, MD;  Location: [facility] ENDOSCOPY;  Service: Gastroenterology;  Laterality: N/A;   CYSTECTOMY     on back   ESOPHAGOGASTRODUODENOSCOPY N/A 09/06/2020   Procedure: ESOPHAGOGASTRODUODENOSCOPY (EGD);  Surgeon: Lin Landsman, MD;  Location: [facility];  Service: Gastroenterology;  Laterality: N/A;   INGUINAL HERNIA REPAIR Right 08/17/04   IR IMAGING GUIDED PORT INSERTION  09/13/2020    SOCIAL HISTORY: Social History   Socioeconomic History   Marital status: Married    Spouse name: Not on file   Number of children: Not on file   Years of education: Not on  file   Highest education level: Not on file  Occupational History   Not on file  Tobacco Use   Smoking status: Former    Packs/day: 1.00    Years: 35.00    Pack years: 35.00    Types: Cigarettes    Quit date: 07/07/1998    Years since quitting: 22.5   Smokeless tobacco: Never  Vaping Use   Vaping Use: Never used  Substance and Sexual Activity   Alcohol use: No   Drug use: No   Sexual activity: Yes  Other Topics Concern   Not on file  Social History Narrative   Caffeine: 2 cups coffee, 2 cups tea/day   Lives with wife   Occupation: Higher education careers adviser, retired   Edu: HS   Activity: works in yard, walking   Diet: some water, fruits/vegetables daily   -------------------------------------------------------------------       [employer] [place]; [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    Social Determinants of Health   Financial Resource Strain: Low Risk    Difficulty of Paying Living Expenses: Not hard at all  Food Insecurity: No Food Insecurity   Worried About Charity fundraiser in the Last Year: Never true   [place] in the Last Year: Never true  Transportation Needs: No Transportation Needs   Lack of Transportation (Medical): No   Lack of Transportation (Non-Medical): No  Physical Activity: Inactive   Days of Exercise per Week: 0  days   Minutes of Exercise per Session: 0 min  Stress: No Stress Concern Present   Feeling of Stress : Not at all  Social Connections: Not on file  Intimate Partner Violence: Not At Risk   Fear of Current or Ex-Partner: No   Emotionally Abused: No   Physically Abused: No   Sexually Abused: No    FAMILY HISTORY: Family History  Problem Relation Age of Onset   Cancer Mother 85       colon   Cancer Sister        breast   Crohn's disease Sister    Cancer Daughter        anal   Crohn's disease Niece    CAD Neg Hx    Stroke Neg Hx    Diabetes Neg Hx    Prostate  cancer Neg Hx    Kidney cancer Neg Hx    Bladder Cancer Neg Hx     ALLERGIES:  has No Known Allergies.  MEDICATIONS:  Current Outpatient Medications  Medication Sig Dispense Refill   aspirin EC 81 MG tablet Take 1 tablet (81 mg total) by mouth daily. 60 tablet 1   atorvastatin (LIPITOR) 40 MG tablet Take 1 tablet (40 mg total) by mouth daily at 6 PM. 60 tablet 1   Cholecalciferol (VITAMIN D3) 25 MCG (1000 UT) CAPS Take 1 capsule (1,000 Units total) by mouth daily. 30 capsule    clopidogrel (PLAVIX) 75 MG tablet Take 1 tablet (75 mg total) by mouth daily. 60 tablet 1   Cyanocobalamin (B-12) 1000 MCG SUBL Place 1 tablet under the tongue daily.     lidocaine-prilocaine (EMLA) cream Apply 1 application topically as needed (to port a cath 1 hour prior to each chemotherapy). 30 g 3   metoprolol tartrate (LOPRESSOR) 25 MG tablet Take 1 tablet (25 mg total) by mouth 2 (two) times daily. 60 tablet 1   ondansetron (ZOFRAN) 8 MG tablet Take 1 tablet (8 mg total) by mouth every 8 (eight) hours as needed for nausea or vomiting. 20 tablet 3   prochlorperazine (COMPAZINE) 10 MG tablet Take 1 tablet (10 mg total) by mouth every 6 (six) hours as needed for nausea or vomiting. 30 tablet 3   tadalafil (CIALIS) 20 MG tablet TAKE ONE TABLET BY MOUTH DAILY AS NEEDED FOR ERECTILE DYSFUNCTION 20 tablet 3   ferrous sulfate 324 (65 Fe) MG TBEC Take 1 tablet (325 mg total) by mouth every other day.     pantoprazole (PROTONIX) 40 MG tablet Take 1 tablet (40 mg total) by mouth 2 (two) times daily before a meal. 180 tablet 1   No current facility-administered medications for this visit.   Facility-Administered Medications Ordered in Other Visits  Medication Dose Route Frequency Provider Last Rate Last Admin   0.9 %  sodium chloride infusion   Intravenous Continuous Cammie Sickle, MD 10 mL/hr at 01/22/21 0854 New Bag at 01/22/21 0854   bevacizumab-awwb (MVASI) 800 mg in sodium chloride 0.9 % 100 mL chemo  infusion  10 mg/kg (Treatment Plan Recorded) Intravenous Q14 Days Cammie Sickle, MD 264 mL/hr at 01/22/21 0937 800 mg at 01/22/21 0937   dextrose 5 % solution   Intravenous Once Cammie Sickle, MD       fluorouracil (ADRUCIL) 4,650 mg in sodium chloride 0.9 % 57 mL chemo infusion  2,400 mg/m2 (Treatment Plan Recorded) Intravenous 1 day or 1 dose Cammie Sickle, MD       heparin lock  flush 100 unit/mL  500 Units Intracatheter Once PRN Cammie Sickle, MD       oxaliplatin (ELOXATIN) 135 mg in dextrose 5 % 500 mL chemo infusion  70 mg/m2 (Treatment Plan Recorded) Intravenous Once Charlaine Dalton R, MD       sodium chloride flush (NS) 0.9 % injection 10 mL  10 mL Intravenous PRN Charlaine Dalton R, MD   10 mL at 10/09/20 0855   sodium chloride flush (NS) 0.9 % injection 10 mL  10 mL Intracatheter PRN Cammie Sickle, MD          .  PHYSICAL EXAMINATION: ECOG PERFORMANCE STATUS: 0 - Asymptomatic  Vitals:   01/22/21 0826  BP: (!) 153/96  Pulse: 63  Temp: (!) 95.6 F (35.3 C)  SpO2: 97%    Filed Weights   01/22/21 0826  Weight: 168 lb 9.6 oz (76.5 kg)     Physical Exam Vitals and nursing note reviewed.  HENT:     Head: Normocephalic and atraumatic.     Mouth/Throat:     Pharynx: Oropharynx is clear.  Eyes:     Extraocular Movements: Extraocular movements intact.     Pupils: Pupils are equal, round, and reactive to light.  Cardiovascular:     Rate and Rhythm: Normal rate and regular rhythm.  Pulmonary:     Comments: Decreased breath sounds bilaterally.  Abdominal:     Palpations: Abdomen is soft.  Musculoskeletal:        General: Normal range of motion.     Cervical back: Normal range of motion.  Skin:    General: Skin is warm.  Neurological:     General: No focal deficit present.     Mental Status: He is alert and oriented to person, place, and time.  Psychiatric:        Behavior: Behavior normal.        Judgment: Judgment  normal.     LABORATORY DATA:  I have reviewed the data as listed Lab Results  Component Value Date   WBC 6.1 01/22/2021   HGB 13.7 01/22/2021   HCT 43.1 01/22/2021   MCV 96.9 01/22/2021   PLT 103 (L) 01/22/2021   Recent Labs    12/19/20 0815 01/09/21 0814 01/22/21 0759  NA 138 137 135  K 4.2 4.0 4.1  CL 104 105 107  CO2 _0 GLUCOSE 99 109* 127*  BUN _1 CREATININE 1.35* 1.47* 1.48*  CALCIUM 8.7* 8.8* 8.7*  GFRNONAA 54* 49* 48*  PROT 6.5 6.6 6.7  ALBUMIN 3.5 3.6 3.5  AST 40 46* 36  ALT 33 43 37  ALKPHOS 142* 163* 177*  BILITOT 0.2* 0.5 0.7    RADIOGRAPHIC STUDIES: I have personally reviewed the radiological images as listed and agreed with the findings in the report. CT CHEST ABDOMEN PELVIS W CONTRAST  Result Date: 12/28/2020 CLINICAL DATA:  Follow-up colorectal carcinoma. Undergoing chemotherapy. EXAM: CT CHEST, ABDOMEN, AND PELVIS WITH CONTRAST TECHNIQUE: Multidetector CT imaging of the chest, abdomen and pelvis was performed following the standard protocol during bolus administration of intravenous contrast. CONTRAST:  161m OMNIPAQUE IOHEXOL 300 MG/ML  SOLN COMPARISON:  PET-CT on 09/21/2020 FINDINGS: CT CHEST FINDINGS Cardiovascular: No acute findings. Aortic and coronary atherosclerotic calcification noted. Mediastinum/Lymph Nodes: No masses or pathologically enlarged lymph nodes identified. Tiny hiatal hernia is seen, with mild diffuse esophageal wall thickening, consistent with esophagitis. Lungs/Pleura: Moderate centrilobular emphysema again demonstrated. Small calcified granuloma in the lateral right midlung remain  stable as well as other tiny sub-cm nodules in the right anterior mid lung. No suspicious pulmonary nodules or masses identified. No evidence of infiltrate or pleural effusion. Musculoskeletal:  No suspicious bone lesions identified. CT ABDOMEN AND PELVIS FINDINGS Hepatobiliary: No masses identified. Gallbladder is unremarkable. No evidence of  biliary ductal dilatation. Pancreas:  No mass or inflammatory changes. Spleen:  Within normal limits in size and appearance. Adrenals/Urinary tract: Subcapsular cyst seen in anterior lower pole of right kidney measuring 1.3 cm, and is stable. Another small cyst is seen in posterior midpole of right kidney. No masses or hydronephrosis. Stomach/Bowel: Tiny hiatal hernia noted. No evidence of obstruction, inflammatory process, or abnormal fluid collections. Normal appendix visualized. Vascular/Lymphatic: No pathologically enlarged lymph nodes identified. No acute vascular findings. Aortic atherosclerotic calcification noted. Reproductive:  No mass or other significant abnormality identified. Other: A soft tissue nodule in the lower abdominal omental fat near the midline measures 1.5 x 1.2 cm on image 82/2, compared to 2.9 x 2.3 cm on prior study. No other new or increased masses are identified. Musculoskeletal:  No suspicious bone lesions identified. IMPRESSION: Decreased size of abdominal omental soft tissue nodule since prior exam. No new or progressive metastatic disease within the chest, abdomen, or pelvis. Tiny hiatal hernia and findings of esophagitis. Aortic Atherosclerosis (ICD10-I70.0) and Emphysema (ICD10-J43.9). Electronically Signed   By: Marlaine Hind M.D.   On: 12/28/2020 13:52    ASSESSMENT & PLAN:   Rectal cancer ([place])  # STAGE IV- Synchronous primaries a] rectal cancer-10cm-adenocarcinoma & b] transverse colon-- intramucosal adenoca]; abdominal lymphadenopathy; liver metastases; omental metastases.  S/p 6 cycles of FOLFOX plus Bev-CT scan December 27, 2020-CT scan shows partial response with improvement of the omental lesion no progressive disease. STABLE   # proceed with FOLFOX #7  today; continue BEV  Labs today reviewed;  acceptable for treatment today.   # Anemia/iron deficiency hemoglobin ~13-  secondary to chronic GI bleeding/tumor.STABLE  #Thrombocytopenia/secondary chemotherapy/on  Plavix-aspirin-platelets-103- [decreased oxaliplatin dose by 20% with #6 cycle]-  STABLE  # HTN- 153/96; at home BP reviewed- 130/ DBP- 80s.continue checking BP at home/bring log since being on avastin. Proceed with avastin today.   # Chronic kidney disease stage III-[creat 1.4] - STABLE.    # Vaccination: s/p Flu shot.  # DISPOSITION:   # FOLFOX +Bev.  # Follow up in 2 weeks- MD: labs- cbc/cmp; CEA;  FOLFOX+ bev; Pump off in 2 days; -Dr.B      All questions were answered. The patient knows to call the clinic with any problems, questions or concerns.    Cammie Sickle, MD 01/22/2021 9:45 AM

## 2021-01-22 NOTE — Assessment & Plan Note (Addendum)
#   STAGE IV- Synchronous primaries a] rectal cancer-10cm-adenocarcinoma & b] transverse colon-- intramucosal adenoca]; abdominal lymphadenopathy; liver metastases; omental metastases.  S/p 6 cycles of FOLFOX plus Bev-CT scan December 27, 2020-CT scan shows partial response with improvement of the omental lesion no progressive disease. STABLE  # proceed with FOLFOX #7  today; continue BEV  Labs today reviewed;  acceptable for treatment today.   # Anemia/iron deficiency hemoglobin ~13-  secondary to chronic GI bleeding/tumor.STABLE  #Thrombocytopenia/secondary chemotherapy/on Plavix-aspirin-platelets-103- [decreased oxaliplatin dose by 20% with #6 cycle]-  STABLE  # HTN- 153/96; at home BP reviewed- 130/ DBP- 80s.continue checking BP at home/bring log since being on avastin. Proceed with avastin today.   # Chronic kidney disease stage III-[creat 1.4] - STABLE.   # Vaccination: s/p Flu shot.  # DISPOSITION:   # FOLFOX +Bev.  # Follow up in 2 weeks- MD: labs- cbc/cmp; CEA;  FOLFOX+ bev; Pump off in 2 days; -Dr.B

## 2021-01-22 NOTE — Patient Instructions (Signed)
[facility]CER CTR AT [place]  Discharge Instructions: Thank you for choosing [facility] to provide your oncology and hematology care.  If you have a lab appointment with the [facility], please go directly to the [facility] and check in at the registration area.  Wear comfortable clothing and clothing appropriate for easy access to any Portacath or PICC line.   We strive to give you quality time with your provider. You may need to reschedule your appointment if you arrive late (15 or more minutes).  Arriving late affects you and other patients whose appointments are after yours.  Also, if you miss three or more appointments without notifying the office, you may be dismissed from the clinic at the providers discretion.      For prescription refill requests, have your pharmacy contact our office and allow 72 hours for refills to be completed.    Today you received the following chemotherapy and/or immunotherapy agents: FOLFOX, Mvasi      To help prevent nausea and vomiting after your treatment, we encourage you to take your nausea medication as directed.  BELOW ARE SYMPTOMS THAT SHOULD BE REPORTED IMMEDIATELY: *FEVER GREATER THAN 100.4 F (38 C) OR HIGHER *CHILLS OR SWEATING *NAUSEA AND VOMITING THAT IS NOT CONTROLLED WITH YOUR NAUSEA MEDICATION *UNUSUAL SHORTNESS OF BREATH *UNUSUAL BRUISING OR BLEEDING *URINARY PROBLEMS (pain or burning when urinating, or frequent urination) *BOWEL PROBLEMS (unusual diarrhea, constipation, pain near the anus) TENDERNESS IN MOUTH AND THROAT WITH OR WITHOUT PRESENCE OF ULCERS (sore throat, sores in mouth, or a toothache) UNUSUAL RASH, SWELLING OR PAIN  UNUSUAL VAGINAL DISCHARGE OR ITCHING   Items with * indicate a potential emergency and should be followed up as soon as possible or go to the Emergency Department if any problems should occur.  Please show the CHEMOTHERAPY ALERT CARD or IMMUNOTHERAPY ALERT CARD at check-in  to the Emergency Department and triage nurse.  Should you have questions after your visit or need to cancel or reschedule your appointment, please contact [facility]CER [place] AT [place]  [phone] and follow the prompts.  Office hours are 8:00 a.m. to 4:30 p.m. Monday - Friday. Please note that voicemails left after 4:00 p.m. may not be returned until the following business day.  We are closed weekends and major holidays. You have access to a nurse at all times for urgent questions. Please call the main number to the clinic [phone] and follow the prompts.  For any non-urgent questions, you may also contact your provider using MyChart. We now offer e-Visits for anyone 65 and older to request care online for non-urgent symptoms. For details visit [email or web address].   Also download the MyChart app! Go to the app store, search "MyChart", open the app, select [facility], and log in with your MyChart username and password.  Due to Covid, a mask is required upon entering the hospital/clinic. If you do not have a mask, one will be given to you upon arrival. For doctor visits, patients may have 1 support person aged 32 or older with them. For treatment visits, patients cannot have anyone with them due to current Covid guidelines and our immunocompromised population. Fluorouracil, 5-FU injection What is this medication? FLUOROURACIL, 5-FU (flure oh YOOR a sil) is a chemotherapy drug. It slows the growth of cancer cells. This medicine is used to treat many types of cancer like breast cancer, colon or rectal cancer, pancreatic cancer, and stomach cancer. This medicine may be used for other purposes; ask  your health care provider or pharmacist if you have questions. COMMON BRAND NAME(S): Adrucil What should I tell my care team before I take this medication? They need to know if you have any of these conditions: blood disorders dihydropyrimidine dehydrogenase (DPD)  deficiency infection (especially a virus infection such as chickenpox, cold sores, or herpes) kidney disease liver disease malnourished, poor nutrition recent or ongoing radiation therapy an unusual or allergic reaction to fluorouracil, other chemotherapy, other medicines, foods, dyes, or preservatives pregnant or trying to get pregnant breast-feeding How should I use this medication? This drug is given as an infusion or injection into a vein. It is administered in a hospital or clinic by a specially trained health care professional. Talk to your pediatrician regarding the use of this medicine in children. Special care may be needed. Overdosage: If you think you have taken too much of this medicine contact a poison control center or emergency room at once. NOTE: This medicine is only for you. Do not share this medicine with others. What if I miss a dose? It is important not to miss your dose. Call your doctor or health care professional if you are unable to keep an appointment. What may interact with this medication? Do not take this medicine with any of the following medications: live virus vaccines This medicine may also interact with the following medications: medicines that treat or prevent blood clots like warfarin, enoxaparin, and dalteparin This list may not describe all possible interactions. Give your health care provider a list of all the medicines, herbs, non-prescription drugs, or dietary supplements you use. Also tell them if you smoke, drink alcohol, or use illegal drugs. Some items may interact with your medicine. What should I watch for while using this medication? Visit your doctor for checks on your progress. This drug may make you feel generally unwell. This is not uncommon, as chemotherapy can affect healthy cells as well as cancer cells. Report any side effects. Continue your course of treatment even though you feel ill unless your doctor tells you to stop. In some cases,  you may be given additional medicines to help with side effects. Follow all directions for their use. Call your doctor or health care professional for advice if you get a fever, chills or sore throat, or other symptoms of a cold or flu. Do not treat yourself. This drug decreases your body's ability to fight infections. Try to avoid being around people who are sick. This medicine may increase your risk to bruise or bleed. Call your doctor or health care professional if you notice any unusual bleeding. Be careful brushing and flossing your teeth or using a toothpick because you may get an infection or bleed more easily. If you have any dental work done, tell your dentist you are receiving this medicine. Avoid taking products that contain aspirin, acetaminophen, ibuprofen, naproxen, or ketoprofen unless instructed by your doctor. These medicines may hide a fever. Do not become pregnant while taking this medicine. Women should inform their doctor if they wish to become pregnant or think they might be pregnant. There is a potential for serious side effects to an unborn child. Talk to your health care professional or pharmacist for more information. Do not breast-feed an infant while taking this medicine. Men should inform their doctor if they wish to father a child. This medicine may lower sperm counts. Do not treat diarrhea with over the counter products. Contact your doctor if you have diarrhea that lasts more than 2 days  or if it is severe and watery. This medicine can make you more sensitive to the sun. Keep out of the sun. If you cannot avoid being in the sun, wear protective clothing and use sunscreen. Do not use sun lamps or tanning beds/booths. What side effects may I notice from receiving this medication? Side effects that you should report to your doctor or health care professional as soon as possible: allergic reactions like skin rash, itching or hives, swelling of the face, lips, or tongue low  blood counts - this medicine may decrease the number of white blood cells, red blood cells and platelets. You may be at increased risk for infections and bleeding. signs of infection - fever or chills, cough, sore throat, pain or difficulty passing urine signs of decreased platelets or bleeding - bruising, pinpoint red spots on the skin, black, tarry stools, blood in the urine signs of decreased red blood cells - unusually weak or tired, fainting spells, lightheadedness breathing problems changes in vision chest pain mouth sores nausea and vomiting pain, swelling, redness at site where injected pain, tingling, numbness in the hands or feet redness, swelling, or sores on hands or feet stomach pain unusual bleeding Side effects that usually do not require medical attention (report to your doctor or health care professional if they continue or are bothersome): changes in finger or toe nails diarrhea dry or itchy skin hair loss headache loss of appetite sensitivity of eyes to the light stomach upset unusually teary eyes This list may not describe all possible side effects. Call your doctor for medical advice about side effects. You may report side effects to FDA at 1-800-FDA-1088. Where should I keep my medication? This drug is given in a hospital or clinic and will not be stored at home. NOTE: This sheet is a summary. It may not cover all possible information. If you have questions about this medicine, talk to your doctor, pharmacist, or health care provider.  2022 Elsevier/Gold Standard (2020-09-19 00:00:00) Oxaliplatin Injection What is this medication? OXALIPLATIN (ox AL i PLA tin) is a chemotherapy drug. It targets fast dividing cells, like cancer cells, and causes these cells to die. This medicine is used to treat cancers of the colon and rectum, and many other cancers. This medicine may be used for other purposes; ask your health care provider or pharmacist if you have  questions. COMMON BRAND NAME(S): Eloxatin What should I tell my care team before I take this medication? They need to know if you have any of these conditions: heart disease history of irregular heartbeat liver disease low blood counts, like white cells, platelets, or red blood cells lung or breathing disease, like asthma take medicines that treat or prevent blood clots tingling of the fingers or toes, or other nerve disorder an unusual or allergic reaction to oxaliplatin, other chemotherapy, other medicines, foods, dyes, or preservatives pregnant or trying to get pregnant breast-feeding How should I use this medication? This drug is given as an infusion into a vein. It is administered in a hospital or clinic by a specially trained health care professional. Talk to your pediatrician regarding the use of this medicine in children. Special care may be needed. Overdosage: If you think you have taken too much of this medicine contact a poison control center or emergency room at once. NOTE: This medicine is only for you. Do not share this medicine with others. What if I miss a dose? It is important not to miss a dose. Call your doctor or  health care professional if you are unable to keep an appointment. What may interact with this medication? Do not take this medicine with any of the following medications: cisapride dronedarone pimozide thioridazine This medicine may also interact with the following medications: aspirin and aspirin-like medicines certain medicines that treat or prevent blood clots like warfarin, apixaban, dabigatran, and rivaroxaban cisplatin cyclosporine diuretics medicines for infection like acyclovir, adefovir, amphotericin B, bacitracin, cidofovir, foscarnet, ganciclovir, gentamicin, pentamidine, vancomycin NSAIDs, medicines for pain and inflammation, like ibuprofen or naproxen other medicines that prolong the QT interval (an abnormal heart  rhythm) pamidronate zoledronic acid This list may not describe all possible interactions. Give your health care provider a list of all the medicines, herbs, non-prescription drugs, or dietary supplements you use. Also tell them if you smoke, drink alcohol, or use illegal drugs. Some items may interact with your medicine. What should I watch for while using this medication? Your condition will be monitored carefully while you are receiving this medicine. You may need blood work done while you are taking this medicine. This medicine may make you feel generally unwell. This is not uncommon as chemotherapy can affect healthy cells as well as cancer cells. Report any side effects. Continue your course of treatment even though you feel ill unless your healthcare professional tells you to stop. This medicine can make you more sensitive to cold. Do not drink cold drinks or use ice. Cover exposed skin before coming in contact with cold temperatures or cold objects. When out in cold weather wear warm clothing and cover your mouth and nose to warm the air that goes into your lungs. Tell your doctor if you get sensitive to the cold. Do not become pregnant while taking this medicine or for 9 months after stopping it. Women should inform their health care professional if they wish to become pregnant or think they might be pregnant. Men should not father a child while taking this medicine and for 6 months after stopping it. There is potential for serious side effects to an unborn child. Talk to your health care professional for more information. Do not breast-feed a child while taking this medicine or for 3 months after stopping it. This medicine has caused ovarian failure in some women. This medicine may make it more difficult to get pregnant. Talk to your health care professional if you are concerned about your fertility. This medicine has caused decreased sperm counts in some men. This may make it more difficult to  father a child. Talk to your health care professional if you are concerned about your fertility. This medicine may increase your risk of getting an infection. Call your health care professional for advice if you get a fever, chills, or sore throat, or other symptoms of a cold or flu. Do not treat yourself. Try to avoid being around people who are sick. Avoid taking medicines that contain aspirin, acetaminophen, ibuprofen, naproxen, or ketoprofen unless instructed by your health care professional. These medicines may hide a fever. Be careful brushing or flossing your teeth or using a toothpick because you may get an infection or bleed more easily. If you have any dental work done, tell your dentist you are receiving this medicine. What side effects may I notice from receiving this medication? Side effects that you should report to your doctor or health care professional as soon as possible: allergic reactions like skin rash, itching or hives, swelling of the face, lips, or tongue breathing problems cough low blood counts - this medicine  may decrease the number of white blood cells, red blood cells, and platelets. You may be at increased risk for infections and bleeding nausea, vomiting pain, redness, or irritation at site where injected pain, tingling, numbness in the hands or feet signs and symptoms of bleeding such as bloody or black, tarry stools; red or dark brown urine; spitting up blood or brown material that looks like coffee grounds; red spots on the skin; unusual bruising or bleeding from the eyes, gums, or nose signs and symptoms of a dangerous change in heartbeat or heart rhythm like chest pain; dizziness; fast, irregular heartbeat; palpitations; feeling faint or lightheaded; falls signs and symptoms of infection like fever; chills; cough; sore throat; pain or trouble passing urine signs and symptoms of liver injury like dark yellow or brown urine; general ill feeling or flu-like symptoms;  light-colored stools; loss of appetite; nausea; right upper belly pain; unusually weak or tired; yellowing of the eyes or skin signs and symptoms of low red blood cells or anemia such as unusually weak or tired; feeling faint or lightheaded; falls signs and symptoms of muscle injury like dark urine; trouble passing urine or change in the amount of urine; unusually weak or tired; muscle pain; back pain Side effects that usually do not require medical attention (report to your doctor or health care professional if they continue or are bothersome): changes in taste diarrhea gas hair loss loss of appetite mouth sores This list may not describe all possible side effects. Call your doctor for medical advice about side effects. You may report side effects to FDA at 1-800-FDA-1088. Where should I keep my medication? This drug is given in a hospital or clinic and will not be stored at home. NOTE: This sheet is a summary. It may not cover all possible information. If you have questions about this medicine, talk to your doctor, pharmacist, or health care provider.  2022 Elsevier/Gold Standard (2020-09-19 00:00:00) Bevacizumab injection What is this medication? BEVACIZUMAB (be va SIZ yoo mab) is a monoclonal antibody. It is used to treat many types of cancer. This medicine may be used for other purposes; ask your health care provider or pharmacist if you have questions. COMMON BRAND NAME(S): Alymsys, Avastin, MVASI, Noah Charon What should I tell my care team before I take this medication? They need to know if you have any of these conditions: diabetes heart disease high blood pressure history of coughing up blood prior anthracycline chemotherapy (e.g., doxorubicin, daunorubicin, epirubicin) recent or ongoing radiation therapy recent or planning to have surgery stroke an unusual or allergic reaction to bevacizumab, hamster proteins, mouse proteins, other medicines, foods, dyes, or  preservatives pregnant or trying to get pregnant breast-feeding How should I use this medication? This medicine is for infusion into a vein. It is given by a health care professional in a hospital or clinic setting. Talk to your pediatrician regarding the use of this medicine in children. Special care may be needed. Overdosage: If you think you have taken too much of this medicine contact a poison control center or emergency room at once. NOTE: This medicine is only for you. Do not share this medicine with others. What if I miss a dose? It is important not to miss your dose. Call your doctor or health care professional if you are unable to keep an appointment. What may interact with this medication? Interactions are not expected. This list may not describe all possible interactions. Give your health care provider a list of all the medicines, herbs,  non-prescription drugs, or dietary supplements you use. Also tell them if you smoke, drink alcohol, or use illegal drugs. Some items may interact with your medicine. What should I watch for while using this medication? Your condition will be monitored carefully while you are receiving this medicine. You will need important blood work and urine testing done while you are taking this medicine. This medicine may increase your risk to bruise or bleed. Call your doctor or health care professional if you notice any unusual bleeding. Before having surgery, talk to your health care provider to make sure it is ok. This drug can increase the risk of poor healing of your surgical site or wound. You will need to stop this drug for 28 days before surgery. After surgery, wait at least 28 days before restarting this drug. Make sure the surgical site or wound is healed enough before restarting this drug. Talk to your health care provider if questions. Do not become pregnant while taking this medicine or for 6 months after stopping it. Women should inform their doctor if  they wish to become pregnant or think they might be pregnant. There is a potential for serious side effects to an unborn child. Talk to your health care professional or pharmacist for more information. Do not breast-feed an infant while taking this medicine and for 6 months after the last dose. This medicine has caused ovarian failure in some women. This medicine may interfere with the ability to have a child. You should talk to your doctor or health care professional if you are concerned about your fertility. What side effects may I notice from receiving this medication? Side effects that you should report to your doctor or health care professional as soon as possible: allergic reactions like skin rash, itching or hives, swelling of the face, lips, or tongue chest pain or chest tightness chills coughing up blood high fever seizures severe constipation signs and symptoms of bleeding such as bloody or black, tarry stools; red or dark-brown urine; spitting up blood or brown material that looks like coffee grounds; red spots on the skin; unusual bruising or bleeding from the eye, gums, or nose signs and symptoms of a blood clot such as breathing problems; chest pain; severe, sudden headache; pain, swelling, warmth in the leg signs and symptoms of a stroke like changes in vision; confusion; trouble speaking or understanding; severe headaches; sudden numbness or weakness of the face, arm or leg; trouble walking; dizziness; loss of balance or coordination stomach pain sweating swelling of legs or ankles vomiting weight gain Side effects that usually do not require medical attention (report to your doctor or health care professional if they continue or are bothersome): back pain changes in taste decreased appetite dry skin nausea tiredness This list may not describe all possible side effects. Call your doctor for medical advice about side effects. You may report side effects to FDA at  1-800-FDA-1088. Where should I keep my medication? This drug is given in a hospital or clinic and will not be stored at home. NOTE: This sheet is a summary. It may not cover all possible information. If you have questions about this medicine, talk to your doctor, pharmacist, or health care provider.  2022 Elsevier/Gold Standard (2020-09-19 00:00:00)

## 2021-01-24 ENCOUNTER — Inpatient Hospital Stay: Payer: Medicare HMO

## 2021-01-24 ENCOUNTER — Other Ambulatory Visit: Payer: Self-pay

## 2021-01-24 VITALS — BP 155/86 | HR 68 | Temp 96.0°F | Resp 18

## 2021-01-24 DIAGNOSIS — Z5111 Encounter for antineoplastic chemotherapy: Secondary | ICD-10-CM | POA: Diagnosis not present

## 2021-01-24 DIAGNOSIS — I129 Hypertensive chronic kidney disease with stage 1 through stage 4 chronic kidney disease, or unspecified chronic kidney disease: Secondary | ICD-10-CM | POA: Diagnosis not present

## 2021-01-24 DIAGNOSIS — N183 Chronic kidney disease, stage 3 unspecified: Secondary | ICD-10-CM | POA: Diagnosis not present

## 2021-01-24 DIAGNOSIS — C20 Malignant neoplasm of rectum: Secondary | ICD-10-CM | POA: Diagnosis not present

## 2021-01-24 DIAGNOSIS — C786 Secondary malignant neoplasm of retroperitoneum and peritoneum: Secondary | ICD-10-CM | POA: Diagnosis not present

## 2021-01-24 DIAGNOSIS — D509 Iron deficiency anemia, unspecified: Secondary | ICD-10-CM | POA: Diagnosis not present

## 2021-01-24 DIAGNOSIS — Z79899 Other long term (current) drug therapy: Secondary | ICD-10-CM | POA: Diagnosis not present

## 2021-01-24 DIAGNOSIS — C787 Secondary malignant neoplasm of liver and intrahepatic bile duct: Secondary | ICD-10-CM | POA: Diagnosis not present

## 2021-01-24 MED ORDER — HEPARIN SOD (PORK) LOCK FLUSH 100 UNIT/ML IV SOLN
500.0000 [IU] | Freq: Once | INTRAVENOUS | Status: AC | PRN
  Administered 2021-01-24: 500 [IU]
  Filled 2021-01-24: qty 5

## 2021-01-25 DIAGNOSIS — C20 Malignant neoplasm of rectum: Secondary | ICD-10-CM | POA: Diagnosis not present

## 2021-02-05 ENCOUNTER — Inpatient Hospital Stay (HOSPITAL_BASED_OUTPATIENT_CLINIC_OR_DEPARTMENT_OTHER): Payer: Medicare HMO | Admitting: Internal Medicine

## 2021-02-05 ENCOUNTER — Encounter: Payer: Self-pay | Admitting: Internal Medicine

## 2021-02-05 ENCOUNTER — Inpatient Hospital Stay: Payer: Medicare HMO

## 2021-02-05 ENCOUNTER — Other Ambulatory Visit: Payer: Self-pay

## 2021-02-05 DIAGNOSIS — C20 Malignant neoplasm of rectum: Secondary | ICD-10-CM | POA: Diagnosis not present

## 2021-02-05 DIAGNOSIS — C787 Secondary malignant neoplasm of liver and intrahepatic bile duct: Secondary | ICD-10-CM | POA: Diagnosis not present

## 2021-02-05 DIAGNOSIS — Z5111 Encounter for antineoplastic chemotherapy: Secondary | ICD-10-CM | POA: Diagnosis not present

## 2021-02-05 DIAGNOSIS — I129 Hypertensive chronic kidney disease with stage 1 through stage 4 chronic kidney disease, or unspecified chronic kidney disease: Secondary | ICD-10-CM | POA: Diagnosis not present

## 2021-02-05 DIAGNOSIS — Z79899 Other long term (current) drug therapy: Secondary | ICD-10-CM | POA: Diagnosis not present

## 2021-02-05 DIAGNOSIS — C786 Secondary malignant neoplasm of retroperitoneum and peritoneum: Secondary | ICD-10-CM | POA: Diagnosis not present

## 2021-02-05 DIAGNOSIS — D509 Iron deficiency anemia, unspecified: Secondary | ICD-10-CM | POA: Diagnosis not present

## 2021-02-05 DIAGNOSIS — N183 Chronic kidney disease, stage 3 unspecified: Secondary | ICD-10-CM | POA: Diagnosis not present

## 2021-02-05 LAB — CBC WITH DIFFERENTIAL/PLATELET
Abs Immature Granulocytes: 0.01 10*3/uL (ref 0.00–0.07)
Basophils Absolute: 0.1 10*3/uL (ref 0.0–0.1)
Basophils Relative: 1 %
Eosinophils Absolute: 0.3 10*3/uL (ref 0.0–0.5)
Eosinophils Relative: 6 %
HCT: 43 % (ref 39.0–52.0)
Hemoglobin: 13.6 g/dL (ref 13.0–17.0)
Immature Granulocytes: 0 %
Lymphocytes Relative: 24 %
Lymphs Abs: 1.3 10*3/uL (ref 0.7–4.0)
MCH: 31.1 pg (ref 26.0–34.0)
MCHC: 31.6 g/dL (ref 30.0–36.0)
MCV: 98.4 fL (ref 80.0–100.0)
Monocytes Absolute: 0.8 10*3/uL (ref 0.1–1.0)
Monocytes Relative: 15 %
Neutro Abs: 2.9 10*3/uL (ref 1.7–7.7)
Neutrophils Relative %: 54 %
Platelets: 91 10*3/uL — ABNORMAL LOW (ref 150–400)
RBC: 4.37 MIL/uL (ref 4.22–5.81)
RDW: 17 % — ABNORMAL HIGH (ref 11.5–15.5)
WBC: 5.4 10*3/uL (ref 4.0–10.5)
nRBC: 0 % (ref 0.0–0.2)

## 2021-02-05 LAB — URINALYSIS, COMPLETE (UACMP) WITH MICROSCOPIC
Bacteria, UA: NONE SEEN
Bilirubin Urine: NEGATIVE
Glucose, UA: NEGATIVE mg/dL
Hgb urine dipstick: NEGATIVE
Ketones, ur: NEGATIVE mg/dL
Leukocytes,Ua: NEGATIVE
Nitrite: NEGATIVE
Protein, ur: NEGATIVE mg/dL
Specific Gravity, Urine: 1.02 (ref 1.005–1.030)
Squamous Epithelial / HPF: NONE SEEN (ref 0–5)
pH: 5 (ref 5.0–8.0)

## 2021-02-05 LAB — COMPREHENSIVE METABOLIC PANEL WITH GFR
ALT: 37 U/L (ref 0–44)
AST: 35 U/L (ref 15–41)
Albumin: 3.6 g/dL (ref 3.5–5.0)
Alkaline Phosphatase: 182 U/L — ABNORMAL HIGH (ref 38–126)
Anion gap: 10 (ref 5–15)
BUN: 16 mg/dL (ref 8–23)
CO2: 22 mmol/L (ref 22–32)
Calcium: 8.9 mg/dL (ref 8.9–10.3)
Chloride: 107 mmol/L (ref 98–111)
Creatinine, Ser: 1.45 mg/dL — ABNORMAL HIGH (ref 0.61–1.24)
GFR, Estimated: 50 mL/min — ABNORMAL LOW (ref >60)
Glucose, Bld: 120 mg/dL — ABNORMAL HIGH (ref 70–99)
Potassium: 3.9 mmol/L (ref 3.5–5.1)
Sodium: 139 mmol/L (ref 135–145)
Total Bilirubin: 0.5 mg/dL (ref 0.3–1.2)
Total Protein: 6.5 g/dL (ref 6.5–8.1)

## 2021-02-05 MED ORDER — OXALIPLATIN CHEMO INJECTION 100 MG/20ML
70.0000 mg/m2 | Freq: Once | INTRAVENOUS | Status: AC
  Administered 2021-02-05: 135 mg via INTRAVENOUS
  Filled 2021-02-05: qty 20

## 2021-02-05 MED ORDER — SODIUM CHLORIDE 0.9 % IV SOLN
INTRAVENOUS | Status: DC
  Filled 2021-02-05: qty 250

## 2021-02-05 MED ORDER — DEXTROSE 5 % IV SOLN
Freq: Once | INTRAVENOUS | Status: AC
  Filled 2021-02-05: qty 250

## 2021-02-05 MED ORDER — SODIUM CHLORIDE 0.9 % IV SOLN
10.0000 mg/kg | INTRAVENOUS | Status: DC
  Administered 2021-02-05: 800 mg via INTRAVENOUS
  Filled 2021-02-05: qty 32

## 2021-02-05 MED ORDER — SODIUM CHLORIDE 0.9 % IV SOLN
2400.0000 mg/m2 | INTRAVENOUS | Status: DC
  Administered 2021-02-05: 4650 mg via INTRAVENOUS
  Filled 2021-02-05: qty 93

## 2021-02-05 MED ORDER — PALONOSETRON HCL INJECTION 0.25 MG/5ML
0.2500 mg | Freq: Once | INTRAVENOUS | Status: AC
  Administered 2021-02-05: 0.25 mg via INTRAVENOUS
  Filled 2021-02-05: qty 5

## 2021-02-05 MED ORDER — SODIUM CHLORIDE 0.9 % IV SOLN
10.0000 mg | Freq: Once | INTRAVENOUS | Status: AC
  Administered 2021-02-05: 10 mg via INTRAVENOUS
  Filled 2021-02-05: qty 10

## 2021-02-05 NOTE — Progress Notes (Signed)
Patient denies new problems/concerns today.  BP 166/96

## 2021-02-05 NOTE — Patient Instructions (Addendum)
[facility] CANCER CTR AT [place]  Discharge Instructions: Thank you for choosing [facility] to provide your oncology and hematology care.  If you have a lab appointment with the [facility], please go directly to the [facility] and check in at the registration area.  Wear comfortable clothing and clothing appropriate for easy access to any Portacath or PICC line.   We strive to give you quality time with your provider. You may need to reschedule your appointment if you arrive late (15 or more minutes).  Arriving late affects you and other patients whose appointments are after yours.  Also, if you miss three or more appointments without notifying the office, you may be dismissed from the clinic at the providers discretion.      For prescription refill requests, have your pharmacy contact our office and allow 72 hours for refills to be completed.    Today you received the following chemotherapy and/or immunotherapy agents Mvasi, oxaliplatin, fluorouracil   To help prevent nausea and vomiting after your treatment, we encourage you to take your nausea medication as directed.  BELOW ARE SYMPTOMS THAT SHOULD BE REPORTED IMMEDIATELY: *FEVER GREATER THAN 100.4 F (38 C) OR HIGHER *CHILLS OR SWEATING *NAUSEA AND VOMITING THAT IS NOT CONTROLLED WITH YOUR NAUSEA MEDICATION *UNUSUAL SHORTNESS OF BREATH *UNUSUAL BRUISING OR BLEEDING *URINARY PROBLEMS (pain or burning when urinating, or frequent urination) *BOWEL PROBLEMS (unusual diarrhea, constipation, pain near the anus) TENDERNESS IN MOUTH AND THROAT WITH OR WITHOUT PRESENCE OF ULCERS (sore throat, sores in mouth, or a toothache) UNUSUAL RASH, SWELLING OR PAIN  UNUSUAL VAGINAL DISCHARGE OR ITCHING   Items with * indicate a potential emergency and should be followed up as soon as possible or go to the Emergency Department if any problems should occur.  Please show the CHEMOTHERAPY ALERT CARD or IMMUNOTHERAPY ALERT  CARD at check-in to the Emergency Department and triage nurse.  Should you have questions after your visit or need to cancel or reschedule your appointment, please contact [facility] CANCER [place] AT [place]  [phone] and follow the prompts.  Office hours are 8:00 a.m. to 4:30 p.m. Monday - Friday. Please note that voicemails left after 4:00 p.m. may not be returned until the following business day.  We are closed weekends and major holidays. You have access to a nurse at all times for urgent questions. Please call the main number to the clinic [phone] and follow the prompts.  For any non-urgent questions, you may also contact your provider using MyChart. We now offer e-Visits for anyone 81 and older to request care online for non-urgent symptoms. For details visit [email or web address].   Also download the MyChart app! Go to the app store, search "MyChart", open the app, select [facility], and log in with your MyChart username and password.  Due to Covid, a mask is required upon entering the hospital/clinic. If you do not have a mask, one will be given to you upon arrival. For doctor visits, patients may have 1 support person aged 55 or older with them. For treatment visits, patients cannot have anyone with them due to current Covid guidelines and our immunocompromised population.

## 2021-02-05 NOTE — Assessment & Plan Note (Addendum)
#   STAGE IV- Synchronous primaries a] rectal cancer-10cm-adenocarcinoma & b] transverse colon-- intramucosal adenoca]; abdominal lymphadenopathy; liver metastases; omental metastases.  S/p 6 cycles of FOLFOX plus Bev-CT scan December 27, 2020-CT scan shows partial response with improvement of the omental lesion no progressive disease. STABLE  # proceed with FOLFOX #9  today; continue BEV [see below]  Labs today reviewed;  acceptable for treatment today.   # Anemia/iron deficiency hemoglobin ~13-  secondary to chronic GI bleeding/tumor.STABLE  #Thrombocytopenia/secondary chemotherapy/on Plavix-aspirin-platelets-91- [decreased oxaliplatin dose by 20% with #6 cycle]-  -slightly worsened/ mild nose bleeds-? Avastin. We will monitor for now; if worse would recommend discontinuation of oxaliplatin.   # HTN- 166/96; at home BP reviewed- 130/ DBP-60- 80s.continue checking BP at home/reviewed the log from home.  Proceed with avastin today. STABLE  # Chronic kidney disease stage III-[creat 1.4] - STABLE.   # DISPOSITION:   # FOLFOX +Bev.  # Follow up in 2 weeks- MD: labs- cbc/cmp; CEA;  FOLFOX+ bev; Pump off in 2 days; -Dr.B

## 2021-02-05 NOTE — Progress Notes (Signed)
Vitals and labs reviewed with MD and treatment team. Per MD to proceed with treatment.  Per MD to proceed with Mvasi without UA resulting at this time. Treatment team updated.  Kang Ishida [employer]

## 2021-02-05 NOTE — Progress Notes (Signed)
[facility] NOTE  Patient Care Team: [name], MD as PCP - General (Family Medicine) Clent Jacks, RN as Oncology Nurse Navigator  CHIEF COMPLAINTS/PURPOSE OF CONSULTATION: colon/rectal cancer  #  Oncology History Overview Note  # Malignant partially obstructing tumor in the transverse colon/70 cm proximal to the anus- C. COLON MASS, 70 CM; COLD BIOPSY:  - INTRAMUCOSAL ADENOCARCINOMA AT LEAST;  # One 20 mm polyp in the transverse colon, removed with mucosal resection. Resected and retrieved. Clips were placed. Tattooed.  # tumor in the mid rectum and at 10 cm proximal to the anus. Biopsied.      SEE COMMENT.   Comment:  There is no definitive evidence of invasion in this sample.  The  findings may not accurately represent the entire underlying lesion;  clinical correlation is recommended.   D. COLON POLYP, TRANSVERSE; HOT SNARE:  - TUBULOVILLOUS ADENOMA.  - NEGATIVE FOR HIGH GRADE DYSPLASIA AND MALIGNANCY.   E. RECTUM MASS; COLD BIOPSY:  - INVASIVE ADENOCARCINOMA, MODERATELY TO POORLY DIFFERENTIATED. ----------------------------------   # PET scan: SEP 2022-proximal right colon [adjacent mesenteric lymph nodes] and rectal hypermetabolism.  Additional subcentimeter bilateral hypermetabolic lymphadenopathy noted in the retroperitoneal/left external iliac; inferior right lobe of the liver concerning for metastatic disease; right lower quadrant soft tissue mass concerning for peritoneal carcinomatosis; bilateral solid pulmonary nodules ~5 mm; MRI rectum- T stage: T4a; N stage:  N1.   # 09/12-2020- FOLFOX chemo [Dr.White/Dr.Vanga]; ADDED BEV with cycle #3-DC 5-FU bolus+LV; # 6-oxaliplatin dose reduced by 20%[thrombocytopenia]   Rectal cancer ([place])  09/11/2020 Initial Diagnosis   Rectal cancer ([place])   09/25/2020 -  Chemotherapy   Patient is on Treatment Plan : COLORECTAL FOLFOX q14d x 4 months     09/28/2020 Cancer Staging   Staging form:  Colon and Rectum, AJCC 8th Edition - Clinical: Stage IVC (cT4a, cN1, cM1c) - Signed by Cammie Sickle, MD on 09/28/2020     Genetic Testing   Negative genetic testing. No pathogenic variants identified on the [facility] Multi-Cancer+RNA Panel. The report date is 11/05/2020.  The Multi-Cancer Panel + RNA offered by [facility] includes sequencing and/or deletion duplication testing of the following 84 genes: AIP, ALK, APC, ATM, AXIN2,BAP1,  BARD1, BLM, BMPR1A, BRCA1, BRCA2, BRIP1, CASR, CDC73, CDH1, CDK4, CDKN1B, CDKN1C, CDKN2A (p14ARF), CDKN2A (p16INK4a), CEBPA, CHEK2, CTNNA1, DICER1, DIS3L2, EGFR (c.2369C>T, p.Thr790Met variant only), EPCAM (Deletion/duplication testing only), FH, FLCN, GATA2, GPC3, GREM1 (Promoter region deletion/duplication testing only), HOXB13 (c.251G>A, p.Gly84Glu), HRAS, KIT, MAX, MEN1, MET, MITF (c.952G>A, p.Glu318Lys variant only), MLH1, MSH2, MSH3, MSH6, MUTYH, NBN, NF1, NF2, NTHL1, PALB2, PDGFRA, PHOX2B, PMS2, POLD1, POLE, POT1, PRKAR1A, PTCH1, PTEN, RAD50, RAD51C, RAD51D, RB1, RECQL4, RET, RUNX1, SDHAF2, SDHA (sequence changes only), SDHB, SDHC, SDHD, SMAD4, SMARCA4, SMARCB1, SMARCE1, STK11, SUFU, TERC, TERT, TMEM127, TP53, TSC1, TSC2, VHL, WRN and WT1.      HISTORY OF PRESENTING ILLNESS: Ambulating independently.  Alone.  Albert Giles 76 y.o.  male synchronous colon cancer/rectal cancer-stage IV-on FOLFOX chemotherapy is here for follow-up.  Patient denies any tingling or numbness.  Denies any nausea vomiting.  Denies abdominal pain.  Intermittent nosebleeds.  Otherwise no headaches.  Review of Systems  Constitutional:  Negative for chills, diaphoresis, fever and weight loss.  HENT:  Negative for nosebleeds and sore throat.   Eyes:  Negative for double vision.  Respiratory:  Negative for cough, hemoptysis, sputum production, shortness of breath and wheezing.   Cardiovascular:  Negative for chest pain, palpitations, orthopnea and leg swelling.  Gastrointestinal:   Negative for abdominal pain, blood in stool, constipation, diarrhea, heartburn, melena, nausea and vomiting.  Genitourinary:  Negative for dysuria, frequency and urgency.  Skin: Negative.  Negative for itching and rash.  Neurological:  Negative for dizziness, tingling, focal weakness, weakness and headaches.  Endo/Heme/Allergies:  Does not bruise/bleed easily.  Psychiatric/Behavioral:  Negative for depression. The patient is not nervous/anxious and does not have insomnia.     MEDICAL HISTORY:  Past Medical History:  Diagnosis Date   CAD (coronary artery disease) 06/2014   UA with NSTEMI - cath with 99% prox L circ s/p stent, EF 40% ([place] [place] [facility])   Emphysema    mild   Ex-smoker    Family history of breast cancer    Family history of colon cancer    Family history of pancreatic cancer    NSTEMI (non-ST elevated myocardial infarction) ([place]) 07/13/2014    SURGICAL HISTORY: Past Surgical History:  Procedure Laterality Date   CARDIAC CATHETERIZATION N/A 07/14/2014   Left Heart Cath and Coronary Angiography with stent placement;  Surgeon: Teodoro Spray, MD   CARDIAC CATHETERIZATION N/A 07/14/2014   Coronary Stent Intervention;  Surgeon: Yolonda Kida, MD   COLONOSCOPY WITH PROPOFOL N/A 09/06/2020   Procedure: COLONOSCOPY WITH PROPOFOL;  Surgeon: Lin Landsman, MD;  Location[facility] ENDOSCOPY;  Service: Gastroenterology;  Laterality: N/A;   CYSTECTOMY     on back   ESOPHAGOGASTRODUODENOSCOPY N/A 09/06/2020   Procedure: ESOPHAGOGASTRODUODENOSCOPY (EGD);  Surgeon: Lin Landsman, MD;  Location: [facility] ENDOSCOPY;  Service: Gastroenterology;  Laterality: N/A;   INGUINAL HERNIA REPAIR Right 08/17/04   IR IMAGING GUIDED PORT INSERTION  09/13/2020    SOCIAL HISTORY: Social History   Socioeconomic History   Marital status: Married    Spouse name: Not on file   Number of children: Not on file   Years of education: Not on file   Highest education level: Not on file   Occupational History   Not on file  Tobacco Use   Smoking status: Former    Packs/day: 1.00    Years: 35.00    Pack years: 35.00    Types: Cigarettes    Quit date: 07/07/1998    Years since quitting: 22.6   Smokeless tobacco: Never  Vaping Use   Vaping Use: Never used  Substance and Sexual Activity   Alcohol use: No   Drug use: No   Sexual activity: Yes  Other Topics Concern   Not on file  Social History Narrative   Caffeine: 2 cups coffee, 2 cups tea/day   Lives with wife   Occupation: Higher education careers adviser, retired   Edu: HS   Activity: works in yard, walking   Diet: some water, fruits/vegetables daily   -------------------------------------------------------------------       [employer] [place]; [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    Social Determinants of Health   Financial Resource Strain: Low Risk    Difficulty of Paying Living Expenses: Not hard at all  Food Insecurity: No Food Insecurity   Worried About Charity fundraiser in the Last Year: Never true   [place] in the Last Year: Never true  Transportation Needs: No Transportation Needs   Lack of Transportation (Medical): No   Lack of Transportation (Non-Medical): No  Physical Activity: Inactive   Days of Exercise per Week: 0 days   Minutes of Exercise per Session: 0 min  Stress:  No Stress Concern Present   Feeling of Stress : Not at all  Social Connections: Not on file  Intimate Partner Violence: Not At Risk   Fear of Current or Ex-Partner: No   Emotionally Abused: No   Physically Abused: No   Sexually Abused: No    FAMILY HISTORY: Family History  Problem Relation Age of Onset   Cancer Mother 63       colon   Cancer Sister        breast   Crohn's disease Sister    Cancer Daughter        anal   Crohn's disease Niece    CAD Neg Hx    Stroke Neg Hx    Diabetes Neg Hx    Prostate cancer Neg Hx    Kidney cancer Neg Hx    Bladder  Cancer Neg Hx     ALLERGIES:  has No Known Allergies.  MEDICATIONS:  Current Outpatient Medications  Medication Sig Dispense Refill   aspirin EC 81 MG tablet Take 1 tablet (81 mg total) by mouth daily. 60 tablet 1   atorvastatin (LIPITOR) 40 MG tablet Take 1 tablet (40 mg total) by mouth daily at 6 PM. 60 tablet 1   Cholecalciferol (VITAMIN D3) 25 MCG (1000 UT) CAPS Take 1 capsule (1,000 Units total) by mouth daily. 30 capsule    clopidogrel (PLAVIX) 75 MG tablet Take 1 tablet (75 mg total) by mouth daily. 60 tablet 1   Cyanocobalamin (B-12) 1000 MCG SUBL Place 1 tablet under the tongue daily.     ferrous sulfate 324 (65 Fe) MG TBEC Take 1 tablet (325 mg total) by mouth every other day.     lidocaine-prilocaine (EMLA) cream Apply 1 application topically as needed (to port a cath 1 hour prior to each chemotherapy). 30 g 3   metoprolol tartrate (LOPRESSOR) 25 MG tablet Take 1 tablet (25 mg total) by mouth 2 (two) times daily. 60 tablet 1   ondansetron (ZOFRAN) 8 MG tablet Take 1 tablet (8 mg total) by mouth every 8 (eight) hours as needed for nausea or vomiting. 20 tablet 3   prochlorperazine (COMPAZINE) 10 MG tablet Take 1 tablet (10 mg total) by mouth every 6 (six) hours as needed for nausea or vomiting. 30 tablet 3   tadalafil (CIALIS) 20 MG tablet TAKE ONE TABLET BY MOUTH DAILY AS NEEDED FOR ERECTILE DYSFUNCTION 20 tablet 3   pantoprazole (PROTONIX) 40 MG tablet Take 1 tablet (40 mg total) by mouth 2 (two) times daily before a meal. 180 tablet 1   No current facility-administered medications for this visit.   Facility-Administered Medications Ordered in Other Visits  Medication Dose Route Frequency Provider Last Rate Last Admin   heparin lock flush 100 unit/mL  500 Units Intracatheter Once PRN Charlaine Dalton R, MD       sodium chloride flush (NS) 0.9 % injection 10 mL  10 mL Intravenous PRN Charlaine Dalton R, MD   10 mL at 10/09/20 0855   sodium chloride flush (NS) 0.9 %  injection 10 mL  10 mL Intracatheter PRN Cammie Sickle, MD          .  PHYSICAL EXAMINATION: ECOG PERFORMANCE STATUS: 0 - Asymptomatic  Vitals:   02/05/21 0838  BP: (!) 166/96  Pulse: 66  Resp: 18  Temp: (!) 96.7 F (35.9 C)    Filed Weights   02/05/21 0838  Weight: 169 lb 9.6 oz (76.9 kg)     Physical Exam  Vitals and nursing note reviewed.  HENT:     Head: Normocephalic and atraumatic.     Mouth/Throat:     Pharynx: Oropharynx is clear.  Eyes:     Extraocular Movements: Extraocular movements intact.     Pupils: Pupils are equal, round, and reactive to light.  Cardiovascular:     Rate and Rhythm: Normal rate and regular rhythm.  Pulmonary:     Comments: Decreased breath sounds bilaterally.  Abdominal:     Palpations: Abdomen is soft.  Musculoskeletal:        General: Normal range of motion.     Cervical back: Normal range of motion.  Skin:    General: Skin is warm.  Neurological:     General: No focal deficit present.     Mental Status: He is alert and oriented to person, place, and time.  Psychiatric:        Behavior: Behavior normal.        Judgment: Judgment normal.     LABORATORY DATA:  I have reviewed the data as listed Lab Results  Component Value Date   WBC 5.4 02/05/2021   HGB 13.6 02/05/2021   HCT 43.0 02/05/2021   MCV 98.4 02/05/2021   PLT 91 (L) 02/05/2021   Recent Labs    01/09/21 0814 01/22/21 0759 02/05/21 0803  NA 137 135 139  K 4.0 4.1 3.9  CL 105 107 107  CO2 _0 GLUCOSE 109* 127* 120*  BUN _1 CREATININE 1.47* 1.48* 1.45*  CALCIUM 8.8* 8.7* 8.9  GFRNONAA 49* 48* 50*  PROT 6.6 6.7 6.5  ALBUMIN 3.6 3.5 3.6  AST 46* 36 35  ALT 43 37 37  ALKPHOS 163* 177* 182*  BILITOT 0.5 0.7 0.5    RADIOGRAPHIC STUDIES: I have personally reviewed the radiological images as listed and agreed with the findings in the report. No results found.  ASSESSMENT & PLAN:   Rectal cancer ([place])  # STAGE IV- Synchronous  primaries a] rectal cancer-10cm-adenocarcinoma & b] transverse colon-- intramucosal adenoca]; abdominal lymphadenopathy; liver metastases; omental metastases.  S/p 6 cycles of FOLFOX plus Bev-CT scan December 27, 2020-CT scan shows partial response with improvement of the omental lesion no progressive disease. STABLE   # proceed with FOLFOX #9  today; continue BEV [see below]  Labs today reviewed;  acceptable for treatment today.   # Anemia/iron deficiency hemoglobin ~13-  secondary to chronic GI bleeding/tumor.STABLE  #Thrombocytopenia/secondary chemotherapy/on Plavix-aspirin-platelets-91- [decreased oxaliplatin dose by 20% with #6 cycle]-  -slightly worsened/ mild nose bleeds-? Avastin. We will monitor for now; if worse would recommend discontinuation of oxaliplatin.   # HTN- 166/96; at home BP reviewed- 130/ DBP-60- 80s.continue checking BP at home/reviewed the log from home.  Proceed with avastin today. STABLE  # Chronic kidney disease stage III-[creat 1.4] - STABLE.    # DISPOSITION:   # FOLFOX +Bev.  # Follow up in 2 weeks- MD: labs- cbc/cmp; CEA;  FOLFOX+ bev; Pump off in 2 days; -Dr.B      All questions were answered. The patient knows to call the clinic with any problems, questions or concerns.    Cammie Sickle, MD 02/05/2021 8:55 AM

## 2021-02-06 LAB — CEA: CEA: 3.9 ng/mL (ref 0.0–4.7)

## 2021-02-07 ENCOUNTER — Inpatient Hospital Stay: Payer: Medicare HMO

## 2021-02-07 ENCOUNTER — Other Ambulatory Visit: Payer: Self-pay

## 2021-02-07 VITALS — BP 167/101 | HR 67 | Temp 96.3°F | Resp 18

## 2021-02-07 DIAGNOSIS — C20 Malignant neoplasm of rectum: Secondary | ICD-10-CM

## 2021-02-07 DIAGNOSIS — C786 Secondary malignant neoplasm of retroperitoneum and peritoneum: Secondary | ICD-10-CM | POA: Diagnosis not present

## 2021-02-07 DIAGNOSIS — Z79899 Other long term (current) drug therapy: Secondary | ICD-10-CM | POA: Diagnosis not present

## 2021-02-07 DIAGNOSIS — Z5111 Encounter for antineoplastic chemotherapy: Secondary | ICD-10-CM | POA: Diagnosis not present

## 2021-02-07 DIAGNOSIS — D509 Iron deficiency anemia, unspecified: Secondary | ICD-10-CM | POA: Diagnosis not present

## 2021-02-07 DIAGNOSIS — N183 Chronic kidney disease, stage 3 unspecified: Secondary | ICD-10-CM | POA: Diagnosis not present

## 2021-02-07 DIAGNOSIS — C787 Secondary malignant neoplasm of liver and intrahepatic bile duct: Secondary | ICD-10-CM | POA: Diagnosis not present

## 2021-02-07 DIAGNOSIS — I129 Hypertensive chronic kidney disease with stage 1 through stage 4 chronic kidney disease, or unspecified chronic kidney disease: Secondary | ICD-10-CM | POA: Diagnosis not present

## 2021-02-07 MED ORDER — SODIUM CHLORIDE 0.9% FLUSH
10.0000 mL | INTRAVENOUS | Status: DC | PRN
  Administered 2021-02-07 (×2): 10 mL
  Filled 2021-02-07: qty 10

## 2021-02-07 MED ORDER — HEPARIN SOD (PORK) LOCK FLUSH 100 UNIT/ML IV SOLN
500.0000 [IU] | Freq: Once | INTRAVENOUS | Status: AC | PRN
  Administered 2021-02-07: 13:00:00 500 [IU]
  Filled 2021-02-07: qty 5

## 2021-02-07 MED ORDER — HEPARIN SOD (PORK) LOCK FLUSH 100 UNIT/ML IV SOLN
INTRAVENOUS | Status: AC
  Filled 2021-02-07: qty 5

## 2021-02-07 NOTE — Progress Notes (Signed)
Pt. Here for d/c port.  Dr. Rogue Bussing aware of elevated BP.  Patient says he feels fine and has taken his BP meds.  MD gave ok for patient to go home.

## 2021-02-19 ENCOUNTER — Other Ambulatory Visit: Payer: Self-pay

## 2021-02-19 ENCOUNTER — Inpatient Hospital Stay: Payer: Medicare HMO | Attending: Internal Medicine

## 2021-02-19 ENCOUNTER — Inpatient Hospital Stay: Payer: Medicare HMO

## 2021-02-19 ENCOUNTER — Encounter: Payer: Self-pay | Admitting: Internal Medicine

## 2021-02-19 ENCOUNTER — Inpatient Hospital Stay (HOSPITAL_BASED_OUTPATIENT_CLINIC_OR_DEPARTMENT_OTHER): Payer: Medicare HMO | Admitting: Internal Medicine

## 2021-02-19 VITALS — BP 157/89 | HR 59 | Temp 96.8°F | Resp 18 | Wt 167.0 lb

## 2021-02-19 DIAGNOSIS — Z79899 Other long term (current) drug therapy: Secondary | ICD-10-CM | POA: Diagnosis not present

## 2021-02-19 DIAGNOSIS — C787 Secondary malignant neoplasm of liver and intrahepatic bile duct: Secondary | ICD-10-CM | POA: Insufficient documentation

## 2021-02-19 DIAGNOSIS — C20 Malignant neoplasm of rectum: Secondary | ICD-10-CM | POA: Diagnosis not present

## 2021-02-19 DIAGNOSIS — N183 Chronic kidney disease, stage 3 unspecified: Secondary | ICD-10-CM | POA: Diagnosis not present

## 2021-02-19 DIAGNOSIS — C786 Secondary malignant neoplasm of retroperitoneum and peritoneum: Secondary | ICD-10-CM | POA: Insufficient documentation

## 2021-02-19 DIAGNOSIS — D509 Iron deficiency anemia, unspecified: Secondary | ICD-10-CM | POA: Insufficient documentation

## 2021-02-19 DIAGNOSIS — I129 Hypertensive chronic kidney disease with stage 1 through stage 4 chronic kidney disease, or unspecified chronic kidney disease: Secondary | ICD-10-CM | POA: Insufficient documentation

## 2021-02-19 DIAGNOSIS — Z5111 Encounter for antineoplastic chemotherapy: Secondary | ICD-10-CM | POA: Diagnosis not present

## 2021-02-19 LAB — CBC WITH DIFFERENTIAL/PLATELET
Abs Immature Granulocytes: 0.01 10*3/uL (ref 0.00–0.07)
Basophils Absolute: 0.1 10*3/uL (ref 0.0–0.1)
Basophils Relative: 2 %
Eosinophils Absolute: 0.3 10*3/uL (ref 0.0–0.5)
Eosinophils Relative: 7 %
HCT: 42.9 % (ref 39.0–52.0)
Hemoglobin: 13.8 g/dL (ref 13.0–17.0)
Immature Granulocytes: 0 %
Lymphocytes Relative: 27 %
Lymphs Abs: 1.3 10*3/uL (ref 0.7–4.0)
MCH: 31.7 pg (ref 26.0–34.0)
MCHC: 32.2 g/dL (ref 30.0–36.0)
MCV: 98.4 fL (ref 80.0–100.0)
Monocytes Absolute: 0.7 10*3/uL (ref 0.1–1.0)
Monocytes Relative: 15 %
Neutro Abs: 2.4 10*3/uL (ref 1.7–7.7)
Neutrophils Relative %: 49 %
Platelets: 81 10*3/uL — ABNORMAL LOW (ref 150–400)
RBC: 4.36 MIL/uL (ref 4.22–5.81)
RDW: 15.8 % — ABNORMAL HIGH (ref 11.5–15.5)
WBC: 4.7 10*3/uL (ref 4.0–10.5)
nRBC: 0 % (ref 0.0–0.2)

## 2021-02-19 LAB — COMPREHENSIVE METABOLIC PANEL
ALT: 39 U/L (ref 0–44)
AST: 42 U/L — ABNORMAL HIGH (ref 15–41)
Albumin: 3.6 g/dL (ref 3.5–5.0)
Alkaline Phosphatase: 175 U/L — ABNORMAL HIGH (ref 38–126)
Anion gap: 6 (ref 5–15)
BUN: 12 mg/dL (ref 8–23)
CO2: 24 mmol/L (ref 22–32)
Calcium: 8.6 mg/dL — ABNORMAL LOW (ref 8.9–10.3)
Chloride: 106 mmol/L (ref 98–111)
Creatinine, Ser: 1.37 mg/dL — ABNORMAL HIGH (ref 0.61–1.24)
GFR, Estimated: 53 mL/min — ABNORMAL LOW (ref >60)
Glucose, Bld: 124 mg/dL — ABNORMAL HIGH (ref 70–99)
Potassium: 3.9 mmol/L (ref 3.5–5.1)
Sodium: 136 mmol/L (ref 135–145)
Total Bilirubin: 0.7 mg/dL (ref 0.3–1.2)
Total Protein: 6.8 g/dL (ref 6.5–8.1)

## 2021-02-19 LAB — URINALYSIS, COMPLETE (UACMP) WITH MICROSCOPIC
Bacteria, UA: NONE SEEN
Bilirubin Urine: NEGATIVE
Glucose, UA: NEGATIVE mg/dL
Leukocytes,Ua: NEGATIVE
Nitrite: NEGATIVE
Specific Gravity, Urine: 1.02 (ref 1.005–1.030)
Squamous Epithelial / HPF: NONE SEEN (ref 0–5)
pH: 5.5 (ref 5.0–8.0)

## 2021-02-19 MED ORDER — DEXTROSE 5 % IV SOLN
Freq: Once | INTRAVENOUS | Status: AC
  Filled 2021-02-19: qty 250

## 2021-02-19 MED ORDER — SODIUM CHLORIDE 0.9 % IV SOLN
Freq: Once | INTRAVENOUS | Status: AC
  Filled 2021-02-19: qty 250

## 2021-02-19 MED ORDER — SODIUM CHLORIDE 0.9 % IV SOLN
2400.0000 mg/m2 | INTRAVENOUS | Status: DC
  Administered 2021-02-19: 4650 mg via INTRAVENOUS
  Filled 2021-02-19: qty 93

## 2021-02-19 MED ORDER — OXALIPLATIN CHEMO INJECTION 100 MG/20ML
70.0000 mg/m2 | Freq: Once | INTRAVENOUS | Status: AC
  Administered 2021-02-19: 135 mg via INTRAVENOUS
  Filled 2021-02-19: qty 20

## 2021-02-19 MED ORDER — PALONOSETRON HCL INJECTION 0.25 MG/5ML
0.2500 mg | Freq: Once | INTRAVENOUS | Status: AC
  Administered 2021-02-19: 0.25 mg via INTRAVENOUS
  Filled 2021-02-19: qty 5

## 2021-02-19 MED ORDER — SODIUM CHLORIDE 0.9 % IV SOLN
10.0000 mg/kg | INTRAVENOUS | Status: DC
  Administered 2021-02-19: 800 mg via INTRAVENOUS
  Filled 2021-02-19: qty 32

## 2021-02-19 MED ORDER — SODIUM CHLORIDE 0.9 % IV SOLN
10.0000 mg | Freq: Once | INTRAVENOUS | Status: AC
  Administered 2021-02-19: 10 mg via INTRAVENOUS
  Filled 2021-02-19: qty 10

## 2021-02-19 NOTE — Assessment & Plan Note (Signed)
#   STAGE IV- Synchronous primaries a] rectal cancer-10cm-adenocarcinoma & b] transverse colon-- intramucosal adenoca]; abdominal lymphadenopathy; liver metastases; omental metastases.  S/p 6 cycles of FOLFOX plus Bev-CT scan December 27, 2020-CT scan shows partial response with improvement of the omental lesion no progressive disease. STABLE  # proceed with FOLFOX #10  today; continue BEV [see below]  Labs today reviewed;  acceptable for treatment today. Will order CT scan for 4 weeks/ ordered today  # Anemia/iron deficiency hemoglobin ~13-  secondary to chronic GI bleeding/tumor.STABLE  #Thrombocytopenia/secondary chemotherapy/on Plavix-aspirin-platelets-81- [decreased oxaliplatin dose by 20% with #6 cycle]-  -slightly worsened/ mild nose bleeds-? Avastin. We will monitor for now; if worse would recommend discontinuation of oxaliplatin.   # HTN- 166/96; at home BP reviewed- 130/ DBP80-90ss.continue checking BP at home/reviewed the log from home.  Proceed with avastin today. STABLE.   # Chronic kidney disease stage III-[creat 1.4] - STABLE.   # DISPOSITION:   # FOLFOX +Bev.  # Follow up in 2 weeks- MD: labs- cbc/cmp;UA CEA;  FOLFOX+ bev; Pump off in 2 days; # CT scan in 4 weeks--Dr.B

## 2021-02-19 NOTE — Progress Notes (Signed)
[place] NOTE  Patient Care Team: [name], MD as PCP - General (Family Medicine) Clent Jacks, RN as Oncology Nurse Navigator  CHIEF COMPLAINTS/PURPOSE OF CONSULTATION: colon/rectal cancer  #  Oncology History Overview Note  # Malignant partially obstructing tumor in the transverse colon/70 cm proximal to the anus- C. COLON MASS, 70 CM; COLD BIOPSY:  - INTRAMUCOSAL ADENOCARCINOMA AT LEAST;  # One 20 mm polyp in the transverse colon, removed with mucosal resection. Resected and retrieved. Clips were placed. Tattooed.  # tumor in the mid rectum and at 10 cm proximal to the anus. Biopsied.      SEE COMMENT.   Comment:  There is no definitive evidence of invasion in this sample.  The  findings may not accurately represent the entire underlying lesion;  clinical correlation is recommended.   D. COLON POLYP, TRANSVERSE; HOT SNARE:  - TUBULOVILLOUS ADENOMA.  - NEGATIVE FOR HIGH GRADE DYSPLASIA AND MALIGNANCY.   E. RECTUM MASS; COLD BIOPSY:  - INVASIVE ADENOCARCINOMA, MODERATELY TO POORLY DIFFERENTIATED. ----------------------------------   # PET scan: SEP 2022-proximal right colon [adjacent mesenteric lymph nodes] and rectal hypermetabolism.  Additional subcentimeter bilateral hypermetabolic lymphadenopathy noted in the retroperitoneal/left external iliac; inferior right lobe of the liver concerning for metastatic disease; right lower quadrant soft tissue mass concerning for peritoneal carcinomatosis; bilateral solid pulmonary nodules ~5 mm; MRI rectum- T stage: T4a; N stage:  N1.   # 09/12-2020- FOLFOX chemo [Dr.White/Dr.Vanga]; ADDED BEV with cycle #3-DC 5-FU bolus+LV; # 6-oxaliplatin dose reduced by 20%[thrombocytopenia]   Rectal cancer ([place])  09/11/2020 Initial Diagnosis   Rectal cancer ([place])   09/25/2020 -  Chemotherapy   Patient is on Treatment Plan : COLORECTAL FOLFOX q14d x 4 months     09/28/2020 Cancer Staging   Staging form:  Colon and Rectum, AJCC 8th Edition - Clinical: Stage IVC (cT4a, cN1, cM1c) - Signed by Cammie Sickle, MD on 09/28/2020     Genetic Testing   Negative genetic testing. No pathogenic variants identified on the [facility] Multi-Cancer+RNA Panel. The report date is 11/05/2020.  The Multi-Cancer Panel + RNA offered by [facility] includes sequencing and/or deletion duplication testing of the following 84 genes: AIP, ALK, APC, ATM, AXIN2,BAP1,  BARD1, BLM, BMPR1A, BRCA1, BRCA2, BRIP1, CASR, CDC73, CDH1, CDK4, CDKN1B, CDKN1C, CDKN2A (p14ARF), CDKN2A (p16INK4a), CEBPA, CHEK2, CTNNA1, DICER1, DIS3L2, EGFR (c.2369C>T, p.Thr790Met variant only), EPCAM (Deletion/duplication testing only), FH, FLCN, GATA2, GPC3, GREM1 (Promoter region deletion/duplication testing only), HOXB13 (c.251G>A, p.Gly84Glu), HRAS, KIT, MAX, MEN1, MET, MITF (c.952G>A, p.Glu318Lys variant only), MLH1, MSH2, MSH3, MSH6, MUTYH, NBN, NF1, NF2, NTHL1, PALB2, PDGFRA, PHOX2B, PMS2, POLD1, POLE, POT1, PRKAR1A, PTCH1, PTEN, RAD50, RAD51C, RAD51D, RB1, RECQL4, RET, RUNX1, SDHAF2, SDHA (sequence changes only), SDHB, SDHC, SDHD, SMAD4, SMARCA4, SMARCB1, SMARCE1, STK11, SUFU, TERC, TERT, TMEM127, TP53, TSC1, TSC2, VHL, WRN and WT1.      HISTORY OF PRESENTING ILLNESS: Ambulating independently.  Accompanied by daughter.  Albert Giles 76 y.o.  male synchronous colon cancer/rectal cancer-stage IV-on FOLFOX chemotherapy is here for follow-up.  Denies any nausea vomiting denies any tingling or numbness.  No fever no chills no headaches.  Intermittent mild nosebleeds.  Review of Systems  Constitutional:  Negative for chills, diaphoresis, fever and weight loss.  HENT:  Negative for nosebleeds and sore throat.   Eyes:  Negative for double vision.  Respiratory:  Negative for cough, hemoptysis, sputum production, shortness of breath and wheezing.   Cardiovascular:  Negative for chest pain, palpitations, orthopnea and leg swelling.  Gastrointestinal:   Negative for abdominal pain, blood in stool, constipation, diarrhea, heartburn, melena, nausea and vomiting.  Genitourinary:  Negative for dysuria, frequency and urgency.  Skin: Negative.  Negative for itching and rash.  Neurological:  Negative for dizziness, tingling, focal weakness, weakness and headaches.  Endo/Heme/Allergies:  Does not bruise/bleed easily.  Psychiatric/Behavioral:  Negative for depression. The patient is not nervous/anxious and does not have insomnia.     MEDICAL HISTORY:  Past Medical History:  Diagnosis Date   CAD (coronary artery disease) 06/2014   UA with NSTEMI - cath with 99% prox L circ s/p stent, EF 40% ([place], [place] [facility])   Emphysema    mild   Ex-smoker    Family history of breast cancer    Family history of colon cancer    Family history of pancreatic cancer    NSTEMI (non-ST elevated myocardial infarction) ([place]) 07/13/2014    SURGICAL HISTORY: Past Surgical History:  Procedure Laterality Date   CARDIAC CATHETERIZATION N/A 07/14/2014   Left Heart Cath and Coronary Angiography with stent placement;  Surgeon: Teodoro Spray, MD   CARDIAC CATHETERIZATION N/A 07/14/2014   Coronary Stent Intervention;  Surgeon: Yolonda Kida, MD   COLONOSCOPY WITH PROPOFOL N/A 09/06/2020   Procedure: COLONOSCOPY WITH PROPOFOL;  Surgeon: Lin Landsman, MD;  Location[facility] ENDOSCOPY;  Service: Gastroenterology;  Laterality: N/A;   CYSTECTOMY     on back   ESOPHAGOGASTRODUODENOSCOPY N/A 09/06/2020   Procedure: ESOPHAGOGASTRODUODENOSCOPY (EGD);  Surgeon: Lin Landsman, MD;  Location: [facility] ENDOSCOPY;  Service: Gastroenterology;  Laterality: N/A;   INGUINAL HERNIA REPAIR Right 08/17/04   IR IMAGING GUIDED PORT INSERTION  09/13/2020    SOCIAL HISTORY: Social History   Socioeconomic History   Marital status: Married    Spouse name: Not on file   Number of children: Not on file   Years of education: Not on file   Highest education level: Not on file   Occupational History   Not on file  Tobacco Use   Smoking status: Former    Packs/day: 1.00    Years: 35.00    Pack years: 35.00    Types: Cigarettes    Quit date: 07/07/1998    Years since quitting: 22.6   Smokeless tobacco: Never  Vaping Use   Vaping Use: Never used  Substance and Sexual Activity   Alcohol use: No   Drug use: No   Sexual activity: Yes  Other Topics Concern   Not on file  Social History Narrative   Caffeine: 2 cups coffee, 2 cups tea/day   Lives with wife   Occupation: Higher education careers adviser, retired   Edu: HS   Activity: works in yard, walking   Diet: some water, fruits/vegetables daily   -------------------------------------------------------------------       [employer] [place]; [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    Social Determinants of Health   Financial Resource Strain: Low Risk    Difficulty of Paying Living Expenses: Not hard at all  Food Insecurity: No Food Insecurity   Worried About Charity fundraiser in the Last Year: Never true   [place] in the Last Year: Never true  Transportation Needs: No Transportation Needs   Lack of Transportation (Medical): No   Lack of Transportation (Non-Medical): No  Physical Activity: Inactive   Days of Exercise per Week: 0 days   Minutes of Exercise per Session: 0 min  Stress:  No Stress Concern Present   Feeling of Stress : Not at all  Social Connections: Not on file  Intimate Partner Violence: Not At Risk   Fear of Current or Ex-Partner: No   Emotionally Abused: No   Physically Abused: No   Sexually Abused: No    FAMILY HISTORY: Family History  Problem Relation Age of Onset   Cancer Mother 12       colon   Cancer Sister        breast   Crohn's disease Sister    Cancer Daughter        anal   Crohn's disease Niece    CAD Neg Hx    Stroke Neg Hx    Diabetes Neg Hx    Prostate cancer Neg Hx    Kidney cancer Neg Hx    Bladder  Cancer Neg Hx     ALLERGIES:  has No Known Allergies.  MEDICATIONS:  Current Outpatient Medications  Medication Sig Dispense Refill   aspirin EC 81 MG tablet Take 1 tablet (81 mg total) by mouth daily. 60 tablet 1   atorvastatin (LIPITOR) 40 MG tablet Take 1 tablet (40 mg total) by mouth daily at 6 PM. 60 tablet 1   Cholecalciferol (VITAMIN D3) 25 MCG (1000 UT) CAPS Take 1 capsule (1,000 Units total) by mouth daily. 30 capsule    clopidogrel (PLAVIX) 75 MG tablet Take 1 tablet (75 mg total) by mouth daily. 60 tablet 1   Cyanocobalamin (B-12) 1000 MCG SUBL Place 1 tablet under the tongue daily.     ferrous sulfate 324 (65 Fe) MG TBEC Take 1 tablet (325 mg total) by mouth every other day.     lidocaine-prilocaine (EMLA) cream Apply 1 application topically as needed (to port a cath 1 hour prior to each chemotherapy). 30 g 3   metoprolol tartrate (LOPRESSOR) 25 MG tablet Take 1 tablet (25 mg total) by mouth 2 (two) times daily. 60 tablet 1   ondansetron (ZOFRAN) 8 MG tablet Take 1 tablet (8 mg total) by mouth every 8 (eight) hours as needed for nausea or vomiting. 20 tablet 3   prochlorperazine (COMPAZINE) 10 MG tablet Take 1 tablet (10 mg total) by mouth every 6 (six) hours as needed for nausea or vomiting. 30 tablet 3   tadalafil (CIALIS) 20 MG tablet TAKE ONE TABLET BY MOUTH DAILY AS NEEDED FOR ERECTILE DYSFUNCTION 20 tablet 3   pantoprazole (PROTONIX) 40 MG tablet Take 1 tablet (40 mg total) by mouth 2 (two) times daily before a meal. 180 tablet 1   No current facility-administered medications for this visit.   Facility-Administered Medications Ordered in Other Visits  Medication Dose Route Frequency Provider Last Rate Last Admin   bevacizumab-awwb (MVASI) 800 mg in sodium chloride 0.9 % 100 mL chemo infusion  10 mg/kg (Treatment Plan Recorded) Intravenous Q14 Days Cammie Sickle, MD   Stopped at 02/19/21 1104   fluorouracil (ADRUCIL) 4,650 mg in sodium chloride 0.9 % 57 mL chemo  infusion  2,400 mg/m2 (Treatment Plan Recorded) Intravenous 1 day or 1 dose Charlaine Dalton R, MD   4,650 mg at 02/19/21 1338   heparin lock flush 100 unit/mL  500 Units Intracatheter Once PRN Cammie Sickle, MD       sodium chloride flush (NS) 0.9 % injection 10 mL  10 mL Intravenous PRN Charlaine Dalton R, MD   10 mL at 10/09/20 0855   sodium chloride flush (NS) 0.9 % injection 10 mL  10  mL Intracatheter PRN Cammie Sickle, MD          .  PHYSICAL EXAMINATION: ECOG PERFORMANCE STATUS: 0 - Asymptomatic  Vitals:   02/19/21 0839  BP: (!) 157/89  Pulse: (!) 59  Resp: 18  Temp: (!) 96.8 F (36 C)    Filed Weights   02/19/21 0839  Weight: 167 lb (75.8 kg)     Physical Exam Vitals and nursing note reviewed.  HENT:     Head: Normocephalic and atraumatic.     Mouth/Throat:     Pharynx: Oropharynx is clear.  Eyes:     Extraocular Movements: Extraocular movements intact.     Pupils: Pupils are equal, round, and reactive to light.  Cardiovascular:     Rate and Rhythm: Normal rate and regular rhythm.  Pulmonary:     Comments: Decreased breath sounds bilaterally.  Abdominal:     Palpations: Abdomen is soft.  Musculoskeletal:        General: Normal range of motion.     Cervical back: Normal range of motion.  Skin:    General: Skin is warm.  Neurological:     General: No focal deficit present.     Mental Status: He is alert and oriented to person, place, and time.  Psychiatric:        Behavior: Behavior normal.        Judgment: Judgment normal.     LABORATORY DATA:  I have reviewed the data as listed Lab Results  Component Value Date   WBC 4.7 02/19/2021   HGB 13.8 02/19/2021   HCT 42.9 02/19/2021   MCV 98.4 02/19/2021   PLT 81 (L) 02/19/2021   Recent Labs    01/22/21 0759 02/05/21 0803 02/19/21 0811  NA 135 139 136  K 4.1 3.9 3.9  CL 107 107 106  CO2 _0 GLUCOSE 127* 120* 124*  BUN _1 CREATININE 1.48* 1.45* 1.37*   CALCIUM 8.7* 8.9 8.6*  GFRNONAA 48* 50* 53*  PROT 6.7 6.5 6.8  ALBUMIN 3.5 3.6 3.6  AST 36 35 42*  ALT 37 37 39  ALKPHOS 177* 182* 175*  BILITOT 0.7 0.5 0.7    RADIOGRAPHIC STUDIES: I have personally reviewed the radiological images as listed and agreed with the findings in the report. No results found.  ASSESSMENT & PLAN:   Rectal cancer ([place])  # STAGE IV- Synchronous primaries a] rectal cancer-10cm-adenocarcinoma & b] transverse colon-- intramucosal adenoca]; abdominal lymphadenopathy; liver metastases; omental metastases.  S/p 6 cycles of FOLFOX plus Bev-CT scan December 27, 2020-CT scan shows partial response with improvement of the omental lesion no progressive disease. STABLE   # proceed with FOLFOX #10  today; continue BEV [see below]  Labs today reviewed;  acceptable for treatment today. Will order CT scan for 4 weeks/ ordered today  # Anemia/iron deficiency hemoglobin ~13-  secondary to chronic GI bleeding/tumor.STABLE  #Thrombocytopenia/secondary chemotherapy/on Plavix-aspirin-platelets-81- [decreased oxaliplatin dose by 20% with #6 cycle]-  -slightly worsened/ mild nose bleeds-? Avastin. We will monitor for now; if worse would recommend discontinuation of oxaliplatin.   # HTN- 166/96; at home BP reviewed- 130/ DBP80-90ss.continue checking BP at home/reviewed the log from home.  Proceed with avastin today. STABLE.   # Chronic kidney disease stage III-[creat 1.4] - STABLE.    # DISPOSITION:   # FOLFOX +Bev.  # Follow up in 2 weeks- MD: labs- cbc/cmp;UA CEA;  FOLFOX+ bev; Pump off in 2 days; # CT scan in 4  weeks--Dr.B      All questions were answered. The patient knows to call the clinic with any problems, questions or concerns.    Cammie Sickle, MD 02/19/2021 4:28 PM

## 2021-02-19 NOTE — Patient Instructions (Signed)
[facility] AT [place]  Discharge Instructions: Thank you for choosing [facility] to provide your oncology and hematology care.  If you have a lab appointment with the [facility], please go directly to the [facility] and check in at the registration area.  Wear comfortable clothing and clothing appropriate for easy access to any Portacath or PICC line.   We strive to give you quality time with your provider. You may need to reschedule your appointment if you arrive late (15 or more minutes).  Arriving late affects you and other patients whose appointments are after yours.  Also, if you miss three or more appointments without notifying the office, you may be dismissed from the clinic at the providers discretion.      For prescription refill requests, have your pharmacy contact our office and allow 72 hours for refills to be completed.    Today you received the following chemotherapy and/or immunotherapy agents Avastin, Oxalplatin, Adrucil    To help prevent nausea and vomiting after your treatment, we encourage you to take your nausea medication as directed.  BELOW ARE SYMPTOMS THAT SHOULD BE REPORTED IMMEDIATELY: *FEVER GREATER THAN 100.4 F (38 C) OR HIGHER *CHILLS OR SWEATING *NAUSEA AND VOMITING THAT IS NOT CONTROLLED WITH YOUR NAUSEA MEDICATION *UNUSUAL SHORTNESS OF BREATH *UNUSUAL BRUISING OR BLEEDING *URINARY PROBLEMS (pain or burning when urinating, or frequent urination) *BOWEL PROBLEMS (unusual diarrhea, constipation, pain near the anus) TENDERNESS IN MOUTH AND THROAT WITH OR WITHOUT PRESENCE OF ULCERS (sore throat, sores in mouth, or a toothache) UNUSUAL RASH, SWELLING OR PAIN  UNUSUAL VAGINAL DISCHARGE OR ITCHING   Items with * indicate a potential emergency and should be followed up as soon as possible or go to the Emergency Department if any problems should occur.  Please show the CHEMOTHERAPY ALERT CARD or IMMUNOTHERAPY ALERT CARD  at check-in to the Emergency Department and triage nurse.  Should you have questions after your visit or need to cancel or reschedule your appointment, please contact [facility] [place] [facility] [place]  [phone] and follow the prompts.  Office hours are 8:00 a.m. to 4:30 p.m. Monday - Friday. Please note that voicemails left after 4:00 p.m. may not be returned until the following business day.  We are closed weekends and major holidays. You have access to a nurse at all times for urgent questions. Please call the main number to the clinic [phone] and follow the prompts.  For any non-urgent questions, you may also contact your provider using MyChart. We now offer e-Visits for anyone 39 and older to request care online for non-urgent symptoms. For details visit [email or web address].   Also download the MyChart app! Go to the app store, search "MyChart", open the app, select [facility], and log in with your MyChart username and password.  Due to Covid, a mask is required upon entering the hospital/clinic. If you do not have a mask, one will be given to you upon arrival. For doctor visits, patients may have 1 support person aged 69 or older with them. For treatment visits, patients cannot have anyone with them due to current Covid guidelines and our immunocompromised population.

## 2021-02-19 NOTE — Progress Notes (Signed)
Patient denies new problems/concerns today.   °

## 2021-02-20 LAB — CEA: CEA: 3.9 ng/mL (ref 0.0–4.7)

## 2021-02-21 ENCOUNTER — Inpatient Hospital Stay: Payer: Medicare HMO

## 2021-02-21 ENCOUNTER — Other Ambulatory Visit: Payer: Self-pay

## 2021-02-21 ENCOUNTER — Encounter: Payer: Self-pay | Admitting: Internal Medicine

## 2021-02-21 VITALS — BP 142/86 | HR 66 | Temp 97.6°F | Resp 18

## 2021-02-21 DIAGNOSIS — C786 Secondary malignant neoplasm of retroperitoneum and peritoneum: Secondary | ICD-10-CM | POA: Diagnosis not present

## 2021-02-21 DIAGNOSIS — C787 Secondary malignant neoplasm of liver and intrahepatic bile duct: Secondary | ICD-10-CM | POA: Diagnosis not present

## 2021-02-21 DIAGNOSIS — C20 Malignant neoplasm of rectum: Secondary | ICD-10-CM | POA: Diagnosis not present

## 2021-02-21 DIAGNOSIS — N183 Chronic kidney disease, stage 3 unspecified: Secondary | ICD-10-CM | POA: Diagnosis not present

## 2021-02-21 DIAGNOSIS — Z5111 Encounter for antineoplastic chemotherapy: Secondary | ICD-10-CM | POA: Diagnosis not present

## 2021-02-21 DIAGNOSIS — D509 Iron deficiency anemia, unspecified: Secondary | ICD-10-CM | POA: Diagnosis not present

## 2021-02-21 DIAGNOSIS — I129 Hypertensive chronic kidney disease with stage 1 through stage 4 chronic kidney disease, or unspecified chronic kidney disease: Secondary | ICD-10-CM | POA: Diagnosis not present

## 2021-02-21 DIAGNOSIS — Z79899 Other long term (current) drug therapy: Secondary | ICD-10-CM | POA: Diagnosis not present

## 2021-02-21 MED ORDER — HEPARIN SOD (PORK) LOCK FLUSH 100 UNIT/ML IV SOLN
500.0000 [IU] | Freq: Once | INTRAVENOUS | Status: AC | PRN
  Administered 2021-02-21: 500 [IU]
  Filled 2021-02-21: qty 5

## 2021-02-25 DIAGNOSIS — C20 Malignant neoplasm of rectum: Secondary | ICD-10-CM | POA: Diagnosis not present

## 2021-03-05 ENCOUNTER — Other Ambulatory Visit: Payer: Self-pay

## 2021-03-05 ENCOUNTER — Inpatient Hospital Stay: Payer: Medicare HMO

## 2021-03-05 ENCOUNTER — Encounter: Payer: Self-pay | Admitting: Internal Medicine

## 2021-03-05 ENCOUNTER — Inpatient Hospital Stay (HOSPITAL_BASED_OUTPATIENT_CLINIC_OR_DEPARTMENT_OTHER): Payer: Medicare HMO | Admitting: Internal Medicine

## 2021-03-05 VITALS — BP 137/100 | HR 60

## 2021-03-05 DIAGNOSIS — D509 Iron deficiency anemia, unspecified: Secondary | ICD-10-CM | POA: Diagnosis not present

## 2021-03-05 DIAGNOSIS — C20 Malignant neoplasm of rectum: Secondary | ICD-10-CM

## 2021-03-05 DIAGNOSIS — Z79899 Other long term (current) drug therapy: Secondary | ICD-10-CM | POA: Diagnosis not present

## 2021-03-05 DIAGNOSIS — C787 Secondary malignant neoplasm of liver and intrahepatic bile duct: Secondary | ICD-10-CM | POA: Diagnosis not present

## 2021-03-05 DIAGNOSIS — I129 Hypertensive chronic kidney disease with stage 1 through stage 4 chronic kidney disease, or unspecified chronic kidney disease: Secondary | ICD-10-CM | POA: Diagnosis not present

## 2021-03-05 DIAGNOSIS — Z5111 Encounter for antineoplastic chemotherapy: Secondary | ICD-10-CM | POA: Diagnosis not present

## 2021-03-05 DIAGNOSIS — C786 Secondary malignant neoplasm of retroperitoneum and peritoneum: Secondary | ICD-10-CM | POA: Diagnosis not present

## 2021-03-05 DIAGNOSIS — N183 Chronic kidney disease, stage 3 unspecified: Secondary | ICD-10-CM | POA: Diagnosis not present

## 2021-03-05 LAB — CBC WITH DIFFERENTIAL/PLATELET
Abs Immature Granulocytes: 0.01 10*3/uL (ref 0.00–0.07)
Basophils Absolute: 0.1 10*3/uL (ref 0.0–0.1)
Basophils Relative: 1 %
Eosinophils Absolute: 0.3 10*3/uL (ref 0.0–0.5)
Eosinophils Relative: 6 %
HCT: 43.9 % (ref 39.0–52.0)
Hemoglobin: 14 g/dL (ref 13.0–17.0)
Immature Granulocytes: 0 %
Lymphocytes Relative: 25 %
Lymphs Abs: 1.3 10*3/uL (ref 0.7–4.0)
MCH: 32.2 pg (ref 26.0–34.0)
MCHC: 31.9 g/dL (ref 30.0–36.0)
MCV: 100.9 fL — ABNORMAL HIGH (ref 80.0–100.0)
Monocytes Absolute: 0.7 10*3/uL (ref 0.1–1.0)
Monocytes Relative: 13 %
Neutro Abs: 2.9 10*3/uL (ref 1.7–7.7)
Neutrophils Relative %: 55 %
Platelets: 77 10*3/uL — ABNORMAL LOW (ref 150–400)
RBC: 4.35 MIL/uL (ref 4.22–5.81)
RDW: 15.9 % — ABNORMAL HIGH (ref 11.5–15.5)
WBC: 5.4 10*3/uL (ref 4.0–10.5)
nRBC: 0 % (ref 0.0–0.2)

## 2021-03-05 LAB — COMPREHENSIVE METABOLIC PANEL
ALT: 28 U/L (ref 0–44)
AST: 31 U/L (ref 15–41)
Albumin: 3.5 g/dL (ref 3.5–5.0)
Alkaline Phosphatase: 171 U/L — ABNORMAL HIGH (ref 38–126)
Anion gap: 6 (ref 5–15)
BUN: 14 mg/dL (ref 8–23)
CO2: 23 mmol/L (ref 22–32)
Calcium: 8.6 mg/dL — ABNORMAL LOW (ref 8.9–10.3)
Chloride: 106 mmol/L (ref 98–111)
Creatinine, Ser: 1.45 mg/dL — ABNORMAL HIGH (ref 0.61–1.24)
GFR, Estimated: 50 mL/min — ABNORMAL LOW (ref >60)
Glucose, Bld: 121 mg/dL — ABNORMAL HIGH (ref 70–99)
Potassium: 4 mmol/L (ref 3.5–5.1)
Sodium: 135 mmol/L (ref 135–145)
Total Bilirubin: 0.5 mg/dL (ref 0.3–1.2)
Total Protein: 6.6 g/dL (ref 6.5–8.1)

## 2021-03-05 LAB — URINALYSIS, COMPLETE (UACMP) WITH MICROSCOPIC
Bacteria, UA: NONE SEEN
Bilirubin Urine: NEGATIVE
Glucose, UA: NEGATIVE mg/dL
Hgb urine dipstick: NEGATIVE
Ketones, ur: NEGATIVE mg/dL
Leukocytes,Ua: NEGATIVE
Nitrite: NEGATIVE
Protein, ur: NEGATIVE mg/dL
Specific Gravity, Urine: 1.016 (ref 1.005–1.030)
Squamous Epithelial / HPF: NONE SEEN (ref 0–5)
pH: 5 (ref 5.0–8.0)

## 2021-03-05 MED ORDER — SODIUM CHLORIDE 0.9 % IV SOLN
10.0000 mg/kg | INTRAVENOUS | Status: DC
  Administered 2021-03-05: 800 mg via INTRAVENOUS
  Filled 2021-03-05: qty 32

## 2021-03-05 MED ORDER — SODIUM CHLORIDE 0.9% FLUSH
10.0000 mL | INTRAVENOUS | Status: DC | PRN
  Administered 2021-03-05: 10 mL via INTRAVENOUS
  Filled 2021-03-05: qty 10

## 2021-03-05 MED ORDER — DEXTROSE 5 % IV SOLN
Freq: Once | INTRAVENOUS | Status: AC
  Filled 2021-03-05: qty 250

## 2021-03-05 MED ORDER — OXALIPLATIN CHEMO INJECTION 100 MG/20ML
70.0000 mg/m2 | Freq: Once | INTRAVENOUS | Status: AC
  Administered 2021-03-05: 135 mg via INTRAVENOUS
  Filled 2021-03-05: qty 20

## 2021-03-05 MED ORDER — SODIUM CHLORIDE 0.9 % IV SOLN
Freq: Once | INTRAVENOUS | Status: AC
  Filled 2021-03-05: qty 250

## 2021-03-05 MED ORDER — SODIUM CHLORIDE 0.9 % IV SOLN
2400.0000 mg/m2 | INTRAVENOUS | Status: DC
  Administered 2021-03-05: 4650 mg via INTRAVENOUS
  Filled 2021-03-05: qty 93

## 2021-03-05 MED ORDER — SODIUM CHLORIDE 0.9 % IV SOLN
10.0000 mg | Freq: Once | INTRAVENOUS | Status: AC
  Administered 2021-03-05: 10 mg via INTRAVENOUS
  Filled 2021-03-05: qty 10

## 2021-03-05 MED ORDER — PALONOSETRON HCL INJECTION 0.25 MG/5ML
0.2500 mg | Freq: Once | INTRAVENOUS | Status: AC
  Administered 2021-03-05: 0.25 mg via INTRAVENOUS
  Filled 2021-03-05: qty 5

## 2021-03-05 NOTE — Progress Notes (Signed)
Labs and vitals reviewed with MD. Per MD to proceed with treatment.   [place] [employer]

## 2021-03-05 NOTE — Progress Notes (Signed)
[place] NOTE  Patient Care Team: [name], MD as PCP - General (Family Medicine) Clent Jacks, RN as Oncology Nurse Navigator  CHIEF COMPLAINTS/PURPOSE OF CONSULTATION: colon/rectal cancer  #  Oncology History Overview Note  # Malignant partially obstructing tumor in the transverse colon/70 cm proximal to the anus- C. COLON MASS, 70 CM; COLD BIOPSY:  - INTRAMUCOSAL ADENOCARCINOMA AT LEAST;  # One 20 mm polyp in the transverse colon, removed with mucosal resection. Resected and retrieved. Clips were placed. Tattooed.  # tumor in the mid rectum and at 10 cm proximal to the anus. Biopsied.      SEE COMMENT.   Comment:  There is no definitive evidence of invasion in this sample.  The  findings may not accurately represent the entire underlying lesion;  clinical correlation is recommended.   D. COLON POLYP, TRANSVERSE; HOT SNARE:  - TUBULOVILLOUS ADENOMA.  - NEGATIVE FOR HIGH GRADE DYSPLASIA AND MALIGNANCY.   E. RECTUM MASS; COLD BIOPSY:  - INVASIVE ADENOCARCINOMA, MODERATELY TO POORLY DIFFERENTIATED. ----------------------------------   # PET scan: SEP 2022-proximal right colon [adjacent mesenteric lymph nodes] and rectal hypermetabolism.  Additional subcentimeter bilateral hypermetabolic lymphadenopathy noted in the retroperitoneal/left external iliac; inferior right lobe of the liver concerning for metastatic disease; right lower quadrant soft tissue mass concerning for peritoneal carcinomatosis; bilateral solid pulmonary nodules ~5 mm; MRI rectum- T stage: T4a; N stage:  N1.   # 09/12-2020- FOLFOX chemo [Dr.White/Dr.Vanga]; ADDED BEV with cycle #3-DC 5-FU bolus+LV; # 6-oxaliplatin dose reduced by 20%[thrombocytopenia]   Rectal cancer ([place])  09/11/2020 Initial Diagnosis   Rectal cancer ([place])   09/25/2020 -  Chemotherapy   Patient is on Treatment Plan : COLORECTAL FOLFOX q14d x 4 months     09/28/2020 Cancer Staging   Staging form:  Colon and Rectum, AJCC 8th Edition - Clinical: Stage IVC (cT4a, cN1, cM1c) - Signed by Cammie Sickle, MD on 09/28/2020     Genetic Testing   Negative genetic testing. No pathogenic variants identified on the [facility] Multi-Cancer+RNA Panel. The report date is 11/05/2020.  The Multi-Cancer Panel + RNA offered by [facility] includes sequencing and/or deletion duplication testing of the following 84 genes: AIP, ALK, APC, ATM, AXIN2,BAP1,  BARD1, BLM, BMPR1A, BRCA1, BRCA2, BRIP1, CASR, CDC73, CDH1, CDK4, CDKN1B, CDKN1C, CDKN2A (p14ARF), CDKN2A (p16INK4a), CEBPA, CHEK2, CTNNA1, DICER1, DIS3L2, EGFR (c.2369C>T, p.Thr790Met variant only), EPCAM (Deletion/duplication testing only), FH, FLCN, GATA2, GPC3, GREM1 (Promoter region deletion/duplication testing only), HOXB13 (c.251G>A, p.Gly84Glu), HRAS, KIT, MAX, MEN1, MET, MITF (c.952G>A, p.Glu318Lys variant only), MLH1, MSH2, MSH3, MSH6, MUTYH, NBN, NF1, NF2, NTHL1, PALB2, PDGFRA, PHOX2B, PMS2, POLD1, POLE, POT1, PRKAR1A, PTCH1, PTEN, RAD50, RAD51C, RAD51D, RB1, RECQL4, RET, RUNX1, SDHAF2, SDHA (sequence changes only), SDHB, SDHC, SDHD, SMAD4, SMARCA4, SMARCB1, SMARCE1, STK11, SUFU, TERC, TERT, TMEM127, TP53, TSC1, TSC2, VHL, WRN and WT1.      HISTORY OF PRESENTING ILLNESS: Ambulating independently.  Accompanied by daughter.  Albert Giles 76 y.o.  male synchronous colon cancer/rectal cancer-stage IV-on FOLFOX chemotherapy is here for follow-up.  Denies any nausea vomiting denies any tingling or numbness.  No fever no chills no headaches.  Intermittent mild nosebleeds.  Review of Systems  Constitutional:  Negative for chills, diaphoresis, fever and weight loss.  HENT:  Negative for nosebleeds and sore throat.   Eyes:  Negative for double vision.  Respiratory:  Negative for cough, hemoptysis, sputum production, shortness of breath and wheezing.   Cardiovascular:  Negative for chest pain, palpitations, orthopnea and leg swelling.  Gastrointestinal:   Negative for abdominal pain, blood in stool, constipation, diarrhea, heartburn, melena, nausea and vomiting.  Genitourinary:  Negative for dysuria, frequency and urgency.  Skin: Negative.  Negative for itching and rash.  Neurological:  Negative for dizziness, tingling, focal weakness, weakness and headaches.  Endo/Heme/Allergies:  Does not bruise/bleed easily.  Psychiatric/Behavioral:  Negative for depression. The patient is not nervous/anxious and does not have insomnia.     MEDICAL HISTORY:  Past Medical History:  Diagnosis Date   CAD (coronary artery disease) 06/2014   UA with NSTEMI - cath with 99% prox L circ s/p stent, EF 40% ([place], [place] [facility])   Emphysema    mild   Ex-smoker    Family history of breast cancer    Family history of colon cancer    Family history of pancreatic cancer    NSTEMI (non-ST elevated myocardial infarction) ([place]) 07/13/2014    SURGICAL HISTORY: Past Surgical History:  Procedure Laterality Date   CARDIAC CATHETERIZATION N/A 07/14/2014   Left Heart Cath and Coronary Angiography with stent placement;  Surgeon: Teodoro Spray, MD   CARDIAC CATHETERIZATION N/A 07/14/2014   Coronary Stent Intervention;  Surgeon: Yolonda Kida, MD   COLONOSCOPY WITH PROPOFOL N/A 09/06/2020   Procedure: COLONOSCOPY WITH PROPOFOL;  Surgeon: Lin Landsman, MD;  Location[facility] ENDOSCOPY;  Service: Gastroenterology;  Laterality: N/A;   CYSTECTOMY     on back   ESOPHAGOGASTRODUODENOSCOPY N/A 09/06/2020   Procedure: ESOPHAGOGASTRODUODENOSCOPY (EGD);  Surgeon: Lin Landsman, MD;  Location: [facility] ENDOSCOPY;  Service: Gastroenterology;  Laterality: N/A;   INGUINAL HERNIA REPAIR Right 08/17/04   IR IMAGING GUIDED PORT INSERTION  09/13/2020    SOCIAL HISTORY: Social History   Socioeconomic History   Marital status: Married    Spouse name: Not on file   Number of children: Not on file   Years of education: Not on file   Highest education level: Not on file   Occupational History   Not on file  Tobacco Use   Smoking status: Former    Packs/day: 1.00    Years: 35.00    Pack years: 35.00    Types: Cigarettes    Quit date: 07/07/1998    Years since quitting: 22.6   Smokeless tobacco: Never  Vaping Use   Vaping Use: Never used  Substance and Sexual Activity   Alcohol use: No   Drug use: No   Sexual activity: Yes  Other Topics Concern   Not on file  Social History Narrative   Caffeine: 2 cups coffee, 2 cups tea/day   Lives with wife   Occupation: Higher education careers adviser, retired   Edu: HS   Activity: works in yard, walking   Diet: some water, fruits/vegetables daily   -------------------------------------------------------------------       [employer] [place]; [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    Social Determinants of Health   Financial Resource Strain: Low Risk    Difficulty of Paying Living Expenses: Not hard at all  Food Insecurity: No Food Insecurity   Worried About Charity fundraiser in the Last Year: Never true   [place] in the Last Year: Never true  Transportation Needs: No Transportation Needs   Lack of Transportation (Medical): No   Lack of Transportation (Non-Medical): No  Physical Activity: Inactive   Days of Exercise per Week: 0 days   Minutes of Exercise per Session: 0 min  Stress:  No Stress Concern Present   Feeling of Stress : Not at all  Social Connections: Not on file  Intimate Partner Violence: Not At Risk   Fear of Current or Ex-Partner: No   Emotionally Abused: No   Physically Abused: No   Sexually Abused: No    FAMILY HISTORY: Family History  Problem Relation Age of Onset   Cancer Mother 96       colon   Cancer Sister        breast   Crohn's disease Sister    Cancer Daughter        anal   Crohn's disease Niece    CAD Neg Hx    Stroke Neg Hx    Diabetes Neg Hx    Prostate cancer Neg Hx    Kidney cancer Neg Hx    Bladder  Cancer Neg Hx     ALLERGIES:  has No Known Allergies.  MEDICATIONS:  Current Outpatient Medications  Medication Sig Dispense Refill   aspirin EC 81 MG tablet Take 1 tablet (81 mg total) by mouth daily. 60 tablet 1   atorvastatin (LIPITOR) 40 MG tablet Take 1 tablet (40 mg total) by mouth daily at 6 PM. 60 tablet 1   Cholecalciferol (VITAMIN D3) 25 MCG (1000 UT) CAPS Take 1 capsule (1,000 Units total) by mouth daily. 30 capsule    clopidogrel (PLAVIX) 75 MG tablet Take 1 tablet (75 mg total) by mouth daily. 60 tablet 1   Cyanocobalamin (B-12) 1000 MCG SUBL Place 1 tablet under the tongue daily.     ferrous sulfate 324 (65 Fe) MG TBEC Take 1 tablet (325 mg total) by mouth every other day.     lidocaine-prilocaine (EMLA) cream Apply 1 application topically as needed (to port a cath 1 hour prior to each chemotherapy). 30 g 3   metoprolol tartrate (LOPRESSOR) 25 MG tablet Take 1 tablet (25 mg total) by mouth 2 (two) times daily. 60 tablet 1   ondansetron (ZOFRAN) 8 MG tablet Take 1 tablet (8 mg total) by mouth every 8 (eight) hours as needed for nausea or vomiting. 20 tablet 3   pantoprazole (PROTONIX) 40 MG tablet Take 1 tablet (40 mg total) by mouth 2 (two) times daily before a meal. 180 tablet 1   prochlorperazine (COMPAZINE) 10 MG tablet Take 1 tablet (10 mg total) by mouth every 6 (six) hours as needed for nausea or vomiting. 30 tablet 3   tadalafil (CIALIS) 20 MG tablet TAKE ONE TABLET BY MOUTH DAILY AS NEEDED FOR ERECTILE DYSFUNCTION 20 tablet 3   No current facility-administered medications for this visit.   Facility-Administered Medications Ordered in Other Visits  Medication Dose Route Frequency Provider Last Rate Last Admin   bevacizumab-awwb (MVASI) 800 mg in sodium chloride 0.9 % 100 mL chemo infusion  10 mg/kg (Treatment Plan Recorded) Intravenous Q14 Days Cammie Sickle, MD 264 mL/hr at 03/05/21 1045 800 mg at 03/05/21 1045   fluorouracil (ADRUCIL) 4,650 mg in sodium  chloride 0.9 % 57 mL chemo infusion  2,400 mg/m2 (Treatment Plan Recorded) Intravenous 1 day or 1 dose Charlaine Dalton R, MD       heparin lock flush 100 unit/mL  500 Units Intracatheter Once PRN Cammie Sickle, MD       oxaliplatin (ELOXATIN) 135 mg in dextrose 5 % 500 mL chemo infusion  70 mg/m2 (Treatment Plan Recorded) Intravenous Once Cammie Sickle, MD 264 mL/hr at 03/05/21 1126 135 mg at 03/05/21 1126   sodium  chloride flush (NS) 0.9 % injection 10 mL  10 mL Intravenous PRN Charlaine Dalton R, MD   10 mL at 10/09/20 0855   sodium chloride flush (NS) 0.9 % injection 10 mL  10 mL Intracatheter PRN Cammie Sickle, MD       sodium chloride flush (NS) 0.9 % injection 10 mL  10 mL Intravenous PRN Cammie Sickle, MD   10 mL at 03/05/21 0832      .  PHYSICAL EXAMINATION: ECOG PERFORMANCE STATUS: 0 - Asymptomatic  Vitals:   03/05/21 0800  BP: (!) 171/95  Pulse: 61  Resp: 16  Temp: (!) 96.6 F (35.9 C)    Filed Weights   03/05/21 0800  Weight: 167 lb (75.8 kg)     Physical Exam Vitals and nursing note reviewed.  HENT:     Head: Normocephalic and atraumatic.     Mouth/Throat:     Pharynx: Oropharynx is clear.  Eyes:     Extraocular Movements: Extraocular movements intact.     Pupils: Pupils are equal, round, and reactive to light.  Cardiovascular:     Rate and Rhythm: Normal rate and regular rhythm.  Pulmonary:     Comments: Decreased breath sounds bilaterally.  Abdominal:     Palpations: Abdomen is soft.  Musculoskeletal:        General: Normal range of motion.     Cervical back: Normal range of motion.  Skin:    General: Skin is warm.  Neurological:     General: No focal deficit present.     Mental Status: He is alert and oriented to person, place, and time.  Psychiatric:        Behavior: Behavior normal.        Judgment: Judgment normal.     LABORATORY DATA:  I have reviewed the data as listed Lab Results  Component Value  Date   WBC 5.4 03/05/2021   HGB 14.0 03/05/2021   HCT 43.9 03/05/2021   MCV 100.9 (H) 03/05/2021   PLT 77 (L) 03/05/2021   Recent Labs    02/05/21 0803 02/19/21 0811 03/05/21 0832  NA 139 136 135  K 3.9 3.9 4.0  CL 107 106 106  CO2 $Re'22 24 23  'fCb$ GLUCOSE 120* 124* 121*  BUN $Re'16 12 14  'CET$ CREATININE 1.45* 1.37* 1.45*  CALCIUM 8.9 8.6* 8.6*  GFRNONAA 50* 53* 50*  PROT 6.5 6.8 6.6  ALBUMIN 3.6 3.6 3.5  AST 35 42* 31  ALT 37 39 28  ALKPHOS 182* 175* 171*  BILITOT 0.5 0.7 0.5    RADIOGRAPHIC STUDIES: I have personally reviewed the radiological images as listed and agreed with the findings in the report. No results found.  ASSESSMENT & PLAN:   Rectal cancer ([place])  # STAGE IV- Synchronous primaries a] rectal cancer-10cm-adenocarcinoma & b] transverse colon-- intramucosal adenoca]; abdominal lymphadenopathy; liver metastases; omental metastases.  S/p 6 cycles of FOLFOX plus Bev-CT scan December 27, 2020-CT scan shows partial response with improvement of the omental lesion no progressive disease. STABLE   # proceed with FOLFOX #11  today; continue BEV [see below]  Labs today reviewed;  acceptable for treatment today.  CT scan -pending 3/02.   # Anemia/iron deficiency hemoglobin ~13-  secondary to chronic GI bleeding/tumor-STABLE  #Thrombocytopenia/secondary chemotherapy/on Plavix-aspirin-platelets-77- [decreased oxaliplatin dose by 20% with #6 cycle]-  -slightly worsened/ mild nose bleeds-? Avastin. ? Discontinue at next cycle- after CT available.    # HTN- 171/95; at home BP reviewed- 130-150/ DBP80-90ss.continue checking  BP at home/reviewed the log from home.  Proceed with avastin today. STABLE.   # Chronic kidney disease stage III-[creat 1.4] - STABLE.    # DISPOSITION:   # FOLFOX +Bev today.   # Follow up in 2 weeks- MD: labs- cbc/cmp;UA CEA;  FOLFOX+ bev; Pump off in 2 days- Dr.B;       All questions were answered. The patient knows to call the clinic with any problems,  questions or concerns.    Cammie Sickle, MD 03/05/2021 12:50 PM

## 2021-03-05 NOTE — Progress Notes (Signed)
Patient denies new problems/concerns today.    Blood pressure is elevated on office check today at 171/95 HR 61.  Brings in home bp readings today with normal range bp.

## 2021-03-05 NOTE — Progress Notes (Signed)
Patient denies new problems/concerns today.    Blood pressure is elevated on office check today at 166/99 HR 61.  Brings in home bp readings today with normal range bp.

## 2021-03-05 NOTE — Patient Instructions (Addendum)
[facility] CANCER CTR AT [place]  Discharge Instructions: Thank you for choosing [facility] to provide your oncology and hematology care.  If you have a lab appointment with the [facility], please go directly to the [facility] and check in at the registration area.  Wear comfortable clothing and clothing appropriate for easy access to any Portacath or PICC line.   We strive to give you quality time with your provider. You may need to reschedule your appointment if you arrive late (15 or more minutes).  Arriving late affects you and other patients whose appointments are after yours.  Also, if you miss three or more appointments without notifying the office, you may be dismissed from the clinic at the providers discretion.      For prescription refill requests, have your pharmacy contact our office and allow 72 hours for refills to be completed.    Today you received the following chemotherapy and/or immunotherapy agents mvasi, adrucil, oxaliplatin     To help prevent nausea and vomiting after your treatment, we encourage you to take your nausea medication as directed.  BELOW ARE SYMPTOMS THAT SHOULD BE REPORTED IMMEDIATELY: *FEVER GREATER THAN 100.4 F (38 C) OR HIGHER *CHILLS OR SWEATING *NAUSEA AND VOMITING THAT IS NOT CONTROLLED WITH YOUR NAUSEA MEDICATION *UNUSUAL SHORTNESS OF BREATH *UNUSUAL BRUISING OR BLEEDING *URINARY PROBLEMS (pain or burning when urinating, or frequent urination) *BOWEL PROBLEMS (unusual diarrhea, constipation, pain near the anus) TENDERNESS IN MOUTH AND THROAT WITH OR WITHOUT PRESENCE OF ULCERS (sore throat, sores in mouth, or a toothache) UNUSUAL RASH, SWELLING OR PAIN  UNUSUAL VAGINAL DISCHARGE OR ITCHING   Items with * indicate a potential emergency and should be followed up as soon as possible or go to the Emergency Department if any problems should occur.  Please show the CHEMOTHERAPY ALERT CARD or IMMUNOTHERAPY ALERT CARD  at check-in to the Emergency Department and triage nurse.  Should you have questions after your visit or need to cancel or reschedule your appointment, please contact [facility] CANCER [place] AT [place]  [phone] and follow the prompts.  Office hours are 8:00 a.m. to 4:30 p.m. Monday - Friday. Please note that voicemails left after 4:00 p.m. may not be returned until the following business day.  We are closed weekends and major holidays. You have access to a nurse at all times for urgent questions. Please call the main number to the clinic [phone] and follow the prompts.  For any non-urgent questions, you may also contact your provider using MyChart. We now offer e-Visits for anyone 40 and older to request care online for non-urgent symptoms. For details visit [email or web address].   Also download the MyChart app! Go to the app store, search "MyChart", open the app, select [place], and log in with your MyChart username and password.  Due to Covid, a mask is required upon entering the hospital/clinic. If you do not have a mask, one will be given to you upon arrival. For doctor visits, patients may have 1 support person aged 8 or older with them. For treatment visits, patients cannot have anyone with them due to current Covid guidelines and our immunocompromised population.

## 2021-03-05 NOTE — Assessment & Plan Note (Addendum)
#   STAGE IV- Synchronous primaries a] rectal cancer-10cm-adenocarcinoma & b] transverse colon-- intramucosal adenoca]; abdominal lymphadenopathy; liver metastases; omental metastases.  S/p 6 cycles of FOLFOX plus Bev-CT scan December 27, 2020-CT scan shows partial response with improvement of the omental lesion no progressive disease. STABLE  # proceed with FOLFOX #11  today; continue BEV [see below]  Labs today reviewed;  acceptable for treatment today.  CT scan -pending 3/02.   # Anemia/iron deficiency hemoglobin ~13-  secondary to chronic GI bleeding/tumor-STABLE  #Thrombocytopenia/secondary chemotherapy/on Plavix-aspirin-platelets-77- [decreased oxaliplatin dose by 20% with #6 cycle]-  -slightly worsened/ mild nose bleeds-? Avastin. ? Discontinue at next cycle- after CT available.    # HTN- 171/95; at home BP reviewed- 130-150/ DBP80-90ss.continue checking BP at home/reviewed the log from home.  Proceed with avastin today. STABLE.   # Chronic kidney disease stage III-[creat 1.4] - STABLE.   # DISPOSITION:   # FOLFOX +Bev today.   # Follow up in 2 weeks- MD: labs- cbc/cmp;UA CEA;  FOLFOX+ bev; Pump off in 2 days- Dr.B;

## 2021-03-06 LAB — CEA: CEA: 4.1 ng/mL (ref 0.0–4.7)

## 2021-03-07 ENCOUNTER — Other Ambulatory Visit: Payer: Self-pay

## 2021-03-07 ENCOUNTER — Inpatient Hospital Stay: Payer: Medicare HMO

## 2021-03-07 VITALS — BP 151/81 | HR 71 | Temp 98.0°F | Resp 18

## 2021-03-07 DIAGNOSIS — Z79899 Other long term (current) drug therapy: Secondary | ICD-10-CM | POA: Diagnosis not present

## 2021-03-07 DIAGNOSIS — Z5111 Encounter for antineoplastic chemotherapy: Secondary | ICD-10-CM | POA: Diagnosis not present

## 2021-03-07 DIAGNOSIS — D509 Iron deficiency anemia, unspecified: Secondary | ICD-10-CM | POA: Diagnosis not present

## 2021-03-07 DIAGNOSIS — I129 Hypertensive chronic kidney disease with stage 1 through stage 4 chronic kidney disease, or unspecified chronic kidney disease: Secondary | ICD-10-CM | POA: Diagnosis not present

## 2021-03-07 DIAGNOSIS — C787 Secondary malignant neoplasm of liver and intrahepatic bile duct: Secondary | ICD-10-CM | POA: Diagnosis not present

## 2021-03-07 DIAGNOSIS — C20 Malignant neoplasm of rectum: Secondary | ICD-10-CM | POA: Diagnosis not present

## 2021-03-07 DIAGNOSIS — N183 Chronic kidney disease, stage 3 unspecified: Secondary | ICD-10-CM | POA: Diagnosis not present

## 2021-03-07 DIAGNOSIS — C786 Secondary malignant neoplasm of retroperitoneum and peritoneum: Secondary | ICD-10-CM | POA: Diagnosis not present

## 2021-03-07 MED ORDER — SODIUM CHLORIDE 0.9% FLUSH
10.0000 mL | INTRAVENOUS | Status: DC | PRN
  Administered 2021-03-07: 10 mL
  Filled 2021-03-07: qty 10

## 2021-03-07 MED ORDER — HEPARIN SOD (PORK) LOCK FLUSH 100 UNIT/ML IV SOLN
500.0000 [IU] | Freq: Once | INTRAVENOUS | Status: AC | PRN
  Administered 2021-03-07: 500 [IU]
  Filled 2021-03-07: qty 5

## 2021-03-09 ENCOUNTER — Other Ambulatory Visit: Payer: Self-pay | Admitting: Gastroenterology

## 2021-03-15 ENCOUNTER — Ambulatory Visit
Admission: RE | Admit: 2021-03-15 | Discharge: 2021-03-15 | Disposition: A | Discharge status: Home / Self Care | Payer: Medicare HMO | Source: Ambulatory Visit | Attending: Internal Medicine | Admitting: Internal Medicine

## 2021-03-15 ENCOUNTER — Other Ambulatory Visit: Payer: Self-pay

## 2021-03-15 DIAGNOSIS — C189 Malignant neoplasm of colon, unspecified: Secondary | ICD-10-CM | POA: Diagnosis not present

## 2021-03-15 DIAGNOSIS — C20 Malignant neoplasm of rectum: Secondary | ICD-10-CM | POA: Diagnosis not present

## 2021-03-15 DIAGNOSIS — N3289 Other specified disorders of bladder: Secondary | ICD-10-CM | POA: Diagnosis not present

## 2021-03-15 DIAGNOSIS — K6389 Other specified diseases of intestine: Secondary | ICD-10-CM | POA: Diagnosis not present

## 2021-03-15 DIAGNOSIS — N281 Cyst of kidney, acquired: Secondary | ICD-10-CM | POA: Diagnosis not present

## 2021-03-15 MED ORDER — IOHEXOL 300 MG/ML  SOLN
80.0000 mL | Freq: Once | INTRAMUSCULAR | Status: AC | PRN
  Administered 2021-03-15: 80 mL via INTRAVENOUS

## 2021-03-20 ENCOUNTER — Inpatient Hospital Stay (HOSPITAL_BASED_OUTPATIENT_CLINIC_OR_DEPARTMENT_OTHER): Payer: Medicare HMO | Admitting: Internal Medicine

## 2021-03-20 ENCOUNTER — Inpatient Hospital Stay: Payer: Medicare HMO | Attending: Internal Medicine

## 2021-03-20 ENCOUNTER — Inpatient Hospital Stay: Payer: Medicare HMO

## 2021-03-20 ENCOUNTER — Other Ambulatory Visit: Payer: Self-pay

## 2021-03-20 ENCOUNTER — Other Ambulatory Visit: Payer: Self-pay | Admitting: Internal Medicine

## 2021-03-20 VITALS — BP 164/90 | HR 61

## 2021-03-20 DIAGNOSIS — D509 Iron deficiency anemia, unspecified: Secondary | ICD-10-CM | POA: Insufficient documentation

## 2021-03-20 DIAGNOSIS — I10 Essential (primary) hypertension: Secondary | ICD-10-CM | POA: Insufficient documentation

## 2021-03-20 DIAGNOSIS — Z5111 Encounter for antineoplastic chemotherapy: Secondary | ICD-10-CM | POA: Diagnosis not present

## 2021-03-20 DIAGNOSIS — Z7902 Long term (current) use of antithrombotics/antiplatelets: Secondary | ICD-10-CM | POA: Insufficient documentation

## 2021-03-20 DIAGNOSIS — Z87891 Personal history of nicotine dependence: Secondary | ICD-10-CM | POA: Insufficient documentation

## 2021-03-20 DIAGNOSIS — Z5112 Encounter for antineoplastic immunotherapy: Secondary | ICD-10-CM | POA: Diagnosis not present

## 2021-03-20 DIAGNOSIS — I129 Hypertensive chronic kidney disease with stage 1 through stage 4 chronic kidney disease, or unspecified chronic kidney disease: Secondary | ICD-10-CM | POA: Diagnosis not present

## 2021-03-20 DIAGNOSIS — Z79899 Other long term (current) drug therapy: Secondary | ICD-10-CM | POA: Diagnosis not present

## 2021-03-20 DIAGNOSIS — C786 Secondary malignant neoplasm of retroperitoneum and peritoneum: Secondary | ICD-10-CM | POA: Insufficient documentation

## 2021-03-20 DIAGNOSIS — D696 Thrombocytopenia, unspecified: Secondary | ICD-10-CM | POA: Insufficient documentation

## 2021-03-20 DIAGNOSIS — C787 Secondary malignant neoplasm of liver and intrahepatic bile duct: Secondary | ICD-10-CM | POA: Diagnosis not present

## 2021-03-20 DIAGNOSIS — Z800 Family history of malignant neoplasm of digestive organs: Secondary | ICD-10-CM | POA: Diagnosis not present

## 2021-03-20 DIAGNOSIS — C20 Malignant neoplasm of rectum: Secondary | ICD-10-CM

## 2021-03-20 DIAGNOSIS — Z803 Family history of malignant neoplasm of breast: Secondary | ICD-10-CM | POA: Diagnosis not present

## 2021-03-20 DIAGNOSIS — Z7982 Long term (current) use of aspirin: Secondary | ICD-10-CM | POA: Diagnosis not present

## 2021-03-20 LAB — COMPREHENSIVE METABOLIC PANEL
ALT: 28 U/L (ref 0–44)
AST: 34 U/L (ref 15–41)
Albumin: 3.5 g/dL (ref 3.5–5.0)
Alkaline Phosphatase: 199 U/L — ABNORMAL HIGH (ref 38–126)
Anion gap: 7 (ref 5–15)
BUN: 16 mg/dL (ref 8–23)
CO2: 23 mmol/L (ref 22–32)
Calcium: 8.9 mg/dL (ref 8.9–10.3)
Chloride: 108 mmol/L (ref 98–111)
Creatinine, Ser: 1.63 mg/dL — ABNORMAL HIGH (ref 0.61–1.24)
GFR, Estimated: 43 mL/min — ABNORMAL LOW (ref >60)
Glucose, Bld: 137 mg/dL — ABNORMAL HIGH (ref 70–99)
Potassium: 3.8 mmol/L (ref 3.5–5.1)
Sodium: 138 mmol/L (ref 135–145)
Total Bilirubin: 0.8 mg/dL (ref 0.3–1.2)
Total Protein: 6.8 g/dL (ref 6.5–8.1)

## 2021-03-20 LAB — URINALYSIS, COMPLETE (UACMP) WITH MICROSCOPIC
Bacteria, UA: NONE SEEN
Bilirubin Urine: NEGATIVE
Glucose, UA: NEGATIVE mg/dL
Hgb urine dipstick: NEGATIVE
Ketones, ur: NEGATIVE mg/dL
Leukocytes,Ua: NEGATIVE
Nitrite: NEGATIVE
Protein, ur: 30 mg/dL — AB
Specific Gravity, Urine: 1.024 (ref 1.005–1.030)
Squamous Epithelial / HPF: NONE SEEN (ref 0–5)
pH: 5 (ref 5.0–8.0)

## 2021-03-20 LAB — CBC WITH DIFFERENTIAL/PLATELET
Abs Immature Granulocytes: 0.01 10*3/uL (ref 0.00–0.07)
Basophils Absolute: 0.1 10*3/uL (ref 0.0–0.1)
Basophils Relative: 2 %
Eosinophils Absolute: 0.3 10*3/uL (ref 0.0–0.5)
Eosinophils Relative: 7 %
HCT: 41.8 % (ref 39.0–52.0)
Hemoglobin: 13.6 g/dL (ref 13.0–17.0)
Immature Granulocytes: 0 %
Lymphocytes Relative: 29 %
Lymphs Abs: 1.2 10*3/uL (ref 0.7–4.0)
MCH: 32.9 pg (ref 26.0–34.0)
MCHC: 32.5 g/dL (ref 30.0–36.0)
MCV: 101 fL — ABNORMAL HIGH (ref 80.0–100.0)
Monocytes Absolute: 0.8 10*3/uL (ref 0.1–1.0)
Monocytes Relative: 19 %
Neutro Abs: 1.8 10*3/uL (ref 1.7–7.7)
Neutrophils Relative %: 43 %
Platelets: 84 10*3/uL — ABNORMAL LOW (ref 150–400)
RBC: 4.14 MIL/uL — ABNORMAL LOW (ref 4.22–5.81)
RDW: 15.6 % — ABNORMAL HIGH (ref 11.5–15.5)
WBC: 4.1 10*3/uL (ref 4.0–10.5)
nRBC: 0 % (ref 0.0–0.2)

## 2021-03-20 MED ORDER — SODIUM CHLORIDE 0.9 % IV SOLN
Freq: Once | INTRAVENOUS | Status: AC
  Filled 2021-03-20: qty 250

## 2021-03-20 MED ORDER — SODIUM CHLORIDE 0.9 % IV SOLN
10.0000 mg/kg | INTRAVENOUS | Status: DC
  Administered 2021-03-20: 800 mg via INTRAVENOUS
  Filled 2021-03-20: qty 32

## 2021-03-20 MED ORDER — SODIUM CHLORIDE 0.9 % IV SOLN
10.0000 mg | Freq: Once | INTRAVENOUS | Status: AC
  Administered 2021-03-20: 10 mg via INTRAVENOUS
  Filled 2021-03-20: qty 10

## 2021-03-20 MED ORDER — SODIUM CHLORIDE 0.9 % IV SOLN
2400.0000 mg/m2 | INTRAVENOUS | Status: DC
  Administered 2021-03-20: 4650 mg via INTRAVENOUS
  Filled 2021-03-20: qty 93

## 2021-03-20 NOTE — Assessment & Plan Note (Addendum)
#   STAGE IV- Synchronous primaries a] rectal cancer-10 cm-adenocarcinoma & b] transverse colon-- intramucosal adenoca]; abdominal lymphadenopathy; liver metastases; omental metastases.  S/p 12 cycles of FOLFOX plus Bev- CT scan -MARCH 2nd, 2023- Decreased size of a mental soft tissue nodule measuring 1.1 x 0.6 cm [previously- 1.6 x 1.2 cm.? ? ??# proceed with FOLFOX #13  Today; will discontinue oxaliplatin [given PN/thrombocytopenia] continue BEV [see below]  Labs today reviewed;  acceptable for treatment today.  ? ?# Anemia/iron deficiency hemoglobin ~13-  secondary to chronic GI bleeding/tumor-STABLE ? ?# Thrombocytopenia/secondary chemotherapy/on Plavix-aspirin-platelets-77- [decreased oxaliplatin dose by 20% with #6 cycle]-  -slightly worsened/ mild nose bleeds-? Avastin. Will discontinue oxaliplatin [starting #13 cycle ]  ? ?# HTN- 171/95; at home BP reviewed- 130-150/ DBP80-90s.continue checking BP at home/reviewed the log from home.  Proceed with avastin today. STABLE.  ? ?# PN G-1-2- sec to Oxaliplatin. Will HOLDoxaliplatin for now.  ? ?# Chronic kidney disease stage III-[GFR-43 ] - STABLE. Keep hydration.  ?? ?# DISPOSITION:   ?Re-check BP ?# 5FU+Bev today; discontinued oxaliplatin ?# Follow up in 2 weeks- MD: labs- cbc/cmp;UA CEA;  5FU+ bev; Pump off in 2 days- Dr.B; ? ? ? ? ?

## 2021-03-20 NOTE — Patient Instructions (Signed)
[facility] CANCER CTR AT [place]  Discharge Instructions: ?Thank you for choosing [facility] to provide your oncology and hematology care.  ?If you have a lab appointment with the [facility], please go directly to the [facility] and check in at the registration area. ? ?Wear comfortable clothing and clothing appropriate for easy access to any Portacath or PICC line.  ? ?We strive to give you quality time with your provider. You may need to reschedule your appointment if you arrive late (15 or more minutes).  Arriving late affects you and other patients whose appointments are after yours.  Also, if you miss three or more appointments without notifying the office, you may be dismissed from the clinic at the provider?s discretion.    ?  ?For prescription refill requests, have your pharmacy contact our office and allow 72 hours for refills to be completed.   ? ?Today you received the following chemotherapy and/or immunotherapy agents Avastin, Adrucil  ?  ?To help prevent nausea and vomiting after your treatment, we encourage you to take your nausea medication as directed. ? ?BELOW ARE SYMPTOMS THAT SHOULD BE REPORTED IMMEDIATELY: ?*FEVER GREATER THAN 100.4 F (38 ?C) OR HIGHER ?*CHILLS OR SWEATING ?*NAUSEA AND VOMITING THAT IS NOT CONTROLLED WITH YOUR NAUSEA MEDICATION ?*UNUSUAL SHORTNESS OF BREATH ?*UNUSUAL BRUISING OR BLEEDING ?*URINARY PROBLEMS (pain or burning when urinating, or frequent urination) ?*BOWEL PROBLEMS (unusual diarrhea, constipation, pain near the anus) ?TENDERNESS IN MOUTH AND THROAT WITH OR WITHOUT PRESENCE OF ULCERS (sore throat, sores in mouth, or a toothache) ?UNUSUAL RASH, SWELLING OR PAIN  ?UNUSUAL VAGINAL DISCHARGE OR ITCHING  ? ?Items with * indicate a potential emergency and should be followed up as soon as possible or go to the Emergency Department if any problems should occur. ? ?Please show the CHEMOTHERAPY ALERT CARD or IMMUNOTHERAPY ALERT CARD at check-in  to the Emergency Department and triage nurse. ? ?Should you have questions after your visit or need to cancel or reschedule your appointment, please contact [facility] CANCER [place] AT [place]  [phone] and follow the prompts.  Office hours are 8:00 a.m. to 4:30 p.m. Monday - Friday. Please note that voicemails left after 4:00 p.m. may not be returned until the following business day.  We are closed weekends and major holidays. You have access to a nurse at all times for urgent questions. Please call the main number to the clinic [phone] and follow the prompts. ? ?For any non-urgent questions, you may also contact your provider using MyChart. We now offer e-Visits for anyone 105 and older to request care online for non-urgent symptoms. For details visit [email or web address]. ?  ?Also download the MyChart app! Go to the app store, search "MyChart", open the app, select [facility], and log in with your MyChart username and password. ? ?Due to Covid, a mask is required upon entering the hospital/clinic. If you do not have a mask, one will be given to you upon arrival. For doctor visits, patients may have 1 support person aged 50 or older with them. For treatment visits, patients cannot have anyone with them due to current Covid guidelines and our immunocompromised population.  ?

## 2021-03-20 NOTE — Progress Notes (Signed)
Vitals reviewed with MD. Per MD to proceed with treatment today. Pt and treatment team updated.  ? ?Albert Giles  ?

## 2021-03-20 NOTE — Progress Notes (Signed)
[facility] NOTE  Patient Care Team: [name], MD as PCP - General (Family Medicine) Clent Jacks, RN as Oncology Nurse Navigator  CHIEF COMPLAINTS/PURPOSE OF CONSULTATION: colon/rectal cancer  #  Oncology History Overview Note  # Malignant partially obstructing tumor in the transverse colon/70 cm proximal to the anus- C. COLON MASS, 70 CM; COLD BIOPSY:  - INTRAMUCOSAL ADENOCARCINOMA AT LEAST;  # One 20 mm polyp in the transverse colon, removed with mucosal resection. Resected and retrieved. Clips were placed. Tattooed.  # tumor in the mid rectum and at 10 cm proximal to the anus. Biopsied.      SEE COMMENT.   Comment:  There is no definitive evidence of invasion in this sample.  The  findings may not accurately represent the entire underlying lesion;  clinical correlation is recommended.   D. COLON POLYP, TRANSVERSE; HOT SNARE:  - TUBULOVILLOUS ADENOMA.  - NEGATIVE FOR HIGH GRADE DYSPLASIA AND MALIGNANCY.   E. RECTUM MASS; COLD BIOPSY:  - INVASIVE ADENOCARCINOMA, MODERATELY TO POORLY DIFFERENTIATED. ----------------------------------   # PET scan: SEP 2022-proximal right colon [adjacent mesenteric lymph nodes] and rectal hypermetabolism.  Additional subcentimeter bilateral hypermetabolic lymphadenopathy noted in the retroperitoneal/left external iliac; inferior right lobe of the liver concerning for metastatic disease; right lower quadrant soft tissue mass concerning for peritoneal carcinomatosis; bilateral solid pulmonary nodules ~5 mm; MRI rectum- T stage: T4a; N stage:  N1.   # 09/12-2020- FOLFOX chemo [Dr.White/Dr.Vanga]; ADDED BEV with cycle #3-DC 5-FU bolus+LV; # 6-oxaliplatin dose reduced by 20%[thrombocytopenia];  starting cycle #13-will discontinue oxaliplatin [given PN-G-12;/thrombocytopenia]   Rectal cancer ([place])  09/11/2020 Initial Diagnosis   Rectal cancer ([place])   09/25/2020 -  Chemotherapy   Patient is on Treatment Plan :  COLORECTAL FOLFOX q14d x 4 months     09/28/2020 Cancer Staging   Staging form: Colon and Rectum, AJCC 8th Edition - Clinical: Stage IVC (cT4a, cN1, cM1c) - Signed by Cammie Sickle, MD on 09/28/2020     Genetic Testing   Negative genetic testing. No pathogenic variants identified on the [facility] Multi-Cancer+RNA Panel. The report date is 11/05/2020.  The Multi-Cancer Panel + RNA offered by [facility] includes sequencing and/or deletion duplication testing of the following 84 genes: AIP, ALK, APC, ATM, AXIN2,BAP1,  BARD1, BLM, BMPR1A, BRCA1, BRCA2, BRIP1, CASR, CDC73, CDH1, CDK4, CDKN1B, CDKN1C, CDKN2A (p14ARF), CDKN2A (p16INK4a), CEBPA, CHEK2, CTNNA1, DICER1, DIS3L2, EGFR (c.2369C>T, p.Thr790Met variant only), EPCAM (Deletion/duplication testing only), FH, FLCN, GATA2, GPC3, GREM1 (Promoter region deletion/duplication testing only), HOXB13 (c.251G>A, p.Gly84Glu), HRAS, KIT, MAX, MEN1, MET, MITF (c.952G>A, p.Glu318Lys variant only), MLH1, MSH2, MSH3, MSH6, MUTYH, NBN, NF1, NF2, NTHL1, PALB2, PDGFRA, PHOX2B, PMS2, POLD1, POLE, POT1, PRKAR1A, PTCH1, PTEN, RAD50, RAD51C, RAD51D, RB1, RECQL4, RET, RUNX1, SDHAF2, SDHA (sequence changes only), SDHB, SDHC, SDHD, SMAD4, SMARCA4, SMARCB1, SMARCE1, STK11, SUFU, TERC, TERT, TMEM127, TP53, TSC1, TSC2, VHL, WRN and WT1.      HISTORY OF PRESENTING ILLNESS: Ambulating independently.  Accompanied by daughter.  [name] 76 y.o.  male synchronous colon cancer/rectal cancer-stage IV-on FOLFOX chemotherapy is here for follow-up review results of the restaging CAT scan.  Patient denies any nausea vomiting.  Admits to tingling and numbness of his extremities-which makes him difficult for him to pick up stuff.  No fever no chills no headaches.  Intermittent mild nosebleeds.  Review of Systems  Constitutional:  Negative for chills, diaphoresis, fever and weight loss.  HENT:  Negative for nosebleeds and sore throat.   Eyes:  Negative for  double vision.   Respiratory:  Negative for cough, hemoptysis, sputum production, shortness of breath and wheezing.   Cardiovascular:  Negative for chest pain, palpitations, orthopnea and leg swelling.  Gastrointestinal:  Negative for abdominal pain, blood in stool, constipation, diarrhea, heartburn, melena, nausea and vomiting.  Genitourinary:  Negative for dysuria, frequency and urgency.  Skin: Negative.  Negative for itching and rash.  Neurological:  Positive for tingling and sensory change. Negative for dizziness, focal weakness, weakness and headaches.  Endo/Heme/Allergies:  Does not bruise/bleed easily.  Psychiatric/Behavioral:  Negative for depression. The patient is not nervous/anxious and does not have insomnia.     MEDICAL HISTORY:  Past Medical History:  Diagnosis Date   CAD (coronary artery disease) 06/2014   UA with NSTEMI - cath with 99% prox L circ s/p stent, EF 40% ([place], [place] [facility])   Emphysema    mild   Ex-smoker    Family history of breast cancer    Family history of colon cancer    Family history of pancreatic cancer    NSTEMI (non-ST elevated myocardial infarction) ([place]) 07/13/2014    SURGICAL HISTORY: Past Surgical History:  Procedure Laterality Date   CARDIAC CATHETERIZATION N/A 07/14/2014   Left Heart Cath and Coronary Angiography with stent placement;  Surgeon: Teodoro Spray, MD   CARDIAC CATHETERIZATION N/A 07/14/2014   Coronary Stent Intervention;  Surgeon: Yolonda Kida, MD   COLONOSCOPY WITH PROPOFOL N/A 09/06/2020   Procedure: COLONOSCOPY WITH PROPOFOL;  Surgeon: Lin Landsman, MD;  Location: [facility] ENDOSCOPY;  Service: Gastroenterology;  Laterality: N/A;   CYSTECTOMY     on back   ESOPHAGOGASTRODUODENOSCOPY N/A 09/06/2020   Procedure: ESOPHAGOGASTRODUODENOSCOPY (EGD);  Surgeon: Lin Landsman, MD;  Location: [facility] ENDOSCOPY;  Service: Gastroenterology;  Laterality: N/A;   INGUINAL HERNIA REPAIR Right 08/17/04   IR IMAGING GUIDED PORT INSERTION   09/13/2020    SOCIAL HISTORY: Social History   Socioeconomic History   Marital status: Married    Spouse name: Not on file   Number of children: Not on file   Years of education: Not on file   Highest education level: Not on file  Occupational History   Not on file  Tobacco Use   Smoking status: Former    Packs/day: 1.00    Years: 35.00    Pack years: 35.00    Types: Cigarettes    Quit date: 07/07/1998    Years since quitting: 22.7   Smokeless tobacco: Never  Vaping Use   Vaping Use: Never used  Substance and Sexual Activity   Alcohol use: No   Drug use: No   Sexual activity: Yes  Other Topics Concern   Not on file  Social History Narrative   Caffeine: 2 cups coffee, 2 cups tea/day   Lives with wife   Occupation: Higher education careers adviser, retired   Edu: HS   Activity: works in yard, walking   Diet: some water, fruits/vegetables daily   -------------------------------------------------------------------       Shea Stakes Donovan; [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    Social Determinants of Health   Financial Resource Strain: Low Risk    Difficulty of Paying Living Expenses: Not hard at all  Food Insecurity: No Food Insecurity   Worried About Charity fundraiser in the Last Year: Never true   Ran Out of Food in the Last Year: Never true  Transportation Needs: No Transportation Needs   Lack  of Transportation (Medical): No   Lack of Transportation (Non-Medical): No  Physical Activity: Inactive   Days of Exercise per Week: 0 days   Minutes of Exercise per Session: 0 min  Stress: No Stress Concern Present   Feeling of Stress : Not at all  Social Connections: Not on file  Intimate Partner Violence: Not At Risk   Fear of Current or Ex-Partner: No   Emotionally Abused: No   Physically Abused: No   Sexually Abused: No    FAMILY HISTORY: Family History  Problem Relation Age of Onset   Cancer Mother 58        colon   Cancer Sister        breast   Crohn's disease Sister    Cancer Daughter        anal   Crohn's disease Niece    CAD Neg Hx    Stroke Neg Hx    Diabetes Neg Hx    Prostate cancer Neg Hx    Kidney cancer Neg Hx    Bladder Cancer Neg Hx     ALLERGIES:  has No Known Allergies.  MEDICATIONS:  Current Outpatient Medications  Medication Sig Dispense Refill   aspirin EC 81 MG tablet Take 1 tablet (81 mg total) by mouth daily. 60 tablet 1   atorvastatin (LIPITOR) 40 MG tablet Take 1 tablet (40 mg total) by mouth daily at 6 PM. 60 tablet 1   Cholecalciferol (VITAMIN D3) 25 MCG (1000 UT) CAPS Take 1 capsule (1,000 Units total) by mouth daily. 30 capsule    clopidogrel (PLAVIX) 75 MG tablet Take 1 tablet (75 mg total) by mouth daily. 60 tablet 1   Cyanocobalamin (B-12) 1000 MCG SUBL Place 1 tablet under the tongue daily.     lidocaine-prilocaine (EMLA) cream Apply 1 application topically as needed (to port a cath 1 hour prior to each chemotherapy). 30 g 3   metoprolol tartrate (LOPRESSOR) 25 MG tablet Take 1 tablet (25 mg total) by mouth 2 (two) times daily. 60 tablet 1   ondansetron (ZOFRAN) 8 MG tablet Take 1 tablet (8 mg total) by mouth every 8 (eight) hours as needed for nausea or vomiting. 20 tablet 3   pantoprazole (PROTONIX) 40 MG tablet TAKE 1 TABLET BY MOUTH TWICE DAILY BEFORE A MEAL 180 tablet 0   prochlorperazine (COMPAZINE) 10 MG tablet Take 1 tablet (10 mg total) by mouth every 6 (six) hours as needed for nausea or vomiting. 30 tablet 3   tadalafil (CIALIS) 20 MG tablet TAKE ONE TABLET BY MOUTH DAILY AS NEEDED FOR ERECTILE DYSFUNCTION 20 tablet 3   ferrous sulfate 324 (65 Fe) MG TBEC Take 1 tablet (325 mg total) by mouth every other day.     No current facility-administered medications for this visit.   Facility-Administered Medications Ordered in Other Visits  Medication Dose Route Frequency Provider Last Rate Last Admin   heparin lock flush 100 unit/mL  500 Units  Intracatheter Once PRN Charlaine Dalton R, MD       sodium chloride flush (NS) 0.9 % injection 10 mL  10 mL Intravenous PRN Charlaine Dalton R, MD   10 mL at 10/09/20 0855   sodium chloride flush (NS) 0.9 % injection 10 mL  10 mL Intracatheter PRN Cammie Sickle, MD          .  PHYSICAL EXAMINATION: ECOG PERFORMANCE STATUS: 0 - Asymptomatic  Vitals:   03/20/21 0826  BP: (!) 175/97  Pulse: 66  Temp: Marland Kitchen)  96.7 F (35.9 C)  SpO2: 97%    Filed Weights   03/20/21 0826  Weight: 165 lb 6.4 oz (75 kg)     Physical Exam Vitals and nursing note reviewed.  HENT:     Head: Normocephalic and atraumatic.     Mouth/Throat:     Pharynx: Oropharynx is clear.  Eyes:     Extraocular Movements: Extraocular movements intact.     Pupils: Pupils are equal, round, and reactive to light.  Cardiovascular:     Rate and Rhythm: Normal rate and regular rhythm.  Pulmonary:     Comments: Decreased breath sounds bilaterally.  Abdominal:     Palpations: Abdomen is soft.  Musculoskeletal:        General: Normal range of motion.     Cervical back: Normal range of motion.  Skin:    General: Skin is warm.  Neurological:     General: No focal deficit present.     Mental Status: He is alert and oriented to person, place, and time.  Psychiatric:        Behavior: Behavior normal.        Judgment: Judgment normal.     LABORATORY DATA:  I have reviewed the data as listed Lab Results  Component Value Date   WBC 4.1 03/20/2021   HGB 13.6 03/20/2021   HCT 41.8 03/20/2021   MCV 101.0 (H) 03/20/2021   PLT 84 (L) 03/20/2021   Recent Labs    02/19/21 0811 03/05/21 0832 03/20/21 0802  NA 136 135 138  K 3.9 4.0 3.8  CL 106 106 108  CO2 24 23 23   GLUCOSE 124* 121* 137*  BUN 12 14 16   CREATININE 1.37* 1.45* 1.63*  CALCIUM 8.6* 8.6* 8.9  GFRNONAA 53* 50* 43*  PROT 6.8 6.6 6.8  ALBUMIN 3.6 3.5 3.5  AST 42* 31 34  ALT 39 28 28  ALKPHOS 175* 171* 199*  BILITOT 0.7 0.5 0.8     RADIOGRAPHIC STUDIES: I have personally reviewed the radiological images as listed and agreed with the findings in the report. CT ABDOMEN PELVIS W CONTRAST  Result Date: 03/16/2021 CLINICAL DATA:  Colon cancer EXAM: CT ABDOMEN AND PELVIS WITH CONTRAST TECHNIQUE: Multidetector CT imaging of the abdomen and pelvis was performed using the standard protocol following bolus administration of intravenous contrast. RADIATION DOSE REDUCTION: This exam was performed according to the departmental dose-optimization program which includes automated exposure control, adjustment of the mA and/or kV according to patient size and/or use of iterative reconstruction technique. CONTRAST:  52m OMNIPAQUE IOHEXOL 300 MG/ML  SOLN COMPARISON:  CT chest, abdomen and pelvis dated December 27, 2020 FINDINGS: Lower chest: No acute abnormality. Hepatobiliary: No focal liver abnormality is seen. No gallstones, gallbladder wall thickening, or biliary dilatation. Pancreas: Unremarkable. No pancreatic ductal dilatation or surrounding inflammatory changes. Spleen: Normal in size without focal abnormality. Adrenals/Urinary Tract: Bilateral adrenal glands are unremarkable. No hydronephrosis or nephrolithiasis. Simple cyst of the anterior right kidney. Bladder wall thickening. Stomach/Bowel: Stomach is within normal limits. Appendix appears normal. Wall thickening of the cecum, similar to prior exam. No evidence of obstruction. Vascular/Lymphatic: Aortic atherosclerosis. No enlarged abdominal or pelvic lymph nodes. Reproductive: Mild prostatomegaly. Other: Decreased size of a mental soft tissue nodule measuring 1.1 x 0.6 cm on series 2, image 36, previously 1.6 x 1.2 cm. No abdominal wall hernia or abnormality. No abdominopelvic ascites. Musculoskeletal: No acute or significant osseous findings. IMPRESSION: 1. Unchanged mild wall thickening of the right colon. 2. Decreased size  of omental soft tissue nodule. 3. No evidence new or progressive  disease within the chest, abdomen or pelvis. 4.  Aortic Atherosclerosis (ICD10-I70.0). Electronically Signed   By: Yetta Glassman M.D.   On: 03/16/2021 08:30    ASSESSMENT & PLAN:   Rectal cancer ([place])  # STAGE IV- Synchronous primaries a] rectal cancer-10 cm-adenocarcinoma & b] transverse colon-- intramucosal adenoca]; abdominal lymphadenopathy; liver metastases; omental metastases.  S/p 12 cycles of FOLFOX plus Bev- CT scan -MARCH 2nd, 2023- Decreased size of a mental soft tissue nodule measuring 1.1 x 0.6 cm [previously- 1.6 x 1.2 cm.    # proceed with FOLFOX #13  Today; will discontinue oxaliplatin [given PN/thrombocytopenia] continue BEV [see below]  Labs today reviewed;  acceptable for treatment today.   # Anemia/iron deficiency hemoglobin ~13-  secondary to chronic GI bleeding/tumor-STABLE  # Thrombocytopenia/secondary chemotherapy/on Plavix-aspirin-platelets-77- [decreased oxaliplatin dose by 20% with #6 cycle]-  -slightly worsened/ mild nose bleeds-? Avastin. Will discontinue oxaliplatin [starting #13 cycle ]   # HTN- 171/95; at home BP reviewed- 130-150/ DBP80-90s.continue checking BP at home/reviewed the log from home.  Proceed with avastin today. STABLE.   # PN G-1-2- sec to Oxaliplatin. Will HOLDoxaliplatin for now.   # Chronic kidney disease stage III-[GFR-43 ] - STABLE. Keep hydration.    # DISPOSITION:   Re-check BP # 5FU+Bev today; discontinued oxaliplatin # Follow up in 2 weeks- MD: labs- cbc/cmp;UA CEA;  5FU+ bev; Pump off in 2 days- Dr.B;       All questions were answered. The patient knows to call the clinic with any problems, questions or concerns.    Cammie Sickle, MD 03/20/2021 9:24 AM

## 2021-03-21 LAB — CEA: CEA: 3.7 ng/mL (ref 0.0–4.7)

## 2021-03-22 ENCOUNTER — Other Ambulatory Visit: Payer: Self-pay

## 2021-03-22 ENCOUNTER — Inpatient Hospital Stay: Payer: Medicare HMO

## 2021-03-22 VITALS — BP 152/82 | HR 76 | Resp 18

## 2021-03-22 DIAGNOSIS — D509 Iron deficiency anemia, unspecified: Secondary | ICD-10-CM | POA: Diagnosis not present

## 2021-03-22 DIAGNOSIS — I129 Hypertensive chronic kidney disease with stage 1 through stage 4 chronic kidney disease, or unspecified chronic kidney disease: Secondary | ICD-10-CM | POA: Diagnosis not present

## 2021-03-22 DIAGNOSIS — Z5112 Encounter for antineoplastic immunotherapy: Secondary | ICD-10-CM | POA: Diagnosis not present

## 2021-03-22 DIAGNOSIS — C786 Secondary malignant neoplasm of retroperitoneum and peritoneum: Secondary | ICD-10-CM | POA: Diagnosis not present

## 2021-03-22 DIAGNOSIS — Z5111 Encounter for antineoplastic chemotherapy: Secondary | ICD-10-CM | POA: Diagnosis not present

## 2021-03-22 DIAGNOSIS — C20 Malignant neoplasm of rectum: Secondary | ICD-10-CM | POA: Diagnosis not present

## 2021-03-22 DIAGNOSIS — I10 Essential (primary) hypertension: Secondary | ICD-10-CM | POA: Diagnosis not present

## 2021-03-22 DIAGNOSIS — D696 Thrombocytopenia, unspecified: Secondary | ICD-10-CM | POA: Diagnosis not present

## 2021-03-22 DIAGNOSIS — Z87891 Personal history of nicotine dependence: Secondary | ICD-10-CM | POA: Diagnosis not present

## 2021-03-22 DIAGNOSIS — C787 Secondary malignant neoplasm of liver and intrahepatic bile duct: Secondary | ICD-10-CM | POA: Diagnosis not present

## 2021-03-22 MED ORDER — HEPARIN SOD (PORK) LOCK FLUSH 100 UNIT/ML IV SOLN
500.0000 [IU] | Freq: Once | INTRAVENOUS | Status: AC | PRN
  Administered 2021-03-22: 13:00:00 500 [IU]
  Filled 2021-03-22: qty 5

## 2021-03-22 MED ORDER — SODIUM CHLORIDE 0.9% FLUSH
10.0000 mL | INTRAVENOUS | Status: DC | PRN
  Administered 2021-03-22: 13:00:00 10 mL
  Filled 2021-03-22: qty 10

## 2021-03-25 DIAGNOSIS — C20 Malignant neoplasm of rectum: Secondary | ICD-10-CM | POA: Diagnosis not present

## 2021-04-03 ENCOUNTER — Inpatient Hospital Stay: Payer: Medicare HMO

## 2021-04-03 ENCOUNTER — Inpatient Hospital Stay (HOSPITAL_BASED_OUTPATIENT_CLINIC_OR_DEPARTMENT_OTHER): Payer: Medicare HMO | Admitting: Nurse Practitioner

## 2021-04-03 ENCOUNTER — Encounter: Payer: Self-pay | Admitting: Nurse Practitioner

## 2021-04-03 ENCOUNTER — Other Ambulatory Visit: Payer: Self-pay

## 2021-04-03 VITALS — BP 171/96 | HR 64 | Temp 96.1°F | Ht 68.0 in | Wt 166.0 lb

## 2021-04-03 DIAGNOSIS — D6959 Other secondary thrombocytopenia: Secondary | ICD-10-CM | POA: Diagnosis not present

## 2021-04-03 DIAGNOSIS — C787 Secondary malignant neoplasm of liver and intrahepatic bile duct: Secondary | ICD-10-CM | POA: Diagnosis not present

## 2021-04-03 DIAGNOSIS — I129 Hypertensive chronic kidney disease with stage 1 through stage 4 chronic kidney disease, or unspecified chronic kidney disease: Secondary | ICD-10-CM | POA: Diagnosis not present

## 2021-04-03 DIAGNOSIS — Z87891 Personal history of nicotine dependence: Secondary | ICD-10-CM | POA: Diagnosis not present

## 2021-04-03 DIAGNOSIS — G620 Drug-induced polyneuropathy: Secondary | ICD-10-CM | POA: Insufficient documentation

## 2021-04-03 DIAGNOSIS — C20 Malignant neoplasm of rectum: Secondary | ICD-10-CM

## 2021-04-03 DIAGNOSIS — D696 Thrombocytopenia, unspecified: Secondary | ICD-10-CM | POA: Diagnosis not present

## 2021-04-03 DIAGNOSIS — T451X5A Adverse effect of antineoplastic and immunosuppressive drugs, initial encounter: Secondary | ICD-10-CM | POA: Diagnosis not present

## 2021-04-03 DIAGNOSIS — I10 Essential (primary) hypertension: Secondary | ICD-10-CM | POA: Diagnosis not present

## 2021-04-03 DIAGNOSIS — Z95828 Presence of other vascular implants and grafts: Secondary | ICD-10-CM

## 2021-04-03 DIAGNOSIS — Z5111 Encounter for antineoplastic chemotherapy: Secondary | ICD-10-CM

## 2021-04-03 DIAGNOSIS — C786 Secondary malignant neoplasm of retroperitoneum and peritoneum: Secondary | ICD-10-CM | POA: Diagnosis not present

## 2021-04-03 DIAGNOSIS — D509 Iron deficiency anemia, unspecified: Secondary | ICD-10-CM | POA: Diagnosis not present

## 2021-04-03 DIAGNOSIS — Z5112 Encounter for antineoplastic immunotherapy: Secondary | ICD-10-CM | POA: Diagnosis not present

## 2021-04-03 LAB — CBC WITH DIFFERENTIAL/PLATELET
Abs Immature Granulocytes: 0.03 10*3/uL (ref 0.00–0.07)
Basophils Absolute: 0.1 10*3/uL (ref 0.0–0.1)
Basophils Relative: 1 %
Eosinophils Absolute: 0.4 10*3/uL (ref 0.0–0.5)
Eosinophils Relative: 6 %
HCT: 43.7 % (ref 39.0–52.0)
Hemoglobin: 14.1 g/dL (ref 13.0–17.0)
Immature Granulocytes: 0 %
Lymphocytes Relative: 21 %
Lymphs Abs: 1.4 10*3/uL (ref 0.7–4.0)
MCH: 33.3 pg (ref 26.0–34.0)
MCHC: 32.3 g/dL (ref 30.0–36.0)
MCV: 103.1 fL — ABNORMAL HIGH (ref 80.0–100.0)
Monocytes Absolute: 0.9 10*3/uL (ref 0.1–1.0)
Monocytes Relative: 13 %
Neutro Abs: 4 10*3/uL (ref 1.7–7.7)
Neutrophils Relative %: 59 %
Platelets: 105 10*3/uL — ABNORMAL LOW (ref 150–400)
RBC: 4.24 MIL/uL (ref 4.22–5.81)
RDW: 15.9 % — ABNORMAL HIGH (ref 11.5–15.5)
WBC: 6.8 10*3/uL (ref 4.0–10.5)
nRBC: 0 % (ref 0.0–0.2)

## 2021-04-03 LAB — URINALYSIS, COMPLETE (UACMP) WITH MICROSCOPIC
Bacteria, UA: NONE SEEN
Bilirubin Urine: NEGATIVE
Glucose, UA: NEGATIVE mg/dL
Hgb urine dipstick: NEGATIVE
Ketones, ur: NEGATIVE mg/dL
Leukocytes,Ua: NEGATIVE
Nitrite: NEGATIVE
Protein, ur: NEGATIVE mg/dL
Specific Gravity, Urine: 1.018 (ref 1.005–1.030)
pH: 5 (ref 5.0–8.0)

## 2021-04-03 LAB — COMPREHENSIVE METABOLIC PANEL
ALT: 40 U/L (ref 0–44)
AST: 39 U/L (ref 15–41)
Albumin: 3.3 g/dL — ABNORMAL LOW (ref 3.5–5.0)
Alkaline Phosphatase: 230 U/L — ABNORMAL HIGH (ref 38–126)
Anion gap: 6 (ref 5–15)
BUN: 12 mg/dL (ref 8–23)
CO2: 24 mmol/L (ref 22–32)
Calcium: 8.6 mg/dL — ABNORMAL LOW (ref 8.9–10.3)
Chloride: 106 mmol/L (ref 98–111)
Creatinine, Ser: 1.51 mg/dL — ABNORMAL HIGH (ref 0.61–1.24)
GFR, Estimated: 47 mL/min — ABNORMAL LOW (ref >60)
Glucose, Bld: 104 mg/dL — ABNORMAL HIGH (ref 70–99)
Potassium: 4.2 mmol/L (ref 3.5–5.1)
Sodium: 136 mmol/L (ref 135–145)
Total Bilirubin: 0.6 mg/dL (ref 0.3–1.2)
Total Protein: 6.7 g/dL (ref 6.5–8.1)

## 2021-04-03 MED ORDER — SODIUM CHLORIDE 0.9 % IV SOLN
10.0000 mg/kg | INTRAVENOUS | Status: DC
  Administered 2021-04-03: 800 mg via INTRAVENOUS
  Filled 2021-04-03: qty 32

## 2021-04-03 MED ORDER — SODIUM CHLORIDE 0.9 % IV SOLN
10.0000 mg | Freq: Once | INTRAVENOUS | Status: AC
  Administered 2021-04-03: 10 mg via INTRAVENOUS
  Filled 2021-04-03: qty 10

## 2021-04-03 MED ORDER — SODIUM CHLORIDE 0.9% FLUSH
10.0000 mL | INTRAVENOUS | Status: DC | PRN
  Filled 2021-04-03: qty 10

## 2021-04-03 MED ORDER — SODIUM CHLORIDE 0.9 % IV SOLN
INTRAVENOUS | Status: DC
  Filled 2021-04-03: qty 250

## 2021-04-03 MED ORDER — SODIUM CHLORIDE 0.9 % IV SOLN
2400.0000 mg/m2 | INTRAVENOUS | Status: DC
  Administered 2021-04-03: 4650 mg via INTRAVENOUS
  Filled 2021-04-03: qty 93

## 2021-04-03 MED ORDER — SODIUM CHLORIDE 0.9% FLUSH
10.0000 mL | Freq: Once | INTRAVENOUS | Status: AC
  Administered 2021-04-03: 10 mL via INTRAVENOUS
  Filled 2021-04-03: qty 10

## 2021-04-03 NOTE — Progress Notes (Signed)
Having some tingling in his fingers. ? ?

## 2021-04-03 NOTE — Patient Instructions (Signed)
[facility] AT [place]  Discharge Instructions: ?Thank you for choosing [facility] to provide your oncology and hematology care.  ?If you have a lab appointment with the [facility], please go directly to the [facility] and check in at the registration area. ? ?Wear comfortable clothing and clothing appropriate for easy access to any Portacath or PICC line.  ? ?We strive to give you quality time with your provider. You may need to reschedule your appointment if you arrive late (15 or more minutes).  Arriving late affects you and other patients whose appointments are after yours.  Also, if you miss three or more appointments without notifying the office, you may be dismissed from the clinic at the provider?s discretion.    ?  ?For prescription refill requests, have your pharmacy contact our office and allow 72 hours for refills to be completed.   ? ?Today you received the following chemotherapy and/or immunotherapy agents: Mvasi, fluorouracil    ?  ?To help prevent nausea and vomiting after your treatment, we encourage you to take your nausea medication as directed. ? ?BELOW ARE SYMPTOMS THAT SHOULD BE REPORTED IMMEDIATELY: ?*FEVER GREATER THAN 100.4 F (38 ?C) OR HIGHER ?*CHILLS OR SWEATING ?*NAUSEA AND VOMITING THAT IS NOT CONTROLLED WITH YOUR NAUSEA MEDICATION ?*UNUSUAL SHORTNESS OF BREATH ?*UNUSUAL BRUISING OR BLEEDING ?*URINARY PROBLEMS (pain or burning when urinating, or frequent urination) ?*BOWEL PROBLEMS (unusual diarrhea, constipation, pain near the anus) ?TENDERNESS IN MOUTH AND THROAT WITH OR WITHOUT PRESENCE OF ULCERS (sore throat, sores in mouth, or a toothache) ?UNUSUAL RASH, SWELLING OR PAIN  ?UNUSUAL VAGINAL DISCHARGE OR ITCHING  ? ?Items with * indicate a potential emergency and should be followed up as soon as possible or go to the Emergency Department if any problems should occur. ? ?Please show the CHEMOTHERAPY ALERT CARD or IMMUNOTHERAPY ALERT CARD at  check-in to the Emergency Department and triage nurse. ? ?Should you have questions after your visit or need to cancel or reschedule your appointment, please contact [facility] CANCER [place] AT [place]  [phone] and follow the prompts.  Office hours are 8:00 a.m. to 4:30 p.m. Monday - Friday. Please note that voicemails left after 4:00 p.m. may not be returned until the following business day.  We are closed weekends and major holidays. You have access to a nurse at all times for urgent questions. Please call the main number to the clinic [phone] and follow the prompts. ? ?For any non-urgent questions, you may also contact your provider using MyChart. We now offer e-Visits for anyone 79 and older to request care online for non-urgent symptoms. For details visit [email or web address]. ?  ?Also download the MyChart app! Go to the app store, search "MyChart", open the app, select [facility], and log in with your MyChart username and password. ? ?Due to Covid, a mask is required upon entering the hospital/clinic. If you do not have a mask, one will be given to you upon arrival. For doctor visits, patients may have 1 support person aged 26 or older with them. For treatment visits, patients cannot have anyone with them due to current Covid guidelines and our immunocompromised population. Fluorouracil, 5-FU injection ?What is this medication? ?FLUOROURACIL, 5-FU (flure oh YOOR a sil) is a chemotherapy drug. It slows the growth of cancer cells. This medicine is used to treat many types of cancer like breast cancer, colon or rectal cancer, pancreatic cancer, and stomach cancer. ?This medicine may be used for other purposes; ask  your health care provider or pharmacist if you have questions. ?COMMON BRAND NAME(S): Adrucil ?What should I tell my care team before I take this medication? ?They need to know if you have any of these conditions: ?blood disorders ?dihydropyrimidine dehydrogenase (DPD)  deficiency ?infection (especially a virus infection such as chickenpox, cold sores, or herpes) ?kidney disease ?liver disease ?malnourished, poor nutrition ?recent or ongoing radiation therapy ?an unusual or allergic reaction to fluorouracil, other chemotherapy, other medicines, foods, dyes, or preservatives ?pregnant or trying to get pregnant ?breast-feeding ?How should I use this medication? ?This drug is given as an infusion or injection into a vein. It is administered in a hospital or clinic by a specially trained health care professional. ?Talk to your pediatrician regarding the use of this medicine in children. Special care may be needed. ?Overdosage: If you think you have taken too much of this medicine contact a poison control center or emergency room at once. ?NOTE: This medicine is only for you. Do not share this medicine with others. ?What if I miss a dose? ?It is important not to miss your dose. Call your doctor or health care professional if you are unable to keep an appointment. ?What may interact with this medication? ?Do not take this medicine with any of the following medications: ?live virus vaccines ?This medicine may also interact with the following medications: ?medicines that treat or prevent blood clots like warfarin, enoxaparin, and dalteparin ?This list may not describe all possible interactions. Give your health care provider a list of all the medicines, herbs, non-prescription drugs, or dietary supplements you use. Also tell them if you smoke, drink alcohol, or use illegal drugs. Some items may interact with your medicine. ?What should I watch for while using this medication? ?Visit your doctor for checks on your progress. This drug may make you feel generally unwell. This is not uncommon, as chemotherapy can affect healthy cells as well as cancer cells. Report any side effects. Continue your course of treatment even though you feel ill unless your doctor tells you to stop. ?In some cases,  you may be given additional medicines to help with side effects. Follow all directions for their use. ?Call your doctor or health care professional for advice if you get a fever, chills or sore throat, or other symptoms of a cold or flu. Do not treat yourself. This drug decreases your body's ability to fight infections. Try to avoid being around people who are sick. ?This medicine may increase your risk to bruise or bleed. Call your doctor or health care professional if you notice any unusual bleeding. ?Be careful brushing and flossing your teeth or using a toothpick because you may get an infection or bleed more easily. If you have any dental work done, tell your dentist you are receiving this medicine. ?Avoid taking products that contain aspirin, acetaminophen, ibuprofen, naproxen, or ketoprofen unless instructed by your doctor. These medicines may hide a fever. ?Do not become pregnant while taking this medicine. Women should inform their doctor if they wish to become pregnant or think they might be pregnant. There is a potential for serious side effects to an unborn child. Talk to your health care professional or pharmacist for more information. Do not breast-feed an infant while taking this medicine. ?Men should inform their doctor if they wish to father a child. This medicine may lower sperm counts. ?Do not treat diarrhea with over the counter products. Contact your doctor if you have diarrhea that lasts more than 2 days  or if it is severe and watery. ?This medicine can make you more sensitive to the sun. Keep out of the sun. If you cannot avoid being in the sun, wear protective clothing and use sunscreen. Do not use sun lamps or tanning beds/booths. ?What side effects may I notice from receiving this medication? ?Side effects that you should report to your doctor or health care professional as soon as possible: ?allergic reactions like skin rash, itching or hives, swelling of the face, lips, or tongue ?low  blood counts - this medicine may decrease the number of white blood cells, red blood cells and platelets. You may be at increased risk for infections and bleeding. ?signs of infection - fever or chills, cough, sore

## 2021-04-03 NOTE — Progress Notes (Signed)
[place] ?CONSULT NOTE ? ?Patient Care Team: ?[name], MD as PCP - General (Family Medicine) ?Clent Jacks, RN as Oncology Nurse Navigator ? ?CHIEF COMPLAINTS/PURPOSE OF CONSULTATION: colon/rectal cancer ? ?#  ?Oncology History Overview Note  ?# Malignant partially obstructing tumor in the transverse colon/70 cm proximal to the anus- C. COLON MASS, 70 CM; COLD BIOPSY:  ?- INTRAMUCOSAL ADENOCARCINOMA AT LEAST; ? ?# One 20 mm polyp in the transverse colon, removed with mucosal resection. Resected and retrieved. Clips were ?placed. Tattooed. ? ?# tumor in the mid rectum and at 10 cm proximal to the anus. Biopsied. ? ? ? ? ? SEE COMMENT.  ? ?Comment:  ?There is no definitive evidence of invasion in this sample.  The  ?findings may not accurately represent the entire underlying lesion;  ?clinical correlation is recommended.  ? ?D. COLON POLYP, TRANSVERSE; HOT SNARE:  ?- TUBULOVILLOUS ADENOMA.  ?- NEGATIVE FOR HIGH GRADE DYSPLASIA AND MALIGNANCY.  ? ?E. RECTUM MASS; COLD BIOPSY:  ?- INVASIVE ADENOCARCINOMA, MODERATELY TO POORLY DIFFERENTIATED. ?----------------------------------   ?# PET scan: SEP 2022-proximal right colon [adjacent mesenteric lymph nodes] and rectal hypermetabolism.  Additional subcentimeter bilateral hypermetabolic lymphadenopathy noted in the retroperitoneal/left external iliac; inferior right lobe of the liver concerning for metastatic disease; right lower quadrant soft tissue mass concerning for peritoneal carcinomatosis; bilateral solid pulmonary nodules ~5 mm; MRI rectum- T stage: T4a; N stage:  N1.  ? ?# 09/12-2020- FOLFOX chemo [Dr.White/Dr.Vanga]; ADDED BEV with cycle #3-DC 5-FU bolus+LV; # 6-oxaliplatin dose reduced by 20%[thrombocytopenia];  starting cycle #13-will discontinue oxaliplatin [given PN-G-12;/thrombocytopenia] ?  ?Rectal cancer ([place])  ?09/11/2020 Initial Diagnosis  ? Rectal cancer [facility]) ?  ?09/25/2020 -  Chemotherapy  ? Patient is on Treatment Plan :  COLORECTAL FOLFOX q14d x 4 months  ?   ?09/28/2020 Cancer Staging  ? Staging form: Colon and Rectum, AJCC 8th Edition ?- Clinical: Stage IVC (cT4a, cN1, cM1c) - Signed by Cammie Sickle, MD on 09/28/2020 ?  ? Genetic Testing  ? Negative genetic testing. No pathogenic variants identified on the [facility] Multi-Cancer+RNA Panel. The report date is 11/05/2020. ? ?The Multi-Cancer Panel + RNA offered by [facility] includes sequencing and/or deletion duplication testing of the following 84 genes: AIP, ALK, APC, ATM, AXIN2,BAP1,  BARD1, BLM, BMPR1A, BRCA1, BRCA2, BRIP1, CASR, CDC73, CDH1, CDK4, CDKN1B, CDKN1C, CDKN2A (p14ARF), CDKN2A (p16INK4a), CEBPA, CHEK2, CTNNA1, DICER1, DIS3L2, EGFR (c.2369C>T, p.Thr790Met variant only), EPCAM (Deletion/duplication testing only), FH, FLCN, GATA2, GPC3, GREM1 (Promoter region deletion/duplication testing only), HOXB13 (c.251G>A, p.Gly84Glu), HRAS, KIT, MAX, MEN1, MET, MITF (c.952G>A, p.Glu318Lys variant only), MLH1, MSH2, MSH3, MSH6, MUTYH, NBN, NF1, NF2, NTHL1, PALB2, PDGFRA, PHOX2B, PMS2, POLD1, POLE, POT1, PRKAR1A, PTCH1, PTEN, RAD50, RAD51C, RAD51D, RB1, RECQL4, RET, RUNX1, SDHAF2, SDHA (sequence changes only), SDHB, SDHC, SDHD, SMAD4, SMARCA4, SMARCB1, SMARCE1, STK11, SUFU, TERC, TERT, TMEM127, TP53, TSC1, TSC2, VHL, WRN and WT1. ?  ? ? ? ?HISTORY OF PRESENTING ILLNESS: Ambulating independently.  Accompanied by daughter. ? ?Albert Giles 76 y.o.  male synchronous colon cancer/rectal cancer-stage IV-on FOLFOX chemotherapy is here for consideration of chemotherapy.  ? ?Continues to have tingling in fingers and toes. Oxaliplatin was discontinued given peripheral neuropathy. Symptoms stable and not worsening. Nosebleeds have nearly resolved. Continues checking BP at home. Feels well and denies complaints. Accompanied by daughter, Lenna Sciara.  ? ? ?Review of Systems  ?Constitutional:  Negative for chills, diaphoresis, fever and weight loss.  ?HENT:  Negative for nosebleeds and sore  throat.   ?Eyes:  Negative  for double vision.  ?Respiratory:  Negative for cough, hemoptysis, sputum production, shortness of breath and wheezing.   ?Cardiovascular:  Negative for chest pain, palpitations, orthopnea and leg swelling.  ?Gastrointestinal:  Negative for abdominal pain, blood in stool, constipation, diarrhea, heartburn, melena, nausea and vomiting.  ?Genitourinary:  Negative for dysuria, frequency and urgency.  ?Skin: Negative.  Negative for itching and rash.  ?Neurological:  Positive for tingling and sensory change. Negative for dizziness, focal weakness, weakness and headaches.  ?Endo/Heme/Allergies:  Does not bruise/bleed easily.  ?Psychiatric/Behavioral:  Negative for depression. The patient is not nervous/anxious and does not have insomnia.    ? ?MEDICAL HISTORY:  ?Past Medical History:  ?Diagnosis Date  ? CAD (coronary artery disease) 06/2014  ? UA with NSTEMI - cath with 99% prox L circ s/p stent, EF 40% ([facility], [place] at [facility])  ? Emphysema   ? mild  ? Ex-smoker   ? Family history of breast cancer   ? Family history of colon cancer   ? Family history of pancreatic cancer   ? NSTEMI (non-ST elevated myocardial infarction) ([place]) 07/13/2014  ? ? ?SURGICAL HISTORY: ?Past Surgical History:  ?Procedure Laterality Date  ? CARDIAC CATHETERIZATION N/A 07/14/2014  ? Left Heart Cath and Coronary Angiography with stent placement;  Surgeon: Teodoro Spray, MD  ? CARDIAC CATHETERIZATION N/A 07/14/2014  ? Coronary Stent Intervention;  Surgeon: Yolonda Kida, MD  ? COLONOSCOPY WITH PROPOFOL N/A 09/06/2020  ? Procedure: COLONOSCOPY WITH PROPOFOL;  Surgeon: Lin Landsman, MD;  Location: [facility] ENDOSCOPY;  Service: Gastroenterology;  Laterality: N/A;  ? CYSTECTOMY    ? on back  ? ESOPHAGOGASTRODUODENOSCOPY N/A 09/06/2020  ? Procedure: ESOPHAGOGASTRODUODENOSCOPY (EGD);  Surgeon: Lin Landsman, MD;  Location: [facility] ENDOSCOPY;  Service: Gastroenterology;  Laterality: N/A;  ? INGUINAL HERNIA REPAIR Right  08/17/04  ? IR IMAGING GUIDED PORT INSERTION  09/13/2020  ? ? ?SOCIAL HISTORY: ?Social History  ? ?Socioeconomic History  ? Marital status: Married  ?  Spouse name: Not on file  ? Number of children: Not on file  ? Years of education: Not on file  ? Highest education level: Not on file  ?Occupational History  ? Not on file  ?Tobacco Use  ? Smoking status: Former  ?  Packs/day: 1.00  ?  Years: 35.00  ?  Pack years: 35.00  ?  Types: Cigarettes  ?  Quit date: 07/07/1998  ?  Years since quitting: 22.7  ? Smokeless tobacco: Never  ?Vaping Use  ? Vaping Use: Never used  ?Substance and Sexual Activity  ? Alcohol use: No  ? Drug use: No  ? Sexual activity: Yes  ?Other Topics Concern  ? Not on file  ?Social History Narrative  ? Caffeine: 2 cups coffee, 2 cups tea/day  ? Lives with wife  ? Occupation: Higher education careers adviser, retired  ? Edu: HS  ? Activity: works in yard, walking  ? Diet: some water, fruits/vegetables daily  ? -------------------------------------------------------------------   ?   ? 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   ? ?Social Determinants of Health  ? ?Financial Resource Strain: Low Risk   ? Difficulty of Paying Living Expenses: Not hard at all  ?Food Insecurity: No Food Insecurity  ? Worried About Charity fundraiser in the Last Year: Never true  ? Ran Out of Food in the Last Year: Never true  ?Transportation Needs: No Transportation Needs  ? Lack  of Transportation (Medical): No  ? Lack of Transportation (Non-Medical): No  ?Physical Activity: Inactive  ? Days of Exercise per Week: 0 days  ? Minutes of Exercise per Session: 0 min  ?Stress: No Stress Concern Present  ? Feeling of Stress : Not at all  ?Social Connections: Not on file  ?Intimate Partner Violence: Not At Risk  ? Fear of Current or Ex-Partner: No  ? Emotionally Abused: No  ? Physically Abused: No  ? Sexually Abused: No  ? ? ?FAMILY HISTORY: ?Family History  ?Problem  Relation Age of Onset  ? Cancer Mother 50  ?     colon  ? Cancer Sister   ?     breast  ? Crohn's disease Sister   ? Cancer Daughter   ?     anal  ? Crohn's disease Niece   ? CAD Neg Hx   ? Stroke Neg Hx   ? Diabet

## 2021-04-04 LAB — CEA: CEA: 3.6 ng/mL (ref 0.0–4.7)

## 2021-04-05 ENCOUNTER — Inpatient Hospital Stay: Payer: Medicare HMO

## 2021-04-05 ENCOUNTER — Other Ambulatory Visit: Payer: Self-pay

## 2021-04-05 DIAGNOSIS — Z5112 Encounter for antineoplastic immunotherapy: Secondary | ICD-10-CM | POA: Diagnosis not present

## 2021-04-05 DIAGNOSIS — C20 Malignant neoplasm of rectum: Secondary | ICD-10-CM | POA: Diagnosis not present

## 2021-04-05 DIAGNOSIS — D696 Thrombocytopenia, unspecified: Secondary | ICD-10-CM | POA: Diagnosis not present

## 2021-04-05 DIAGNOSIS — D509 Iron deficiency anemia, unspecified: Secondary | ICD-10-CM | POA: Diagnosis not present

## 2021-04-05 DIAGNOSIS — C787 Secondary malignant neoplasm of liver and intrahepatic bile duct: Secondary | ICD-10-CM | POA: Diagnosis not present

## 2021-04-05 DIAGNOSIS — C786 Secondary malignant neoplasm of retroperitoneum and peritoneum: Secondary | ICD-10-CM | POA: Diagnosis not present

## 2021-04-05 DIAGNOSIS — Z87891 Personal history of nicotine dependence: Secondary | ICD-10-CM | POA: Diagnosis not present

## 2021-04-05 DIAGNOSIS — I129 Hypertensive chronic kidney disease with stage 1 through stage 4 chronic kidney disease, or unspecified chronic kidney disease: Secondary | ICD-10-CM | POA: Diagnosis not present

## 2021-04-05 DIAGNOSIS — Z5111 Encounter for antineoplastic chemotherapy: Secondary | ICD-10-CM | POA: Diagnosis not present

## 2021-04-05 DIAGNOSIS — I10 Essential (primary) hypertension: Secondary | ICD-10-CM | POA: Diagnosis not present

## 2021-04-05 MED ORDER — SODIUM CHLORIDE 0.9% FLUSH
10.0000 mL | INTRAVENOUS | Status: DC | PRN
  Administered 2021-04-05: 10 mL
  Filled 2021-04-05: qty 10

## 2021-04-05 MED ORDER — HEPARIN SOD (PORK) LOCK FLUSH 100 UNIT/ML IV SOLN
500.0000 [IU] | Freq: Once | INTRAVENOUS | Status: AC | PRN
  Administered 2021-04-05: 500 [IU]
  Filled 2021-04-05: qty 5

## 2021-04-09 DIAGNOSIS — I429 Cardiomyopathy, unspecified: Secondary | ICD-10-CM | POA: Diagnosis not present

## 2021-04-09 DIAGNOSIS — R0602 Shortness of breath: Secondary | ICD-10-CM | POA: Diagnosis not present

## 2021-04-17 ENCOUNTER — Encounter: Payer: Self-pay | Admitting: Nurse Practitioner

## 2021-04-17 ENCOUNTER — Inpatient Hospital Stay: Payer: Medicare HMO

## 2021-04-17 ENCOUNTER — Inpatient Hospital Stay: Payer: Medicare HMO | Attending: Internal Medicine

## 2021-04-17 ENCOUNTER — Other Ambulatory Visit: Payer: Self-pay | Admitting: Oncology

## 2021-04-17 ENCOUNTER — Inpatient Hospital Stay (HOSPITAL_BASED_OUTPATIENT_CLINIC_OR_DEPARTMENT_OTHER): Payer: Medicare HMO | Admitting: Nurse Practitioner

## 2021-04-17 VITALS — BP 168/98 | HR 62 | Temp 96.1°F | Ht 68.0 in | Wt 166.7 lb

## 2021-04-17 DIAGNOSIS — R748 Abnormal levels of other serum enzymes: Secondary | ICD-10-CM

## 2021-04-17 DIAGNOSIS — C20 Malignant neoplasm of rectum: Secondary | ICD-10-CM

## 2021-04-17 DIAGNOSIS — Z5111 Encounter for antineoplastic chemotherapy: Secondary | ICD-10-CM | POA: Diagnosis not present

## 2021-04-17 DIAGNOSIS — T451X5A Adverse effect of antineoplastic and immunosuppressive drugs, initial encounter: Secondary | ICD-10-CM

## 2021-04-17 DIAGNOSIS — D696 Thrombocytopenia, unspecified: Secondary | ICD-10-CM | POA: Insufficient documentation

## 2021-04-17 DIAGNOSIS — I10 Essential (primary) hypertension: Secondary | ICD-10-CM | POA: Diagnosis not present

## 2021-04-17 DIAGNOSIS — D509 Iron deficiency anemia, unspecified: Secondary | ICD-10-CM | POA: Diagnosis not present

## 2021-04-17 DIAGNOSIS — N183 Chronic kidney disease, stage 3 unspecified: Secondary | ICD-10-CM | POA: Insufficient documentation

## 2021-04-17 DIAGNOSIS — G620 Drug-induced polyneuropathy: Secondary | ICD-10-CM

## 2021-04-17 DIAGNOSIS — Z79899 Other long term (current) drug therapy: Secondary | ICD-10-CM | POA: Diagnosis not present

## 2021-04-17 DIAGNOSIS — Z95828 Presence of other vascular implants and grafts: Secondary | ICD-10-CM | POA: Diagnosis not present

## 2021-04-17 DIAGNOSIS — Z5112 Encounter for antineoplastic immunotherapy: Secondary | ICD-10-CM | POA: Insufficient documentation

## 2021-04-17 LAB — COMPREHENSIVE METABOLIC PANEL
ALT: 36 U/L (ref 0–44)
AST: 39 U/L (ref 15–41)
Albumin: 3.3 g/dL — ABNORMAL LOW (ref 3.5–5.0)
Alkaline Phosphatase: 228 U/L — ABNORMAL HIGH (ref 38–126)
Anion gap: 5 (ref 5–15)
BUN: 14 mg/dL (ref 8–23)
CO2: 24 mmol/L (ref 22–32)
Calcium: 8.7 mg/dL — ABNORMAL LOW (ref 8.9–10.3)
Chloride: 107 mmol/L (ref 98–111)
Creatinine, Ser: 1.47 mg/dL — ABNORMAL HIGH (ref 0.61–1.24)
GFR, Estimated: 49 mL/min — ABNORMAL LOW (ref >60)
Glucose, Bld: 102 mg/dL — ABNORMAL HIGH (ref 70–99)
Potassium: 4.3 mmol/L (ref 3.5–5.1)
Sodium: 136 mmol/L (ref 135–145)
Total Bilirubin: 0.7 mg/dL (ref 0.3–1.2)
Total Protein: 6.6 g/dL (ref 6.5–8.1)

## 2021-04-17 LAB — URINALYSIS, COMPLETE (UACMP) WITH MICROSCOPIC
Bacteria, UA: NONE SEEN
Bilirubin Urine: NEGATIVE
Glucose, UA: NEGATIVE mg/dL
Hgb urine dipstick: NEGATIVE
Ketones, ur: NEGATIVE mg/dL
Leukocytes,Ua: NEGATIVE
Nitrite: NEGATIVE
Protein, ur: NEGATIVE mg/dL
Specific Gravity, Urine: 1.013 (ref 1.005–1.030)
Squamous Epithelial / HPF: NONE SEEN (ref 0–5)
pH: 6 (ref 5.0–8.0)

## 2021-04-17 LAB — CBC WITH DIFFERENTIAL/PLATELET
Abs Immature Granulocytes: 0.02 10*3/uL (ref 0.00–0.07)
Basophils Absolute: 0.1 10*3/uL (ref 0.0–0.1)
Basophils Relative: 1 %
Eosinophils Absolute: 0.5 10*3/uL (ref 0.0–0.5)
Eosinophils Relative: 7 %
HCT: 43.9 % (ref 39.0–52.0)
Hemoglobin: 14.1 g/dL (ref 13.0–17.0)
Immature Granulocytes: 0 %
Lymphocytes Relative: 21 %
Lymphs Abs: 1.5 10*3/uL (ref 0.7–4.0)
MCH: 33.4 pg (ref 26.0–34.0)
MCHC: 32.1 g/dL (ref 30.0–36.0)
MCV: 104 fL — ABNORMAL HIGH (ref 80.0–100.0)
Monocytes Absolute: 0.9 10*3/uL (ref 0.1–1.0)
Monocytes Relative: 12 %
Neutro Abs: 4.3 10*3/uL (ref 1.7–7.7)
Neutrophils Relative %: 59 %
Platelets: 97 10*3/uL — ABNORMAL LOW (ref 150–400)
RBC: 4.22 MIL/uL (ref 4.22–5.81)
RDW: 15.9 % — ABNORMAL HIGH (ref 11.5–15.5)
WBC: 7.2 10*3/uL (ref 4.0–10.5)
nRBC: 0 % (ref 0.0–0.2)

## 2021-04-17 MED ORDER — SODIUM CHLORIDE 0.9 % IV SOLN
10.0000 mg | Freq: Once | INTRAVENOUS | Status: AC
  Administered 2021-04-17: 10 mg via INTRAVENOUS
  Filled 2021-04-17: qty 10

## 2021-04-17 MED ORDER — SODIUM CHLORIDE 0.9 % IV SOLN
INTRAVENOUS | Status: DC
  Filled 2021-04-17: qty 250

## 2021-04-17 MED ORDER — SODIUM CHLORIDE 0.9 % IV SOLN
10.0000 mg/kg | INTRAVENOUS | Status: DC
  Administered 2021-04-17: 800 mg via INTRAVENOUS
  Filled 2021-04-17: qty 32

## 2021-04-17 MED ORDER — SODIUM CHLORIDE 0.9 % IV SOLN
2400.0000 mg/m2 | INTRAVENOUS | Status: DC
  Administered 2021-04-17: 4650 mg via INTRAVENOUS
  Filled 2021-04-17: qty 93

## 2021-04-17 NOTE — Progress Notes (Signed)
C/o more hand and feet numbness, little red ("blood") dots on skin and nose bleeds since last treatment. ? ?Getting harder to button shirts with his hands since  last treatment. ?

## 2021-04-17 NOTE — Patient Instructions (Signed)
[facility] CANCER CTR AT [place]  Discharge Instructions: ?Thank you for choosing [facility] to provide your oncology and hematology care.  ?If you have a lab appointment with the [facility], please go directly to the [facility] and check in at the registration area. ? ?Wear comfortable clothing and clothing appropriate for easy access to any Portacath or PICC line.  ? ?We strive to give you quality time with your provider. You may need to reschedule your appointment if you arrive late (15 or more minutes).  Arriving late affects you and other patients whose appointments are after yours.  Also, if you miss three or more appointments without notifying the office, you may be dismissed from the clinic at the provider?s discretion.    ?  ?For prescription refill requests, have your pharmacy contact our office and allow 72 hours for refills to be completed.   ? ?Today you received the following chemotherapy and/or immunotherapy agents: Avastin / 5FU ?  ?To help prevent nausea and vomiting after your treatment, we encourage you to take your nausea medication as directed. ? ?BELOW ARE SYMPTOMS THAT SHOULD BE REPORTED IMMEDIATELY: ?*FEVER GREATER THAN 100.4 F (38 ?C) OR HIGHER ?*CHILLS OR SWEATING ?*NAUSEA AND VOMITING THAT IS NOT CONTROLLED WITH YOUR NAUSEA MEDICATION ?*UNUSUAL SHORTNESS OF BREATH ?*UNUSUAL BRUISING OR BLEEDING ?*URINARY PROBLEMS (pain or burning when urinating, or frequent urination) ?*BOWEL PROBLEMS (unusual diarrhea, constipation, pain near the anus) ?TENDERNESS IN MOUTH AND THROAT WITH OR WITHOUT PRESENCE OF ULCERS (sore throat, sores in mouth, or a toothache) ?UNUSUAL RASH, SWELLING OR PAIN  ?UNUSUAL VAGINAL DISCHARGE OR ITCHING  ? ?Items with * indicate a potential emergency and should be followed up as soon as possible or go to the Emergency Department if any problems should occur. ? ?Please show the CHEMOTHERAPY ALERT CARD or IMMUNOTHERAPY ALERT CARD at check-in to  the Emergency Department and triage nurse. ? ?Should you have questions after your visit or need to cancel or reschedule your appointment, please contact [facility] CANCER [place] AT [place]  [phone] and follow the prompts.  Office hours are 8:00 a.m. to 4:30 p.m. Monday - Friday. Please note that voicemails left after 4:00 p.m. may not be returned until the following business day.  We are closed weekends and major holidays. You have access to a nurse at all times for urgent questions. Please call the main number to the clinic [phone] and follow the prompts. ? ?For any non-urgent questions, you may also contact your provider using MyChart. We now offer e-Visits for anyone 4 and older to request care online for non-urgent symptoms. For details visit [email or web address]. ?  ?Also download the MyChart app! Go to the app store, search "MyChart", open the app, select [facility], and log in with your MyChart username and password. ? ?Due to Covid, a mask is required upon entering the hospital/clinic. If you do not have a mask, one will be given to you upon arrival. For doctor visits, patients may have 1 support person aged 26 or older with them. For treatment visits, patients cannot have anyone with them due to current Covid guidelines and our immunocompromised population.  ?

## 2021-04-17 NOTE — Progress Notes (Signed)
[facility] ?CONSULT NOTE ? ?Patient Care Team: ?[name], MD as PCP - General (Family Medicine) ?Clent Jacks, RN as Oncology Nurse Navigator ? ?CHIEF COMPLAINTS/PURPOSE OF CONSULTATION: colon/rectal cancer ? ?#  ?Oncology History Overview Note  ?# Malignant partially obstructing tumor in the transverse colon/70 cm proximal to the anus- C. COLON MASS, 70 CM; COLD BIOPSY:  ?- INTRAMUCOSAL ADENOCARCINOMA AT LEAST; ? ?# One 20 mm polyp in the transverse colon, removed with mucosal resection. Resected and retrieved. Clips were ?placed. Tattooed. ? ?# tumor in the mid rectum and at 10 cm proximal to the anus. Biopsied. ? ? ? ? ? SEE COMMENT.  ? ?Comment:  ?There is no definitive evidence of invasion in this sample.  The  ?findings may not accurately represent the entire underlying lesion;  ?clinical correlation is recommended.  ? ?D. COLON POLYP, TRANSVERSE; HOT SNARE:  ?- TUBULOVILLOUS ADENOMA.  ?- NEGATIVE FOR HIGH GRADE DYSPLASIA AND MALIGNANCY.  ? ?E. RECTUM MASS; COLD BIOPSY:  ?- INVASIVE ADENOCARCINOMA, MODERATELY TO POORLY DIFFERENTIATED. ?----------------------------------   ?# PET scan: SEP 2022-proximal right colon [adjacent mesenteric lymph nodes] and rectal hypermetabolism.  Additional subcentimeter bilateral hypermetabolic lymphadenopathy noted in the retroperitoneal/left external iliac; inferior right lobe of the liver concerning for metastatic disease; right lower quadrant soft tissue mass concerning for peritoneal carcinomatosis; bilateral solid pulmonary nodules ~5 mm; MRI rectum- T stage: T4a; N stage:  N1.  ? ?# 09/12-2020- FOLFOX chemo [Dr.White/Dr.Vanga]; ADDED BEV with cycle #3-DC 5-FU bolus+LV; # 6-oxaliplatin dose reduced by 20%[thrombocytopenia];  starting cycle #13-will discontinue oxaliplatin [given PN-G-12;/thrombocytopenia] ?  ?Rectal cancer ([place])  ?09/11/2020 Initial Diagnosis  ? Rectal cancer [facility]) ?  ?09/25/2020 -  Chemotherapy  ? Patient is on Treatment Plan :  COLORECTAL FOLFOX q14d x 4 months  ?   ?09/28/2020 Cancer Staging  ? Staging form: Colon and Rectum, AJCC 8th Edition ?- Clinical: Stage IVC (cT4a, cN1, cM1c) - Signed by Cammie Sickle, MD on 09/28/2020 ?  ? Genetic Testing  ? Negative genetic testing. No pathogenic variants identified on the [facility] Multi-Cancer+RNA Panel. The report date is 11/05/2020. ? ?The Multi-Cancer Panel + RNA offered by [facility] includes sequencing and/or deletion duplication testing of the following 84 genes: AIP, ALK, APC, ATM, AXIN2,BAP1,  BARD1, BLM, BMPR1A, BRCA1, BRCA2, BRIP1, CASR, CDC73, CDH1, CDK4, CDKN1B, CDKN1C, CDKN2A (p14ARF), CDKN2A (p16INK4a), CEBPA, CHEK2, CTNNA1, DICER1, DIS3L2, EGFR (c.2369C>T, p.Thr790Met variant only), EPCAM (Deletion/duplication testing only), FH, FLCN, GATA2, GPC3, GREM1 (Promoter region deletion/duplication testing only), HOXB13 (c.251G>A, p.Gly84Glu), HRAS, KIT, MAX, MEN1, MET, MITF (c.952G>A, p.Glu318Lys variant only), MLH1, MSH2, MSH3, MSH6, MUTYH, NBN, NF1, NF2, NTHL1, PALB2, PDGFRA, PHOX2B, PMS2, POLD1, POLE, POT1, PRKAR1A, PTCH1, PTEN, RAD50, RAD51C, RAD51D, RB1, RECQL4, RET, RUNX1, SDHAF2, SDHA (sequence changes only), SDHB, SDHC, SDHD, SMAD4, SMARCA4, SMARCB1, SMARCE1, STK11, SUFU, TERC, TERT, TMEM127, TP53, TSC1, TSC2, VHL, WRN and WT1. ?  ? ? ? ?HISTORY OF PRESENTING ILLNESS: Ambulating independently.  Accompanied by daughter. ? ?Albert Giles 76 y.o.  male synchronous colon cancer/rectal cancer-stage IV-on FOLFOX chemotherapy is here for consideration of chemotherapy. Continues to have numbness of fingers and toes. Bothersome but not worse. 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 Nosebleeds have nearly resolved. Has a few pin point spots of bruising on hands. Moving bowel normally. Denies pain. No other complaints.  ? ?Review of Systems  ?Constitutional:  Negative for chills, diaphoresis, fever and weight loss.  ?HENT:  Negative for nosebleeds and sore throat.   ?Eyes:  Negative for  double vision.  ?Respiratory:  Negative for cough, hemoptysis, sputum production, shortness of breath and wheezing.   ?Cardiovascular:  Negative for chest pain, palpitations, orthopnea and leg swelling.  ?Gastrointestinal:  Negative for abdominal pain, blood in stool, constipation, diarrhea, heartburn, melena, nausea and vomiting.  ?Genitourinary:  Negative for dysuria, frequency and urgency.  ?Skin: Negative.  Negative for itching and rash.  ?Neurological:  Positive for tingling and sensory change. Negative for dizziness, focal weakness, weakness and headaches.  ?Endo/Heme/Allergies:  Does not bruise/bleed easily.  ?Psychiatric/Behavioral:  Negative for depression. The patient is not nervous/anxious and does not have insomnia.    ? ?MEDICAL HISTORY:  ?Past Medical History:  ?Diagnosis Date  ? CAD (coronary artery disease) 06/2014  ? UA with NSTEMI - cath with 99% prox L circ s/p stent, EF 40% ([facility], [place] at [facility])  ? Emphysema   ? mild  ? Ex-smoker   ? Family history of breast cancer   ? Family history of colon cancer   ? Family history of pancreatic cancer   ? NSTEMI (non-ST elevated myocardial infarction) ([place]) 07/13/2014  ? ? ?SURGICAL HISTORY: ?Past Surgical History:  ?Procedure Laterality Date  ? CARDIAC CATHETERIZATION N/A 07/14/2014  ? Left Heart Cath and Coronary Angiography with stent placement;  Surgeon: Teodoro Spray, MD  ? CARDIAC CATHETERIZATION N/A 07/14/2014  ? Coronary Stent Intervention;  Surgeon: Yolonda Kida, MD  ? COLONOSCOPY WITH PROPOFOL N/A 09/06/2020  ? Procedure: COLONOSCOPY WITH PROPOFOL;  Surgeon: Lin Landsman, MD;  Location: [facility] ENDOSCOPY;  Service: Gastroenterology;  Laterality: N/A;  ? CYSTECTOMY    ? on back  ? ESOPHAGOGASTRODUODENOSCOPY N/A 09/06/2020  ? Procedure: ESOPHAGOGASTRODUODENOSCOPY (EGD);  Surgeon: Lin Landsman, MD;  Location: [facility] ENDOSCOPY;  Service: Gastroenterology;  Laterality: N/A;  ? INGUINAL HERNIA REPAIR Right 08/17/04  ? IR IMAGING GUIDED PORT  INSERTION  09/13/2020  ? ? ?SOCIAL HISTORY: ?Social History  ? ?Socioeconomic History  ? Marital status: Married  ?  Spouse name: Not on file  ? Number of children: Not on file  ? Years of education: Not on file  ? Highest education level: Not on file  ?Occupational History  ? Not on file  ?Tobacco Use  ? Smoking status: Former  ?  Packs/day: 1.00  ?  Years: 35.00  ?  Pack years: 35.00  ?  Types: Cigarettes  ?  Quit date: 07/07/1998  ?  Years since quitting: 22.7  ? Smokeless tobacco: Never  ?Vaping Use  ? Vaping Use: Never used  ?Substance and Sexual Activity  ? Alcohol use: No  ? Drug use: No  ? Sexual activity: Yes  ?Other Topics Concern  ? Not on file  ?Social History Narrative  ? Caffeine: 2 cups coffee, 2 cups tea/day  ? Lives with wife  ? Occupation: Higher education careers adviser, retired  ? Edu: HS  ? Activity: works in yard, walking  ? Diet: some water, fruits/vegetables daily  ? -------------------------------------------------------------------   ?   ? 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   ? ?Social Determinants of Health  ? ?Financial Resource Strain: Low Risk   ? Difficulty of Paying Living Expenses: Not hard at all  ?Food Insecurity: No Food Insecurity  ? Worried About Charity fundraiser in the Last Year: Never true  ? Ran Out of Food in the Last Year: Never true  ?Transportation Needs: No Transportation Needs  ? Lack of  Transportation (Medical): No  ? Lack of Transportation (Non-Medical): No  ?Physical Activity: Inactive  ? Days of Exercise per Week: 0 days  ? Minutes of Exercise per Session: 0 min  ?Stress: No Stress Concern Present  ? Feeling of Stress : Not at all  ?Social Connections: Not on file  ?Intimate Partner Violence: Not At Risk  ? Fear of Current or Ex-Partner: No  ? Emotionally Abused: No  ? Physically Abused: No  ? Sexually Abused: No  ? ? ?FAMILY HISTORY: ?Family History  ?Problem Relation Age of Onset  ? Cancer  Mother 46  ?     colon  ? Cancer Sister   ?     breast  ? Crohn's disease Sister   ? Cancer Daughter   ?     anal  ? Crohn's disease Niece   ? CAD Neg Hx   ? Stroke Neg Hx   ? Diabetes Neg Hx   ? Prostate can

## 2021-04-18 LAB — CEA: CEA: 3.2 ng/mL (ref 0.0–4.7)

## 2021-04-19 ENCOUNTER — Inpatient Hospital Stay: Payer: Medicare HMO

## 2021-04-19 DIAGNOSIS — Z5111 Encounter for antineoplastic chemotherapy: Secondary | ICD-10-CM | POA: Diagnosis not present

## 2021-04-19 DIAGNOSIS — D696 Thrombocytopenia, unspecified: Secondary | ICD-10-CM | POA: Diagnosis not present

## 2021-04-19 DIAGNOSIS — Z5112 Encounter for antineoplastic immunotherapy: Secondary | ICD-10-CM | POA: Diagnosis not present

## 2021-04-19 DIAGNOSIS — C20 Malignant neoplasm of rectum: Secondary | ICD-10-CM

## 2021-04-19 DIAGNOSIS — N183 Chronic kidney disease, stage 3 unspecified: Secondary | ICD-10-CM | POA: Diagnosis not present

## 2021-04-19 DIAGNOSIS — D509 Iron deficiency anemia, unspecified: Secondary | ICD-10-CM | POA: Diagnosis not present

## 2021-04-19 DIAGNOSIS — Z79899 Other long term (current) drug therapy: Secondary | ICD-10-CM | POA: Diagnosis not present

## 2021-04-19 DIAGNOSIS — I10 Essential (primary) hypertension: Secondary | ICD-10-CM | POA: Diagnosis not present

## 2021-04-19 MED ORDER — SODIUM CHLORIDE 0.9% FLUSH
10.0000 mL | INTRAVENOUS | Status: DC | PRN
  Administered 2021-04-19: 10 mL
  Filled 2021-04-19: qty 10

## 2021-04-19 MED ORDER — HEPARIN SOD (PORK) LOCK FLUSH 100 UNIT/ML IV SOLN
500.0000 [IU] | Freq: Once | INTRAVENOUS | Status: AC | PRN
  Administered 2021-04-19: 500 [IU]
  Filled 2021-04-19: qty 5

## 2021-04-25 DIAGNOSIS — C20 Malignant neoplasm of rectum: Secondary | ICD-10-CM | POA: Diagnosis not present

## 2021-05-01 ENCOUNTER — Inpatient Hospital Stay: Payer: Medicare HMO

## 2021-05-01 ENCOUNTER — Inpatient Hospital Stay (HOSPITAL_BASED_OUTPATIENT_CLINIC_OR_DEPARTMENT_OTHER): Payer: Medicare HMO | Admitting: Internal Medicine

## 2021-05-01 ENCOUNTER — Encounter: Payer: Self-pay | Admitting: Internal Medicine

## 2021-05-01 VITALS — BP 160/99 | HR 59

## 2021-05-01 DIAGNOSIS — I10 Essential (primary) hypertension: Secondary | ICD-10-CM | POA: Diagnosis not present

## 2021-05-01 DIAGNOSIS — C20 Malignant neoplasm of rectum: Secondary | ICD-10-CM

## 2021-05-01 DIAGNOSIS — D509 Iron deficiency anemia, unspecified: Secondary | ICD-10-CM | POA: Diagnosis not present

## 2021-05-01 DIAGNOSIS — D696 Thrombocytopenia, unspecified: Secondary | ICD-10-CM | POA: Diagnosis not present

## 2021-05-01 DIAGNOSIS — Z79899 Other long term (current) drug therapy: Secondary | ICD-10-CM | POA: Diagnosis not present

## 2021-05-01 DIAGNOSIS — Z5112 Encounter for antineoplastic immunotherapy: Secondary | ICD-10-CM | POA: Diagnosis not present

## 2021-05-01 DIAGNOSIS — N183 Chronic kidney disease, stage 3 unspecified: Secondary | ICD-10-CM | POA: Diagnosis not present

## 2021-05-01 DIAGNOSIS — Z5111 Encounter for antineoplastic chemotherapy: Secondary | ICD-10-CM | POA: Diagnosis not present

## 2021-05-01 LAB — COMPREHENSIVE METABOLIC PANEL
ALT: 24 U/L (ref 0–44)
AST: 32 U/L (ref 15–41)
Albumin: 3.3 g/dL — ABNORMAL LOW (ref 3.5–5.0)
Alkaline Phosphatase: 206 U/L — ABNORMAL HIGH (ref 38–126)
Anion gap: 6 (ref 5–15)
BUN: 12 mg/dL (ref 8–23)
CO2: 24 mmol/L (ref 22–32)
Calcium: 8.7 mg/dL — ABNORMAL LOW (ref 8.9–10.3)
Chloride: 108 mmol/L (ref 98–111)
Creatinine, Ser: 1.78 mg/dL — ABNORMAL HIGH (ref 0.61–1.24)
GFR, Estimated: 39 mL/min — ABNORMAL LOW (ref >60)
Glucose, Bld: 128 mg/dL — ABNORMAL HIGH (ref 70–99)
Potassium: 3.9 mmol/L (ref 3.5–5.1)
Sodium: 138 mmol/L (ref 135–145)
Total Bilirubin: 0.6 mg/dL (ref 0.3–1.2)
Total Protein: 6.5 g/dL (ref 6.5–8.1)

## 2021-05-01 LAB — URINALYSIS, COMPLETE (UACMP) WITH MICROSCOPIC
Bacteria, UA: NONE SEEN
Bilirubin Urine: NEGATIVE
Glucose, UA: NEGATIVE mg/dL
Hgb urine dipstick: NEGATIVE
Ketones, ur: NEGATIVE mg/dL
Leukocytes,Ua: NEGATIVE
Nitrite: NEGATIVE
Protein, ur: NEGATIVE mg/dL
Specific Gravity, Urine: 1.014 (ref 1.005–1.030)
Squamous Epithelial / HPF: NONE SEEN (ref 0–5)
pH: 5 (ref 5.0–8.0)

## 2021-05-01 LAB — CBC WITH DIFFERENTIAL/PLATELET
Abs Immature Granulocytes: 0.01 10*3/uL (ref 0.00–0.07)
Basophils Absolute: 0.1 10*3/uL (ref 0.0–0.1)
Basophils Relative: 1 %
Eosinophils Absolute: 0.4 10*3/uL (ref 0.0–0.5)
Eosinophils Relative: 6 %
HCT: 44.1 % (ref 39.0–52.0)
Hemoglobin: 14.1 g/dL (ref 13.0–17.0)
Immature Granulocytes: 0 %
Lymphocytes Relative: 21 %
Lymphs Abs: 1.5 10*3/uL (ref 0.7–4.0)
MCH: 33.5 pg (ref 26.0–34.0)
MCHC: 32 g/dL (ref 30.0–36.0)
MCV: 104.8 fL — ABNORMAL HIGH (ref 80.0–100.0)
Monocytes Absolute: 0.7 10*3/uL (ref 0.1–1.0)
Monocytes Relative: 10 %
Neutro Abs: 4.3 10*3/uL (ref 1.7–7.7)
Neutrophils Relative %: 62 %
Platelets: 86 10*3/uL — ABNORMAL LOW (ref 150–400)
RBC: 4.21 MIL/uL — ABNORMAL LOW (ref 4.22–5.81)
RDW: 15.7 % — ABNORMAL HIGH (ref 11.5–15.5)
WBC: 7 10*3/uL (ref 4.0–10.5)
nRBC: 0 % (ref 0.0–0.2)

## 2021-05-01 MED ORDER — SODIUM CHLORIDE 0.9 % IV SOLN
10.0000 mg/kg | INTRAVENOUS | Status: DC
  Administered 2021-05-01: 800 mg via INTRAVENOUS
  Filled 2021-05-01: qty 32

## 2021-05-01 MED ORDER — SODIUM CHLORIDE 0.9 % IV SOLN
2400.0000 mg/m2 | INTRAVENOUS | Status: DC
  Administered 2021-05-01: 4650 mg via INTRAVENOUS
  Filled 2021-05-01: qty 93

## 2021-05-01 MED ORDER — DEXTROSE 5 % IV SOLN
Freq: Once | INTRAVENOUS | Status: AC
  Filled 2021-05-01: qty 250

## 2021-05-01 MED ORDER — SODIUM CHLORIDE 0.9 % IV SOLN
10.0000 mg | Freq: Once | INTRAVENOUS | Status: AC
  Administered 2021-05-01: 10 mg via INTRAVENOUS
  Filled 2021-05-01: qty 10

## 2021-05-01 NOTE — Patient Instructions (Signed)
[facility] CANCER CTR AT [place]-MEDICAL ONCOLOGY  Discharge Instructions: °Thank you for choosing [facility] to provide your oncology and hematology care.  ° °If you have a lab appointment with the Cancer Center, please go directly to the Cancer Center and check in at the registration area. °  °Wear comfortable clothing and clothing appropriate for easy access to any Portacath or PICC line.  ° °We strive to give you quality time with your provider. You may need to reschedule your appointment if you arrive late (15 or more minutes).  Arriving late affects you and other patients whose appointments are after yours.  Also, if you miss three or more appointments without notifying the office, you may be dismissed from the clinic at the provider’s discretion.    °  °For prescription refill requests, have your pharmacy contact our office and allow 72 hours for refills to be completed.   ° °Today you received the following chemotherapy and/or immunotherapy agents     °  °To help prevent nausea and vomiting after your treatment, we encourage you to take your nausea medication as directed. ° °BELOW ARE SYMPTOMS THAT SHOULD BE REPORTED IMMEDIATELY: °*FEVER GREATER THAN 100.4 F (38 °C) OR HIGHER °*CHILLS OR SWEATING °*NAUSEA AND VOMITING THAT IS NOT CONTROLLED WITH YOUR NAUSEA MEDICATION °*UNUSUAL SHORTNESS OF BREATH °*UNUSUAL BRUISING OR BLEEDING °*URINARY PROBLEMS (pain or burning when urinating, or frequent urination) °*BOWEL PROBLEMS (unusual diarrhea, constipation, pain near the anus) °TENDERNESS IN MOUTH AND THROAT WITH OR WITHOUT PRESENCE OF ULCERS (sore throat, sores in mouth, or a toothache) °UNUSUAL RASH, SWELLING OR PAIN  °UNUSUAL VAGINAL DISCHARGE OR ITCHING  ° °Items with * indicate a potential emergency and should be followed up as soon as possible or go to the Emergency Department if any problems should occur. ° °Please show the CHEMOTHERAPY ALERT CARD or IMMUNOTHERAPY ALERT CARD at check-in to the  Emergency Department and triage nurse. ° °Should you have questions after your visit or need to cancel or reschedule your appointment, please contact [facility] CANCER CTR AT [place]-MEDICAL ONCOLOGY  Dept: [phone]  and follow the prompts.  Office hours are 8:00 a.m. to 4:30 p.m. Monday - Friday. Please note that voicemails left after 4:00 p.m. may not be returned until the following business day.  We are closed weekends and major holidays. You have access to a nurse at all times for urgent questions. Please call the main number to the clinic Dept: [phone] and follow the prompts. ° ° °For any non-urgent questions, you may also contact your provider using MyChart. We now offer e-Visits for anyone 18 and older to request care online for non-urgent symptoms. For details visit [email or web address]. °  °Also download the MyChart app! Go to the app store, search "MyChart", open the app, select [place], and log in with your MyChart username and password. ° °Due to Covid, a mask is required upon entering the hospital/clinic. If you do not have a mask, one will be given to you upon arrival. For doctor visits, patients may have 1 support person aged 18 or older with them. For treatment visits, patients cannot have anyone with them due to current Covid guidelines and our immunocompromised population.  ° °

## 2021-05-01 NOTE — Progress Notes (Signed)
[place] ?CONSULT NOTE ? ?Patient Care Team: ?[name], MD as PCP - General (Family Medicine) ?Clent Jacks, RN as Oncology Nurse Navigator ? ?CHIEF COMPLAINTS/PURPOSE OF CONSULTATION: colon/rectal cancer ? ?#  ?Oncology History Overview Note  ?# Malignant partially obstructing tumor in the transverse colon/70 cm proximal to the anus- C. COLON MASS, 70 CM; COLD BIOPSY:  ?- INTRAMUCOSAL ADENOCARCINOMA AT LEAST; ? ?# One 20 mm polyp in the transverse colon, removed with mucosal resection. Resected and retrieved. Clips were ?placed. Tattooed. ? ?# tumor in the mid rectum and at 10 cm proximal to the anus. Biopsied. ? ? ? ? ? SEE COMMENT.  ? ?Comment:  ?There is no definitive evidence of invasion in this sample.  The  ?findings may not accurately represent the entire underlying lesion;  ?clinical correlation is recommended.  ? ?D. COLON POLYP, TRANSVERSE; HOT SNARE:  ?- TUBULOVILLOUS ADENOMA.  ?- NEGATIVE FOR HIGH GRADE DYSPLASIA AND MALIGNANCY.  ? ?E. RECTUM MASS; COLD BIOPSY:  ?- INVASIVE ADENOCARCINOMA, MODERATELY TO POORLY DIFFERENTIATED. ?----------------------------------   ?# PET scan: SEP 2022-proximal right colon [adjacent mesenteric lymph nodes] and rectal hypermetabolism.  Additional subcentimeter bilateral hypermetabolic lymphadenopathy noted in the retroperitoneal/left external iliac; inferior right lobe of the liver concerning for metastatic disease; right lower quadrant soft tissue mass concerning for peritoneal carcinomatosis; bilateral solid pulmonary nodules ~5 mm; MRI rectum- T stage: T4a; N stage:  N1.  ? ?# 09/12-2020- FOLFOX chemo [Dr.White/Dr.Vanga]; ADDED BEV with cycle #3-DC 5-FU bolus+LV; # 6-oxaliplatin dose reduced by 20%[thrombocytopenia];  starting cycle #13-will discontinue oxaliplatin [given PN-G-12;/thrombocytopenia] ?  ?Rectal cancer ([place])  ?09/11/2020 Initial Diagnosis  ? Rectal cancer [facility]) ?  ?09/25/2020 -  Chemotherapy  ? Patient is on Treatment Plan :  COLORECTAL FOLFOX q14d x 4 months  ? ?  ?  ?09/28/2020 Cancer Staging  ? Staging form: Colon and Rectum, AJCC 8th Edition ?- Clinical: Stage IVC (cT4a, cN1, cM1c) - Signed by Cammie Sickle, MD on 09/28/2020 ? ?  ? Genetic Testing  ? Negative genetic testing. No pathogenic variants identified on the [facility] Multi-Cancer+RNA Panel. The report date is 11/05/2020. ? ?The Multi-Cancer Panel + RNA offered by [facility] includes sequencing and/or deletion duplication testing of the following 84 genes: AIP, ALK, APC, ATM, AXIN2,BAP1,  BARD1, BLM, BMPR1A, BRCA1, BRCA2, BRIP1, CASR, CDC73, CDH1, CDK4, CDKN1B, CDKN1C, CDKN2A (p14ARF), CDKN2A (p16INK4a), CEBPA, CHEK2, CTNNA1, DICER1, DIS3L2, EGFR (c.2369C>T, p.Thr790Met variant only), EPCAM (Deletion/duplication testing only), FH, FLCN, GATA2, GPC3, GREM1 (Promoter region deletion/duplication testing only), HOXB13 (c.251G>A, p.Gly84Glu), HRAS, KIT, MAX, MEN1, MET, MITF (c.952G>A, p.Glu318Lys variant only), MLH1, MSH2, MSH3, MSH6, MUTYH, NBN, NF1, NF2, NTHL1, PALB2, PDGFRA, PHOX2B, PMS2, POLD1, POLE, POT1, PRKAR1A, PTCH1, PTEN, RAD50, RAD51C, RAD51D, RB1, RECQL4, RET, RUNX1, SDHAF2, SDHA (sequence changes only), SDHB, SDHC, SDHD, SMAD4, SMARCA4, SMARCB1, SMARCE1, STK11, SUFU, TERC, TERT, TMEM127, TP53, TSC1, TSC2, VHL, WRN and WT1. ?  ? ? ? ?HISTORY OF PRESENTING ILLNESS: Ambulating independently.  Accompanied by daughter. ? ?Albert Giles 76 y.o.  male synchronous colon cancer/rectal cancer-stage IV-on Haze Rushing is here for follow-up. ? ?Patient continues to complain of tingling and numbness in his fingertips and toes.  Concerned about balance.  However no falls. ? ?No diarrhea.  No constipation.  Complains of mild fatigue.  ? ?Review of Systems  ?Constitutional:  Negative for chills, diaphoresis, fever and weight loss.  ?HENT:  Negative for nosebleeds and sore throat.   ?Eyes:  Negative for double vision.  ?Respiratory:  Negative for  cough, hemoptysis, sputum production,  shortness of breath and wheezing.   ?Cardiovascular:  Negative for chest pain, palpitations, orthopnea and leg swelling.  ?Gastrointestinal:  Negative for abdominal pain, blood in stool, constipation, diarrhea, heartburn, melena, nausea and vomiting.  ?Genitourinary:  Negative for dysuria, frequency and urgency.  ?Skin: Negative.  Negative for itching and rash.  ?Neurological:  Positive for tingling and sensory change. Negative for dizziness, focal weakness, weakness and headaches.  ?Endo/Heme/Allergies:  Does not bruise/bleed easily.  ?Psychiatric/Behavioral:  Negative for depression. The patient is not nervous/anxious and does not have insomnia.    ? ?MEDICAL HISTORY:  ?Past Medical History:  ?Diagnosis Date  ? CAD (coronary artery disease) 06/2014  ? UA with NSTEMI - cath with 99% prox L circ s/p stent, EF 40% ([facility], [place] at [facility])  ? Emphysema   ? mild  ? Ex-smoker   ? Family history of breast cancer   ? Family history of colon cancer   ? Family history of pancreatic cancer   ? NSTEMI (non-ST elevated myocardial infarction) ([place]) 07/13/2014  ? ? ?SURGICAL HISTORY: ?Past Surgical History:  ?Procedure Laterality Date  ? CARDIAC CATHETERIZATION N/A 07/14/2014  ? Left Heart Cath and Coronary Angiography with stent placement;  Surgeon: Teodoro Spray, MD  ? CARDIAC CATHETERIZATION N/A 07/14/2014  ? Coronary Stent Intervention;  Surgeon: Yolonda Kida, MD  ? COLONOSCOPY WITH PROPOFOL N/A 09/06/2020  ? Procedure: COLONOSCOPY WITH PROPOFOL;  Surgeon: Lin Landsman, MD;  Location: [facility] ENDOSCOPY;  Service: Gastroenterology;  Laterality: N/A;  ? CYSTECTOMY    ? on back  ? ESOPHAGOGASTRODUODENOSCOPY N/A 09/06/2020  ? Procedure: ESOPHAGOGASTRODUODENOSCOPY (EGD);  Surgeon: Lin Landsman, MD;  Location: [facility] ENDOSCOPY;  Service: Gastroenterology;  Laterality: N/A;  ? INGUINAL HERNIA REPAIR Right 08/17/04  ? IR IMAGING GUIDED PORT INSERTION  09/13/2020  ? ? ?SOCIAL HISTORY: ?Social History  ? ?Socioeconomic History   ? Marital status: Married  ?  Spouse name: Not on file  ? Number of children: Not on file  ? Years of education: Not on file  ? Highest education level: Not on file  ?Occupational History  ? Not on file  ?Tobacco Use  ? Smoking status: Former  ?  Packs/day: 1.00  ?  Years: 35.00  ?  Pack years: 35.00  ?  Types: Cigarettes  ?  Quit date: 07/07/1998  ?  Years since quitting: 22.8  ? Smokeless tobacco: Never  ?Vaping Use  ? Vaping Use: Never used  ?Substance and Sexual Activity  ? Alcohol use: No  ? Drug use: No  ? Sexual activity: Yes  ?Other Topics Concern  ? Not on file  ?Social History Narrative  ? Caffeine: 2 cups coffee, 2 cups tea/day  ? Lives with wife  ? Occupation: Higher education careers adviser, retired  ? Edu: HS  ? Activity: works in yard, walking  ? Diet: some water, fruits/vegetables daily  ? -------------------------------------------------------------------   ?   ? 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   ? ?Social Determinants of Health  ? ?Financial Resource Strain: Low Risk   ? Difficulty of Paying Living Expenses: Not hard at all  ?Food Insecurity: No Food Insecurity  ? Worried About Charity fundraiser in the Last Year: Never true  ? Ran Out of Food in the Last Year: Never true  ?Transportation Needs: No Transportation Needs  ? Lack of Transportation (Medical): No  ? Lack  of Transportation (Non-Medical): No  ?Physical Activity: Inactive  ? Days of Exercise per Week: 0 days  ? Minutes of Exercise per Session: 0 min  ?Stress: No Stress Concern Present  ? Feeling of Stress : Not at all  ?Social Connections: Not on file  ?Intimate Partner Violence: Not At Risk  ? Fear of Current or Ex-Partner: No  ? Emotionally Abused: No  ? Physically Abused: No  ? Sexually Abused: No  ? ? ?FAMILY HISTORY: ?Family History  ?Problem Relation Age of Onset  ? Cancer Mother 76  ?     colon  ? Cancer Sister   ?     breast  ? Crohn's disease Sister   ?  Cancer Daughter   ?     anal  ? Crohn's disease Niece   ? CAD Neg Hx   ? Stroke Neg Hx   ? Diabetes Neg Hx   ? Prostate cancer Neg Hx   ? Kidney cancer Neg Hx   ? Bladder Cancer Neg Hx   ? ? ?ALLERGIES:  ha

## 2021-05-01 NOTE — Progress Notes (Signed)
Cr 1.78 ok to proceed per MD ?

## 2021-05-01 NOTE — Progress Notes (Signed)
Proceed with treatment today.  Dr. Burlene Arnt is aware of BP.  ?

## 2021-05-01 NOTE — Assessment & Plan Note (Addendum)
#   STAGE IV- Synchronous primaries a] rectal cancer-10 cm-adenocarcinoma & b] transverse colon-- intramucosal adenoca]; abdominal lymphadenopathy; liver metastases; omental metastases.  S/p 12 cycles of FOLFOX plus Bev- CT scan -MARCH 2nd, 2023- Decreased size of a mental soft tissue nodule measuring 1.1 x 0.6 cm [previously- 1.6 x 1.2 cm.? ? ??# proceed with 5FU+Avastin #15  Dscontinued oxaliplatin [given PN/thrombocytopeniasee below] continue BEV [see below]  Labs today reviewed;  acceptable for treatment today.  We will plan repeat imaging in June. ? ?# Anemia/iron deficiency hemoglobin ~13-  secondary to chronic GI bleeding/tumor-STABLE ? ?# Thrombocytopenia/secondary chemotherapy/on Plavix-aspirin-platelets- 80s-monitor closely. ? ?# HTN- 180/102; at home BP reviewed- 130-150/ DBP80-90s.continue checking BP at home/reviewed the log from home.  Proceed with avastin today. STABLE.  ? ?# PN G-2-3 sec to Oxaliplatin. discontinued oxaliplatin for now. Consider neurotin  ? ?# Chronic kidney disease stage III-[GFR-39 ] -slightly worse overall stable.  Continue keeping up with hydration.  ?? ?# DISPOSITION:   ?# 5FU+Bev today;  ?# Follow up in 2 weeks- MD: labs- cbc/cmp;UA CEA;  5FU+ bev; Pump off in 2 days- Dr.B; ? ? ? ? ?

## 2021-05-01 NOTE — Progress Notes (Signed)
Tingling in fingers and toes, more significant with each treatment. ?

## 2021-05-02 LAB — CEA: CEA: 3.2 ng/mL (ref 0.0–4.7)

## 2021-05-03 ENCOUNTER — Inpatient Hospital Stay: Payer: Medicare HMO

## 2021-05-03 VITALS — BP 160/86 | HR 66 | Resp 18

## 2021-05-03 DIAGNOSIS — C20 Malignant neoplasm of rectum: Secondary | ICD-10-CM

## 2021-05-03 DIAGNOSIS — D696 Thrombocytopenia, unspecified: Secondary | ICD-10-CM | POA: Diagnosis not present

## 2021-05-03 DIAGNOSIS — Z5112 Encounter for antineoplastic immunotherapy: Secondary | ICD-10-CM | POA: Diagnosis not present

## 2021-05-03 DIAGNOSIS — Z79899 Other long term (current) drug therapy: Secondary | ICD-10-CM | POA: Diagnosis not present

## 2021-05-03 DIAGNOSIS — N183 Chronic kidney disease, stage 3 unspecified: Secondary | ICD-10-CM | POA: Diagnosis not present

## 2021-05-03 DIAGNOSIS — Z5111 Encounter for antineoplastic chemotherapy: Secondary | ICD-10-CM | POA: Diagnosis not present

## 2021-05-03 DIAGNOSIS — D509 Iron deficiency anemia, unspecified: Secondary | ICD-10-CM | POA: Diagnosis not present

## 2021-05-03 DIAGNOSIS — I10 Essential (primary) hypertension: Secondary | ICD-10-CM | POA: Diagnosis not present

## 2021-05-03 MED ORDER — SODIUM CHLORIDE 0.9% FLUSH
10.0000 mL | INTRAVENOUS | Status: DC | PRN
  Administered 2021-05-03: 10 mL
  Filled 2021-05-03: qty 10

## 2021-05-03 MED ORDER — HEPARIN SOD (PORK) LOCK FLUSH 100 UNIT/ML IV SOLN
500.0000 [IU] | Freq: Once | INTRAVENOUS | Status: AC | PRN
  Administered 2021-05-03: 500 [IU]
  Filled 2021-05-03: qty 5

## 2021-05-10 ENCOUNTER — Other Ambulatory Visit: Payer: Self-pay | Admitting: Urology

## 2021-05-10 DIAGNOSIS — N529 Male erectile dysfunction, unspecified: Secondary | ICD-10-CM

## 2021-05-10 DIAGNOSIS — N138 Other obstructive and reflux uropathy: Secondary | ICD-10-CM

## 2021-05-15 ENCOUNTER — Inpatient Hospital Stay: Payer: Medicare HMO

## 2021-05-15 ENCOUNTER — Inpatient Hospital Stay: Payer: Medicare HMO | Attending: Internal Medicine

## 2021-05-15 ENCOUNTER — Inpatient Hospital Stay (HOSPITAL_BASED_OUTPATIENT_CLINIC_OR_DEPARTMENT_OTHER): Payer: Medicare HMO | Admitting: Internal Medicine

## 2021-05-15 ENCOUNTER — Encounter: Payer: Self-pay | Admitting: Internal Medicine

## 2021-05-15 VITALS — BP 175/104 | HR 56 | Resp 19

## 2021-05-15 DIAGNOSIS — I129 Hypertensive chronic kidney disease with stage 1 through stage 4 chronic kidney disease, or unspecified chronic kidney disease: Secondary | ICD-10-CM | POA: Diagnosis not present

## 2021-05-15 DIAGNOSIS — Z79899 Other long term (current) drug therapy: Secondary | ICD-10-CM | POA: Insufficient documentation

## 2021-05-15 DIAGNOSIS — C20 Malignant neoplasm of rectum: Secondary | ICD-10-CM | POA: Insufficient documentation

## 2021-05-15 DIAGNOSIS — D696 Thrombocytopenia, unspecified: Secondary | ICD-10-CM | POA: Diagnosis not present

## 2021-05-15 DIAGNOSIS — N183 Chronic kidney disease, stage 3 unspecified: Secondary | ICD-10-CM | POA: Diagnosis not present

## 2021-05-15 DIAGNOSIS — Z7982 Long term (current) use of aspirin: Secondary | ICD-10-CM | POA: Diagnosis not present

## 2021-05-15 DIAGNOSIS — Z87891 Personal history of nicotine dependence: Secondary | ICD-10-CM | POA: Insufficient documentation

## 2021-05-15 DIAGNOSIS — Z5112 Encounter for antineoplastic immunotherapy: Secondary | ICD-10-CM | POA: Diagnosis not present

## 2021-05-15 DIAGNOSIS — Z7902 Long term (current) use of antithrombotics/antiplatelets: Secondary | ICD-10-CM | POA: Insufficient documentation

## 2021-05-15 DIAGNOSIS — Z5111 Encounter for antineoplastic chemotherapy: Secondary | ICD-10-CM | POA: Diagnosis not present

## 2021-05-15 DIAGNOSIS — C786 Secondary malignant neoplasm of retroperitoneum and peritoneum: Secondary | ICD-10-CM | POA: Diagnosis not present

## 2021-05-15 DIAGNOSIS — D509 Iron deficiency anemia, unspecified: Secondary | ICD-10-CM | POA: Insufficient documentation

## 2021-05-15 DIAGNOSIS — C184 Malignant neoplasm of transverse colon: Secondary | ICD-10-CM | POA: Insufficient documentation

## 2021-05-15 DIAGNOSIS — C787 Secondary malignant neoplasm of liver and intrahepatic bile duct: Secondary | ICD-10-CM | POA: Insufficient documentation

## 2021-05-15 DIAGNOSIS — K8020 Calculus of gallbladder without cholecystitis without obstruction: Secondary | ICD-10-CM | POA: Diagnosis not present

## 2021-05-15 LAB — URINALYSIS, COMPLETE (UACMP) WITH MICROSCOPIC
Bilirubin Urine: NEGATIVE
Glucose, UA: NEGATIVE mg/dL
Hgb urine dipstick: NEGATIVE
Ketones, ur: NEGATIVE mg/dL
Leukocytes,Ua: NEGATIVE
Nitrite: NEGATIVE
Protein, ur: NEGATIVE mg/dL
Specific Gravity, Urine: 1.012 (ref 1.005–1.030)
Squamous Epithelial / HPF: NONE SEEN (ref 0–5)
pH: 6 (ref 5.0–8.0)

## 2021-05-15 LAB — COMPREHENSIVE METABOLIC PANEL
ALT: 24 U/L (ref 0–44)
AST: 29 U/L (ref 15–41)
Albumin: 3.4 g/dL — ABNORMAL LOW (ref 3.5–5.0)
Alkaline Phosphatase: 230 U/L — ABNORMAL HIGH (ref 38–126)
Anion gap: 4 — ABNORMAL LOW (ref 5–15)
BUN: 18 mg/dL (ref 8–23)
CO2: 24 mmol/L (ref 22–32)
Calcium: 8.5 mg/dL — ABNORMAL LOW (ref 8.9–10.3)
Chloride: 106 mmol/L (ref 98–111)
Creatinine, Ser: 1.55 mg/dL — ABNORMAL HIGH (ref 0.61–1.24)
GFR, Estimated: 46 mL/min — ABNORMAL LOW (ref >60)
Glucose, Bld: 99 mg/dL (ref 70–99)
Potassium: 4.2 mmol/L (ref 3.5–5.1)
Sodium: 134 mmol/L — ABNORMAL LOW (ref 135–145)
Total Bilirubin: 0.9 mg/dL (ref 0.3–1.2)
Total Protein: 6.6 g/dL (ref 6.5–8.1)

## 2021-05-15 LAB — CBC WITH DIFFERENTIAL/PLATELET
Abs Immature Granulocytes: 0.02 10*3/uL (ref 0.00–0.07)
Basophils Absolute: 0.1 10*3/uL (ref 0.0–0.1)
Basophils Relative: 1 %
Eosinophils Absolute: 0.4 10*3/uL (ref 0.0–0.5)
Eosinophils Relative: 6 %
HCT: 44.6 % (ref 39.0–52.0)
Hemoglobin: 14.4 g/dL (ref 13.0–17.0)
Immature Granulocytes: 0 %
Lymphocytes Relative: 21 %
Lymphs Abs: 1.5 10*3/uL (ref 0.7–4.0)
MCH: 33.6 pg (ref 26.0–34.0)
MCHC: 32.3 g/dL (ref 30.0–36.0)
MCV: 104 fL — ABNORMAL HIGH (ref 80.0–100.0)
Monocytes Absolute: 0.8 10*3/uL (ref 0.1–1.0)
Monocytes Relative: 11 %
Neutro Abs: 4.2 10*3/uL (ref 1.7–7.7)
Neutrophils Relative %: 61 %
Platelets: 96 10*3/uL — ABNORMAL LOW (ref 150–400)
RBC: 4.29 MIL/uL (ref 4.22–5.81)
RDW: 15.8 % — ABNORMAL HIGH (ref 11.5–15.5)
WBC: 7 10*3/uL (ref 4.0–10.5)
nRBC: 0 % (ref 0.0–0.2)

## 2021-05-15 MED ORDER — SODIUM CHLORIDE 0.9 % IV SOLN
2400.0000 mg/m2 | INTRAVENOUS | Status: DC
  Administered 2021-05-15: 4650 mg via INTRAVENOUS
  Filled 2021-05-15: qty 93

## 2021-05-15 MED ORDER — SODIUM CHLORIDE 0.9 % IV SOLN
10.0000 mg/kg | INTRAVENOUS | Status: DC
  Administered 2021-05-15: 800 mg via INTRAVENOUS
  Filled 2021-05-15: qty 32

## 2021-05-15 MED ORDER — SODIUM CHLORIDE 0.9 % IV SOLN
10.0000 mg | Freq: Once | INTRAVENOUS | Status: AC
  Administered 2021-05-15: 10 mg via INTRAVENOUS
  Filled 2021-05-15: qty 10

## 2021-05-15 MED ORDER — DULOXETINE HCL 30 MG PO CPEP: 30.0000 mg | ORAL_CAPSULE | Freq: Every day | ORAL | 3 refills | Status: DC

## 2021-05-15 MED ORDER — SODIUM CHLORIDE 0.9 % IV SOLN
Freq: Once | INTRAVENOUS | Status: AC
  Filled 2021-05-15: qty 250

## 2021-05-15 NOTE — Progress Notes (Signed)
Pt states he doesn't have much balance and hands and feet are red since last treatment. ? ?Having some tightness in hands when closing them x3 weeks. ? ?Seeing some blood in his nose since last treatment. ? ?Having trouble zipping pants. ? ?

## 2021-05-15 NOTE — Assessment & Plan Note (Addendum)
#   STAGE IV- Synchronous primaries a] rectal cancer-10 cm-adenocarcinoma & b] transverse colon-- intramucosal adenoca]; abdominal lymphadenopathy; liver metastases; omental metastases.  S/p 12 cycles of FOLFOX plus Bev- CT scan -MARCH 2nd, 2023- Decreased size of a mental soft tissue nodule measuring 1.1 x 0.6 cm [previously- 1.6 x 1.2 cm.? ? ??# proceed with 5FU+Avastin #16  Dscontinued oxaliplatin [given PN/thrombocytopeniasee below] continue BEV [see below]  Labs today reviewed;  acceptable for treatment today. Plan CT CAP in 1 month.  ? ?# Anemia/iron deficiency hemoglobin ~13-  secondary to chronic GI bleeding/tumor-STABLE ? ?# Thrombocytopenia/secondary chemotherapy/on Plavix-aspirin-platelets- 96s-monitor closely. ? ?# HTN- 180/102; at home BP reviewed- 130-150/ DBP80-90s.continue checking BP at home/reviewed the log from home.  Proceed with avastin today. STABLE.  ? ?# PN G-2-3 sec to Oxaliplatin [last 04/02/2021]. discontinued oxaliplatin for now. START cymbalta 30 mg q day.  ? ?# Chronic kidney disease stage III-[GFR-46 ] -stable.  Continue keeping up with hydration.  ? ?* medical records- o? ?# DISPOSITION:   ?# 5FU+Bev today;  ?# Follow up in 4 weeks- MD: labs- cbc/cmp;UA CEA;  5FU+ bev; Pump off in 2 days; CTAP- Dr.B; ? ? ? ? ?

## 2021-05-15 NOTE — Progress Notes (Signed)
[place] ?CONSULT NOTE ? ?Patient Care Team: ?[name], MD as PCP - General (Family Medicine) ?Clent Jacks, RN as Oncology Nurse Navigator ? ?CHIEF COMPLAINTS/PURPOSE OF CONSULTATION: colon/rectal cancer ? ?#  ?Oncology History Overview Note  ?# Malignant partially obstructing tumor in the transverse colon/70 cm proximal to the anus- C. COLON MASS, 70 CM; COLD BIOPSY:  ?- INTRAMUCOSAL ADENOCARCINOMA AT LEAST; ? ?# One 20 mm polyp in the transverse colon, removed with mucosal resection. Resected and retrieved. Clips were ?placed. Tattooed. ? ?# tumor in the mid rectum and at 10 cm proximal to the anus. Biopsied. ? ? ? ? ? SEE COMMENT.  ? ?Comment:  ?There is no definitive evidence of invasion in this sample.  The  ?findings may not accurately represent the entire underlying lesion;  ?clinical correlation is recommended.  ? ?D. COLON POLYP, TRANSVERSE; HOT SNARE:  ?- TUBULOVILLOUS ADENOMA.  ?- NEGATIVE FOR HIGH GRADE DYSPLASIA AND MALIGNANCY.  ? ?E. RECTUM MASS; COLD BIOPSY:  ?- INVASIVE ADENOCARCINOMA, MODERATELY TO POORLY DIFFERENTIATED. ?----------------------------------   ?# PET scan: SEP 2022-proximal right colon [adjacent mesenteric lymph nodes] and rectal hypermetabolism.  Additional subcentimeter bilateral hypermetabolic lymphadenopathy noted in the retroperitoneal/left external iliac; inferior right lobe of the liver concerning for metastatic disease; right lower quadrant soft tissue mass concerning for peritoneal carcinomatosis; bilateral solid pulmonary nodules ~5 mm; MRI rectum- T stage: T4a; N stage:  N1.  ? ?# 09/12-2020- FOLFOX chemo [Dr.White/Dr.Vanga]; ADDED BEV with cycle #3-DC 5-FU bolus+LV; # 6-oxaliplatin dose reduced by 20%[thrombocytopenia];  starting cycle #13-will discontinue oxaliplatin [given PN-G-12;/thrombocytopenia] ?  ?Rectal cancer ([place])  ?09/11/2020 Initial Diagnosis  ? Rectal cancer [facility]) ?  ?09/25/2020 -  Chemotherapy  ? Patient is on Treatment Plan :  COLORECTAL FOLFOX q14d x 4 months  ? ?  ?  ?09/28/2020 Cancer Staging  ? Staging form: Colon and Rectum, AJCC 8th Edition ?- Clinical: Stage IVC (cT4a, cN1, cM1c) - Signed by Cammie Sickle, MD on 09/28/2020 ? ?  ? Genetic Testing  ? Negative genetic testing. No pathogenic variants identified on the [employer] Multi-Cancer+RNA Panel. The report date is 11/05/2020. ? ?The Multi-Cancer Panel + RNA offered by [facility] includes sequencing and/or deletion duplication testing of the following 84 genes: AIP, ALK, APC, ATM, AXIN2,BAP1,  BARD1, BLM, BMPR1A, BRCA1, BRCA2, BRIP1, CASR, CDC73, CDH1, CDK4, CDKN1B, CDKN1C, CDKN2A (p14ARF), CDKN2A (p16INK4a), CEBPA, CHEK2, CTNNA1, DICER1, DIS3L2, EGFR (c.2369C>T, p.Thr790Met variant only), EPCAM (Deletion/duplication testing only), FH, FLCN, GATA2, GPC3, GREM1 (Promoter region deletion/duplication testing only), HOXB13 (c.251G>A, p.Gly84Glu), HRAS, KIT, MAX, MEN1, MET, MITF (c.952G>A, p.Glu318Lys variant only), MLH1, MSH2, MSH3, MSH6, MUTYH, NBN, NF1, NF2, NTHL1, PALB2, PDGFRA, PHOX2B, PMS2, POLD1, POLE, POT1, PRKAR1A, PTCH1, PTEN, RAD50, RAD51C, RAD51D, RB1, RECQL4, RET, RUNX1, SDHAF2, SDHA (sequence changes only), SDHB, SDHC, SDHD, SMAD4, SMARCA4, SMARCB1, SMARCE1, STK11, SUFU, TERC, TERT, TMEM127, TP53, TSC1, TSC2, VHL, WRN and WT1. ?  ? ? ?HISTORY OF PRESENTING ILLNESS: Ambulating independently.  Accompanied by daughter. ? ?Albert Giles 76 y.o.  male synchronous colon cancer/rectal cancer-stage IV-on Haze Rushing is here for follow-up. ? ?Patient continues to complain of tingling and numbness in his fingertips and toes.  Concerned about balance.  Progressively getting worse.  ? ?Otherwise denies any headaches.  Denies any nosebleeds.  No diarrhea.  No constipation.  Complains of mild fatigue.  ? ?Review of Systems  ?Constitutional:  Negative for chills, diaphoresis, fever and weight loss.  ?HENT:  Negative for nosebleeds and sore throat.   ?Eyes:  Negative for double vision.   ?Respiratory:  Negative for cough, hemoptysis, sputum production, shortness of breath and wheezing.   ?Cardiovascular:  Negative for chest pain, palpitations, orthopnea and leg swelling.  ?Gastrointestinal:  Negative for abdominal pain, blood in stool, constipation, diarrhea, heartburn, melena, nausea and vomiting.  ?Genitourinary:  Negative for dysuria, frequency and urgency.  ?Skin: Negative.  Negative for itching and rash.  ?Neurological:  Positive for tingling and sensory change. Negative for dizziness, focal weakness, weakness and headaches.  ?Endo/Heme/Allergies:  Does not bruise/bleed easily.  ?Psychiatric/Behavioral:  Negative for depression. The patient is not nervous/anxious and does not have insomnia.    ? ?MEDICAL HISTORY:  ?Past Medical History:  ?Diagnosis Date  ? CAD (coronary artery disease) 06/2014  ? UA with NSTEMI - cath with 99% prox L circ s/p stent, EF 40% ([facility], [place] at [facility])  ? Emphysema   ? mild  ? Ex-smoker   ? Family history of breast cancer   ? Family history of colon cancer   ? Family history of pancreatic cancer   ? NSTEMI (non-ST elevated myocardial infarction) ([place]) 07/13/2014  ? ? ?SURGICAL HISTORY: ?Past Surgical History:  ?Procedure Laterality Date  ? CARDIAC CATHETERIZATION N/A 07/14/2014  ? Left Heart Cath and Coronary Angiography with stent placement;  Surgeon: Teodoro Spray, MD  ? CARDIAC CATHETERIZATION N/A 07/14/2014  ? Coronary Stent Intervention;  Surgeon: Yolonda Kida, MD  ? COLONOSCOPY WITH PROPOFOL N/A 09/06/2020  ? Procedure: COLONOSCOPY WITH PROPOFOL;  Surgeon: Lin Landsman, MD;  [facility] END[facility]COPY;  Service: Gastroenterology;  Laterality: N/A;  ? CYSTECTOMY    ? on back  ? ESOPHAGOGASTRODUODENOSCOPY N/A 09/06/2020  ? Procedure: ESOPHAGOGASTRODUODENOSCOPY (EGD);  Surgeon: Lin Landsman, MD;  Location: [facility] ENDOSCOPY;  Service: Gastroenterology;  Laterality: N/A;  ? INGUINAL HERNIA REPAIR Right 08/17/04  ? IR IMAGING GUIDED PORT INSERTION   09/13/2020  ? ? ?SOCIAL HISTORY: ?Social History  ? ?Socioeconomic History  ? Marital status: Married  ?  Spouse name: Not on file  ? Number of children: Not on file  ? Years of education: Not on file  ? Highest education level: Not on file  ?Occupational History  ? Not on file  ?Tobacco Use  ? Smoking status: Former  ?  Packs/day: 1.00  ?  Years: 35.00  ?  Pack years: 35.00  ?  Types: Cigarettes  ?  Quit date: 07/07/1998  ?  Years since quitting: 22.8  ? Smokeless tobacco: Never  ?Vaping Use  ? Vaping Use: Never used  ?Substance and Sexual Activity  ? Alcohol use: No  ? Drug use: No  ? Sexual activity: Yes  ?Other Topics Concern  ? Not on file  ?Social History Narrative  ? Caffeine: 2 cups coffee, 2 cups tea/day  ? Lives with wife  ? Occupation: Higher education careers adviser, retired  ? Edu: HS  ? Activity: works in yard, walking  ? Diet: some water, fruits/vegetables daily  ? -------------------------------------------------------------------   ?   ? 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   ? ?Social Determinants of Health  ? ?Financial Resource Strain: Low Risk   ? Difficulty of Paying Living Expenses: Not hard at all  ?Food Insecurity: No Food Insecurity  ? Worried About Charity fundraiser in the Last Year: Never true  ? Ran Out of Food in the Last Year: Never true  ?Transportation Needs: No Transportation Needs  ?  Lack of Transportation (Medical): No  ? Lack of Transportation (Non-Medical): No  ?Physical Activity: Inactive  ? Days of Exercise per Week: 0 days  ? Minutes of Exercise per Session: 0 min  ?Stress: No Stress Concern Present  ? Feeling of Stress : Not at all  ?Social Connections: Not on file  ?Intimate Partner Violence: Not At Risk  ? Fear of Current or Ex-Partner: No  ? Emotionally Abused: No  ? Physically Abused: No  ? Sexually Abused: No  ? ? ?FAMILY HISTORY: ?Family History  ?Problem Relation Age of Onset  ? Cancer Mother 101  ?      colon  ? Cancer Sister   ?     breast  ? Crohn's disease Sister   ? Cancer Daughter   ?     anal  ? Crohn's disease Niece   ? CAD Neg Hx   ? Stroke Neg Hx   ? Diabetes Neg Hx   ? Prostate cancer Neg Hx   ? K

## 2021-05-15 NOTE — Patient Instructions (Signed)
[facility]CER [facility] AT [place]  Discharge Instructions: ?Thank you for choosing [facility] to provide your oncology and hematology care.  ?If you have a lab appointment with the [facility], please go directly to the [facility] and check in at the registration area. ? ?Wear comfortable clothing and clothing appropriate for easy access to any Portacath or PICC line.  ? ?We strive to give you quality time with your provider. You may need to reschedule your appointment if you arrive late (15 or more minutes).  Arriving late affects you and other patients whose appointments are after yours.  Also, if you miss three or more appointments without notifying the office, you may be dismissed from the clinic at the provider?s discretion.    ?  ?For prescription refill requests, have your pharmacy contact our office and allow 72 hours for refills to be completed.   ? ?Today you received the following chemotherapy and/or immunotherapy agents Avastn, Adrucil  ?  ?To help prevent nausea and vomiting after your treatment, we encourage you to take your nausea medication as directed. ? ?BELOW ARE SYMPTOMS THAT SHOULD BE REPORTED IMMEDIATELY: ?*FEVER GREATER THAN 100.4 F (38 ?C) OR HIGHER ?*CHILLS OR SWEATING ?*NAUSEA AND VOMITING THAT IS NOT CONTROLLED WITH YOUR NAUSEA MEDICATION ?*UNUSUAL SHORTNESS OF BREATH ?*UNUSUAL BRUISING OR BLEEDING ?*URINARY PROBLEMS (pain or burning when urinating, or frequent urination) ?*BOWEL PROBLEMS (unusual diarrhea, constipation, pain near the anus) ?TENDERNESS IN MOUTH AND THROAT WITH OR WITHOUT PRESENCE OF ULCERS (sore throat, sores in mouth, or a toothache) ?UNUSUAL RASH, SWELLING OR PAIN  ?UNUSUAL VAGINAL DISCHARGE OR ITCHING  ? ?Items with * indicate a potential emergency and should be followed up as soon as possible or go to the Emergency Department if any problems should occur. ? ?Please show the CHEMOTHERAPY ALERT CARD or IMMUNOTHERAPY ALERT CARD at check-in  to the Emergency Department and triage nurse. ? ?Should you have questions after your visit or need to cancel or reschedule your appointment, please contact [facility] CANCER [place] AT [place]  [phone] and follow the prompts.  Office hours are 8:00 a.m. to 4:30 p.m. Monday - Friday. Please note that voicemails left after 4:00 p.m. may not be returned until the following business day.  We are closed weekends and major holidays. You have access to a nurse at all times for urgent questions. Please call the main number to the clinic [phone] and follow the prompts. ? ?For any non-urgent questions, you may also contact your provider using MyChart. We now offer e-Visits for anyone 33 and older to request care online for non-urgent symptoms. For details visit [email or web address]. ?  ?Also download the MyChart app! Go to the app store, search "MyChart", open the app, select [facility], and log in with your MyChart username and password. ? ?Due to Covid, a mask is required upon entering the hospital/clinic. If you do not have a mask, one will be given to you upon arrival. For doctor visits, patients may have 1 support person aged 2 or older with them. For treatment visits, patients cannot have anyone with them due to current Covid guidelines and our immunocompromised population.  ?

## 2021-05-15 NOTE — Progress Notes (Signed)
Per MD okay to proceed with treatment with BP 180/100. Treatment team updated.  ? ?Bailey Kolbe  ? ?

## 2021-05-16 LAB — CEA: CEA: 3 ng/mL (ref 0.0–4.7)

## 2021-05-17 ENCOUNTER — Inpatient Hospital Stay: Payer: Medicare HMO

## 2021-05-17 VITALS — BP 160/90 | HR 80 | Resp 18

## 2021-05-17 DIAGNOSIS — Z87891 Personal history of nicotine dependence: Secondary | ICD-10-CM | POA: Diagnosis not present

## 2021-05-17 DIAGNOSIS — Z5112 Encounter for antineoplastic immunotherapy: Secondary | ICD-10-CM | POA: Diagnosis not present

## 2021-05-17 DIAGNOSIS — D696 Thrombocytopenia, unspecified: Secondary | ICD-10-CM | POA: Diagnosis not present

## 2021-05-17 DIAGNOSIS — K8020 Calculus of gallbladder without cholecystitis without obstruction: Secondary | ICD-10-CM | POA: Diagnosis not present

## 2021-05-17 DIAGNOSIS — Z7982 Long term (current) use of aspirin: Secondary | ICD-10-CM | POA: Diagnosis not present

## 2021-05-17 DIAGNOSIS — C20 Malignant neoplasm of rectum: Secondary | ICD-10-CM | POA: Diagnosis not present

## 2021-05-17 DIAGNOSIS — N183 Chronic kidney disease, stage 3 unspecified: Secondary | ICD-10-CM | POA: Diagnosis not present

## 2021-05-17 DIAGNOSIS — C184 Malignant neoplasm of transverse colon: Secondary | ICD-10-CM | POA: Diagnosis not present

## 2021-05-17 DIAGNOSIS — Z7902 Long term (current) use of antithrombotics/antiplatelets: Secondary | ICD-10-CM | POA: Diagnosis not present

## 2021-05-17 DIAGNOSIS — D509 Iron deficiency anemia, unspecified: Secondary | ICD-10-CM | POA: Diagnosis not present

## 2021-05-17 DIAGNOSIS — I129 Hypertensive chronic kidney disease with stage 1 through stage 4 chronic kidney disease, or unspecified chronic kidney disease: Secondary | ICD-10-CM | POA: Diagnosis not present

## 2021-05-17 DIAGNOSIS — C787 Secondary malignant neoplasm of liver and intrahepatic bile duct: Secondary | ICD-10-CM | POA: Diagnosis not present

## 2021-05-17 DIAGNOSIS — Z79899 Other long term (current) drug therapy: Secondary | ICD-10-CM | POA: Diagnosis not present

## 2021-05-17 DIAGNOSIS — C786 Secondary malignant neoplasm of retroperitoneum and peritoneum: Secondary | ICD-10-CM | POA: Diagnosis not present

## 2021-05-17 DIAGNOSIS — Z5111 Encounter for antineoplastic chemotherapy: Secondary | ICD-10-CM | POA: Diagnosis not present

## 2021-05-17 MED ORDER — SODIUM CHLORIDE 0.9% FLUSH
10.0000 mL | INTRAVENOUS | Status: DC | PRN
  Administered 2021-05-17: 10 mL
  Filled 2021-05-17: qty 10

## 2021-05-17 MED ORDER — HEPARIN SOD (PORK) LOCK FLUSH 100 UNIT/ML IV SOLN
500.0000 [IU] | Freq: Once | INTRAVENOUS | Status: AC | PRN
  Administered 2021-05-17: 500 [IU]
  Filled 2021-05-17: qty 5

## 2021-05-22 ENCOUNTER — Encounter: Payer: Self-pay | Admitting: Internal Medicine

## 2021-05-25 DIAGNOSIS — C20 Malignant neoplasm of rectum: Secondary | ICD-10-CM | POA: Diagnosis not present

## 2021-06-05 ENCOUNTER — Telehealth: Payer: Self-pay | Admitting: Family Medicine

## 2021-06-05 NOTE — Telephone Encounter (Signed)
Opened in error

## 2021-06-07 ENCOUNTER — Ambulatory Visit
Admission: RE | Admit: 2021-06-07 | Discharge: 2021-06-07 | Disposition: A | Discharge status: Home / Self Care | Payer: Medicare HMO | Source: Ambulatory Visit | Attending: Internal Medicine | Admitting: Internal Medicine

## 2021-06-07 DIAGNOSIS — K6389 Other specified diseases of intestine: Secondary | ICD-10-CM | POA: Diagnosis not present

## 2021-06-07 DIAGNOSIS — K8020 Calculus of gallbladder without cholecystitis without obstruction: Secondary | ICD-10-CM | POA: Diagnosis not present

## 2021-06-07 DIAGNOSIS — I2510 Atherosclerotic heart disease of native coronary artery without angina pectoris: Secondary | ICD-10-CM | POA: Diagnosis not present

## 2021-06-07 DIAGNOSIS — Z85038 Personal history of other malignant neoplasm of large intestine: Secondary | ICD-10-CM | POA: Diagnosis not present

## 2021-06-07 DIAGNOSIS — C20 Malignant neoplasm of rectum: Secondary | ICD-10-CM | POA: Insufficient documentation

## 2021-06-07 DIAGNOSIS — J439 Emphysema, unspecified: Secondary | ICD-10-CM | POA: Diagnosis not present

## 2021-06-07 DIAGNOSIS — K449 Diaphragmatic hernia without obstruction or gangrene: Secondary | ICD-10-CM | POA: Diagnosis not present

## 2021-06-07 MED ORDER — IOHEXOL 300 MG/ML  SOLN
80.0000 mL | Freq: Once | INTRAMUSCULAR | Status: AC | PRN
  Administered 2021-06-07: 80 mL via INTRAVENOUS

## 2021-06-08 MED FILL — Fluorouracil IV Soln 5 GM/100ML (50 MG/ML): INTRAVENOUS | Qty: 93 | Status: AC

## 2021-06-08 MED FILL — Dexamethasone Sodium Phosphate Inj 100 MG/10ML: INTRAMUSCULAR | Qty: 1 | Status: AC

## 2021-06-11 ENCOUNTER — Other Ambulatory Visit: Payer: Self-pay | Admitting: Gastroenterology

## 2021-06-12 ENCOUNTER — Encounter: Payer: Self-pay | Admitting: Internal Medicine

## 2021-06-12 ENCOUNTER — Inpatient Hospital Stay: Payer: Medicare HMO

## 2021-06-12 ENCOUNTER — Other Ambulatory Visit: Payer: Self-pay | Admitting: Gastroenterology

## 2021-06-12 ENCOUNTER — Inpatient Hospital Stay (HOSPITAL_BASED_OUTPATIENT_CLINIC_OR_DEPARTMENT_OTHER): Payer: Medicare HMO | Admitting: Internal Medicine

## 2021-06-12 DIAGNOSIS — I129 Hypertensive chronic kidney disease with stage 1 through stage 4 chronic kidney disease, or unspecified chronic kidney disease: Secondary | ICD-10-CM | POA: Diagnosis not present

## 2021-06-12 DIAGNOSIS — D509 Iron deficiency anemia, unspecified: Secondary | ICD-10-CM | POA: Diagnosis not present

## 2021-06-12 DIAGNOSIS — N183 Chronic kidney disease, stage 3 unspecified: Secondary | ICD-10-CM | POA: Diagnosis not present

## 2021-06-12 DIAGNOSIS — C787 Secondary malignant neoplasm of liver and intrahepatic bile duct: Secondary | ICD-10-CM | POA: Diagnosis not present

## 2021-06-12 DIAGNOSIS — C20 Malignant neoplasm of rectum: Secondary | ICD-10-CM

## 2021-06-12 DIAGNOSIS — C786 Secondary malignant neoplasm of retroperitoneum and peritoneum: Secondary | ICD-10-CM | POA: Diagnosis not present

## 2021-06-12 DIAGNOSIS — Z7902 Long term (current) use of antithrombotics/antiplatelets: Secondary | ICD-10-CM | POA: Diagnosis not present

## 2021-06-12 DIAGNOSIS — Z7982 Long term (current) use of aspirin: Secondary | ICD-10-CM | POA: Diagnosis not present

## 2021-06-12 DIAGNOSIS — D696 Thrombocytopenia, unspecified: Secondary | ICD-10-CM | POA: Diagnosis not present

## 2021-06-12 DIAGNOSIS — C184 Malignant neoplasm of transverse colon: Secondary | ICD-10-CM | POA: Diagnosis not present

## 2021-06-12 DIAGNOSIS — Z87891 Personal history of nicotine dependence: Secondary | ICD-10-CM | POA: Diagnosis not present

## 2021-06-12 DIAGNOSIS — K8020 Calculus of gallbladder without cholecystitis without obstruction: Secondary | ICD-10-CM | POA: Diagnosis not present

## 2021-06-12 DIAGNOSIS — Z5112 Encounter for antineoplastic immunotherapy: Secondary | ICD-10-CM | POA: Diagnosis not present

## 2021-06-12 DIAGNOSIS — Z79899 Other long term (current) drug therapy: Secondary | ICD-10-CM | POA: Diagnosis not present

## 2021-06-12 DIAGNOSIS — Z5111 Encounter for antineoplastic chemotherapy: Secondary | ICD-10-CM | POA: Diagnosis not present

## 2021-06-12 LAB — CBC WITH DIFFERENTIAL/PLATELET
Abs Immature Granulocytes: 0.02 10*3/uL (ref 0.00–0.07)
Basophils Absolute: 0.1 10*3/uL (ref 0.0–0.1)
Basophils Relative: 1 %
Eosinophils Absolute: 0.5 10*3/uL (ref 0.0–0.5)
Eosinophils Relative: 8 %
HCT: 46.1 % (ref 39.0–52.0)
Hemoglobin: 15 g/dL (ref 13.0–17.0)
Immature Granulocytes: 0 %
Lymphocytes Relative: 23 %
Lymphs Abs: 1.6 10*3/uL (ref 0.7–4.0)
MCH: 33.5 pg (ref 26.0–34.0)
MCHC: 32.5 g/dL (ref 30.0–36.0)
MCV: 102.9 fL — ABNORMAL HIGH (ref 80.0–100.0)
Monocytes Absolute: 0.7 10*3/uL (ref 0.1–1.0)
Monocytes Relative: 10 %
Neutro Abs: 3.9 10*3/uL (ref 1.7–7.7)
Neutrophils Relative %: 58 %
Platelets: 99 10*3/uL — ABNORMAL LOW (ref 150–400)
RBC: 4.48 MIL/uL (ref 4.22–5.81)
RDW: 14.7 % (ref 11.5–15.5)
WBC: 6.8 10*3/uL (ref 4.0–10.5)
nRBC: 0 % (ref 0.0–0.2)

## 2021-06-12 LAB — URINALYSIS, COMPLETE (UACMP) WITH MICROSCOPIC
Bacteria, UA: NONE SEEN
Bilirubin Urine: NEGATIVE
Glucose, UA: NEGATIVE mg/dL
Hgb urine dipstick: NEGATIVE
Ketones, ur: NEGATIVE mg/dL
Leukocytes,Ua: NEGATIVE
Nitrite: NEGATIVE
Protein, ur: NEGATIVE mg/dL
Specific Gravity, Urine: 1.014 (ref 1.005–1.030)
pH: 6 (ref 5.0–8.0)

## 2021-06-12 LAB — COMPREHENSIVE METABOLIC PANEL
ALT: 32 U/L (ref 0–44)
AST: 38 U/L (ref 15–41)
Albumin: 3.4 g/dL — ABNORMAL LOW (ref 3.5–5.0)
Alkaline Phosphatase: 248 U/L — ABNORMAL HIGH (ref 38–126)
Anion gap: 5 (ref 5–15)
BUN: 13 mg/dL (ref 8–23)
CO2: 24 mmol/L (ref 22–32)
Calcium: 8.3 mg/dL — ABNORMAL LOW (ref 8.9–10.3)
Chloride: 107 mmol/L (ref 98–111)
Creatinine, Ser: 1.6 mg/dL — ABNORMAL HIGH (ref 0.61–1.24)
GFR, Estimated: 44 mL/min — ABNORMAL LOW (ref >60)
Glucose, Bld: 136 mg/dL — ABNORMAL HIGH (ref 70–99)
Potassium: 4.2 mmol/L (ref 3.5–5.1)
Sodium: 136 mmol/L (ref 135–145)
Total Bilirubin: 0.5 mg/dL (ref 0.3–1.2)
Total Protein: 6.6 g/dL (ref 6.5–8.1)

## 2021-06-12 MED ORDER — SODIUM CHLORIDE 0.9 % IV SOLN
2400.0000 mg/m2 | INTRAVENOUS | Status: DC
  Administered 2021-06-12: 4650 mg via INTRAVENOUS
  Filled 2021-06-12: qty 93

## 2021-06-12 MED ORDER — SODIUM CHLORIDE 0.9% FLUSH
10.0000 mL | Freq: Once | INTRAVENOUS | Status: AC
  Administered 2021-06-12: 10 mL via INTRAVENOUS
  Filled 2021-06-12: qty 10

## 2021-06-12 MED ORDER — SODIUM CHLORIDE 0.9 % IV SOLN
10.0000 mg/kg | INTRAVENOUS | Status: DC
  Administered 2021-06-12: 800 mg via INTRAVENOUS
  Filled 2021-06-12: qty 32

## 2021-06-12 MED ORDER — HEPARIN SOD (PORK) LOCK FLUSH 100 UNIT/ML IV SOLN
500.0000 [IU] | Freq: Once | INTRAVENOUS | Status: DC
  Filled 2021-06-12: qty 5

## 2021-06-12 MED ORDER — SODIUM CHLORIDE 0.9 % IV SOLN
10.0000 mg | Freq: Once | INTRAVENOUS | Status: AC
  Administered 2021-06-12: 10 mg via INTRAVENOUS
  Filled 2021-06-12: qty 10

## 2021-06-12 NOTE — Progress Notes (Signed)
Neuropathy in hands and feet stable.  Did stop the Cymbalta due to having no energy and feeling groggy.

## 2021-06-12 NOTE — Patient Instructions (Signed)
[facility] AT [place]  Discharge Instructions: Thank you for choosing [place] to provide your oncology and hematology care.  If you have a lab appointment with the [facility], please go directly to the [facility] and check in at the registration area.  Wear comfortable clothing and clothing appropriate for easy access to any Portacath or PICC line.   We strive to give you quality time with your provider. You may need to reschedule your appointment if you arrive late (15 or more minutes).  Arriving late affects you and other patients whose appointments are after yours.  Also, if you miss three or more appointments without notifying the office, you may be dismissed from the clinic at the provider's discretion.      For prescription refill requests, have your pharmacy contact our office and allow 72 hours for refills to be completed.    Today you received the following chemotherapy and/or immunotherapy agents MVASI and 5Fu      To help prevent nausea and vomiting after your treatment, we encourage you to take your nausea medication as directed.  BELOW ARE SYMPTOMS THAT SHOULD BE REPORTED IMMEDIATELY: *FEVER GREATER THAN 100.4 F (38 C) OR HIGHER *CHILLS OR SWEATING *NAUSEA AND VOMITING THAT IS NOT CONTROLLED WITH YOUR NAUSEA MEDICATION *UNUSUAL SHORTNESS OF BREATH *UNUSUAL BRUISING OR BLEEDING *URINARY PROBLEMS (pain or burning when urinating, or frequent urination) *BOWEL PROBLEMS (unusual diarrhea, constipation, pain near the anus) TENDERNESS IN MOUTH AND THROAT WITH OR WITHOUT PRESENCE OF ULCERS (sore throat, sores in mouth, or a toothache) UNUSUAL RASH, SWELLING OR PAIN  UNUSUAL VAGINAL DISCHARGE OR ITCHING   Items with * indicate a potential emergency and should be followed up as soon as possible or go to the Emergency Department if any problems should occur.  Please show the CHEMOTHERAPY ALERT CARD or IMMUNOTHERAPY ALERT CARD at check-in  to the Emergency Department and triage nurse.  Should you have questions after your visit or need to cancel or reschedule your appointment, please contact [facility] [place] AT [place]  [phone] and follow the prompts.  Office hours are 8:00 a.m. to 4:30 p.m. Monday - Friday. Please note that voicemails left after 4:00 p.m. may not be returned until the following business day.  We are closed weekends and major holidays. You have access to a nurse at all times for urgent questions. Please call the main number to the clinic [phone] and follow the prompts.  For any non-urgent questions, you may also contact your provider using MyChart. We now offer e-Visits for anyone 64 and older to request care online for non-urgent symptoms. For details visit [email or web address].   Also download the MyChart app! Go to the app store, search "MyChart", open the app, select , and log in with your MyChart username and password.  Due to Covid, a mask is required upon entering the hospital/clinic. If you do not have a mask, one will be given to you upon arrival. For doctor visits, patients may have 1 support person aged 89 or older with them. For treatment visits, patients cannot have anyone with them due to current Covid guidelines and our immunocompromised population.   Bevacizumab injection What is this medication? BEVACIZUMAB (be va SIZ yoo mab) is a monoclonal antibody. It is used to treat many types of cancer. This medicine may be used for other purposes; ask your health care provider or pharmacist if you have questions. COMMON BRAND NAME(S): Alymsys, Avastin, MVASI, Zirabev What should  I tell my care team before I take this medication? They need to know if you have any of these conditions: diabetes heart disease high blood pressure history of coughing up blood prior anthracycline chemotherapy (e.g., doxorubicin, daunorubicin, epirubicin) recent or ongoing radiation  therapy recent or planning to have surgery stroke an unusual or allergic reaction to bevacizumab, hamster proteins, mouse proteins, other medicines, foods, dyes, or preservatives pregnant or trying to get pregnant breast-feeding How should I use this medication? This medicine is for infusion into a vein. It is given by a health care professional in a hospital or clinic setting. Talk to your pediatrician regarding the use of this medicine in children. Special care may be needed. Overdosage: If you think you have taken too much of this medicine contact a poison control center or emergency room at once. NOTE: This medicine is only for you. Do not share this medicine with others. What if I miss a dose? It is important not to miss your dose. Call your doctor or health care professional if you are unable to keep an appointment. What may interact with this medication? Interactions are not expected. This list may not describe all possible interactions. Give your health care provider a list of all the medicines, herbs, non-prescription drugs, or dietary supplements you use. Also tell them if you smoke, drink alcohol, or use illegal drugs. Some items may interact with your medicine. What should I watch for while using this medication? Your condition will be monitored carefully while you are receiving this medicine. You will need important blood work and urine testing done while you are taking this medicine. This medicine may increase your risk to bruise or bleed. Call your doctor or health care professional if you notice any unusual bleeding. Before having surgery, talk to your health care provider to make sure it is ok. This drug can increase the risk of poor healing of your surgical site or wound. You will need to stop this drug for 28 days before surgery. After surgery, wait at least 28 days before restarting this drug. Make sure the surgical site or wound is healed enough before restarting this drug.  Talk to your health care provider if questions. Do not become pregnant while taking this medicine or for 6 months after stopping it. Women should inform their doctor if they wish to become pregnant or think they might be pregnant. There is a potential for serious side effects to an unborn child. Talk to your health care professional or pharmacist for more information. Do not breast-feed an infant while taking this medicine and for 6 months after the last dose. This medicine has caused ovarian failure in some women. This medicine may interfere with the ability to have a child. You should talk to your doctor or health care professional if you are concerned about your fertility. What side effects may I notice from receiving this medication? Side effects that you should report to your doctor or health care professional as soon as possible: allergic reactions like skin rash, itching or hives, swelling of the face, lips, or tongue chest pain or chest tightness chills coughing up blood high fever seizures severe constipation signs and symptoms of bleeding such as bloody or black, tarry stools; red or dark-brown urine; spitting up blood or brown material that looks like coffee grounds; red spots on the skin; unusual bruising or bleeding from the eye, gums, or nose signs and symptoms of a blood clot such as breathing problems; chest pain; severe, sudden  headache; pain, swelling, warmth in the leg signs and symptoms of a stroke like changes in vision; confusion; trouble speaking or understanding; severe headaches; sudden numbness or weakness of the face, arm or leg; trouble walking; dizziness; loss of balance or coordination stomach pain sweating swelling of legs or ankles vomiting weight gain Side effects that usually do not require medical attention (report to your doctor or health care professional if they continue or are bothersome): back pain changes in taste decreased appetite dry  skin nausea tiredness This list may not describe all possible side effects. Call your doctor for medical advice about side effects. You may report side effects to FDA at 1-800-FDA-1088. Where should I keep my medication? This drug is given in a hospital or clinic and will not be stored at home. NOTE: This sheet is a summary. It may not cover all possible information. If you have questions about this medicine, talk to your doctor, pharmacist, or health care provider.  2023 Elsevier/Gold Standard (2020-12-01 00:00:00)  Fluorouracil, 5-FU injection What is this medication? FLUOROURACIL, 5-FU (flure oh YOOR a sil) is a chemotherapy drug. It slows the growth of cancer cells. This medicine is used to treat many types of cancer like breast cancer, colon or rectal cancer, pancreatic cancer, and stomach cancer. This medicine may be used for other purposes; ask your health care provider or pharmacist if you have questions. COMMON BRAND NAME(S): Adrucil What should I tell my care team before I take this medication? They need to know if you have any of these conditions: blood disorders dihydropyrimidine dehydrogenase (DPD) deficiency infection (especially a virus infection such as chickenpox, cold sores, or herpes) kidney disease liver disease malnourished, poor nutrition recent or ongoing radiation therapy an unusual or allergic reaction to fluorouracil, other chemotherapy, other medicines, foods, dyes, or preservatives pregnant or trying to get pregnant breast-feeding How should I use this medication? This drug is given as an infusion or injection into a vein. It is administered in a hospital or clinic by a specially trained health care professional. Talk to your pediatrician regarding the use of this medicine in children. Special care may be needed. Overdosage: If you think you have taken too much of this medicine contact a poison control center or emergency room at once. NOTE: This medicine is  only for you. Do not share this medicine with others. What if I miss a dose? It is important not to miss your dose. Call your doctor or health care professional if you are unable to keep an appointment. What may interact with this medication? Do not take this medicine with any of the following medications: live virus vaccines This medicine may also interact with the following medications: medicines that treat or prevent blood clots like warfarin, enoxaparin, and dalteparin This list may not describe all possible interactions. Give your health care provider a list of all the medicines, herbs, non-prescription drugs, or dietary supplements you use. Also tell them if you smoke, drink alcohol, or use illegal drugs. Some items may interact with your medicine. What should I watch for while using this medication? Visit your doctor for checks on your progress. This drug may make you feel generally unwell. This is not uncommon, as chemotherapy can affect healthy cells as well as cancer cells. Report any side effects. Continue your course of treatment even though you feel ill unless your doctor tells you to stop. In some cases, you may be given additional medicines to help with side effects. Follow all directions for  their use. Call your doctor or health care professional for advice if you get a fever, chills or sore throat, or other symptoms of a cold or flu. Do not treat yourself. This drug decreases your body's ability to fight infections. Try to avoid being around people who are sick. This medicine may increase your risk to bruise or bleed. Call your doctor or health care professional if you notice any unusual bleeding. Be careful brushing and flossing your teeth or using a toothpick because you may get an infection or bleed more easily. If you have any dental work done, tell your dentist you are receiving this medicine. Avoid taking products that contain aspirin, acetaminophen, ibuprofen, naproxen, or  ketoprofen unless instructed by your doctor. These medicines may hide a fever. Do not become pregnant while taking this medicine. Women should inform their doctor if they wish to become pregnant or think they might be pregnant. There is a potential for serious side effects to an unborn child. Talk to your health care professional or pharmacist for more information. Do not breast-feed an infant while taking this medicine. Men should inform their doctor if they wish to father a child. This medicine may lower sperm counts. Do not treat diarrhea with over the counter products. Contact your doctor if you have diarrhea that lasts more than 2 days or if it is severe and watery. This medicine can make you more sensitive to the sun. Keep out of the sun. If you cannot avoid being in the sun, wear protective clothing and use sunscreen. Do not use sun lamps or tanning beds/booths. What side effects may I notice from receiving this medication? Side effects that you should report to your doctor or health care professional as soon as possible: allergic reactions like skin rash, itching or hives, swelling of the face, lips, or tongue low blood counts - this medicine may decrease the number of white blood cells, red blood cells and platelets. You may be at increased risk for infections and bleeding. signs of infection - fever or chills, cough, sore throat, pain or difficulty passing urine signs of decreased platelets or bleeding - bruising, pinpoint red spots on the skin, black, tarry stools, blood in the urine signs of decreased red blood cells - unusually weak or tired, fainting spells, lightheadedness breathing problems changes in vision chest pain mouth sores nausea and vomiting pain, swelling, redness at site where injected pain, tingling, numbness in the hands or feet redness, swelling, or sores on hands or feet stomach pain unusual bleeding Side effects that usually do not require medical attention  (report to your doctor or health care professional if they continue or are bothersome): changes in finger or toe nails diarrhea dry or itchy skin hair loss headache loss of appetite sensitivity of eyes to the light stomach upset unusually teary eyes This list may not describe all possible side effects. Call your doctor for medical advice about side effects. You may report side effects to FDA at 1-800-FDA-1088. Where should I keep my medication? This drug is given in a hospital or clinic and will not be stored at home. NOTE: This sheet is a summary. It may not cover all possible information. If you have questions about this medicine, talk to your doctor, pharmacist, or health care provider.  2023 Elsevier/Gold Standard (2020-12-01 00:00:00)

## 2021-06-12 NOTE — Progress Notes (Signed)
[facility] NOTE  Patient Care Team: [name], MD as PCP - General (Family Medicine) Clent Jacks, RN as Oncology Nurse Navigator Cammie Sickle, MD as Consulting Physician (Oncology)  CHIEF COMPLAINTS/PURPOSE OF CONSULTATION: colon/rectal cancer  #  Oncology History Overview Note  # Malignant partially obstructing tumor in the transverse colon/70 cm proximal to the anus- C. COLON MASS, 70 CM; COLD BIOPSY:  - INTRAMUCOSAL ADENOCARCINOMA AT LEAST;  # One 20 mm polyp in the transverse colon, removed with mucosal resection. Resected and retrieved. Clips were placed. Tattooed.  # tumor in the mid rectum and at 10 cm proximal to the anus. Biopsied.      SEE COMMENT.   Comment:  There is no definitive evidence of invasion in this sample.  The  findings may not accurately represent the entire underlying lesion;  clinical correlation is recommended.   D. COLON POLYP, TRANSVERSE; HOT SNARE:  - TUBULOVILLOUS ADENOMA.  - NEGATIVE FOR HIGH GRADE DYSPLASIA AND MALIGNANCY.   E. RECTUM MASS; COLD BIOPSY:  - INVASIVE ADENOCARCINOMA, MODERATELY TO POORLY DIFFERENTIATED. ----------------------------------   # PET scan: SEP 2022-proximal right colon [adjacent mesenteric lymph nodes] and rectal hypermetabolism.  Additional subcentimeter bilateral hypermetabolic lymphadenopathy noted in the retroperitoneal/left external iliac; inferior right lobe of the liver concerning for metastatic disease; right lower quadrant soft tissue mass concerning for peritoneal carcinomatosis; bilateral solid pulmonary nodules ~5 mm; MRI rectum- T stage: T4a; N stage:  N1.   # 09/12-2020- FOLFOX chemo [Dr.White/Dr.Vanga]; ADDED BEV with cycle #3-DC 5-FU bolus+LV; # 6-oxaliplatin dose reduced by 20%[thrombocytopenia]; LAST OX- FEB 20th, 2023- starting cycle #13-will discontinue oxaliplatin [given PN-G-12;/thrombocytopenia]  # MAY  27 th, 2023- CT scan-subcentimeter multiple  lung nodules concerning for for progression of disease.    Rectal cancer ([place])  09/11/2020 Initial Diagnosis   Rectal cancer ([place])   09/25/2020 -  Chemotherapy   Patient is on Treatment Plan : COLORECTAL FOLFOX q14d x 4 months      09/28/2020 Cancer Staging   Staging form: Colon and Rectum, AJCC 8th Edition - Clinical: Stage IVC (cT4a, cN1, cM1c) - Signed by Cammie Sickle, MD on 09/28/2020     Genetic Testing   Negative genetic testing. No pathogenic variants identified on the [facility] Multi-Cancer+RNA Panel. The report date is 11/05/2020.  The Multi-Cancer Panel + RNA offered by [facility] includes sequencing and/or deletion duplication testing of the following 84 genes: AIP, ALK, APC, ATM, AXIN2,BAP1,  BARD1, BLM, BMPR1A, BRCA1, BRCA2, BRIP1, CASR, CDC73, CDH1, CDK4, CDKN1B, CDKN1C, CDKN2A (p14ARF), CDKN2A (p16INK4a), CEBPA, CHEK2, CTNNA1, DICER1, DIS3L2, EGFR (c.2369C>T, p.Thr790Met variant only), EPCAM (Deletion/duplication testing only), FH, FLCN, GATA2, GPC3, GREM1 (Promoter region deletion/duplication testing only), HOXB13 (c.251G>A, p.Gly84Glu), HRAS, KIT, MAX, MEN1, MET, MITF (c.952G>A, p.Glu318Lys variant only), MLH1, MSH2, MSH3, MSH6, MUTYH, NBN, NF1, NF2, NTHL1, PALB2, PDGFRA, PHOX2B, PMS2, POLD1, POLE, POT1, PRKAR1A, PTCH1, PTEN, RAD50, RAD51C, RAD51D, RB1, RECQL4, RET, RUNX1, SDHAF2, SDHA (sequence changes only), SDHB, SDHC, SDHD, SMAD4, SMARCA4, SMARCB1, SMARCE1, STK11, SUFU, TERC, TERT, TMEM127, TP53, TSC1, TSC2, VHL, WRN and WT1.     HISTORY OF PRESENTING ILLNESS: Ambulating independently.  Accompanied by daughter.  [name] 76 y.o.  male synchronous colon cancer/rectal cancer-stage IV-on [facility] Rushing is here for follow-up/review results of the CT scan  Patient continues to complain of tingling and numbness in his fingertips and toes.  No falls.  Progressively getting worse.  No headaches.  No nosebleeds.  No diarrhea.  No constipation.  Complains of mild  fatigue.    Review of Systems  Constitutional:  Negative for chills, diaphoresis, fever and weight loss.  HENT:  Negative for nosebleeds and sore throat.   Eyes:  Negative for double vision.  Respiratory:  Negative for cough, hemoptysis, sputum production, shortness of breath and wheezing.   Cardiovascular:  Negative for chest pain, palpitations, orthopnea and leg swelling.  Gastrointestinal:  Negative for abdominal pain, blood in stool, constipation, diarrhea, heartburn, melena, nausea and vomiting.  Genitourinary:  Negative for dysuria, frequency and urgency.  Skin: Negative.  Negative for itching and rash.  Neurological:  Positive for tingling and sensory change. Negative for dizziness, focal weakness, weakness and headaches.  Endo/Heme/Allergies:  Does not bruise/bleed easily.  Psychiatric/Behavioral:  Negative for depression. The patient is not nervous/anxious and does not have insomnia.     MEDICAL HISTORY:  Past Medical History:  Diagnosis Date  . CAD (coronary artery disease) 06/2014   UA with NSTEMI - cath with 99% prox L circ s/p stent, EF 40% ([place], [place] at [facility])  . Emphysema    mild  . Ex-smoker   . Family history of breast cancer   . Family history of colon cancer   . Family history of pancreatic cancer   . NSTEMI (non-ST elevated myocardial infarction) ([place]) 07/13/2014    SURGICAL HISTORY: Past Surgical History:  Procedure Laterality Date  . CARDIAC CATHETERIZATION N/A 07/14/2014   Left Heart Cath and Coronary Angiography with stent placement;  Surgeon: Teodoro Spray, MD  . CARDIAC CATHETERIZATION N/A 07/14/2014   Coronary Stent Intervention;  Surgeon: Yolonda Kida, MD  . COLONOSCOPY WITH PROPOFOL N/A 09/06/2020   Procedure: COLONOSCOPY WITH PROPOFOL;  Surgeon: Lin Landsman, MD;  Location: [facility] ENDOSCOPY;  Service: Gastroenterology;  Laterality: N/A;  . CYSTECTOMY     on back  . ESOPHAGOGASTRODUODENOSCOPY N/A 09/06/2020   Procedure: ESOPHAGOGASTRODUODENOSCOPY  (EGD);  Surgeon: Lin Landsman, MD;  Location: [facility] ENDOSCOPY;  Service: Gastroenterology;  Laterality: N/A;  . INGUINAL HERNIA REPAIR Right 08/17/04  . IR IMAGING GUIDED PORT INSERTION  09/13/2020    SOCIAL HISTORY: Social History   Socioeconomic History  . Marital status: Married    Spouse name: Not on file  . Number of children: Not on file  . Years of education: Not on file  . Highest education level: Not on file  Occupational History  . Not on file  Tobacco Use  . Smoking status: Former    Packs/day: 1.00    Years: 35.00    Pack years: 35.00    Types: Cigarettes    Quit date: 07/07/1998    Years since quitting: 22.9  . Smokeless tobacco: Never  Vaping Use  . Vaping Use: Never used  Substance and Sexual Activity  . Alcohol use: No  . Drug use: No  . Sexual activity: Yes  Other Topics Concern  . 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    Social Determinants of Health   Financial Resource Strain: Low Risk   . Difficulty of Paying Living Expenses: Not hard at all  Food Insecurity: No Food Insecurity  . Worried  About Running Out of Food in the Last Year: Never true  . Ran Out of Food in the Last Year: Never true  Transportation Needs: No Transportation Needs  . Lack of Transportation (Medical): No  . Lack of Transportation (Non-Medical): No  Physical Activity: Inactive  . Days of Exercise per Week: 0 days  . Minutes of Exercise per Session: 0 min  Stress: No Stress Concern Present  . Feeling of Stress : Not at all  Social Connections: Not on file  Intimate Partner Violence: Not  At Risk  . Fear of Current or Ex-Partner: No  . Emotionally Abused: No  . Physically Abused: No  . Sexually Abused: No    FAMILY HISTORY: Family History  Problem Relation Age of Onset  . Cancer Mother 67       colon  . Cancer Sister        breast  . Crohn's disease Sister   . Cancer Daughter        anal  . Crohn's disease Niece   . CAD Neg Hx   . Stroke Neg Hx   . Diabetes Neg Hx   . Prostate cancer Neg Hx   . Kidney cancer Neg Hx   . Bladder Cancer Neg Hx     ALLERGIES:  has No Known Allergies.  MEDICATIONS:  Current Outpatient Medications  Medication Sig Dispense Refill  . aspirin EC 81 MG tablet Take 1 tablet (81 mg total) by mouth daily. 60 tablet 1  . atorvastatin (LIPITOR) 40 MG tablet Take 1 tablet (40 mg total) by mouth daily at 6 PM. 60 tablet 1  . Cholecalciferol (VITAMIN D3) 25 MCG (1000 UT) CAPS Take 1 capsule (1,000 Units total) by mouth daily. 30 capsule   . Cyanocobalamin (B-12) 1000 MCG SUBL Place 1 tablet under the tongue daily.    . ferrous sulfate 324 (65 Fe) MG TBEC Take 1 tablet (325 mg total) by mouth every other day.    . lidocaine-prilocaine (EMLA) cream Apply 1 application topically as needed (to port a cath 1 hour prior to each chemotherapy). 30 g 3  . metoprolol tartrate (LOPRESSOR) 25 MG tablet Take 1 tablet (25 mg total) by mouth 2 (two) times daily. 60 tablet 1  . pantoprazole (PROTONIX) 40 MG tablet TAKE 1 TABLET BY MOUTH TWICE DAILY BEFORE A MEAL 180 tablet 0  . tadalafil (CIALIS) 20 MG tablet TAKE ONE TABLET BY MOUTH DAILY AS NEEDED FOR ERECTILE DYSFUNCTION 20 tablet 3  . DULoxetine (CYMBALTA) 30 MG capsule Take 1 capsule (30 mg total) by mouth daily. (Patient not taking: Reported on 06/12/2021) 30 capsule 3  . ondansetron (ZOFRAN) 8 MG tablet Take 1 tablet (8 mg total) by mouth every 8 (eight) hours as needed for nausea or vomiting. (Patient not taking: Reported on 05/01/2021) 20 tablet 3  . prochlorperazine (COMPAZINE) 10 MG tablet Take 1  tablet (10 mg total) by mouth every 6 (six) hours as needed for nausea or vomiting. (Patient not taking: Reported on 05/01/2021) 30 tablet 3   No current facility-administered medications for this visit.   Facility-Administered Medications Ordered in Other Visits  Medication Dose Route Frequency Provider Last Rate Last Admin  . bevacizumab-awwb (MVASI) 800 mg in sodium chloride 0.9 % 100 mL chemo infusion  10 mg/kg (Treatment Plan Recorded) Intravenous Q14 Days Charlaine Dalton R, MD      . dexamethasone (DECADRON) 10 mg in sodium chloride 0.9 % 50 mL IVPB  10 mg Intravenous Once Charlaine Dalton  R, MD 204 mL/hr at 06/12/21 1026 10 mg at 06/12/21 1026  . fluorouracil (ADRUCIL) 4,650 mg in sodium chloride 0.9 % 57 mL chemo infusion  2,400 mg/m2 (Treatment Plan Recorded) Intravenous 1 day or 1 dose Charlaine Dalton R, MD      . heparin lock flush 100 unit/mL  500 Units Intracatheter Once PRN Charlaine Dalton R, MD      . heparin lock flush 100 unit/mL  500 Units Intravenous Once Charlaine Dalton R, MD      . sodium chloride flush (NS) 0.9 % injection 10 mL  10 mL Intravenous PRN Cammie Sickle, MD   10 mL at 10/09/20 0855  . sodium chloride flush (NS) 0.9 % injection 10 mL  10 mL Intracatheter PRN Cammie Sickle, MD       .  PHYSICAL EXAMINATION: ECOG PERFORMANCE STATUS: 0 - Asymptomatic  Vitals:   06/12/21 0900  BP: (!) 162/92  Pulse: 60  Resp: 16  Temp: (!) 97.1 F (36.2 C)     Filed Weights   06/12/21 0900  Weight: 169 lb 12.8 oz (77 kg)    Physical Exam Vitals and nursing note reviewed.  HENT:     Head: Normocephalic and atraumatic.     Mouth/Throat:     Pharynx: Oropharynx is clear.  Eyes:     Extraocular Movements: Extraocular movements intact.     Pupils: Pupils are equal, round, and reactive to light.  Cardiovascular:     Rate and Rhythm: Normal rate and regular rhythm.  Pulmonary:     Comments: Decreased breath sounds bilaterally.   Abdominal:     Palpations: Abdomen is soft.  Musculoskeletal:        General: Normal range of motion.     Cervical back: Normal range of motion.  Skin:    General: Skin is warm.     Findings: Bruising present.  Neurological:     General: No focal deficit present.     Mental Status: He is alert and oriented to person, place, and time.  Psychiatric:        Behavior: Behavior normal.        Judgment: Judgment normal.   LABORATORY DATA:  I have reviewed the data as listed Lab Results  Component Value Date   WBC 6.8 06/12/2021   HGB 15.0 06/12/2021   HCT 46.1 06/12/2021   MCV 102.9 (H) 06/12/2021   PLT 99 (L) 06/12/2021   Recent Labs    05/01/21 0757 05/15/21 0842 06/12/21 0851  NA 138 134* 136  K 3.9 4.2 4.2  CL 108 106 107  CO2 _0 GLUCOSE 128* 99 136*  BUN _1 CREATININE 1.78* 1.55* 1.60*  CALCIUM 8.7* 8.5* 8.3*  GFRNONAA 39* 46* 44*  PROT 6.5 6.6 6.6  ALBUMIN 3.3* 3.4* 3.4*  AST 32 29 38  ALT 24 24 32  ALKPHOS 206* 230* 248*  BILITOT 0.6 0.9 0.5  No results found for: CEA   RADIOGRAPHIC STUDIES: I have personally reviewed the radiological images as listed and agreed with the findings in the report. CT CHEST ABDOMEN PELVIS W CONTRAST  Result Date: 06/08/2021 CLINICAL DATA:  Colon cancer.  Restaging. EXAM: CT CHEST, ABDOMEN, AND PELVIS WITH CONTRAST TECHNIQUE: Multidetector CT imaging of the chest, abdomen and pelvis was performed following the standard protocol during bolus administration of intravenous contrast. RADIATION DOSE REDUCTION: This exam was performed according to the departmental dose-optimization program which includes automated exposure control, adjustment  of the mA and/or kV according to patient size and/or use of iterative reconstruction technique. CONTRAST:  65m OMNIPAQUE IOHEXOL 300 MG/ML  SOLN COMPARISON:  Abdomen pelvis CT 03/15/2021. Chest abdomen pelvis CT 12/27/2020. FINDINGS: CT CHEST FINDINGS Cardiovascular: The heart size is  normal. No substantial pericardial effusion. Coronary artery calcification is evident. Mild atherosclerotic calcification is noted in the wall of the thoracic aorta. Right Port-A-Cath tip is positioned in the distal SVC. Mediastinum/Nodes: No mediastinal lymphadenopathy. There is no hilar lymphadenopathy. Small hiatal hernia. The esophagus has normal imaging features. There is no axillary lymphadenopathy. Lungs/Pleura: Stable biapical pleuroparenchymal scarring. Centrilobular emphsyema noted. Calcified granuloma right middle lobe is unchanged. (99/3). 4 mm anterior right middle lobe nodule on 98/3 is unchanged. 5 mm left lower lobe nodule on 129/3, 4 mm medial left lower lobe nodule on 126/3 and 3 mm paraspinal left lower lobe nodule on 127/3 are all new since previous chest CT of 12/27/2020 and abdomen CT of 03/15/2021 which included this portion of the lung bases. 3 mm posterior right lower lobe nodule on 130/3 is also new since 03/15/2021. Several additional scattered tiny basilar nodules are evident. No pleural effusion. Musculoskeletal: No worrisome lytic or sclerotic osseous abnormality. CT ABDOMEN PELVIS FINDINGS Hepatobiliary: No suspicious focal abnormality within the liver parenchyma. Tiny calcified gallstone evident. No intrahepatic or extrahepatic biliary dilation. Pancreas: No focal mass lesion. No dilatation of the main duct. No intraparenchymal cyst. No peripancreatic edema. Spleen: No splenomegaly. No focal mass lesion. Adrenals/Urinary Tract: No adrenal nodule or mass. Stable tiny cyst lower pole right kidney left kidney unremarkable. No evidence for hydroureter. The urinary bladder appears normal for the degree of distention. Stomach/Bowel: Small hiatal hernia. Stomach otherwise unremarkable. Duodenum is normally positioned as is the ligament of Treitz. No small bowel wall thickening. No small bowel dilatation. The terminal ileum is normal. Irregular wall thickening noted in the ascending colon at  and just distal to the level of the ileocecal valve. There is soft tissue stranding in the pericolonic fat of this region, similar to prior. Circumferential wall thickening in the rectum is similar to prior. Vascular/Lymphatic: There is mild atherosclerotic calcification of the abdominal aorta without aneurysm. No gastrohepatic or hepatoduodenal ligament lymphadenopathy. No retroperitoneal lymphadenopathy. Para-aortic lymphadenopathy seen on PET-CT 09/21/20 is no longer evident. No ileocolic or mesenteric lymphadenopathy on the current study. Index omental nodule measured previously at 11 x 6 mm is 10 x 6 mm today on image 73/2. Reproductive: The prostate gland and seminal vesicles are unremarkable. Other: No intraperitoneal free fluid. Musculoskeletal: No worrisome lytic or sclerotic osseous abnormality. IMPRESSION: 1. Interval development of multiple basilar predominant tiny bilateral pulmonary nodules, all new since previous chest CT of 12/27/2020 and abdomen CT of 03/15/2021. Close attention on follow-up recommended as appearance raises concern for metastatic disease. 2. Wall thickening in the ascending colon, at and just distal to the level of the ileocecal valve with soft tissue stranding in the pericolonic fat of this region, similar to prior. 3. Circumferential wall thickening again noted in the rectum. 4. Para-aortic lymphadenopathy seen on PET-CT 09/21/20 is no longer evident. Index omental nodule measured previously on the 03/15/2021 exam at 11 x 6 mm is 10 x 6 mm today. 5. Cholelithiasis. 6. Small hiatal hernia. 7. Aortic Atherosclerosis (ICD10-I70.0) and Emphysema (ICD10-J43.9). Electronically Signed   By: EMisty StanleyM.D.   On: 06/08/2021 17:43    ASSESSMENT & PLAN:   [place]  # STAGE IV- Synchronous primaries a] rectal cancer-10 cm-adenocarcinoma &  b] transverse colon-- intramucosal adenoca]; abdominal lymphadenopathy; liver metastases; omental metastases. -Currently on 5-FU plus Bev-  CT MAY 26th, 2023-  Interval development of multiple basilar predominant tiny bilateral pulmonary nodules, all new since previous chest CT of 12/27/2020 and abdomen CT of 03/15/2021- concerning for metastatic disease.  Wall thickening in the ascending colon, at and just distal to the level of the ileocecal valve with soft tissue stranding in the pericolonic fat of this region, similar to prior. Circumferential wall thickening again noted in the rectum.; Omental Nodule-STABLE [10x91m].  Given the subcentimeter nature of the nodules recommend monitoring for now.  Continue chemotherapy as planned.    # proceed with 5FU+Avastin maintenance continue BEV [see below]  Labs today reviewed;  acceptable for treatment today. Will repeat scans in 2-3 months.   # Anemia/iron deficiency hemoglobin ~13-  secondary to chronic GI bleeding/tumor-STABLE  # Thrombocytopenia/secondary chemotherapy/on Plavix-aspirin-platelets- 96s-monitor closely.  # HTN- 180/102; at home BP reviewed- 130-150/ DBP80-90s.continue checking BP at home/reviewed the log from home.  Proceed with avastin today. STABLE.   # PN G-2-3 sec to Oxaliplatin [last 04/02/2021]. discontinued oxaliplatin for now. START cymbalta 30 mg q day.   # Chronic kidney disease stage III-[GFR-46 ] -stable.  Continue keeping up with hydration.   #Incidental findings on Imaging CT may 26th 2023: Emphysema; atherosclerosis; cholelithiasis I reviewed/discussed/counseled the patient.   * medical records- o   # DISPOSITION:   # 5FU+Bev today;  # Follow up in 2 weeks- MD: labs- cbc/cmp;UA CEA;  5FU+ bev; Pump off on D-3; Dr.B; # I reviewed the blood work- with the patient in detail; also reviewed the imaging independently [as summarized above]; and with the patient in detail.     Rectal cancer (Lourdes Sledge MD 06/12/2021   CC: Dr. BRogue Bussing

## 2021-06-12 NOTE — Assessment & Plan Note (Addendum)
#   STAGE IV- Synchronous primaries a] rectal cancer-10 cm-adenocarcinoma & b] transverse colon-- intramucosal adenoca]; abdominal lymphadenopathy; liver metastases; omental metastases. -Currently on 5-FU plus Bev- CT MAY 26th, 2023-  Interval development of multiple basilar predominant tiny bilateral pulmonary nodules, all new since previous chest CT of 12/27/2020 and abdomen CT of 03/15/2021- concerning for metastatic disease.  Wall thickening in the ascending colon, at and just distal to the level of the ileocecal valve with soft tissue stranding in the pericolonic fat of this region, similar to prior. Circumferential wall thickening again noted in the rectum.; Omental Nodule-STABLE [10x24m].  Given the subcentimeter nature of the nodules recommend monitoring for now.  Continue chemotherapy as planned.  # proceed with 5FU+Avastin maintenance continue BEV [see below]  Labs today reviewed;  acceptable for treatment today. Will repeat scans in 2-3 months.   # Anemia/iron deficiency hemoglobin ~13-  secondary to chronic GI bleeding/tumor-STABLE  # Thrombocytopenia/secondary chemotherapy/on Plavix-aspirin-platelets- 96s-monitor closely.  # HTN- 180/102; at home BP reviewed- 130-150/ DBP80-90s.continue checking BP at home/reviewed the log from home.  Proceed with avastin today. STABLE.   # PN G-2-3 sec to Oxaliplatin [last 04/02/2021]. discontinued oxaliplatin for now. START cymbalta 30 mg q day.   # Chronic kidney disease stage III-[GFR-46 ] -stable.  Continue keeping up with hydration.   #Incidental findings on Imaging CT may 26th 2023: Emphysema; atherosclerosis; cholelithiasis I reviewed/discussed/counseled the patient.   * medical records- o  # DISPOSITION:   # 5FU+Bev today;  # Follow up in 2 weeks- MD: labs- cbc/cmp;UA CEA;  5FU+ bev; Pump off on D-3; Dr.B; # I reviewed the blood work- with the patient in detail; also reviewed the imaging independently [as summarized above]; and with the  patient in detail.

## 2021-06-13 ENCOUNTER — Inpatient Hospital Stay: Payer: Medicare HMO

## 2021-06-13 ENCOUNTER — Telehealth: Payer: Self-pay

## 2021-06-13 LAB — CEA: CEA: 3.2 ng/mL (ref 0.0–4.7)

## 2021-06-13 MED ORDER — PANTOPRAZOLE SODIUM 40 MG PO TBEC: 40.0000 mg | DELAYED_RELEASE_TABLET | Freq: Two times a day (BID) | ORAL | 0 refills | Status: DC

## 2021-06-13 NOTE — Telephone Encounter (Signed)
Pt lmovm requesting for a call back, nothing further mentioned

## 2021-06-13 NOTE — Telephone Encounter (Signed)
Called but unable to leave a message because voicemail is not set up. Sent mychart message

## 2021-06-13 NOTE — Telephone Encounter (Signed)
Patient called and states he needs pantoprazole refilled informed patient that he needed a appointment schedule. Made appointment for 06/25/21 and sent a 30 day supply to the pharmacy.

## 2021-06-13 NOTE — Addendum Note (Signed)
Addended by: Ulyess Blossom L on: 06/13/2021 08:55 AM   Modules accepted: Orders

## 2021-06-14 ENCOUNTER — Inpatient Hospital Stay: Payer: Medicare HMO | Attending: Internal Medicine

## 2021-06-14 VITALS — BP 135/79 | HR 86 | Temp 96.7°F | Resp 18

## 2021-06-14 DIAGNOSIS — C786 Secondary malignant neoplasm of retroperitoneum and peritoneum: Secondary | ICD-10-CM | POA: Insufficient documentation

## 2021-06-14 DIAGNOSIS — C787 Secondary malignant neoplasm of liver and intrahepatic bile duct: Secondary | ICD-10-CM | POA: Insufficient documentation

## 2021-06-14 DIAGNOSIS — I129 Hypertensive chronic kidney disease with stage 1 through stage 4 chronic kidney disease, or unspecified chronic kidney disease: Secondary | ICD-10-CM | POA: Diagnosis not present

## 2021-06-14 DIAGNOSIS — N183 Chronic kidney disease, stage 3 unspecified: Secondary | ICD-10-CM | POA: Diagnosis not present

## 2021-06-14 DIAGNOSIS — D696 Thrombocytopenia, unspecified: Secondary | ICD-10-CM | POA: Insufficient documentation

## 2021-06-14 DIAGNOSIS — D509 Iron deficiency anemia, unspecified: Secondary | ICD-10-CM | POA: Diagnosis not present

## 2021-06-14 DIAGNOSIS — C20 Malignant neoplasm of rectum: Secondary | ICD-10-CM | POA: Diagnosis not present

## 2021-06-14 DIAGNOSIS — C184 Malignant neoplasm of transverse colon: Secondary | ICD-10-CM | POA: Insufficient documentation

## 2021-06-14 DIAGNOSIS — Z5111 Encounter for antineoplastic chemotherapy: Secondary | ICD-10-CM | POA: Diagnosis not present

## 2021-06-14 DIAGNOSIS — Z79899 Other long term (current) drug therapy: Secondary | ICD-10-CM | POA: Diagnosis not present

## 2021-06-14 MED ORDER — HEPARIN SOD (PORK) LOCK FLUSH 100 UNIT/ML IV SOLN
500.0000 [IU] | Freq: Once | INTRAVENOUS | Status: AC | PRN
  Administered 2021-06-14: 500 [IU]
  Filled 2021-06-14: qty 5

## 2021-06-14 MED ORDER — SODIUM CHLORIDE 0.9% FLUSH
10.0000 mL | INTRAVENOUS | Status: DC | PRN
  Administered 2021-06-14: 10 mL
  Filled 2021-06-14: qty 10

## 2021-06-15 DIAGNOSIS — Z0289 Encounter for other administrative examinations: Secondary | ICD-10-CM | POA: Insufficient documentation

## 2021-06-15 DIAGNOSIS — I252 Old myocardial infarction: Secondary | ICD-10-CM | POA: Insufficient documentation

## 2021-06-15 DIAGNOSIS — Z710 Person encountering health services to consult on behalf of another person: Secondary | ICD-10-CM | POA: Insufficient documentation

## 2021-06-15 DIAGNOSIS — I10 Essential (primary) hypertension: Secondary | ICD-10-CM | POA: Insufficient documentation

## 2021-06-15 DIAGNOSIS — Z9221 Personal history of antineoplastic chemotherapy: Secondary | ICD-10-CM | POA: Insufficient documentation

## 2021-06-15 DIAGNOSIS — E611 Iron deficiency: Secondary | ICD-10-CM | POA: Insufficient documentation

## 2021-06-15 DIAGNOSIS — C189 Malignant neoplasm of colon, unspecified: Secondary | ICD-10-CM | POA: Insufficient documentation

## 2021-06-15 DIAGNOSIS — I2510 Atherosclerotic heart disease of native coronary artery without angina pectoris: Secondary | ICD-10-CM | POA: Insufficient documentation

## 2021-06-18 ENCOUNTER — Ambulatory Visit (INDEPENDENT_AMBULATORY_CARE_PROVIDER_SITE_OTHER): Payer: Medicare HMO | Admitting: *Deleted

## 2021-06-20 ENCOUNTER — Encounter: Payer: Self-pay | Admitting: Internal Medicine

## 2021-06-22 NOTE — Telephone Encounter (Signed)
Tried to call pt, no answer/vm.

## 2021-06-25 ENCOUNTER — Ambulatory Visit: Payer: Medicare HMO | Admitting: Gastroenterology

## 2021-06-25 ENCOUNTER — Encounter: Payer: Self-pay | Admitting: Gastroenterology

## 2021-06-25 VITALS — BP 146/87 | HR 67 | Temp 98.0°F | Ht 68.0 in | Wt 168.6 lb

## 2021-06-25 DIAGNOSIS — K2210 Ulcer of esophagus without bleeding: Secondary | ICD-10-CM | POA: Diagnosis not present

## 2021-06-25 DIAGNOSIS — C20 Malignant neoplasm of rectum: Secondary | ICD-10-CM | POA: Diagnosis not present

## 2021-06-25 NOTE — Progress Notes (Signed)
[name], MD [address]  [place]  [place], [place] [address]  Main: [phone]  Fax: [phone]    Gastroenterology Consultation  Referring Provider:     [name], MD Primary Care Physician:  [name], MD Primary Gastroenterologist:  Dr. [name] Reason for Consultation: Erosive esophagitis        HPI:   Albert Giles is a 76 y.o. male referred by Dr. [name], MD  for consultation & management of erosive esophagitis.  Patient underwent upper endoscopy in 08/2020, found to have severe erosive esophagitis and large hiatal hernia.  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  Patient reports that his bowel movements have been variable without any visible blood.  He denies any abdominal pain, bloating, nausea or vomiting.  His weight has been stable.  With regards to erosive esophagitis, patient takes Protonix 40 mg twice daily.  No evidence of anemia, MCV elevated secondary to B12 deficiency.  Patient does not have any other GI concerns today  NSAIDs: None  Antiplts/Anticoagulants/Anti thrombotics: None  GI Procedures:  EGD and colonoscopy 09/06/2020 - Normal duodenal bulb and second portion of the duodenum. Biopsied. - Normal stomach. Biopsied. - 4 cm hiatal hernia. - LA Grade C reflux esophagitis with no bleeding.  - Malignant partially obstructing tumor in the transverse colon and at 70 cm proximal to the anus. Biopsied. Tattooed. - One 20 mm polyp in the transverse colon, removed with mucosal resection. Resected and retrieved. Clips were placed. Tattooed. - Rule out malignancy, tumor in the mid rectum and at 10 cm proximal to the anus. Biopsied. Tattooed. - The distal rectum and anal verge are normal on retroflexion view.  DIAGNOSIS:  A. DUODENUM; COLD BIOPSY:  - DUODENAL MUCOSA WITH NO SIGNIFICANT PATHOLOGIC  ALTERATION.  - NEGATIVE FOR FEATURES OF CELIAC DISEASE.  - NEGATIVE FOR DYSPLASIA AND MALIGNANCY.   B. 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  - NEGATIVE FOR DYSPLASIA AND MALIGNANCY.   C. COLON MASS, 70 CM; COLD BIOPSY:  - INTRAMUCOSAL ADENOCARCINOMA AT LEAST; SEE COMMENT.   Comment:  There is no definitive evidence of invasion in this sample.  The  findings may not accurately represent the entire underlying lesion;  clinical correlation is recommended.   D. COLON POLYP, TRANSVERSE; HOT SNARE:  - TUBULOVILLOUS ADENOMA.  - NEGATIVE FOR HIGH GRADE DYSPLASIA AND MALIGNANCY.   E. RECTUM MASS; COLD BIOPSY:  - INVASIVE ADENOCARCINOMA, MODERATELY TO POORLY DIFFERENTIATED.   Past Medical History:  Diagnosis Date   CAD (coronary artery disease) 06/2014   UA with NSTEMI - cath with 99% prox L circ s/p stent, EF 40% ([place], [place] [facility])   Emphysema    mild   Ex-smoker    Family history of breast cancer    Family history of colon cancer    Family history of pancreatic cancer    NSTEMI (non-ST elevated myocardial infarction) ([facility]) 07/13/2014    Past Surgical History:  Procedure Laterality Date   CARDIAC CATHETERIZATION N/A 07/14/2014   Left Heart Cath and Coronary Angiography with stent placement;  Surgeon: Teodoro Spray, MD   CARDIAC CATHETERIZATION N/A 07/14/2014   Coronary Stent Intervention;  Surgeon: Yolonda Kida, MD   COLONOSCOPY WITH PROPOFOL N/A 09/06/2020   Procedure: COLONOSCOPY WITH PROPOFOL;  Surgeon: Lin Landsman, MD;  Location: [facility];  Service: Gastroenterology;  Laterality: N/A;   CYSTECTOMY     on back   ESOPHAGOGASTRODUODENOSCOPY N/A 09/06/2020   Procedure: ESOPHAGOGASTRODUODENOSCOPY (EGD);  Surgeon: Lin Landsman, MD;  Location: [facility] ENDOSCOPY;  Service: Gastroenterology;  Laterality: N/A;   INGUINAL HERNIA REPAIR Right 08/17/04   IR IMAGING GUIDED PORT INSERTION   09/13/2020   Current Outpatient Medications:    aspirin EC 81 MG tablet, Take 1 tablet (81 mg total) by mouth daily., Disp: 60 tablet, Rfl: 1   atorvastatin (LIPITOR) 40 MG tablet, Take 1 tablet (40 mg total) by mouth daily at 6 PM., Disp: 60 tablet, Rfl: 1   Cholecalciferol (VITAMIN D3) 25 MCG (1000 UT) CAPS, Take 1 capsule (1,000 Units total) by mouth daily., Disp: 30 capsule, Rfl:    Cyanocobalamin (B-12) 1000 MCG SUBL, Place 1 tablet under the tongue daily., Disp: , Rfl:    DULoxetine (CYMBALTA) 30 MG capsule, Take 1 capsule (30 mg total) by mouth daily., Disp: 30 capsule, Rfl: 3   lidocaine-prilocaine (EMLA) cream, Apply 1 application topically as needed (to port a cath 1 hour prior to each chemotherapy)., Disp: 30 g, Rfl: 3   metoprolol tartrate (LOPRESSOR) 25 MG tablet, Take 1 tablet (25 mg total) by mouth 2 (two) times daily., Disp: 60 tablet, Rfl: 1   ondansetron (ZOFRAN) 8 MG tablet, Take 1 tablet (8 mg total) by mouth every 8 (eight) hours as needed for nausea or vomiting., Disp: 20 tablet, Rfl: 3   pantoprazole (PROTONIX) 40 MG tablet, Take 1 tablet (40 mg total) by mouth 2 (two) times daily before a meal., Disp: 60 tablet, Rfl: 0   prochlorperazine (COMPAZINE) 10 MG tablet, Take 1 tablet (10 mg total) by mouth every 6 (six) hours as needed for nausea or vomiting., Disp: 30 tablet, Rfl: 3   tadalafil (CIALIS) 20 MG tablet, TAKE ONE TABLET BY MOUTH DAILY AS NEEDED FOR ERECTILE DYSFUNCTION, Disp: 20 tablet, Rfl: 3   ferrous sulfate 324 (65 Fe) MG TBEC, Take 1 tablet (325 mg total) by mouth every other day., Disp: , Rfl:  No current facility-administered medications for this visit.  Facility-Administered Medications Ordered in Other Visits:    heparin lock flush 100 unit/mL, 500 Units, Intracatheter, Once PRN, Charlaine Dalton R, MD   sodium chloride flush (NS) 0.9 % injection 10 mL, 10 mL, Intravenous, PRN, Charlaine Dalton R, MD, 10 mL at 10/09/20 0855   sodium chloride flush (NS)  0.9 % injection 10 mL, 10 mL, Intracatheter, PRN, Cammie Sickle, MD    Family History  Problem Relation Age of Onset   Cancer Mother 32       colon   Cancer Sister        breast   Crohn's disease Sister    Cancer Daughter        anal   Crohn's disease Niece    CAD Neg Hx    Stroke Neg Hx    Diabetes Neg Hx    Prostate cancer Neg Hx    Kidney cancer Neg Hx    Bladder Cancer Neg Hx      Social History   Tobacco Use   Smoking status: Former    Packs/day: 1.00    Years: 35.00    Total pack years: 35.00    Types: Cigarettes    Quit date: 07/07/1998    Years since quitting: 22.9   Smokeless tobacco: Never  Vaping Use   Vaping Use: Never used  Substance Use Topics   Alcohol  use: No   Drug use: No    Allergies as of 06/25/2021   (No Known Allergies)    Review of Systems:    All systems reviewed and negative except where noted in HPI.   Physical Exam:  BP (!) 146/87 (BP Location: Right Arm, Patient Position: Sitting, Cuff Size: Normal)   Pulse 67   Temp 98 F (36.7 C) (Oral)   Ht '5\' 8"'$  (1.727 m)   Wt 168 lb 9.6 oz (76.5 kg)   BMI 25.64 kg/m  No LMP for male patient.  General:   Alert,  Well-developed, well-nourished, pleasant and cooperative in NAD Head:  Normocephalic and atraumatic. Eyes:  Sclera clear, no icterus.   Conjunctiva pink. Ears:  Normal auditory acuity. Nose:  No deformity, discharge, or lesions. Mouth:  No deformity or lesions,oropharynx pink & moist. Neck:  Supple; no masses or thyromegaly. Lungs:  Respirations even and unlabored.  Clear throughout to auscultation.   No wheezes, crackles, or rhonchi. No acute distress. Heart:  Regular rate and rhythm; no murmurs, clicks, rubs, or gallops. Abdomen:  Normal bowel sounds. Soft, non-tender and non-distended without masses, hepatosplenomegaly or hernias noted.  No guarding or rebound tenderness.   Rectal: Not performed Msk:  Symmetrical without gross deformities. Good, equal movement &  strength bilaterally. Pulses:  Normal pulses noted. Extremities:  No clubbing or edema.  No cyanosis. Neurologic:  Alert and oriented x3;  grossly normal neurologically. Skin:  Intact without significant lesions or rashes. No jaundice. Psych:  Alert and cooperative. Normal mood and affect.  Imaging Studies: Reviewed  Assessment and Plan:   BROOKS KINNAN is a 76 y.o. male with history of erosive esophagitis, stage IV synchronous rectal and transverse colon adenocarcinoma on chemotherapy  Erosive esophagitis and 4 cm hiatal hernia Recommend repeat EGD to confirm healing and rule out any underlying Barrett's esophagus Continue Protonix 40 mg p.o. twice daily Continue antireflux lifestyle  Follow up as needed   [name], MD

## 2021-06-26 ENCOUNTER — Encounter: Payer: Self-pay | Admitting: Internal Medicine

## 2021-06-26 ENCOUNTER — Inpatient Hospital Stay: Payer: Medicare HMO

## 2021-06-26 ENCOUNTER — Inpatient Hospital Stay (HOSPITAL_BASED_OUTPATIENT_CLINIC_OR_DEPARTMENT_OTHER): Payer: Medicare HMO | Admitting: Internal Medicine

## 2021-06-26 DIAGNOSIS — Z5111 Encounter for antineoplastic chemotherapy: Secondary | ICD-10-CM | POA: Diagnosis not present

## 2021-06-26 DIAGNOSIS — N183 Chronic kidney disease, stage 3 unspecified: Secondary | ICD-10-CM | POA: Diagnosis not present

## 2021-06-26 DIAGNOSIS — C20 Malignant neoplasm of rectum: Secondary | ICD-10-CM

## 2021-06-26 DIAGNOSIS — C787 Secondary malignant neoplasm of liver and intrahepatic bile duct: Secondary | ICD-10-CM | POA: Diagnosis not present

## 2021-06-26 DIAGNOSIS — D696 Thrombocytopenia, unspecified: Secondary | ICD-10-CM | POA: Diagnosis not present

## 2021-06-26 DIAGNOSIS — C786 Secondary malignant neoplasm of retroperitoneum and peritoneum: Secondary | ICD-10-CM | POA: Diagnosis not present

## 2021-06-26 DIAGNOSIS — D509 Iron deficiency anemia, unspecified: Secondary | ICD-10-CM | POA: Diagnosis not present

## 2021-06-26 DIAGNOSIS — Z79899 Other long term (current) drug therapy: Secondary | ICD-10-CM | POA: Diagnosis not present

## 2021-06-26 DIAGNOSIS — I129 Hypertensive chronic kidney disease with stage 1 through stage 4 chronic kidney disease, or unspecified chronic kidney disease: Secondary | ICD-10-CM | POA: Diagnosis not present

## 2021-06-26 DIAGNOSIS — C184 Malignant neoplasm of transverse colon: Secondary | ICD-10-CM | POA: Diagnosis not present

## 2021-06-26 LAB — CBC WITH DIFFERENTIAL/PLATELET
Abs Immature Granulocytes: 0.02 10*3/uL (ref 0.00–0.07)
Basophils Absolute: 0.1 10*3/uL (ref 0.0–0.1)
Basophils Relative: 1 %
Eosinophils Absolute: 0.7 10*3/uL — ABNORMAL HIGH (ref 0.0–0.5)
Eosinophils Relative: 8 %
HCT: 44.3 % (ref 39.0–52.0)
Hemoglobin: 14.2 g/dL (ref 13.0–17.0)
Immature Granulocytes: 0 %
Lymphocytes Relative: 20 %
Lymphs Abs: 1.7 10*3/uL (ref 0.7–4.0)
MCH: 32.9 pg (ref 26.0–34.0)
MCHC: 32.1 g/dL (ref 30.0–36.0)
MCV: 102.5 fL — ABNORMAL HIGH (ref 80.0–100.0)
Monocytes Absolute: 0.9 10*3/uL (ref 0.1–1.0)
Monocytes Relative: 11 %
Neutro Abs: 5.1 10*3/uL (ref 1.7–7.7)
Neutrophils Relative %: 60 %
Platelets: 100 10*3/uL — ABNORMAL LOW (ref 150–400)
RBC: 4.32 MIL/uL (ref 4.22–5.81)
RDW: 14.9 % (ref 11.5–15.5)
WBC: 8.6 10*3/uL (ref 4.0–10.5)
nRBC: 0 % (ref 0.0–0.2)

## 2021-06-26 LAB — COMPREHENSIVE METABOLIC PANEL
ALT: 30 U/L (ref 0–44)
AST: 33 U/L (ref 15–41)
Albumin: 3.3 g/dL — ABNORMAL LOW (ref 3.5–5.0)
Alkaline Phosphatase: 252 U/L — ABNORMAL HIGH (ref 38–126)
Anion gap: 5 (ref 5–15)
BUN: 21 mg/dL (ref 8–23)
CO2: 24 mmol/L (ref 22–32)
Calcium: 8.6 mg/dL — ABNORMAL LOW (ref 8.9–10.3)
Chloride: 107 mmol/L (ref 98–111)
Creatinine, Ser: 1.71 mg/dL — ABNORMAL HIGH (ref 0.61–1.24)
GFR, Estimated: 41 mL/min — ABNORMAL LOW (ref >60)
Glucose, Bld: 99 mg/dL (ref 70–99)
Potassium: 4.2 mmol/L (ref 3.5–5.1)
Sodium: 136 mmol/L (ref 135–145)
Total Bilirubin: 0.8 mg/dL (ref 0.3–1.2)
Total Protein: 6.4 g/dL — ABNORMAL LOW (ref 6.5–8.1)

## 2021-06-26 LAB — URINALYSIS, COMPLETE (UACMP) WITH MICROSCOPIC
Bacteria, UA: NONE SEEN
Bilirubin Urine: NEGATIVE
Glucose, UA: NEGATIVE mg/dL
Hgb urine dipstick: NEGATIVE
Ketones, ur: NEGATIVE mg/dL
Leukocytes,Ua: NEGATIVE
Nitrite: NEGATIVE
Protein, ur: NEGATIVE mg/dL
Specific Gravity, Urine: 1.012 (ref 1.005–1.030)
Squamous Epithelial / HPF: NONE SEEN (ref 0–5)
pH: 5 (ref 5.0–8.0)

## 2021-06-26 MED ORDER — HEPARIN SOD (PORK) LOCK FLUSH 100 UNIT/ML IV SOLN
500.0000 [IU] | Freq: Once | INTRAVENOUS | Status: DC | PRN
  Filled 2021-06-26: qty 5

## 2021-06-26 MED ORDER — SODIUM CHLORIDE 0.9 % IV SOLN
10.0000 mg/kg | INTRAVENOUS | Status: DC
  Administered 2021-06-26: 800 mg via INTRAVENOUS
  Filled 2021-06-26: qty 32

## 2021-06-26 MED ORDER — SODIUM CHLORIDE 0.9 % IV SOLN
2400.0000 mg/m2 | INTRAVENOUS | Status: DC
  Administered 2021-06-26: 4650 mg via INTRAVENOUS
  Filled 2021-06-26: qty 93

## 2021-06-26 MED ORDER — SODIUM CHLORIDE 0.9 % IV SOLN
10.0000 mg | Freq: Once | INTRAVENOUS | Status: AC
  Administered 2021-06-26: 10 mg via INTRAVENOUS
  Filled 2021-06-26: qty 10

## 2021-06-26 MED ORDER — SODIUM CHLORIDE 0.9 % IV SOLN
INTRAVENOUS | Status: DC
  Filled 2021-06-26: qty 250

## 2021-06-26 NOTE — Progress Notes (Signed)
[place] NOTE  Patient Care Team: [name], MD as PCP - General (Family Medicine) Clent Jacks, RN as Oncology Nurse Navigator Cammie Sickle, MD as Consulting Physician (Oncology)  CHIEF COMPLAINTS/PURPOSE OF CONSULTATION: colon/rectal cancer  #  Oncology History Overview Note  # Malignant partially obstructing tumor in the transverse colon/70 cm proximal to the anus- C. COLON MASS, 70 CM; COLD BIOPSY:  - INTRAMUCOSAL ADENOCARCINOMA AT LEAST;  # One 20 mm polyp in the transverse colon, removed with mucosal resection. Resected and retrieved. Clips were placed. Tattooed.  # tumor in the mid rectum and at 10 cm proximal to the anus. Biopsied.      SEE COMMENT.   Comment:  There is no definitive evidence of invasion in this sample.  The  findings may not accurately represent the entire underlying lesion;  clinical correlation is recommended.   D. COLON POLYP, TRANSVERSE; HOT SNARE:  - TUBULOVILLOUS ADENOMA.  - NEGATIVE FOR HIGH GRADE DYSPLASIA AND MALIGNANCY.   E. RECTUM MASS; COLD BIOPSY:  - INVASIVE ADENOCARCINOMA, MODERATELY TO POORLY DIFFERENTIATED. ----------------------------------   # PET scan: SEP 2022-proximal right colon [adjacent mesenteric lymph nodes] and rectal hypermetabolism.  Additional subcentimeter bilateral hypermetabolic lymphadenopathy noted in the retroperitoneal/left external iliac; inferior right lobe of the liver concerning for metastatic disease; right lower quadrant soft tissue mass concerning for peritoneal carcinomatosis; bilateral solid pulmonary nodules ~5 mm; MRI rectum- T stage: T4a; N stage:  N1.   # 09/12-2020- FOLFOX chemo [Dr.White/Dr.Vanga]; ADDED BEV with cycle #3-DC 5-FU bolus+LV; # 6-oxaliplatin dose reduced by 20%[thrombocytopenia]; LAST OX- FEB 20th, 2023- starting cycle #13-will discontinue oxaliplatin [given PN-G-12;/thrombocytopenia]  # MAY  27 th, 2023- CT scan-subcentimeter multiple  lung nodules concerning for for progression of disease.    Rectal cancer ([place])  09/11/2020 Initial Diagnosis   Rectal cancer ([place])   09/25/2020 -  Chemotherapy   Patient is on Treatment Plan : COLORECTAL FOLFOX q14d x 4 months     09/28/2020 Cancer Staging   Staging form: Colon and Rectum, AJCC 8th Edition - Clinical: Stage IVC (cT4a, cN1, cM1c) - Signed by Cammie Sickle, MD on 09/28/2020    Genetic Testing   Negative genetic testing. No pathogenic variants identified on the [facility] Multi-Cancer+RNA Panel. The report date is 11/05/2020.  The Multi-Cancer Panel + RNA offered by [facility] includes sequencing and/or deletion duplication testing of the following 84 genes: AIP, ALK, APC, ATM, AXIN2,BAP1,  BARD1, BLM, BMPR1A, BRCA1, BRCA2, BRIP1, CASR, CDC73, CDH1, CDK4, CDKN1B, CDKN1C, CDKN2A (p14ARF), CDKN2A (p16INK4a), CEBPA, CHEK2, CTNNA1, DICER1, DIS3L2, EGFR (c.2369C>T, p.Thr790Met variant only), EPCAM (Deletion/duplication testing only), FH, FLCN, GATA2, GPC3, GREM1 (Promoter region deletion/duplication testing only), HOXB13 (c.251G>A, p.Gly84Glu), HRAS, KIT, MAX, MEN1, MET, MITF (c.952G>A, p.Glu318Lys variant only), MLH1, MSH2, MSH3, MSH6, MUTYH, NBN, NF1, NF2, NTHL1, PALB2, PDGFRA, PHOX2B, PMS2, POLD1, POLE, POT1, PRKAR1A, PTCH1, PTEN, RAD50, RAD51C, RAD51D, RB1, RECQL4, RET, RUNX1, SDHAF2, SDHA (sequence changes only), SDHB, SDHC, SDHD, SMAD4, SMARCA4, SMARCB1, SMARCE1, STK11, SUFU, TERC, TERT, TMEM127, TP53, TSC1, TSC2, VHL, WRN and WT1.     HISTORY OF PRESENTING ILLNESS: Ambulating independently.  Accompanied by daughter.  Albert Giles 76 y.o.  male synchronous colon cancer/rectal cancer-stage IV-on 5 CIV FU+Avastin is here for follow-up/review.   Complains of skin rash bilateral lower extremities. Patient continues to complain of tingling and numbness in his fingertips and toes.  No falls.  Progressively getting worse.  No headaches.  No nosebleeds. No diarrhea.  No constipation.  Complains of mild fatigue.   Review of Systems  Constitutional:  Negative for chills, diaphoresis, fever and weight loss.  HENT:  Negative for nosebleeds and sore throat.   Eyes:  Negative for double vision.  Respiratory:  Negative for cough, hemoptysis, sputum production, shortness of breath and wheezing.   Cardiovascular:  Negative for chest pain, palpitations, orthopnea and leg swelling.  Gastrointestinal:  Negative for abdominal pain, blood in stool, constipation, diarrhea, heartburn, melena, nausea and vomiting.  Genitourinary:  Negative for dysuria, frequency and urgency.  Skin: Negative.  Negative for itching and rash.  Neurological:  Positive for tingling and sensory change. Negative for dizziness, focal weakness, weakness and headaches.  Endo/Heme/Allergies:  Does not bruise/bleed easily.  Psychiatric/Behavioral:  Negative for depression. The patient is not nervous/anxious and does not have insomnia.      MEDICAL HISTORY:  Past Medical History:  Diagnosis Date   CAD (coronary artery disease) 06/2014   UA with NSTEMI - cath with 99% prox L circ s/p stent, EF 40% ([place], [place] at [facility])   Emphysema    mild   Ex-smoker    Family history of breast cancer    Family history of colon cancer    Family history of pancreatic cancer    NSTEMI (non-ST elevated myocardial infarction) ([place]) 07/13/2014    SURGICAL HISTORY: Past Surgical History:  Procedure Laterality Date   CARDIAC CATHETERIZATION N/A 07/14/2014   Left Heart Cath and Coronary Angiography with stent placement;  Surgeon: Teodoro Spray, MD   CARDIAC CATHETERIZATION N/A 07/14/2014   Coronary Stent Intervention;  Surgeon: Yolonda Kida, MD   COLONOSCOPY WITH PROPOFOL N/A 09/06/2020   Procedure: COLONOSCOPY WITH PROPOFOL;  Surgeon: Lin Landsman, MD;  Location: [facility] ENDOSCOPY;  Service: Gastroenterology;  Laterality: N/A;   CYSTECTOMY     on back   ESOPHAGOGASTRODUODENOSCOPY N/A 09/06/2020   Procedure:  ESOPHAGOGASTRODUODENOSCOPY (EGD);  Surgeon: Lin Landsman, MD;  Location[facility] ENDOSCOPY;  Service: Gastroenterology;  Laterality: N/A;   INGUINAL HERNIA REPAIR Right 08/17/04   IR IMAGING GUIDED PORT INSERTION  09/13/2020    SOCIAL HISTORY: Social History   Socioeconomic History   Marital status: Married    Spouse name: Not on file   Number of children: Not on file   Years of education: Not on file   Highest education level: Not on file  Occupational History   Not on file  Tobacco Use   Smoking status: Former    Packs/day: 1.00    Years: 35.00    Total pack years: 35.00    Types: Cigarettes    Quit date: 07/07/1998    Years since quitting: 22.9   Smokeless tobacco: Never  Vaping Use   Vaping Use: Never used  Substance and Sexual Activity   Alcohol use: No   Drug use: No   Sexual activity: Yes  Other Topics Concern   Not on file  Social History Narrative   Caffeine: 2 cups coffee, 2 cups tea/day   Lives with wife   Occupation: Higher education careers adviser, retired   Edu: HS   Activity: works in yard, walking   Diet: some water, fruits/vegetables daily   -------------------------------------------------------------------       [employer]; [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    Social Determinants of Health   Financial Resource Strain: Low Risk  (06/16/2020)   Overall Financial Resource Strain (CARDIA)    Difficulty of Paying Living Expenses:  Not hard at all  Food Insecurity: No Food Insecurity (06/16/2020)   Hunger Vital Sign    Worried About Running Out of Food in the Last Year: Never true    Ran Out of Food in the Last Year: Never true  Transportation Needs: No Transportation Needs (06/16/2020)   PRAPARE - Hydrologist (Medical): No    Lack of Transportation (Non-Medical): No  Physical Activity: Inactive (06/16/2020)   Exercise Vital Sign    Days of Exercise per Week: 0 days     Minutes of Exercise per Session: 0 min  Stress: No Stress Concern Present (06/16/2020)   [place]    Feeling of Stress : Not at all  Social Connections: Not on file  Intimate Partner Violence: Not At Risk (06/16/2020)   Humiliation, Afraid, Rape, and Kick questionnaire    Fear of Current or Ex-Partner: No    Emotionally Abused: No    Physically Abused: No    Sexually Abused: No    FAMILY HISTORY: Family History  Problem Relation Age of Onset   Cancer Mother 57       colon   Cancer Sister        breast   Crohn's disease Sister    Cancer Daughter        anal   Crohn's disease Niece    CAD Neg Hx    Stroke Neg Hx    Diabetes Neg Hx    Prostate cancer Neg Hx    Kidney cancer Neg Hx    Bladder Cancer Neg Hx     ALLERGIES:  has No Known Allergies.  MEDICATIONS:  Current Outpatient Medications  Medication Sig Dispense Refill   aspirin EC 81 MG tablet Take 1 tablet (81 mg total) by mouth daily. 60 tablet 1   atorvastatin (LIPITOR) 40 MG tablet Take 1 tablet (40 mg total) by mouth daily at 6 PM. 60 tablet 1   Cholecalciferol (VITAMIN D3) 25 MCG (1000 UT) CAPS Take 1 capsule (1,000 Units total) by mouth daily. 30 capsule    Cyanocobalamin (B-12) 1000 MCG SUBL Place 1 tablet under the tongue daily.     DULoxetine (CYMBALTA) 30 MG capsule Take 1 capsule (30 mg total) by mouth daily. 30 capsule 3   ferrous sulfate 324 (65 Fe) MG TBEC Take 1 tablet (325 mg total) by mouth every other day.     lidocaine-prilocaine (EMLA) cream Apply 1 application topically as needed (to port a cath 1 hour prior to each chemotherapy). 30 g 3   metoprolol tartrate (LOPRESSOR) 25 MG tablet Take 1 tablet (25 mg total) by mouth 2 (two) times daily. 60 tablet 1   ondansetron (ZOFRAN) 8 MG tablet Take 1 tablet (8 mg total) by mouth every 8 (eight) hours as needed for nausea or vomiting. 20 tablet 3   pantoprazole (PROTONIX) 40 MG tablet Take  1 tablet (40 mg total) by mouth 2 (two) times daily before a meal. 60 tablet 0   prochlorperazine (COMPAZINE) 10 MG tablet Take 1 tablet (10 mg total) by mouth every 6 (six) hours as needed for nausea or vomiting. 30 tablet 3   tadalafil (CIALIS) 20 MG tablet TAKE ONE TABLET BY MOUTH DAILY AS NEEDED FOR ERECTILE DYSFUNCTION 20 tablet 3   No current facility-administered medications for this visit.   Facility-Administered Medications Ordered in Other Visits  Medication Dose Route Frequency Provider Last Rate Last Admin   0.9 %  sodium chloride infusion   Intravenous Continuous Cammie Sickle, MD   Stopped at 06/26/21 1115   bevacizumab-awwb (MVASI) 800 mg in sodium chloride 0.9 % 100 mL chemo infusion  10 mg/kg (Treatment Plan Recorded) Intravenous Q14 Days Cammie Sickle, MD   Stopped at 06/26/21 1111   fluorouracil (ADRUCIL) 4,650 mg in sodium chloride 0.9 % 57 mL chemo infusion  2,400 mg/m2 (Treatment Plan Recorded) Intravenous 1 day or 1 dose Charlaine Dalton R, MD   Infusion Verify at 06/26/21 1138   heparin lock flush 100 unit/mL  500 Units Intracatheter Once PRN Charlaine Dalton R, MD       heparin lock flush 100 unit/mL  500 Units Intracatheter Once PRN Cammie Sickle, MD       sodium chloride flush (NS) 0.9 % injection 10 mL  10 mL Intravenous PRN Charlaine Dalton R, MD   10 mL at 10/09/20 0855   sodium chloride flush (NS) 0.9 % injection 10 mL  10 mL Intracatheter PRN Cammie Sickle, MD       .  PHYSICAL EXAMINATION: ECOG PERFORMANCE STATUS: 0 - Asymptomatic  Vitals:   06/26/21 0906  BP: (!) 154/87  Pulse: 61  Resp: 20  Temp: 98.7 F (37.1 C)  SpO2: 96%     Filed Weights   06/26/21 0906  Weight: 170 lb 3.2 oz (77.2 kg)    Physical Exam Vitals and nursing note reviewed.  HENT:     Head: Normocephalic and atraumatic.     Mouth/Throat:     Pharynx: Oropharynx is clear.  Eyes:     Extraocular Movements: Extraocular movements  intact.     Pupils: Pupils are equal, round, and reactive to light.  Cardiovascular:     Rate and Rhythm: Normal rate and regular rhythm.  Pulmonary:     Comments: Decreased breath sounds bilaterally.  Abdominal:     Palpations: Abdomen is soft.  Musculoskeletal:        General: Normal range of motion.     Cervical back: Normal range of motion.  Skin:    General: Skin is warm.     Findings: Bruising present.  Neurological:     General: No focal deficit present.     Mental Status: He is alert and oriented to person, place, and time.  Psychiatric:        Behavior: Behavior normal.        Judgment: Judgment normal.    LABORATORY DATA:  I have reviewed the data as listed Lab Results  Component Value Date   WBC 8.6 06/26/2021   HGB 14.2 06/26/2021   HCT 44.3 06/26/2021   MCV 102.5 (H) 06/26/2021   PLT 100 (L) 06/26/2021   Recent Labs    05/15/21 0842 06/12/21 0851 06/26/21 0836  NA 134* 136 136  K 4.2 4.2 4.2  CL 106 107 107  CO2 _0 GLUCOSE 99 136* 99  BUN _1 CREATININE 1.55* 1.60* 1.71*  CALCIUM 8.5* 8.3* 8.6*  GFRNONAA 46* 44* 41*  PROT 6.6 6.6 6.4*  ALBUMIN 3.4* 3.4* 3.3*  AST 29 38 33  ALT 24 32 30  ALKPHOS 230* 248* 252*  BILITOT 0.9 0.5 0.8  No results found for: "CEA"   RADIOGRAPHIC STUDIES: I have personally reviewed the radiological images as listed and agreed with the findings in the report. CT CHEST ABDOMEN PELVIS W CONTRAST  Result Date: 06/08/2021 CLINICAL DATA:  Colon cancer.  Restaging. EXAM: CT CHEST,  ABDOMEN, AND PELVIS WITH CONTRAST TECHNIQUE: Multidetector CT imaging of the chest, abdomen and pelvis was performed following the standard protocol during bolus administration of intravenous contrast. RADIATION DOSE REDUCTION: This exam was performed according to the departmental dose-optimization program which includes automated exposure control, adjustment of the mA and/or kV according to patient size and/or use of iterative  reconstruction technique. CONTRAST:  14m OMNIPAQUE IOHEXOL 300 MG/ML  SOLN COMPARISON:  Abdomen pelvis CT 03/15/2021. Chest abdomen pelvis CT 12/27/2020. FINDINGS: CT CHEST FINDINGS Cardiovascular: The heart size is normal. No substantial pericardial effusion. Coronary artery calcification is evident. Mild atherosclerotic calcification is noted in the wall of the thoracic aorta. Right Port-A-Cath tip is positioned in the distal SVC. Mediastinum/Nodes: No mediastinal lymphadenopathy. There is no hilar lymphadenopathy. Small hiatal hernia. The esophagus has normal imaging features. There is no axillary lymphadenopathy. Lungs/Pleura: Stable biapical pleuroparenchymal scarring. Centrilobular emphsyema noted. Calcified granuloma right middle lobe is unchanged. (99/3). 4 mm anterior right middle lobe nodule on 98/3 is unchanged. 5 mm left lower lobe nodule on 129/3, 4 mm medial left lower lobe nodule on 126/3 and 3 mm paraspinal left lower lobe nodule on 127/3 are all new since previous chest CT of 12/27/2020 and abdomen CT of 03/15/2021 which included this portion of the lung bases. 3 mm posterior right lower lobe nodule on 130/3 is also new since 03/15/2021. Several additional scattered tiny basilar nodules are evident. No pleural effusion. Musculoskeletal: No worrisome lytic or sclerotic osseous abnormality. CT ABDOMEN PELVIS FINDINGS Hepatobiliary: No suspicious focal abnormality within the liver parenchyma. Tiny calcified gallstone evident. No intrahepatic or extrahepatic biliary dilation. Pancreas: No focal mass lesion. No dilatation of the main duct. No intraparenchymal cyst. No peripancreatic edema. Spleen: No splenomegaly. No focal mass lesion. Adrenals/Urinary Tract: No adrenal nodule or mass. Stable tiny cyst lower pole right kidney left kidney unremarkable. No evidence for hydroureter. The urinary bladder appears normal for the degree of distention. Stomach/Bowel: Small hiatal hernia. Stomach otherwise  unremarkable. Duodenum is normally positioned as is the ligament of Treitz. No small bowel wall thickening. No small bowel dilatation. The terminal ileum is normal. Irregular wall thickening noted in the ascending colon at and just distal to the level of the ileocecal valve. There is soft tissue stranding in the pericolonic fat of this region, similar to prior. Circumferential wall thickening in the rectum is similar to prior. Vascular/Lymphatic: There is mild atherosclerotic calcification of the abdominal aorta without aneurysm. No gastrohepatic or hepatoduodenal ligament lymphadenopathy. No retroperitoneal lymphadenopathy. Para-aortic lymphadenopathy seen on PET-CT 09/21/20 is no longer evident. No ileocolic or mesenteric lymphadenopathy on the current study. Index omental nodule measured previously at 11 x 6 mm is 10 x 6 mm today on image 73/2. Reproductive: The prostate gland and seminal vesicles are unremarkable. Other: No intraperitoneal free fluid. Musculoskeletal: No worrisome lytic or sclerotic osseous abnormality. IMPRESSION: 1. Interval development of multiple basilar predominant tiny bilateral pulmonary nodules, all new since previous chest CT of 12/27/2020 and abdomen CT of 03/15/2021. Close attention on follow-up recommended as appearance raises concern for metastatic disease. 2. Wall thickening in the ascending colon, at and just distal to the level of the ileocecal valve with soft tissue stranding in the pericolonic fat of this region, similar to prior. 3. Circumferential wall thickening again noted in the rectum. 4. Para-aortic lymphadenopathy seen on PET-CT 09/21/20 is no longer evident. Index omental nodule measured previously on the 03/15/2021 exam at 11 x 6 mm is 10 x 6 mm today. 5. Cholelithiasis.  6. Small hiatal hernia. 7. Aortic Atherosclerosis (ICD10-I70.0) and Emphysema (ICD10-J43.9). Electronically Signed   By: Misty Stanley M.D.   On: 06/08/2021 17:43    ASSESSMENT & PLAN:   Rectal cancer  ([place])  # STAGE IV- Synchronous primaries a] rectal cancer-10 cm-adenocarcinoma & b] transverse colon-- intramucosal adenoca]; abdominal lymphadenopathy; liver metastases; omental metastases. -Currently on 5-FU plus Bev- CT MAY 26th, 2023-  Interval development of multiple basilar predominant tiny bilateral pulmonary nodules, all new since previous chest CT of 12/27/2020 and abdomen CT of 03/15/2021- concerning for metastatic disease.  Wall thickening in the ascending colon, at and just distal to the level of the ileocecal valve with soft tissue stranding in the pericolonic fat of this region, similar to prior. Circumferential wall thickening again noted in the rectum.; Omental Nodule-STABLE [10x57m].  Given the subcentimeter nature of the nodules recommend monitoring for now.  Continue chemotherapy as planned. STABLE.     # proceed with 5FU+Avastin maintenance continue BEV [see below]  Labs today reviewed;  acceptable for treatment today. Will repeat scans in AUG 2023.   # Anemia/iron deficiency hemoglobin ~13-  secondary to chronic GI bleeding/tumor-STABLE  # Thrombocytopenia/secondary chemotherapy/on Plavix-aspirin-platelets- 96s-monitor closely.  # HTN- 180/102; at home BP reviewed- 130-150/ DBP80-90s.continue checking BP at home/reviewed the log from home.  Proceed with avastin today. STABLE.   # PN G-2-3 sec to Oxaliplatin [last 04/02/2021]. discontinued oxaliplatin for now. Poor tolerance to cymbalta 30 mg q day.   # Chronic kidney disease stage III-[GFR-43 ] -stable.  Continue keeping up with hydration.   # Skin rash: Bil LE- ? Allergic- continue Hydrocortisone prn.   #Incidental findings on Imaging CT may 26th 2023: Emphysema; atherosclerosis; cholelithiasis I reviewed/discussed/counseled the patient.   * medical records- o   # DISPOSITION:   # 5FU+Bev today;  # Follow up in 2 weeks- MD: labs- cbc/cmp;UA CEA;  5FU+ bev; Pump off on D-3; Dr.B  Rectal cancer ([place]  MD 06/26/2021   CC: Dr. BRogue Bussing

## 2021-06-26 NOTE — Assessment & Plan Note (Addendum)
#   STAGE IV- Synchronous primaries a] rectal cancer-10 cm-adenocarcinoma & b] transverse colon-- intramucosal adenoca]; abdominal lymphadenopathy; liver metastases; omental metastases. -Currently on 5-FU plus Bev- CT MAY 26th, 2023-  Interval development of multiple basilar predominant tiny bilateral pulmonary nodules, all new since previous chest CT of 12/27/2020 and abdomen CT of 03/15/2021- concerning for metastatic disease.  Wall thickening in the ascending colon, at and just distal to the level of the ileocecal valve with soft tissue stranding in the pericolonic fat of this region, similar to prior. Circumferential wall thickening again noted in the rectum.; Omental Nodule-STABLE [10x53m].  Given the subcentimeter nature of the nodules recommend monitoring for now.  Continue chemotherapy as planned. STABLE.   # proceed with 5FU+Avastin maintenance continue BEV [see below]  Labs today reviewed;  acceptable for treatment today. Will repeat scans in AUG 2023.   # Anemia/iron deficiency hemoglobin ~13-  secondary to chronic GI bleeding/tumor-STABLE  # Thrombocytopenia/secondary chemotherapy/on Plavix-aspirin-platelets- 96s-monitor closely.  # HTN- 180/102; at home BP reviewed- 130-150/ DBP80-90s.continue checking BP at home/reviewed the log from home.  Proceed with avastin today. STABLE.   # PN G-2-3 sec to Oxaliplatin [last 04/02/2021]. discontinued oxaliplatin for now. Poor tolerance to cymbalta 30 mg q day.   # Chronic kidney disease stage III-[GFR-43 ] -stable.  Continue keeping up with hydration.   # Skin rash: Bil LE- ? Allergic- continue Hydrocortisone prn.   #Incidental findings on Imaging CT may 26th 2023: Emphysema; atherosclerosis; cholelithiasis I reviewed/discussed/counseled the patient.   * medical records- o  # DISPOSITION:   # 5FU+Bev today;  # Follow up in 2 weeks- MD: labs- cbc/cmp;UA CEA;  5FU+ bev; Pump off on D-3; Dr.B

## 2021-06-26 NOTE — Patient Instructions (Signed)
[facility] [place]  Discharge Instructions: Thank you for choosing [facility] to provide your oncology and hematology care.  If you have a lab appointment with the [facility], please go directly to the [facility] and check in at the registration area.  Wear comfortable clothing and clothing appropriate for easy access to any Portacath or PICC line.   We strive to give you quality time with your provider. You may need to reschedule your appointment if you arrive late (15 or more minutes).  Arriving late affects you and other patients whose appointments are after yours.  Also, if you miss three or more appointments without notifying the office, you may be dismissed from the clinic at the provider's discretion.      For prescription refill requests, have your pharmacy contact our office and allow 72 hours for refills to be completed.    Today you received the following chemotherapy and/or immunotherapy agents MVASI and 5 FU      To help prevent nausea and vomiting after your treatment, we encourage you to take your nausea medication as directed.  BELOW ARE SYMPTOMS THAT SHOULD BE REPORTED IMMEDIATELY: *FEVER GREATER THAN 100.4 F (38 C) OR HIGHER *CHILLS OR SWEATING *NAUSEA AND VOMITING THAT IS NOT CONTROLLED WITH YOUR NAUSEA MEDICATION *UNUSUAL SHORTNESS OF BREATH *UNUSUAL BRUISING OR BLEEDING *URINARY PROBLEMS (pain or burning when urinating, or frequent urination) *BOWEL PROBLEMS (unusual diarrhea, constipation, pain near the anus) TENDERNESS IN MOUTH AND THROAT WITH OR WITHOUT PRESENCE OF ULCERS (sore throat, sores in mouth, or a toothache) UNUSUAL RASH, SWELLING OR PAIN  UNUSUAL VAGINAL DISCHARGE OR ITCHING   Items with * indicate a potential emergency and should be followed up as soon as possible or go to the Emergency Department if any problems should occur.  Please show the CHEMOTHERAPY ALERT CARD or IMMUNOTHERAPY ALERT CARD at check-in  to the Emergency Department and triage nurse.  Should you have questions after your visit or need to cancel or reschedule your appointment, please contact [facility] [place] [facility] [place]  [phone] and follow the prompts.  Office hours are 8:00 a.m. to 4:30 p.m. Monday - Friday. Please note that voicemails left after 4:00 p.m. may not be returned until the following business day.  We are closed weekends and major holidays. You have access to a nurse at all times for urgent questions. Please call the main number to the clinic [phone] and follow the prompts.  For any non-urgent questions, you may also contact your provider using MyChart. We now offer e-Visits for anyone 39 and older to request care online for non-urgent symptoms. For details visit [email or web address].   Also download the MyChart app! Go to the app store, search "MyChart", open the app, select [facility], and log in with your MyChart username and password.  Due to Covid, a mask is required upon entering the hospital/clinic. If you do not have a mask, one will be given to you upon arrival. For doctor visits, patients may have 1 support person aged 45 or older with them. For treatment visits, patients cannot have anyone with them due to current Covid guidelines and our immunocompromised population.   Bevacizumab injection What is this medication? BEVACIZUMAB (be va SIZ yoo mab) is a monoclonal antibody. It is used to treat many types of cancer. This medicine may be used for other purposes; ask your health care provider or pharmacist if you have questions. COMMON BRAND NAME(S): Alymsys, Avastin, MVASI, Zirabev What  should I tell my care team before I take this medication? They need to know if you have any of these conditions: diabetes heart disease high blood pressure history of coughing up blood prior anthracycline chemotherapy (e.g., doxorubicin, daunorubicin, epirubicin) recent or ongoing radiation  therapy recent or planning to have surgery stroke an unusual or allergic reaction to bevacizumab, hamster proteins, mouse proteins, other medicines, foods, dyes, or preservatives pregnant or trying to get pregnant breast-feeding How should I use this medication? This medicine is for infusion into a vein. It is given by a health care professional in a hospital or clinic setting. Talk to your pediatrician regarding the use of this medicine in children. Special care may be needed. Overdosage: If you think you have taken too much of this medicine contact a poison control center or emergency room at once. NOTE: This medicine is only for you. Do not share this medicine with others. What if I miss a dose? It is important not to miss your dose. Call your doctor or health care professional if you are unable to keep an appointment. What may interact with this medication? Interactions are not expected. This list may not describe all possible interactions. Give your health care provider a list of all the medicines, herbs, non-prescription drugs, or dietary supplements you use. Also tell them if you smoke, drink alcohol, or use illegal drugs. Some items may interact with your medicine. What should I watch for while using this medication? Your condition will be monitored carefully while you are receiving this medicine. You will need important blood work and urine testing done while you are taking this medicine. This medicine may increase your risk to bruise or bleed. Call your doctor or health care professional if you notice any unusual bleeding. Before having surgery, talk to your health care provider to make sure it is ok. This drug can increase the risk of poor healing of your surgical site or wound. You will need to stop this drug for 28 days before surgery. After surgery, wait at least 28 days before restarting this drug. Make sure the surgical site or wound is healed enough before restarting this drug.  Talk to your health care provider if questions. Do not become pregnant while taking this medicine or for 6 months after stopping it. Women should inform their doctor if they wish to become pregnant or think they might be pregnant. There is a potential for serious side effects to an unborn child. Talk to your health care professional or pharmacist for more information. Do not breast-feed an infant while taking this medicine and for 6 months after the last dose. This medicine has caused ovarian failure in some women. This medicine may interfere with the ability to have a child. You should talk to your doctor or health care professional if you are concerned about your fertility. What side effects may I notice from receiving this medication? Side effects that you should report to your doctor or health care professional as soon as possible: allergic reactions like skin rash, itching or hives, swelling of the face, lips, or tongue chest pain or chest tightness chills coughing up blood high fever seizures severe constipation signs and symptoms of bleeding such as bloody or black, tarry stools; red or dark-brown urine; spitting up blood or brown material that looks like coffee grounds; red spots on the skin; unusual bruising or bleeding from the eye, gums, or nose signs and symptoms of a blood clot such as breathing problems; chest pain; severe,  sudden headache; pain, swelling, warmth in the leg signs and symptoms of a stroke like changes in vision; confusion; trouble speaking or understanding; severe headaches; sudden numbness or weakness of the face, arm or leg; trouble walking; dizziness; loss of balance or coordination stomach pain sweating swelling of legs or ankles vomiting weight gain Side effects that usually do not require medical attention (report to your doctor or health care professional if they continue or are bothersome): back pain changes in taste decreased appetite dry  skin nausea tiredness This list may not describe all possible side effects. Call your doctor for medical advice about side effects. You may report side effects to FDA at 1-800-FDA-1088. Where should I keep my medication? This drug is given in a hospital or clinic and will not be stored at home. NOTE: This sheet is a summary. It may not cover all possible information. If you have questions about this medicine, talk to your doctor, pharmacist, or health care provider.  2023 Elsevier/Gold Standard (2020-12-01 00:00:00)   Fluorouracil, 5-FU injection What is this medication? FLUOROURACIL, 5-FU (flure oh YOOR a sil) is a chemotherapy drug. It slows the growth of cancer cells. This medicine is used to treat many types of cancer like breast cancer, colon or rectal cancer, pancreatic cancer, and stomach cancer. This medicine may be used for other purposes; ask your health care provider or pharmacist if you have questions. COMMON BRAND NAME(S): Adrucil What should I tell my care team before I take this medication? They need to know if you have any of these conditions: blood disorders dihydropyrimidine dehydrogenase (DPD) deficiency infection (especially a virus infection such as chickenpox, cold sores, or herpes) kidney disease liver disease malnourished, poor nutrition recent or ongoing radiation therapy an unusual or allergic reaction to fluorouracil, other chemotherapy, other medicines, foods, dyes, or preservatives pregnant or trying to get pregnant breast-feeding How should I use this medication? This drug is given as an infusion or injection into a vein. It is administered in a hospital or clinic by a specially trained health care professional. Talk to your pediatrician regarding the use of this medicine in children. Special care may be needed. Overdosage: If you think you have taken too much of this medicine contact a poison control center or emergency room at once. NOTE: This medicine  is only for you. Do not share this medicine with others. What if I miss a dose? It is important not to miss your dose. Call your doctor or health care professional if you are unable to keep an appointment. What may interact with this medication? Do not take this medicine with any of the following medications: live virus vaccines This medicine may also interact with the following medications: medicines that treat or prevent blood clots like warfarin, enoxaparin, and dalteparin This list may not describe all possible interactions. Give your health care provider a list of all the medicines, herbs, non-prescription drugs, or dietary supplements you use. Also tell them if you smoke, drink alcohol, or use illegal drugs. Some items may interact with your medicine. What should I watch for while using this medication? Visit your doctor for checks on your progress. This drug may make you feel generally unwell. This is not uncommon, as chemotherapy can affect healthy cells as well as cancer cells. Report any side effects. Continue your course of treatment even though you feel ill unless your doctor tells you to stop. In some cases, you may be given additional medicines to help with side effects. Follow all  directions for their use. Call your doctor or health care professional for advice if you get a fever, chills or sore throat, or other symptoms of a cold or flu. Do not treat yourself. This drug decreases your body's ability to fight infections. Try to avoid being around people who are sick. This medicine may increase your risk to bruise or bleed. Call your doctor or health care professional if you notice any unusual bleeding. Be careful brushing and flossing your teeth or using a toothpick because you may get an infection or bleed more easily. If you have any dental work done, tell your dentist you are receiving this medicine. Avoid taking products that contain aspirin, acetaminophen, ibuprofen, naproxen, or  ketoprofen unless instructed by your doctor. These medicines may hide a fever. Do not become pregnant while taking this medicine. Women should inform their doctor if they wish to become pregnant or think they might be pregnant. There is a potential for serious side effects to an unborn child. Talk to your health care professional or pharmacist for more information. Do not breast-feed an infant while taking this medicine. Men should inform their doctor if they wish to father a child. This medicine may lower sperm counts. Do not treat diarrhea with over the counter products. Contact your doctor if you have diarrhea that lasts more than 2 days or if it is severe and watery. This medicine can make you more sensitive to the sun. Keep out of the sun. If you cannot avoid being in the sun, wear protective clothing and use sunscreen. Do not use sun lamps or tanning beds/booths. What side effects may I notice from receiving this medication? Side effects that you should report to your doctor or health care professional as soon as possible: allergic reactions like skin rash, itching or hives, swelling of the face, lips, or tongue low blood counts - this medicine may decrease the number of white blood cells, red blood cells and platelets. You may be at increased risk for infections and bleeding. signs of infection - fever or chills, cough, sore throat, pain or difficulty passing urine signs of decreased platelets or bleeding - bruising, pinpoint red spots on the skin, black, tarry stools, blood in the urine signs of decreased red blood cells - unusually weak or tired, fainting spells, lightheadedness breathing problems changes in vision chest pain mouth sores nausea and vomiting pain, swelling, redness at site where injected pain, tingling, numbness in the hands or feet redness, swelling, or sores on hands or feet stomach pain unusual bleeding Side effects that usually do not require medical attention  (report to your doctor or health care professional if they continue or are bothersome): changes in finger or toe nails diarrhea dry or itchy skin hair loss headache loss of appetite sensitivity of eyes to the light stomach upset unusually teary eyes This list may not describe all possible side effects. Call your doctor for medical advice about side effects. You may report side effects to FDA at 1-800-FDA-1088. Where should I keep my medication? This drug is given in a hospital or clinic and will not be stored at home. NOTE: This sheet is a summary. It may not cover all possible information. If you have questions about this medicine, talk to your doctor, pharmacist, or health care provider.  2023 Elsevier/Gold Standard (2020-12-01 00:00:00)

## 2021-06-27 LAB — CEA: CEA: 3.2 ng/mL (ref 0.0–4.7)

## 2021-06-28 ENCOUNTER — Inpatient Hospital Stay: Payer: Medicare HMO

## 2021-06-28 VITALS — BP 125/75 | HR 66

## 2021-06-28 DIAGNOSIS — I129 Hypertensive chronic kidney disease with stage 1 through stage 4 chronic kidney disease, or unspecified chronic kidney disease: Secondary | ICD-10-CM | POA: Diagnosis not present

## 2021-06-28 DIAGNOSIS — C787 Secondary malignant neoplasm of liver and intrahepatic bile duct: Secondary | ICD-10-CM | POA: Diagnosis not present

## 2021-06-28 DIAGNOSIS — N183 Chronic kidney disease, stage 3 unspecified: Secondary | ICD-10-CM | POA: Diagnosis not present

## 2021-06-28 DIAGNOSIS — Z79899 Other long term (current) drug therapy: Secondary | ICD-10-CM | POA: Diagnosis not present

## 2021-06-28 DIAGNOSIS — D696 Thrombocytopenia, unspecified: Secondary | ICD-10-CM | POA: Diagnosis not present

## 2021-06-28 DIAGNOSIS — C20 Malignant neoplasm of rectum: Secondary | ICD-10-CM

## 2021-06-28 DIAGNOSIS — C786 Secondary malignant neoplasm of retroperitoneum and peritoneum: Secondary | ICD-10-CM | POA: Diagnosis not present

## 2021-06-28 DIAGNOSIS — Z5111 Encounter for antineoplastic chemotherapy: Secondary | ICD-10-CM | POA: Diagnosis not present

## 2021-06-28 DIAGNOSIS — C184 Malignant neoplasm of transverse colon: Secondary | ICD-10-CM | POA: Diagnosis not present

## 2021-06-28 DIAGNOSIS — D509 Iron deficiency anemia, unspecified: Secondary | ICD-10-CM | POA: Diagnosis not present

## 2021-06-28 MED ORDER — HEPARIN SOD (PORK) LOCK FLUSH 100 UNIT/ML IV SOLN
500.0000 [IU] | Freq: Once | INTRAVENOUS | Status: AC | PRN
  Administered 2021-06-28: 500 [IU]
  Filled 2021-06-28: qty 5

## 2021-06-28 MED ORDER — SODIUM CHLORIDE 0.9% FLUSH
10.0000 mL | INTRAVENOUS | Status: DC | PRN
  Administered 2021-06-28: 10 mL
  Filled 2021-06-28: qty 10

## 2021-06-29 ENCOUNTER — Encounter: Payer: Self-pay | Admitting: Gastroenterology

## 2021-06-29 ENCOUNTER — Ambulatory Visit: Payer: Medicare HMO | Admitting: Anesthesiology

## 2021-06-29 ENCOUNTER — Ambulatory Visit
Admission: RE | Admit: 2021-06-29 | Discharge: 2021-06-29 | Disposition: A | Discharge status: Home / Self Care | Payer: Medicare HMO | Source: Ambulatory Visit | Attending: Gastroenterology | Admitting: Gastroenterology

## 2021-06-29 ENCOUNTER — Encounter
Admission: RE | Disposition: A | Discharge status: Home / Self Care | Payer: Self-pay | Source: Ambulatory Visit | Attending: Gastroenterology

## 2021-06-29 DIAGNOSIS — K219 Gastro-esophageal reflux disease without esophagitis: Secondary | ICD-10-CM | POA: Diagnosis not present

## 2021-06-29 DIAGNOSIS — K449 Diaphragmatic hernia without obstruction or gangrene: Secondary | ICD-10-CM | POA: Diagnosis not present

## 2021-06-29 DIAGNOSIS — I10 Essential (primary) hypertension: Secondary | ICD-10-CM | POA: Diagnosis not present

## 2021-06-29 DIAGNOSIS — K209 Esophagitis, unspecified without bleeding: Secondary | ICD-10-CM | POA: Insufficient documentation

## 2021-06-29 DIAGNOSIS — I252 Old myocardial infarction: Secondary | ICD-10-CM | POA: Insufficient documentation

## 2021-06-29 DIAGNOSIS — K3189 Other diseases of stomach and duodenum: Secondary | ICD-10-CM | POA: Diagnosis not present

## 2021-06-29 DIAGNOSIS — I2510 Atherosclerotic heart disease of native coronary artery without angina pectoris: Secondary | ICD-10-CM | POA: Diagnosis not present

## 2021-06-29 DIAGNOSIS — J439 Emphysema, unspecified: Secondary | ICD-10-CM | POA: Insufficient documentation

## 2021-06-29 DIAGNOSIS — Z87891 Personal history of nicotine dependence: Secondary | ICD-10-CM | POA: Insufficient documentation

## 2021-06-29 DIAGNOSIS — K2210 Ulcer of esophagus without bleeding: Secondary | ICD-10-CM

## 2021-06-29 DIAGNOSIS — E785 Hyperlipidemia, unspecified: Secondary | ICD-10-CM | POA: Diagnosis not present

## 2021-06-29 DIAGNOSIS — Z955 Presence of coronary angioplasty implant and graft: Secondary | ICD-10-CM | POA: Diagnosis not present

## 2021-06-29 HISTORY — PX: ESOPHAGOGASTRODUODENOSCOPY: SHX5428

## 2021-06-29 SURGERY — EGD (ESOPHAGOGASTRODUODENOSCOPY)
Anesthesia: General

## 2021-06-29 MED ORDER — SODIUM CHLORIDE 0.9 % IV SOLN
INTRAVENOUS | Status: DC
  Administered 2021-06-29: 1000 mL via INTRAVENOUS

## 2021-06-29 MED ORDER — PROPOFOL 500 MG/50ML IV EMUL
INTRAVENOUS | Status: DC | PRN
  Administered 2021-06-29: 140 ug/kg/min via INTRAVENOUS

## 2021-06-29 MED ORDER — PROPOFOL 10 MG/ML IV BOLUS
INTRAVENOUS | Status: DC | PRN
  Administered 2021-06-29: 70 mg via INTRAVENOUS

## 2021-06-29 NOTE — Anesthesia Postprocedure Evaluation (Signed)
Anesthesia Post Note  Patient: Albert Giles  Procedure(s) Performed: ESOPHAGOGASTRODUODENOSCOPY (EGD)  Patient location during evaluation: Endoscopy Anesthesia Type: General Level of consciousness: awake and alert Pain management: pain level controlled Vital Signs Assessment: post-procedure vital signs reviewed and stable Respiratory status: spontaneous breathing, nonlabored ventilation, respiratory function stable and patient connected to nasal cannula oxygen Cardiovascular status: blood pressure returned to baseline and stable Postop Assessment: no apparent nausea or vomiting Anesthetic complications: no   No notable events documented.   Last Vitals:  Vitals:   06/29/21 1243 06/29/21 1253  BP: (!) 154/92 (!) 148/90  Pulse: 60 (!) 59  Resp: 18 17  Temp:    SpO2: 99% 99%    Last Pain:  Vitals:   06/29/21 1253  TempSrc:   PainSc: 0-No pain                 Precious Haws Amunique Neyra

## 2021-06-29 NOTE — Transfer of Care (Signed)
Immediate Anesthesia Transfer of Care Note  Patient: Albert Giles  Procedure(s) Performed: ESOPHAGOGASTRODUODENOSCOPY (EGD)  Patient Location: PACU  Anesthesia Type:General  Level of Consciousness: awake, alert  and oriented  Airway & Oxygen Therapy: Patient Spontanous Breathing  Post-op Assessment: Report given to RN and Post -op Vital signs reviewed and stable  Post vital signs: Reviewed and stable  Last Vitals:  Vitals Value Taken Time  BP 120/75 06/29/21 1233  Temp    Pulse 60 06/29/21 1233  Resp    SpO2 98 % 06/29/21 1233    Last Pain:  Vitals:   06/29/21 1233  TempSrc:   PainSc: 0-No pain         Complications: No notable events documented.

## 2021-06-29 NOTE — Op Note (Signed)
[facility] Patient Name: Albert Giles Procedure Date: 06/29/2021 12:03 PM MRN: [id number] Account #: [id number] Date of Birth: 01/14/1946 Admit Type: Outpatient Age: 76 Room: [facility] ENDO ROOM 2 Gender: Male Note Status: Finalized Instrument Name: Upper Endoscope [id number] Procedure:             Upper GI endoscopy Indications:           Follow-up of esophagitis Providers:             Lin Landsman MD, MD Referring MD:          [name] (Referring MD) Medicines:             General Anesthesia Complications:         No immediate complications. Estimated blood loss: None. Procedure:             Pre-Anesthesia Assessment:                        - Prior to the procedure, a History and Physical was                         performed, and patient medications and allergies were                         reviewed. The patient is competent. The risks and                         benefits of the procedure and the sedation options and                         risks were discussed with the patient. All questions                         were answered and informed consent was obtained.                         Patient identification and proposed procedure were                         verified by the physician, the nurse, the                         anesthesiologist, the anesthetist and the technician                         in the pre-procedure area in the procedure room in the                         endoscopy suite. Mental Status Examination: alert and                         oriented. Airway Examination: normal oropharyngeal                         airway and neck mobility. Respiratory Examination:                         clear to auscultation. CV Examination: normal.  Prophylactic Antibiotics: The patient does not require                         prophylactic antibiotics. Prior Anticoagulants: The                         patient has  taken no previous anticoagulant or                         antiplatelet agents. ASA Grade Assessment: III - A                         patient with severe systemic disease. After reviewing                         the risks and benefits, the patient was deemed in                         satisfactory condition to undergo the procedure. The                         anesthesia plan was to use general anesthesia.                         Immediately prior to administration of medications,                         the patient was re-assessed for adequacy to receive                         sedatives. The heart rate, respiratory rate, oxygen                         saturations, blood pressure, adequacy of pulmonary                         ventilation, and response to care were monitored                         throughout the procedure. The physical status of the                         patient was re-assessed after the procedure.                        After obtaining informed consent, the endoscope was                         passed under direct vision. Throughout the procedure,                         the patient's blood pressure, pulse, and oxygen                         saturations were monitored continuously. The Endoscope                         was introduced through the mouth, and advanced to the  second part of duodenum. The upper GI endoscopy was                         accomplished without difficulty. The patient tolerated                         the procedure well. Findings:      The duodenal bulb and second portion of the duodenum were normal.      Diffuse mildly erythematous mucosa without bleeding was found in the       entire examined stomach.      The gastroesophageal junction and examined esophagus were normal.      A medium-sized hiatal hernia was present. Impression:            - Normal duodenal bulb and second portion of the                          duodenum.                        - Erythematous mucosa in the stomach.                        - Normal gastroesophageal junction and esophagus.                        - Medium-sized hiatal hernia.                        - No specimens collected. Recommendation:        - Discharge patient to home (with escort).                        - Resume previous diet today.                        - Continue present medications.                        - Follow an antireflux regimen for the rest of the                         patient's life.                        - Use a proton pump inhibitor PO BID indefinitely. Procedure Code(s):     --- Professional ---                        [phone], Esophagogastroduodenoscopy, flexible,                         transoral; diagnostic, including collection of                         specimen(s) by brushing or washing, when performed                         (separate procedure) Diagnosis Code(s):     --- Professional ---  K31.89, Other diseases of stomach and duodenum                        K20.90, Esophagitis, unspecified without bleeding                        K44.9, Diaphragmatic hernia without obstruction or                         gangrene CPT copyright 2019 [employer]. All rights reserved. The codes documented in this report are preliminary and upon coder review may  be revised to meet current compliance requirements. Dr. Ulyess Mort Lin Landsman MD, MD 06/29/2021 12:33:44 PM This report has been signed electronically. Number of Addenda: 0 Note Initiated On: 06/29/2021 12:03 PM Estimated Blood Loss:  Estimated blood loss: none.      [facility]

## 2021-06-29 NOTE — H&P (Signed)
Albert Darby, MD [address]  [place]  [place], [place] [address]  Main: [phone]  Fax: [phone] Pager: [phone]  Primary Care Physician:  [name], MD Primary Gastroenterologist:  Dr. Cephas Giles  Pre-Procedure History & Physical: HPI:  Albert Giles is a 76 y.o. male is here for an endoscopy.   Past Medical History:  Diagnosis Date   CAD (coronary artery disease) 06/2014   UA with NSTEMI - cath with 99% prox L circ s/p stent, EF 40% ([place] [place] at [facility])   Emphysema    mild   Ex-smoker    Family history of breast cancer    Family history of colon cancer    Family history of pancreatic cancer    NSTEMI (non-ST elevated myocardial infarction) ([place]) 07/13/2014    Past Surgical History:  Procedure Laterality Date   CARDIAC CATHETERIZATION N/A 07/14/2014   Left Heart Cath and Coronary Angiography with stent placement;  Surgeon: Teodoro Spray, MD   CARDIAC CATHETERIZATION N/A 07/14/2014   Coronary Stent Intervention;  Surgeon: Yolonda Kida, MD   COLONOSCOPY WITH PROPOFOL N/A 09/06/2020   Procedure: COLONOSCOPY WITH PROPOFOL;  Surgeon: Lin Landsman, MD;  Location: [facility] ENDOSCOPY;  Service: Gastroenterology;  Laterality: N/A;   CYSTECTOMY     on back   ESOPHAGOGASTRODUODENOSCOPY N/A 09/06/2020   Procedure: ESOPHAGOGASTRODUODENOSCOPY (EGD);  Surgeon: Lin Landsman, MD;  Location: [facility] ENDOSCOPY;  Service: Gastroenterology;  Laterality: N/A;   INGUINAL HERNIA REPAIR Right 08/17/04   IR IMAGING GUIDED PORT INSERTION  09/13/2020    Prior to Admission medications   Medication Sig Start Date End Date Taking? Authorizing Provider  aspirin EC 81 MG tablet Take 1 tablet (81 mg total) by mouth daily. 07/15/14  Yes Henreitta Leber, MD  atorvastatin (LIPITOR) 40 MG tablet Take 1 tablet (40 mg total) by mouth daily at 6 PM. 07/15/14  Yes Sainani, Belia Heman, MD  Cholecalciferol (VITAMIN D3) 25 MCG (1000 UT) CAPS Take 1 capsule (1,000 Units total)  by mouth daily. 09/13/19  Yes [name], MD  Cyanocobalamin (B-12) 1000 MCG SUBL Place 1 tablet under the tongue daily. 09/13/19  Yes [name], MD  DULoxetine (CYMBALTA) 30 MG capsule Take 1 capsule (30 mg total) by mouth daily. 05/15/21  Yes Cammie Sickle, MD  metoprolol tartrate (LOPRESSOR) 25 MG tablet Take 1 tablet (25 mg total) by mouth 2 (two) times daily. 07/15/14  Yes Sainani, Belia Heman, MD  ondansetron (ZOFRAN) 8 MG tablet Take 1 tablet (8 mg total) by mouth every 8 (eight) hours as needed for nausea or vomiting. 09/14/20  Yes Cammie Sickle, MD  pantoprazole (PROTONIX) 40 MG tablet Take 1 tablet (40 mg total) by mouth 2 (two) times daily before a meal. 06/13/21  Yes Hilbert Briggs, Tally Due, MD  prochlorperazine (COMPAZINE) 10 MG tablet Take 1 tablet (10 mg total) by mouth every 6 (six) hours as needed for nausea or vomiting. 09/14/20  Yes Cammie Sickle, MD  ferrous sulfate 324 (65 Fe) MG TBEC Take 1 tablet (325 mg total) by mouth every other day. 06/21/20 06/26/21  [name], MD  lidocaine-prilocaine (EMLA) cream Apply 1 application topically as needed (to port a cath 1 hour prior to each chemotherapy). 09/14/20   Cammie Sickle, MD  tadalafil (CIALIS) 20 MG tablet TAKE ONE TABLET BY MOUTH DAILY AS NEEDED FOR ERECTILE DYSFUNCTION 05/10/21   Zara Council A, PA-C    Allergies as of 06/25/2021   (No  Known Allergies)    Family History  Problem Relation Age of Onset   Cancer Mother 81       colon   Cancer Sister        breast   Crohn's disease Sister    Cancer Daughter        anal   Crohn's disease Niece    CAD Neg Hx    Stroke Neg Hx    Diabetes Neg Hx    Prostate cancer Neg Hx    Kidney cancer Neg Hx    Bladder Cancer Neg Hx     Social History   Socioeconomic History   Marital status: Married    Spouse name: Not on file   Number of children: Not on file   Years of education: Not on file   Highest education level: Not on file   Occupational History   Not on file  Tobacco Use   Smoking status: Former    Packs/day: 1.00    Years: 35.00    Total pack years: 35.00    Types: Cigarettes    Quit date: 07/07/1998    Years since quitting: 22.9   Smokeless tobacco: Never  Vaping Use   Vaping Use: Never used  Substance and Sexual Activity   Alcohol use: No   Drug use: No   Sexual activity: Yes  Other Topics Concern   Not on file  Social History Narrative   Caffeine: 2 cups coffee, 2 cups tea/day   Lives with wife   Occupation: Higher education careers adviser, retired   Edu: HS   Activity: works in yard, walking   Diet: some water, fruits/vegetables daily   -------------------------------------------------------------------       [name]; [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    Social Determinants of Health   Financial Resource Strain: Low Risk  (06/16/2020)   Overall Financial Resource Strain (CARDIA)    Difficulty of Paying Living Expenses: Not hard at all  Food Insecurity: No Food Insecurity (06/16/2020)   Hunger Vital Sign    Worried About Running Out of Food in the Last Year: Never true    Ran Out of Food in the Last Year: Never true  Transportation Needs: No Transportation Needs (06/16/2020)   PRAPARE - Hydrologist (Medical): No    Lack of Transportation (Non-Medical): No  Physical Activity: Inactive (06/16/2020)   Exercise Vital Sign    Days of Exercise per Week: 0 days    Minutes of Exercise per Session: 0 min  Stress: No Stress Concern Present (06/16/2020)   [place]    Feeling of Stress : Not at all  Social Connections: Not on file  Intimate Partner Violence: Not At Risk (06/16/2020)   Humiliation, Afraid, Rape, and Kick questionnaire    Fear of Current or Ex-Partner: No    Emotionally Abused: No    Physically Abused: No    Sexually Abused: No     Review of Systems: See HPI, otherwise negative ROS  Physical Exam: BP (!) 154/93   Pulse 63   Temp 98 F (36.7 C) (Temporal)   Resp 15   Ht '5\' 8"'$  (1.727 m)   Wt 77.1 kg   SpO2 97%   BMI 25.85 kg/m  General:   Alert,  pleasant and cooperative in NAD Head:  Normocephalic and atraumatic. Neck:  Supple; no masses or thyromegaly. Lungs:  Clear throughout to auscultation.    Heart:  Regular rate and rhythm. Abdomen:  Soft, nontender and nondistended. Normal bowel sounds, without guarding, and without rebound.   Neurologic:  Alert and  oriented x4;  grossly normal neurologically.  Impression/Plan: [name] is here for an endoscopy to be performed for follow-up of erosive esophagitis  Risks, benefits, limitations, and alternatives regarding  endoscopy have been reviewed with the patient.  Questions have been answered.  All parties agreeable.   Sherri Sear, MD  06/29/2021, 11:03 AM

## 2021-06-29 NOTE — Anesthesia Preprocedure Evaluation (Signed)
Anesthesia Evaluation  Patient identified by MRN, date of birth, ID band Patient awake    Reviewed: Allergy & Precautions, NPO status , Patient's Chart, lab work & pertinent test results  History of Anesthesia Complications Negative for: history of anesthetic complications  Airway Mallampati: III  TM Distance: <3 FB Neck ROM: full    Dental  (+) Chipped   Pulmonary neg shortness of breath, COPD, former smoker,    Pulmonary exam normal        Cardiovascular Exercise Tolerance: Good hypertension, + CAD and + Past MI  Normal cardiovascular exam     Neuro/Psych  Neuromuscular disease negative psych ROS   GI/Hepatic negative GI ROS, Neg liver ROS, neg GERD  ,  Endo/Other  negative endocrine ROS  Renal/GU Renal disease  negative genitourinary   Musculoskeletal   Abdominal   Peds  Hematology negative hematology ROS (+)   Anesthesia Other Findings Past Medical History: 06/2014: CAD (coronary artery disease)     Comment:  UA with NSTEMI - cath with 99% prox L circ s/p stent, EF              40% ([facility] [place] at [facility]) No date: Emphysema     Comment:  mild No date: Ex-smoker No date: Family history of breast cancer No date: Family history of colon cancer No date: Family history of pancreatic cancer 07/13/2014: NSTEMI (non-ST elevated myocardial infarction) [facility])  Past Surgical History: 07/14/2014: CARDIAC CATHETERIZATION; N/A     Comment:  Left Heart Cath and Coronary Angiography with stent               placement;  Surgeon: Teodoro Spray, MD 07/14/2014: CARDIAC CATHETERIZATION; N/A     Comment:  Coronary Stent Intervention;  Surgeon: Yolonda Kida, MD 09/06/2020: COLONOSCOPY WITH PROPOFOL; N/A     Comment:  Procedure: COLONOSCOPY WITH PROPOFOL;  Surgeon: Lin Landsman, MD;  Location: [facility] ENDOSCOPY;  Service:               Gastroenterology;  Laterality: N/A; No date:  CYSTECTOMY     Comment:  on back 09/06/2020: ESOPHAGOGASTRODUODENOSCOPY; N/A     Comment:  Procedure: ESOPHAGOGASTRODUODENOSCOPY (EGD);  Surgeon:               Lin Landsman, MD;  Location[facility] ENDOSCOPY;                Service: Gastroenterology;  Laterality: N/A; 08/17/04: INGUINAL HERNIA REPAIR; Right 09/13/2020: IR IMAGING GUIDED PORT INSERTION  BMI    Body Mass Index: 25.85 kg/m      Reproductive/Obstetrics negative OB ROS                             Anesthesia Physical Anesthesia Plan  ASA: 3  Anesthesia Plan: General   Post-op Pain Management:    Induction: Intravenous  PONV Risk Score and Plan: Propofol infusion and TIVA  Airway Management Planned: Natural Airway and Nasal Cannula  Additional Equipment:   Intra-op Plan:   Post-operative Plan:   Informed Consent: I have reviewed the patients History and Physical, chart, labs and discussed the procedure including the risks, benefits and alternatives for the proposed anesthesia with the patient or authorized representative who has indicated his/her understanding and acceptance.  Dental Advisory Given  Plan Discussed with: Anesthesiologist, CRNA and Surgeon  Anesthesia Plan Comments: (Patient consented for risks of anesthesia including but not limited to:  - adverse reactions to medications - risk of airway placement if required - damage to eyes, teeth, lips or other oral mucosa - nerve damage due to positioning  - sore throat or hoarseness - Damage to heart, brain, nerves, lungs, other parts of body or loss of life  Patient voiced understanding.)        Anesthesia Quick Evaluation

## 2021-07-02 ENCOUNTER — Other Ambulatory Visit: Payer: Self-pay

## 2021-07-02 ENCOUNTER — Encounter: Payer: Self-pay | Admitting: Gastroenterology

## 2021-07-02 DIAGNOSIS — N138 Other obstructive and reflux uropathy: Secondary | ICD-10-CM

## 2021-07-02 DIAGNOSIS — N401 Enlarged prostate with lower urinary tract symptoms: Secondary | ICD-10-CM

## 2021-07-03 ENCOUNTER — Other Ambulatory Visit: Payer: Medicare HMO

## 2021-07-05 ENCOUNTER — Ambulatory Visit: Payer: Medicare HMO | Admitting: Urology

## 2021-07-05 ENCOUNTER — Encounter: Payer: Self-pay | Admitting: Urology

## 2021-07-05 VITALS — BP 184/104 | HR 66 | Ht 68.0 in | Wt 169.0 lb

## 2021-07-05 DIAGNOSIS — N138 Other obstructive and reflux uropathy: Secondary | ICD-10-CM | POA: Diagnosis not present

## 2021-07-05 DIAGNOSIS — R319 Hematuria, unspecified: Secondary | ICD-10-CM

## 2021-07-05 DIAGNOSIS — N401 Enlarged prostate with lower urinary tract symptoms: Secondary | ICD-10-CM

## 2021-07-05 DIAGNOSIS — N529 Male erectile dysfunction, unspecified: Secondary | ICD-10-CM

## 2021-07-06 LAB — PSA: Prostate Specific Ag, Serum: 2.2 ng/mL (ref 0.0–4.0)

## 2021-07-08 ENCOUNTER — Other Ambulatory Visit: Payer: Self-pay | Admitting: Gastroenterology

## 2021-07-10 ENCOUNTER — Inpatient Hospital Stay: Payer: Medicare HMO

## 2021-07-10 ENCOUNTER — Telehealth: Payer: Self-pay | Admitting: Urology

## 2021-07-10 ENCOUNTER — Inpatient Hospital Stay (HOSPITAL_BASED_OUTPATIENT_CLINIC_OR_DEPARTMENT_OTHER): Payer: Medicare HMO | Admitting: Internal Medicine

## 2021-07-10 ENCOUNTER — Encounter: Payer: Self-pay | Admitting: Internal Medicine

## 2021-07-10 VITALS — BP 172/98 | HR 55 | Resp 16

## 2021-07-10 DIAGNOSIS — C786 Secondary malignant neoplasm of retroperitoneum and peritoneum: Secondary | ICD-10-CM | POA: Diagnosis not present

## 2021-07-10 DIAGNOSIS — D509 Iron deficiency anemia, unspecified: Secondary | ICD-10-CM | POA: Diagnosis not present

## 2021-07-10 DIAGNOSIS — N138 Other obstructive and reflux uropathy: Secondary | ICD-10-CM

## 2021-07-10 DIAGNOSIS — C20 Malignant neoplasm of rectum: Secondary | ICD-10-CM | POA: Diagnosis not present

## 2021-07-10 DIAGNOSIS — D696 Thrombocytopenia, unspecified: Secondary | ICD-10-CM | POA: Diagnosis not present

## 2021-07-10 DIAGNOSIS — C184 Malignant neoplasm of transverse colon: Secondary | ICD-10-CM | POA: Diagnosis not present

## 2021-07-10 DIAGNOSIS — N401 Enlarged prostate with lower urinary tract symptoms: Secondary | ICD-10-CM

## 2021-07-10 DIAGNOSIS — Z5111 Encounter for antineoplastic chemotherapy: Secondary | ICD-10-CM | POA: Diagnosis not present

## 2021-07-10 DIAGNOSIS — Z79899 Other long term (current) drug therapy: Secondary | ICD-10-CM | POA: Diagnosis not present

## 2021-07-10 DIAGNOSIS — N529 Male erectile dysfunction, unspecified: Secondary | ICD-10-CM

## 2021-07-10 DIAGNOSIS — C787 Secondary malignant neoplasm of liver and intrahepatic bile duct: Secondary | ICD-10-CM | POA: Diagnosis not present

## 2021-07-10 DIAGNOSIS — N183 Chronic kidney disease, stage 3 unspecified: Secondary | ICD-10-CM | POA: Diagnosis not present

## 2021-07-10 DIAGNOSIS — I129 Hypertensive chronic kidney disease with stage 1 through stage 4 chronic kidney disease, or unspecified chronic kidney disease: Secondary | ICD-10-CM | POA: Diagnosis not present

## 2021-07-10 LAB — CBC WITH DIFFERENTIAL/PLATELET
Abs Immature Granulocytes: 0.01 10*3/uL (ref 0.00–0.07)
Basophils Absolute: 0.1 10*3/uL (ref 0.0–0.1)
Basophils Relative: 1 %
Eosinophils Absolute: 0.5 10*3/uL (ref 0.0–0.5)
Eosinophils Relative: 7 %
HCT: 45.4 % (ref 39.0–52.0)
Hemoglobin: 14.7 g/dL (ref 13.0–17.0)
Immature Granulocytes: 0 %
Lymphocytes Relative: 20 %
Lymphs Abs: 1.5 10*3/uL (ref 0.7–4.0)
MCH: 33.3 pg (ref 26.0–34.0)
MCHC: 32.4 g/dL (ref 30.0–36.0)
MCV: 102.9 fL — ABNORMAL HIGH (ref 80.0–100.0)
Monocytes Absolute: 0.9 10*3/uL (ref 0.1–1.0)
Monocytes Relative: 13 %
Neutro Abs: 4.3 10*3/uL (ref 1.7–7.7)
Neutrophils Relative %: 59 %
Platelets: 84 10*3/uL — ABNORMAL LOW (ref 150–400)
RBC: 4.41 MIL/uL (ref 4.22–5.81)
RDW: 14.8 % (ref 11.5–15.5)
WBC: 7.4 10*3/uL (ref 4.0–10.5)
nRBC: 0 % (ref 0.0–0.2)

## 2021-07-10 LAB — COMPREHENSIVE METABOLIC PANEL
ALT: 33 U/L (ref 0–44)
AST: 37 U/L (ref 15–41)
Albumin: 3.4 g/dL — ABNORMAL LOW (ref 3.5–5.0)
Alkaline Phosphatase: 241 U/L — ABNORMAL HIGH (ref 38–126)
Anion gap: 6 (ref 5–15)
BUN: 15 mg/dL (ref 8–23)
CO2: 24 mmol/L (ref 22–32)
Calcium: 8.3 mg/dL — ABNORMAL LOW (ref 8.9–10.3)
Chloride: 106 mmol/L (ref 98–111)
Creatinine, Ser: 1.73 mg/dL — ABNORMAL HIGH (ref 0.61–1.24)
GFR, Estimated: 40 mL/min — ABNORMAL LOW (ref >60)
Glucose, Bld: 100 mg/dL — ABNORMAL HIGH (ref 70–99)
Potassium: 4.3 mmol/L (ref 3.5–5.1)
Sodium: 136 mmol/L (ref 135–145)
Total Bilirubin: 0.8 mg/dL (ref 0.3–1.2)
Total Protein: 6.2 g/dL — ABNORMAL LOW (ref 6.5–8.1)

## 2021-07-10 LAB — URINALYSIS, COMPLETE (UACMP) WITH MICROSCOPIC
Bilirubin Urine: NEGATIVE
Glucose, UA: NEGATIVE mg/dL
Hgb urine dipstick: NEGATIVE
Ketones, ur: NEGATIVE mg/dL
Leukocytes,Ua: NEGATIVE
Nitrite: NEGATIVE
Protein, ur: NEGATIVE mg/dL
Specific Gravity, Urine: 1.016 (ref 1.005–1.030)
Squamous Epithelial / HPF: NONE SEEN (ref 0–5)
pH: 5 (ref 5.0–8.0)

## 2021-07-10 MED ORDER — SODIUM CHLORIDE 0.9 % IV SOLN
10.0000 mg/kg | INTRAVENOUS | Status: DC
  Administered 2021-07-10: 800 mg via INTRAVENOUS
  Filled 2021-07-10: qty 32

## 2021-07-10 MED ORDER — SODIUM CHLORIDE 0.9% FLUSH
10.0000 mL | INTRAVENOUS | Status: DC | PRN
  Administered 2021-07-10: 10 mL
  Filled 2021-07-10: qty 10

## 2021-07-10 MED ORDER — SODIUM CHLORIDE 0.9 % IV SOLN
2400.0000 mg/m2 | INTRAVENOUS | Status: DC
  Administered 2021-07-10: 4650 mg via INTRAVENOUS
  Filled 2021-07-10: qty 93

## 2021-07-10 MED ORDER — HEPARIN SOD (PORK) LOCK FLUSH 100 UNIT/ML IV SOLN
500.0000 [IU] | Freq: Once | INTRAVENOUS | Status: DC | PRN
  Filled 2021-07-10: qty 5

## 2021-07-10 MED ORDER — SODIUM CHLORIDE 0.9 % IV SOLN
Freq: Once | INTRAVENOUS | Status: AC
  Filled 2021-07-10: qty 250

## 2021-07-10 MED ORDER — SODIUM CHLORIDE 0.9 % IV SOLN
10.0000 mg | Freq: Once | INTRAVENOUS | Status: AC
  Administered 2021-07-10: 10 mg via INTRAVENOUS
  Filled 2021-07-10: qty 10

## 2021-07-10 NOTE — Telephone Encounter (Signed)
Pt LMOM that he would like a refill for, I think he said Cialis.  I couldn't understand his v/m and he tried to spell it, but he said Xiaxlex.

## 2021-07-10 NOTE — Progress Notes (Signed)
CMP and Urine Protein has not resulted for today's treatment. Per Dr. Donneta Romberg, proceed with treatment without CMP results and okay to use Urine Protein from 06/26/21 to proceed with treatment.

## 2021-07-10 NOTE — Telephone Encounter (Signed)
Pt would like a refill for Cialis.

## 2021-07-11 ENCOUNTER — Telehealth: Payer: Self-pay | Admitting: Urology

## 2021-07-11 LAB — CEA: CEA: 3.1 ng/mL (ref 0.0–4.7)

## 2021-07-11 MED ORDER — TADALAFIL 20 MG PO TABS: ORAL_TABLET | ORAL | 3 refills | Status: DC

## 2021-07-11 NOTE — Telephone Encounter (Signed)
Pt left another message this morning, but this time he said something about a referral for a prescription.  Maybe he meant refill?

## 2021-07-11 NOTE — Telephone Encounter (Signed)
Patient is wanting to try Xiaflex? Please advise patient.

## 2021-07-11 NOTE — Telephone Encounter (Signed)
Script refilled

## 2021-07-12 ENCOUNTER — Inpatient Hospital Stay: Payer: Medicare HMO

## 2021-07-12 VITALS — BP 147/97 | HR 66 | Temp 97.6°F | Resp 18

## 2021-07-12 DIAGNOSIS — I129 Hypertensive chronic kidney disease with stage 1 through stage 4 chronic kidney disease, or unspecified chronic kidney disease: Secondary | ICD-10-CM | POA: Diagnosis not present

## 2021-07-12 DIAGNOSIS — C787 Secondary malignant neoplasm of liver and intrahepatic bile duct: Secondary | ICD-10-CM | POA: Diagnosis not present

## 2021-07-12 DIAGNOSIS — D509 Iron deficiency anemia, unspecified: Secondary | ICD-10-CM | POA: Diagnosis not present

## 2021-07-12 DIAGNOSIS — C20 Malignant neoplasm of rectum: Secondary | ICD-10-CM

## 2021-07-12 DIAGNOSIS — C184 Malignant neoplasm of transverse colon: Secondary | ICD-10-CM | POA: Diagnosis not present

## 2021-07-12 DIAGNOSIS — Z5111 Encounter for antineoplastic chemotherapy: Secondary | ICD-10-CM | POA: Diagnosis not present

## 2021-07-12 DIAGNOSIS — Z79899 Other long term (current) drug therapy: Secondary | ICD-10-CM | POA: Diagnosis not present

## 2021-07-12 DIAGNOSIS — N183 Chronic kidney disease, stage 3 unspecified: Secondary | ICD-10-CM | POA: Diagnosis not present

## 2021-07-12 DIAGNOSIS — C786 Secondary malignant neoplasm of retroperitoneum and peritoneum: Secondary | ICD-10-CM | POA: Diagnosis not present

## 2021-07-12 DIAGNOSIS — D696 Thrombocytopenia, unspecified: Secondary | ICD-10-CM | POA: Diagnosis not present

## 2021-07-12 MED ORDER — SODIUM CHLORIDE 0.9% FLUSH
10.0000 mL | INTRAVENOUS | Status: DC | PRN
  Administered 2021-07-12: 10 mL
  Filled 2021-07-12: qty 10

## 2021-07-12 MED ORDER — HEPARIN SOD (PORK) LOCK FLUSH 100 UNIT/ML IV SOLN
500.0000 [IU] | Freq: Once | INTRAVENOUS | Status: AC | PRN
  Administered 2021-07-12: 500 [IU]
  Filled 2021-07-12: qty 5

## 2021-07-12 NOTE — Telephone Encounter (Signed)
Called patient no answer , unable to leave vmail no vmail set up

## 2021-07-18 NOTE — Telephone Encounter (Signed)
Spoke with patient he states that he does not have painful erections but there is a curve to his penis when erect. He would like to discuss treatment therapy for this. Appointment was made

## 2021-07-24 ENCOUNTER — Encounter: Payer: Self-pay | Admitting: Internal Medicine

## 2021-07-24 ENCOUNTER — Inpatient Hospital Stay (HOSPITAL_BASED_OUTPATIENT_CLINIC_OR_DEPARTMENT_OTHER): Payer: Medicare HMO | Admitting: Internal Medicine

## 2021-07-24 ENCOUNTER — Inpatient Hospital Stay: Payer: Medicare HMO | Attending: Internal Medicine

## 2021-07-24 ENCOUNTER — Inpatient Hospital Stay: Payer: Medicare HMO

## 2021-07-24 DIAGNOSIS — Z79899 Other long term (current) drug therapy: Secondary | ICD-10-CM | POA: Diagnosis not present

## 2021-07-24 DIAGNOSIS — C20 Malignant neoplasm of rectum: Secondary | ICD-10-CM

## 2021-07-24 DIAGNOSIS — D696 Thrombocytopenia, unspecified: Secondary | ICD-10-CM | POA: Insufficient documentation

## 2021-07-24 DIAGNOSIS — D509 Iron deficiency anemia, unspecified: Secondary | ICD-10-CM | POA: Diagnosis not present

## 2021-07-24 DIAGNOSIS — N183 Chronic kidney disease, stage 3 unspecified: Secondary | ICD-10-CM | POA: Insufficient documentation

## 2021-07-24 DIAGNOSIS — C786 Secondary malignant neoplasm of retroperitoneum and peritoneum: Secondary | ICD-10-CM | POA: Diagnosis not present

## 2021-07-24 DIAGNOSIS — Z5111 Encounter for antineoplastic chemotherapy: Secondary | ICD-10-CM | POA: Insufficient documentation

## 2021-07-24 DIAGNOSIS — I129 Hypertensive chronic kidney disease with stage 1 through stage 4 chronic kidney disease, or unspecified chronic kidney disease: Secondary | ICD-10-CM | POA: Diagnosis not present

## 2021-07-24 DIAGNOSIS — C787 Secondary malignant neoplasm of liver and intrahepatic bile duct: Secondary | ICD-10-CM | POA: Diagnosis not present

## 2021-07-24 LAB — COMPREHENSIVE METABOLIC PANEL
ALT: 27 U/L (ref 0–44)
AST: 33 U/L (ref 15–41)
Albumin: 3.3 g/dL — ABNORMAL LOW (ref 3.5–5.0)
Alkaline Phosphatase: 228 U/L — ABNORMAL HIGH (ref 38–126)
Anion gap: 8 (ref 5–15)
BUN: 16 mg/dL (ref 8–23)
CO2: 23 mmol/L (ref 22–32)
Calcium: 8.5 mg/dL — ABNORMAL LOW (ref 8.9–10.3)
Chloride: 104 mmol/L (ref 98–111)
Creatinine, Ser: 1.71 mg/dL — ABNORMAL HIGH (ref 0.61–1.24)
GFR, Estimated: 41 mL/min — ABNORMAL LOW (ref >60)
Glucose, Bld: 114 mg/dL — ABNORMAL HIGH (ref 70–99)
Potassium: 4.3 mmol/L (ref 3.5–5.1)
Sodium: 135 mmol/L (ref 135–145)
Total Bilirubin: 1 mg/dL (ref 0.3–1.2)
Total Protein: 6.2 g/dL — ABNORMAL LOW (ref 6.5–8.1)

## 2021-07-24 LAB — CBC WITH DIFFERENTIAL/PLATELET
Abs Immature Granulocytes: 0.02 10*3/uL (ref 0.00–0.07)
Basophils Absolute: 0.1 10*3/uL (ref 0.0–0.1)
Basophils Relative: 1 %
Eosinophils Absolute: 0.6 10*3/uL — ABNORMAL HIGH (ref 0.0–0.5)
Eosinophils Relative: 8 %
HCT: 46 % (ref 39.0–52.0)
Hemoglobin: 15 g/dL (ref 13.0–17.0)
Immature Granulocytes: 0 %
Lymphocytes Relative: 22 %
Lymphs Abs: 1.5 10*3/uL (ref 0.7–4.0)
MCH: 33.4 pg (ref 26.0–34.0)
MCHC: 32.6 g/dL (ref 30.0–36.0)
MCV: 102.4 fL — ABNORMAL HIGH (ref 80.0–100.0)
Monocytes Absolute: 0.8 10*3/uL (ref 0.1–1.0)
Monocytes Relative: 11 %
Neutro Abs: 4 10*3/uL (ref 1.7–7.7)
Neutrophils Relative %: 58 %
Platelets: 95 10*3/uL — ABNORMAL LOW (ref 150–400)
RBC: 4.49 MIL/uL (ref 4.22–5.81)
RDW: 15.2 % (ref 11.5–15.5)
WBC: 7 10*3/uL (ref 4.0–10.5)
nRBC: 0 % (ref 0.0–0.2)

## 2021-07-24 LAB — URINALYSIS, COMPLETE (UACMP) WITH MICROSCOPIC
Bilirubin Urine: NEGATIVE
Glucose, UA: NEGATIVE mg/dL
Hgb urine dipstick: NEGATIVE
Ketones, ur: NEGATIVE mg/dL
Leukocytes,Ua: NEGATIVE
Nitrite: NEGATIVE
Protein, ur: NEGATIVE mg/dL
Specific Gravity, Urine: 1.01 (ref 1.005–1.030)
Squamous Epithelial / HPF: NONE SEEN (ref 0–5)
pH: 5 (ref 5.0–8.0)

## 2021-07-24 MED ORDER — SODIUM CHLORIDE 0.9% FLUSH
10.0000 mL | Freq: Once | INTRAVENOUS | Status: AC
  Administered 2021-07-24: 10 mL via INTRAVENOUS
  Filled 2021-07-24: qty 10

## 2021-07-24 MED ORDER — SODIUM CHLORIDE 0.9 % IV SOLN
10.0000 mg | Freq: Once | INTRAVENOUS | Status: AC
  Administered 2021-07-24: 10 mg via INTRAVENOUS
  Filled 2021-07-24: qty 1

## 2021-07-24 MED ORDER — SODIUM CHLORIDE 0.9 % IV SOLN
10.0000 mg/kg | INTRAVENOUS | Status: DC
  Administered 2021-07-24: 800 mg via INTRAVENOUS
  Filled 2021-07-24: qty 32

## 2021-07-24 MED ORDER — SODIUM CHLORIDE 0.9 % IV SOLN
2400.0000 mg/m2 | INTRAVENOUS | Status: DC
  Administered 2021-07-24: 4650 mg via INTRAVENOUS
  Filled 2021-07-24: qty 93

## 2021-07-24 MED ORDER — SODIUM CHLORIDE 0.9 % IV SOLN
INTRAVENOUS | Status: DC
  Filled 2021-07-24: qty 250

## 2021-07-24 MED ORDER — DEXTROSE 5 % IV SOLN
Freq: Once | INTRAVENOUS | Status: DC
  Filled 2021-07-24: qty 250

## 2021-07-24 NOTE — Progress Notes (Unsigned)
[facility] CONSULT NOTE  Patient Care Team: Eustaquio Boyden, MD as PCP - General (Family Medicine) Benita Gutter, RN as Oncology Nurse Navigator Earna Coder, MD as Consulting Physician (Oncology)  CHIEF COMPLAINTS/PURPOSE OF CONSULTATION: colon/rectal cancer  #  Oncology History Overview Note  # Malignant partially obstructing tumor in the transverse colon/70 cm proximal to the anus- C. COLON MASS, 70 CM; COLD BIOPSY:  - INTRAMUCOSAL ADENOCARCINOMA AT LEAST;  # One 20 mm polyp in the transverse colon, removed with mucosal resection. Resected and retrieved. Clips were placed. Tattooed.  # tumor in the mid rectum and at 10 cm proximal to the anus. Biopsied.      SEE COMMENT.   Comment:  There is no definitive evidence of invasion in this sample.  The  findings may not accurately represent the entire underlying lesion;  clinical correlation is recommended.   D. COLON POLYP, TRANSVERSE; HOT SNARE:  - TUBULOVILLOUS ADENOMA.  - NEGATIVE FOR HIGH GRADE DYSPLASIA AND MALIGNANCY.   E. RECTUM MASS; COLD BIOPSY:  - INVASIVE ADENOCARCINOMA, MODERATELY TO POORLY DIFFERENTIATED. ----------------------------------   # PET scan: SEP 2022-proximal right colon [adjacent mesenteric lymph nodes] and rectal hypermetabolism.  Additional subcentimeter bilateral hypermetabolic lymphadenopathy noted in the retroperitoneal/left external iliac; inferior right lobe of the liver concerning for metastatic disease; right lower quadrant soft tissue mass concerning for peritoneal carcinomatosis; bilateral solid pulmonary nodules ~5 mm; MRI rectum- T stage: T4a; N stage:  N1.   # 09/12-2020- FOLFOX chemo [Dr.White/Dr.Vanga]; ADDED BEV with cycle #3-DC 5-FU bolus+LV; # 6-oxaliplatin dose reduced by 20%[thrombocytopenia]; LAST OX- FEB 20th, 2023- starting cycle #13-will discontinue oxaliplatin [given PN-G-12;/thrombocytopenia]  # MAY  27 th, 2023- CT scan-subcentimeter multiple  lung nodules concerning for for progression of disease.    Rectal cancer (HCC)  09/11/2020 Initial Diagnosis   Rectal cancer (HCC)   09/25/2020 -  Chemotherapy   Patient is on Treatment Plan : COLORECTAL FOLFOX q14d x 4 months     09/28/2020 Cancer Staging   Staging form: Colon and Rectum, AJCC 8th Edition - Clinical: Stage IVC (cT4a, cN1, cM1c) - Signed by Earna Coder, MD on 09/28/2020    Genetic Testing   Negative genetic testing. No pathogenic variants identified on the [facility] Multi-Cancer+RNA Panel. The report date is 11/05/2020.  The Multi-Cancer Panel + RNA offered by [facility] includes sequencing and/or deletion duplication testing of the following 84 genes: AIP, ALK, APC, ATM, AXIN2,BAP1,  BARD1, BLM, BMPR1A, BRCA1, BRCA2, BRIP1, CASR, CDC73, CDH1, CDK4, CDKN1B, CDKN1C, CDKN2A (p14ARF), CDKN2A (p16INK4a), CEBPA, CHEK2, CTNNA1, DICER1, DIS3L2, EGFR (c.2369C>T, p.Thr790Met variant only), EPCAM (Deletion/duplication testing only), FH, FLCN, GATA2, GPC3, GREM1 (Promoter region deletion/duplication testing only), HOXB13 (c.251G>A, p.Gly84Glu), HRAS, KIT, MAX, MEN1, MET, MITF (c.952G>A, p.Glu318Lys variant only), MLH1, MSH2, MSH3, MSH6, MUTYH, NBN, NF1, NF2, NTHL1, PALB2, PDGFRA, PHOX2B, PMS2, POLD1, POLE, POT1, PRKAR1A, PTCH1, PTEN, RAD50, RAD51C, RAD51D, RB1, RECQL4, RET, RUNX1, SDHAF2, SDHA (sequence changes only), SDHB, SDHC, SDHD, SMAD4, SMARCA4, SMARCB1, SMARCE1, STK11, SUFU, TERC, TERT, TMEM127, TP53, TSC1, TSC2, VHL, WRN and WT1.     HISTORY OF PRESENTING ILLNESS: Ambulating independently.  Accompanied by daughter.  Albert Giles 76 y.o.  male synchronous colon cancer/rectal cancer-stage IV-on 5 CIV FU+Avastin is here for follow-up.  Rash on the extremities improved after using hydrocortisone cream.  Denies any itching.  Mild tingling and numbness in extremities and fingertips not any worse.  No falls.   No headaches.  No nosebleeds. No diarrhea.  No constipation.  Complains  of mild fatigue.   Review of Systems  Constitutional:  Negative for chills, diaphoresis, fever and weight loss.  HENT:  Negative for nosebleeds and sore throat.   Eyes:  Negative for double vision.  Respiratory:  Negative for cough, hemoptysis, sputum production, shortness of breath and wheezing.   Cardiovascular:  Negative for chest pain, palpitations, orthopnea and leg swelling.  Gastrointestinal:  Negative for abdominal pain, blood in stool, constipation, diarrhea, heartburn, melena, nausea and vomiting.  Genitourinary:  Negative for dysuria, frequency and urgency.  Skin: Negative.  Negative for itching and rash.  Neurological:  Positive for tingling and sensory change. Negative for dizziness, focal weakness, weakness and headaches.  Endo/Heme/Allergies:  Does not bruise/bleed easily.  Psychiatric/Behavioral:  Negative for depression. The patient is not nervous/anxious and does not have insomnia.      MEDICAL HISTORY:  Past Medical History:  Diagnosis Date   CAD (coronary artery disease) 06/2014   UA with NSTEMI - cath with 99% prox L circ s/p stent, EF 40% ([place], [place] [facility])   Emphysema    mild   Ex-smoker    Family history of breast cancer    Family history of colon cancer    Family history of pancreatic cancer    NSTEMI (non-ST elevated myocardial infarction) ([place]) 07/13/2014    SURGICAL HISTORY: Past Surgical History:  Procedure Laterality Date   CARDIAC CATHETERIZATION N/A 07/14/2014   Left Heart Cath and Coronary Angiography with stent placement;  Surgeon: Teodoro Spray, MD   CARDIAC CATHETERIZATION N/A 07/14/2014   Coronary Stent Intervention;  Surgeon: Yolonda Kida, MD   COLONOSCOPY WITH PROPOFOL N/A 09/06/2020   Procedure: COLONOSCOPY WITH PROPOFOL;  Surgeon: Lin Landsman, MD;  Location: [facility] ENDOSCOPY;  Service: Gastroenterology;  Laterality: N/A;   CYSTECTOMY     on back   ESOPHAGOGASTRODUODENOSCOPY N/A 09/06/2020   Procedure:  ESOPHAGOGASTRODUODENOSCOPY (EGD);  Surgeon: Lin Landsman, MD;  Location: [facility] ENDOSCOPY;  Service: Gastroenterology;  Laterality: N/A;   ESOPHAGOGASTRODUODENOSCOPY N/A 06/29/2021   Procedure: ESOPHAGOGASTRODUODENOSCOPY (EGD);  Surgeon: Lin Landsman, MD;  Location: [facility] ENDOSCOPY;  Service: Gastroenterology;  Laterality: N/A;   INGUINAL HERNIA REPAIR Right 08/17/04   IR IMAGING GUIDED PORT INSERTION  09/13/2020    SOCIAL HISTORY: Social History   Socioeconomic History   Marital status: Married    Spouse name: Not on file   Number of children: Not on file   Years of education: Not on file   Highest education level: Not on file  Occupational History   Not on file  Tobacco Use   Smoking status: Former    Packs/day: 1.00    Years: 35.00    Total pack years: 35.00    Types: Cigarettes    Quit date: 07/07/1998    Years since quitting: 23.0   Smokeless tobacco: Never  Vaping Use   Vaping Use: Never used  Substance and Sexual Activity   Alcohol use: No   Drug use: No   Sexual activity: Yes  Other Topics Concern   Not on file  Social History Narrative   Caffeine: 2 cups coffee, 2 cups tea/day   Lives with wife   Occupation: Higher education careers adviser, retired   Edu: HS   Activity: works in yard, walking   Diet: some water, fruits/vegetables daily   -------------------------------------------------------------------       [place]; [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    Social  Determinants of Health   Financial Resource Strain: Low Risk  (06/16/2020)   Overall Financial Resource Strain (CARDIA)    Difficulty of Paying Living Expenses: Not hard at all  Food Insecurity: No Food Insecurity (06/16/2020)   Hunger Vital Sign    Worried About Running Out of Food in the Last Year: Never true    Ran Out of Food in the Last Year: Never true  Transportation Needs: No Transportation Needs (06/16/2020)   PRAPARE -  Hydrologist (Medical): No    Lack of Transportation (Non-Medical): No  Physical Activity: Inactive (06/16/2020)   Exercise Vital Sign    Days of Exercise per Week: 0 days    Minutes of Exercise per Session: 0 min  Stress: No Stress Concern Present (06/16/2020)   [place]    Feeling of Stress : Not at all  Social Connections: Not on file  Intimate Partner Violence: Not At Risk (06/16/2020)   Humiliation, Afraid, Rape, and Kick questionnaire    Fear of Current or Ex-Partner: No    Emotionally Abused: No    Physically Abused: No    Sexually Abused: No    FAMILY HISTORY: Family History  Problem Relation Age of Onset   Cancer Mother 57       colon   Cancer Sister        breast   Crohn's disease Sister    Cancer Daughter        anal   Crohn's disease Niece    CAD Neg Hx    Stroke Neg Hx    Diabetes Neg Hx    Prostate cancer Neg Hx    Kidney cancer Neg Hx    Bladder Cancer Neg Hx     ALLERGIES:  has No Known Allergies.  MEDICATIONS:  Current Outpatient Medications  Medication Sig Dispense Refill   aspirin EC 81 MG tablet Take 1 tablet (81 mg total) by mouth daily. 60 tablet 1   atorvastatin (LIPITOR) 40 MG tablet Take 1 tablet (40 mg total) by mouth daily at 6 PM. 60 tablet 1   Cholecalciferol (VITAMIN D3) 25 MCG (1000 UT) CAPS Take 1 capsule (1,000 Units total) by mouth daily. 30 capsule    Cyanocobalamin (B-12) 1000 MCG SUBL Place 1 tablet under the tongue daily.     ferrous sulfate 324 (65 Fe) MG TBEC Take 1 tablet (325 mg total) by mouth every other day.     lidocaine-prilocaine (EMLA) cream Apply 1 application topically as needed (to port a cath 1 hour prior to each chemotherapy). 30 g 3   metoprolol tartrate (LOPRESSOR) 25 MG tablet Take 1 tablet (25 mg total) by mouth 2 (two) times daily. 60 tablet 1   ondansetron (ZOFRAN) 8 MG tablet Take 1 tablet (8 mg total) by mouth every 8  (eight) hours as needed for nausea or vomiting. 20 tablet 3   pantoprazole (PROTONIX) 40 MG tablet TAKE 1 TABLET BY MOUTH TWICE DAILY BEFORE A MEAL 60 tablet 11   prochlorperazine (COMPAZINE) 10 MG tablet Take 1 tablet (10 mg total) by mouth every 6 (six) hours as needed for nausea or vomiting. 30 tablet 3   tadalafil (CIALIS) 20 MG tablet TAKE ONE TABLET BY MOUTH DAILY AS NEEDED FOR ERECTILE DYSFUNCTION 20 tablet 3   DULoxetine (CYMBALTA) 30 MG capsule Take 1 capsule (30 mg total) by mouth daily. (Patient not taking: Reported on 07/24/2021) 30 capsule 3  No current facility-administered medications for this visit.   Facility-Administered Medications Ordered in Other Visits  Medication Dose Route Frequency Provider Last Rate Last Admin   heparin lock flush 100 unit/mL  500 Units Intracatheter Once PRN Charlaine Dalton R, MD       sodium chloride flush (NS) 0.9 % injection 10 mL  10 mL Intravenous PRN Charlaine Dalton R, MD   10 mL at 10/09/20 0855   sodium chloride flush (NS) 0.9 % injection 10 mL  10 mL Intracatheter PRN Cammie Sickle, MD       .  PHYSICAL EXAMINATION: ECOG PERFORMANCE STATUS: 0 - Asymptomatic  Vitals:   07/24/21 0900  BP: (!) 159/92  Pulse: (!) 57  Resp: 16  Temp: (!) 96.7 F (35.9 C)     Filed Weights   07/24/21 0900  Weight: 169 lb 6.4 oz (76.8 kg)    Physical Exam Vitals and nursing note reviewed.  HENT:     Head: Normocephalic and atraumatic.     Mouth/Throat:     Pharynx: Oropharynx is clear.  Eyes:     Extraocular Movements: Extraocular movements intact.     Pupils: Pupils are equal, round, and reactive to light.  Cardiovascular:     Rate and Rhythm: Normal rate and regular rhythm.  Pulmonary:     Comments: Decreased breath sounds bilaterally.  Abdominal:     Palpations: Abdomen is soft.  Musculoskeletal:        General: Normal range of motion.     Cervical back: Normal range of motion.  Skin:    General: Skin is warm.      Findings: Bruising present.  Neurological:     General: No focal deficit present.     Mental Status: He is alert and oriented to person, place, and time.  Psychiatric:        Behavior: Behavior normal.        Judgment: Judgment normal.    LABORATORY DATA:  I have reviewed the data as listed Lab Results  Component Value Date   WBC 7.0 07/24/2021   HGB 15.0 07/24/2021   HCT 46.0 07/24/2021   MCV 102.4 (H) 07/24/2021   PLT 95 (L) 07/24/2021   Recent Labs    06/26/21 0836 07/10/21 0901 07/24/21 0833  NA 136 136 135  K 4.2 4.3 4.3  CL 107 106 104  CO2 _0 GLUCOSE 99 100* 114*  BUN _1 CREATININE 1.71* 1.73* 1.71*  CALCIUM 8.6* 8.3* 8.5*  GFRNONAA 41* 40* 41*  PROT 6.4* 6.2* 6.2*  ALBUMIN 3.3* 3.4* 3.3*  AST 33 37 33  ALT 30 33 27  ALKPHOS 252* 241* 228*  BILITOT 0.8 0.8 1.0  No results found for: "CEA"   RADIOGRAPHIC STUDIES: I have personally reviewed the radiological images as listed and agreed with the findings in the report. No results found.  ASSESSMENT & PLAN:   Rectal cancer ([place])  # STAGE IV- Synchronous primaries a] rectal cancer-10 cm-adenocarcinoma & b] transverse colon-- intramucosal adenoca]; abdominal lymphadenopathy; liver metastases; omental metastases. -Currently on 5-FU plus Bev- CT MAY 26th, 2023-  Interval development of multiple basilar predominant tiny bilateral pulmonary nodules, all new since previous chest CT of 12/27/2020 and abdomen CT of 03/15/2021- concerning for metastatic disease.  Wall thickening in the ascending colon, at and just distal to the level of the ileocecal valve with soft tissue stranding in the pericolonic fat of this region, similar to prior. Circumferential wall thickening  again noted in the rectum.; Omental Nodule-STABLE [10x50m].  Given the subcentimeter nature of the nodules recommend monitoring for now.  Continue chemotherapy as planned. STABLE.     # proceed with 5FU+Avastin maintenance continue BEV [see  below]  Labs today reviewed;  acceptable for treatment today. Will repeat scans in AUG 2023.   # Anemia/iron deficiency hemoglobin ~13-  secondary to chronic GI bleeding/tumor-STABLE  # Thrombocytopenia/secondary chemotherapy/on aspirin-platelets- 80-90-monitor closely- STABLE.  # HTN- 180/102; at home BP reviewed- 130-150/ DBP80-90s.continue checking BP at home/reviewed the log from home.  Proceed with avastin today.STABLE.  # PN G-2-3 sec to Oxaliplatin [last 04/02/2021]. discontinued oxaliplatin for now. Poor tolerance to cymbalta 30 mg q day. STABLE.  # Chronic kidney disease stage III-[GFR-40 ] -stable.  Continue keeping up with hydration.  # Skin rash: Bil LE- ? Allergic- improved- continue Hydrocortisone prn- STABLE.   * medical records- o  * CEA- not marker-  # DISPOSITION:   # 5FU+Bev today;  # Follow up in 2 weeks- NP: labs- cbc/cmp;UA   5FU+ bev; Pump off on D-3  # Follow up in 4 weeks- MD: labs- cbc/cmp;UA   5FU+ bev; Pump off on D-3; CT CAP; Dr.B  Rectal cancer (Lourdes Sledge MD 07/25/2021   CC: Dr. BRogue Bussing

## 2021-07-24 NOTE — Progress Notes (Unsigned)
Patient denies new problems/concerns today.   °

## 2021-07-24 NOTE — Patient Instructions (Signed)
[facility] AT [place]  Discharge Instructions: Thank you for choosing [facility] to provide your oncology and hematology care.  If you have a lab appointment with the [facility], please go directly to the [facility] and check in at the registration area.  Wear comfortable clothing and clothing appropriate for easy access to any Portacath or PICC line.   We strive to give you quality time with your provider. You may need to reschedule your appointment if you arrive late (15 or more minutes).  Arriving late affects you and other patients whose appointments are after yours.  Also, if you miss three or more appointments without notifying the office, you may be dismissed from the clinic at the provider's discretion.      For prescription refill requests, have your pharmacy contact our office and allow 72 hours for refills to be completed.    Today you received the following chemotherapy and/or immunotherapy agents Adrucil and Mvasi.      To help prevent nausea and vomiting after your treatment, we encourage you to take your nausea medication as directed.  BELOW ARE SYMPTOMS THAT SHOULD BE REPORTED IMMEDIATELY: *FEVER GREATER THAN 100.4 F (38 C) OR HIGHER *CHILLS OR SWEATING *NAUSEA AND VOMITING THAT IS NOT CONTROLLED WITH YOUR NAUSEA MEDICATION *UNUSUAL SHORTNESS OF BREATH *UNUSUAL BRUISING OR BLEEDING *URINARY PROBLEMS (pain or burning when urinating, or frequent urination) *BOWEL PROBLEMS (unusual diarrhea, constipation, pain near the anus) TENDERNESS IN MOUTH AND THROAT WITH OR WITHOUT PRESENCE OF ULCERS (sore throat, sores in mouth, or a toothache) UNUSUAL RASH, SWELLING OR PAIN  UNUSUAL VAGINAL DISCHARGE OR ITCHING   Items with * indicate a potential emergency and should be followed up as soon as possible or go to the Emergency Department if any problems should occur.  Please show the CHEMOTHERAPY ALERT CARD or IMMUNOTHERAPY ALERT CARD at  check-in to the Emergency Department and triage nurse.  Should you have questions after your visit or need to cancel or reschedule your appointment, please contact [facility] [place] AT [place]  [phone] and follow the prompts.  Office hours are 8:00 a.m. to 4:30 p.m. Monday - Friday. Please note that voicemails left after 4:00 p.m. may not be returned until the following business day.  We are closed weekends and major holidays. You have access to a nurse at all times for urgent questions. Please call the main number to the clinic [phone] and follow the prompts.  For any non-urgent questions, you may also contact your provider using MyChart. We now offer e-Visits for anyone 37 and older to request care online for non-urgent symptoms. For details visit [email or web address].   Also download the MyChart app! Go to the app store, search "MyChart", open the app, select [facility], and log in with your MyChart username and password.  Masks are optional in the cancer centers. If you would like for your care team to wear a mask while they are taking care of you, please let them know. For doctor visits, patients may have with them one support person who is at least 78 years old. At this time, visitors are not allowed in the infusion area.

## 2021-07-24 NOTE — Assessment & Plan Note (Addendum)
#   STAGE IV- Synchronous primaries a] rectal cancer-10 cm-adenocarcinoma & b] transverse colon-- intramucosal adenoca]; abdominal lymphadenopathy; liver metastases; omental metastases. -Currently on 5-FU plus Bev- CT MAY 26th, 2023-  Interval development of multiple basilar predominant tiny bilateral pulmonary nodules, all new since previous chest CT of 12/27/2020 and abdomen CT of 03/15/2021- concerning for metastatic disease.  Wall thickening in the ascending colon, at and just distal to the level of the ileocecal valve with soft tissue stranding in the pericolonic fat of this region, similar to prior. Circumferential wall thickening again noted in the rectum.; Omental Nodule-STABLE [10x49m].  Given the subcentimeter nature of the nodules recommend monitoring for now.  Continue chemotherapy as planned. STABLE.   # proceed with 5FU+Avastin maintenance continue BEV [see below]  Labs today reviewed;  acceptable for treatment today. Will repeat scans in AUG 2023.   # Anemia/iron deficiency hemoglobin ~13-  secondary to chronic GI bleeding/tumor-STABLE  # Thrombocytopenia/secondary chemotherapy/on aspirin-platelets- 80-90-monitor closely- STABLE.  # HTN- 180/102; at home BP reviewed- 130-150/ DBP80-90s.continue checking BP at home/reviewed the log from home.  Proceed with avastin today.STABLE.  # PN G-2-3 sec to Oxaliplatin [last 04/02/2021]. discontinued oxaliplatin for now. Poor tolerance to cymbalta 30 mg q day. STABLE.  # Chronic kidney disease stage III-[GFR-40 ] -stable.  Continue keeping up with hydration.  # Skin rash: Bil LE- ? Allergic- improved- continue Hydrocortisone prn- STABLE.   * medical records- o * CEA- not marker-  # DISPOSITION:   # 5FU+Bev today;  # Follow up in 2 weeks- NP: labs- cbc/cmp;UA   5FU+ bev; Pump off on D-3  # Follow up in 4 weeks- MD: labs- cbc/cmp;UA   5FU+ bev; Pump off on D-3; CT CAP; Dr.B

## 2021-07-25 ENCOUNTER — Encounter: Payer: Self-pay | Admitting: Internal Medicine

## 2021-07-26 ENCOUNTER — Inpatient Hospital Stay: Payer: Medicare HMO

## 2021-07-26 VITALS — BP 136/79 | HR 68 | Resp 18

## 2021-07-26 DIAGNOSIS — I129 Hypertensive chronic kidney disease with stage 1 through stage 4 chronic kidney disease, or unspecified chronic kidney disease: Secondary | ICD-10-CM | POA: Diagnosis not present

## 2021-07-26 DIAGNOSIS — Z79899 Other long term (current) drug therapy: Secondary | ICD-10-CM | POA: Diagnosis not present

## 2021-07-26 DIAGNOSIS — C20 Malignant neoplasm of rectum: Secondary | ICD-10-CM | POA: Diagnosis not present

## 2021-07-26 DIAGNOSIS — C786 Secondary malignant neoplasm of retroperitoneum and peritoneum: Secondary | ICD-10-CM | POA: Diagnosis not present

## 2021-07-26 DIAGNOSIS — D509 Iron deficiency anemia, unspecified: Secondary | ICD-10-CM | POA: Diagnosis not present

## 2021-07-26 DIAGNOSIS — C787 Secondary malignant neoplasm of liver and intrahepatic bile duct: Secondary | ICD-10-CM | POA: Diagnosis not present

## 2021-07-26 DIAGNOSIS — D696 Thrombocytopenia, unspecified: Secondary | ICD-10-CM | POA: Diagnosis not present

## 2021-07-26 DIAGNOSIS — N183 Chronic kidney disease, stage 3 unspecified: Secondary | ICD-10-CM | POA: Diagnosis not present

## 2021-07-26 DIAGNOSIS — Z5111 Encounter for antineoplastic chemotherapy: Secondary | ICD-10-CM | POA: Diagnosis not present

## 2021-07-26 MED ORDER — HEPARIN SOD (PORK) LOCK FLUSH 100 UNIT/ML IV SOLN
500.0000 [IU] | Freq: Once | INTRAVENOUS | Status: AC | PRN
  Administered 2021-07-26: 500 [IU]
  Filled 2021-07-26: qty 5

## 2021-07-26 MED ORDER — SODIUM CHLORIDE 0.9% FLUSH
10.0000 mL | INTRAVENOUS | Status: DC | PRN
  Administered 2021-07-26: 10 mL
  Filled 2021-07-26: qty 10

## 2021-08-06 ENCOUNTER — Other Ambulatory Visit: Payer: Self-pay

## 2021-08-07 ENCOUNTER — Inpatient Hospital Stay: Payer: Medicare HMO

## 2021-08-07 ENCOUNTER — Ambulatory Visit: Payer: Medicare HMO | Admitting: Urology

## 2021-08-07 ENCOUNTER — Inpatient Hospital Stay (HOSPITAL_BASED_OUTPATIENT_CLINIC_OR_DEPARTMENT_OTHER): Payer: Medicare HMO | Admitting: Nurse Practitioner

## 2021-08-07 VITALS — BP 152/89 | HR 58 | Temp 96.5°F | Resp 16 | Wt 171.0 lb

## 2021-08-07 DIAGNOSIS — C20 Malignant neoplasm of rectum: Secondary | ICD-10-CM | POA: Diagnosis not present

## 2021-08-07 DIAGNOSIS — Z79899 Other long term (current) drug therapy: Secondary | ICD-10-CM | POA: Diagnosis not present

## 2021-08-07 DIAGNOSIS — T451X5A Adverse effect of antineoplastic and immunosuppressive drugs, initial encounter: Secondary | ICD-10-CM | POA: Diagnosis not present

## 2021-08-07 DIAGNOSIS — G620 Drug-induced polyneuropathy: Secondary | ICD-10-CM

## 2021-08-07 DIAGNOSIS — C786 Secondary malignant neoplasm of retroperitoneum and peritoneum: Secondary | ICD-10-CM | POA: Diagnosis not present

## 2021-08-07 DIAGNOSIS — D696 Thrombocytopenia, unspecified: Secondary | ICD-10-CM | POA: Diagnosis not present

## 2021-08-07 DIAGNOSIS — D509 Iron deficiency anemia, unspecified: Secondary | ICD-10-CM | POA: Diagnosis not present

## 2021-08-07 DIAGNOSIS — Z5111 Encounter for antineoplastic chemotherapy: Secondary | ICD-10-CM | POA: Diagnosis not present

## 2021-08-07 DIAGNOSIS — D6959 Other secondary thrombocytopenia: Secondary | ICD-10-CM

## 2021-08-07 DIAGNOSIS — N183 Chronic kidney disease, stage 3 unspecified: Secondary | ICD-10-CM | POA: Diagnosis not present

## 2021-08-07 DIAGNOSIS — I129 Hypertensive chronic kidney disease with stage 1 through stage 4 chronic kidney disease, or unspecified chronic kidney disease: Secondary | ICD-10-CM | POA: Diagnosis not present

## 2021-08-07 DIAGNOSIS — C787 Secondary malignant neoplasm of liver and intrahepatic bile duct: Secondary | ICD-10-CM | POA: Diagnosis not present

## 2021-08-07 LAB — CBC WITH DIFFERENTIAL/PLATELET
Abs Immature Granulocytes: 0.02 K/uL (ref 0.00–0.07)
Basophils Absolute: 0.1 K/uL (ref 0.0–0.1)
Basophils Relative: 1 %
Eosinophils Absolute: 0.5 K/uL (ref 0.0–0.5)
Eosinophils Relative: 7 %
HCT: 46.3 % (ref 39.0–52.0)
Hemoglobin: 15.1 g/dL (ref 13.0–17.0)
Immature Granulocytes: 0 %
Lymphocytes Relative: 23 %
Lymphs Abs: 1.5 K/uL (ref 0.7–4.0)
MCH: 33.6 pg (ref 26.0–34.0)
MCHC: 32.6 g/dL (ref 30.0–36.0)
MCV: 103.1 fL — ABNORMAL HIGH (ref 80.0–100.0)
Monocytes Absolute: 0.6 K/uL (ref 0.1–1.0)
Monocytes Relative: 10 %
Neutro Abs: 3.8 K/uL (ref 1.7–7.7)
Neutrophils Relative %: 59 %
Platelets: 93 K/uL — ABNORMAL LOW (ref 150–400)
RBC: 4.49 MIL/uL (ref 4.22–5.81)
RDW: 15.6 % — ABNORMAL HIGH (ref 11.5–15.5)
WBC: 6.5 K/uL (ref 4.0–10.5)
nRBC: 0 % (ref 0.0–0.2)

## 2021-08-07 LAB — COMPREHENSIVE METABOLIC PANEL WITH GFR
ALT: 30 U/L (ref 0–44)
AST: 38 U/L (ref 15–41)
Albumin: 3.3 g/dL — ABNORMAL LOW (ref 3.5–5.0)
Alkaline Phosphatase: 216 U/L — ABNORMAL HIGH (ref 38–126)
Anion gap: 3 — ABNORMAL LOW (ref 5–15)
BUN: 19 mg/dL (ref 8–23)
CO2: 23 mmol/L (ref 22–32)
Calcium: 8.4 mg/dL — ABNORMAL LOW (ref 8.9–10.3)
Chloride: 110 mmol/L (ref 98–111)
Creatinine, Ser: 1.71 mg/dL — ABNORMAL HIGH (ref 0.61–1.24)
GFR, Estimated: 40 mL/min — ABNORMAL LOW
Glucose, Bld: 130 mg/dL — ABNORMAL HIGH (ref 70–99)
Potassium: 4.1 mmol/L (ref 3.5–5.1)
Sodium: 136 mmol/L (ref 135–145)
Total Bilirubin: 1 mg/dL (ref 0.3–1.2)
Total Protein: 6.5 g/dL (ref 6.5–8.1)

## 2021-08-07 LAB — URINALYSIS, COMPLETE (UACMP) WITH MICROSCOPIC
Bilirubin Urine: NEGATIVE
Glucose, UA: NEGATIVE mg/dL
Hgb urine dipstick: NEGATIVE
Ketones, ur: NEGATIVE mg/dL
Leukocytes,Ua: NEGATIVE
Nitrite: NEGATIVE
Protein, ur: NEGATIVE mg/dL
Specific Gravity, Urine: 1.016 (ref 1.005–1.030)
Squamous Epithelial / HPF: NONE SEEN (ref 0–5)
pH: 5 (ref 5.0–8.0)

## 2021-08-07 MED ORDER — SODIUM CHLORIDE 0.9 % IV SOLN
2400.0000 mg/m2 | INTRAVENOUS | Status: DC
  Administered 2021-08-07: 4650 mg via INTRAVENOUS
  Filled 2021-08-07: qty 93

## 2021-08-07 MED ORDER — SODIUM CHLORIDE 0.9 % IV SOLN
10.0000 mg | Freq: Once | INTRAVENOUS | Status: AC
  Administered 2021-08-07: 10 mg via INTRAVENOUS
  Filled 2021-08-07: qty 1

## 2021-08-07 MED ORDER — SODIUM CHLORIDE 0.9 % IV SOLN
INTRAVENOUS | Status: DC
  Filled 2021-08-07: qty 250

## 2021-08-07 MED ORDER — SODIUM CHLORIDE 0.9 % IV SOLN
10.0000 mg/kg | INTRAVENOUS | Status: DC
  Administered 2021-08-07: 800 mg via INTRAVENOUS
  Filled 2021-08-07: qty 32

## 2021-08-07 MED ORDER — SODIUM CHLORIDE 0.9% FLUSH
10.0000 mL | INTRAVENOUS | Status: DC | PRN
  Administered 2021-08-07: 10 mL
  Filled 2021-08-07: qty 10

## 2021-08-07 NOTE — Patient Instructions (Signed)
As we discussed, you may notice some improvement in your symptoms with acupuncture. Below is the names of some local clinics that offer our cancer survivors a discounted rate of $40/session. You can contact them for an appointment at your convenience.  - [facility]) or [phone] [facility])  It was a pleasure meeting you today and thank you for allowing me to participate in your care. -Beckey Rutter, NP

## 2021-08-07 NOTE — Progress Notes (Signed)
Creatinine 1.71. Per Benjie Karvonen RN per Beckey Rutter NP okay to proceed with treatment

## 2021-08-07 NOTE — Progress Notes (Signed)
Pt returns for follow-up prior to chemotherapy today. He reports some numbness and tingling in his feet and hands, otherwise, he states that he feels well.

## 2021-08-07 NOTE — Progress Notes (Signed)
[place] NOTE  Patient Care Team: [name], MD as PCP - General (Family Medicine) Clent Jacks, RN as Oncology Nurse Navigator Cammie Sickle, MD as Consulting Physician (Oncology)  CHIEF COMPLAINTS/PURPOSE OF CONSULTATION: colon/rectal cancer  #  Oncology History Overview Note  # Malignant partially obstructing tumor in the transverse colon/70 cm proximal to the anus- C. COLON MASS, 70 CM; COLD BIOPSY:  - INTRAMUCOSAL ADENOCARCINOMA AT LEAST;  # One 20 mm polyp in the transverse colon, removed with mucosal resection. Resected and retrieved. Clips were placed. Tattooed.  # tumor in the mid rectum and at 10 cm proximal to the anus. Biopsied.      SEE COMMENT.   Comment:  There is no definitive evidence of invasion in this sample.  The  findings may not accurately represent the entire underlying lesion;  clinical correlation is recommended.   D. COLON POLYP, TRANSVERSE; HOT SNARE:  - TUBULOVILLOUS ADENOMA.  - NEGATIVE FOR HIGH GRADE DYSPLASIA AND MALIGNANCY.   E. RECTUM MASS; COLD BIOPSY:  - INVASIVE ADENOCARCINOMA, MODERATELY TO POORLY DIFFERENTIATED. ----------------------------------   # PET scan: SEP 2022-proximal right colon [adjacent mesenteric lymph nodes] and rectal hypermetabolism.  Additional subcentimeter bilateral hypermetabolic lymphadenopathy noted in the retroperitoneal/left external iliac; inferior right lobe of the liver concerning for metastatic disease; right lower quadrant soft tissue mass concerning for peritoneal carcinomatosis; bilateral solid pulmonary nodules ~5 mm; MRI rectum- T stage: T4a; N stage:  N1.   # 09/12-2020- FOLFOX chemo [Dr.White/Dr.Vanga]; ADDED BEV with cycle #3-DC 5-FU bolus+LV; # 6-oxaliplatin dose reduced by 20%[thrombocytopenia]; LAST OX- FEB 20th, 2023- starting cycle #13-will discontinue oxaliplatin [given PN-G-12;/thrombocytopenia]  # MAY  27 th, 2023- CT scan-subcentimeter multiple  lung nodules concerning for for progression of disease.    Rectal cancer ([place])  09/11/2020 Initial Diagnosis   Rectal cancer ([place])   09/25/2020 -  Chemotherapy   Patient is on Treatment Plan : COLORECTAL FOLFOX q14d x 4 months     09/28/2020 Cancer Staging   Staging form: Colon and Rectum, AJCC 8th Edition - Clinical: Stage IVC (cT4a, cN1, cM1c) - Signed by Cammie Sickle, MD on 09/28/2020    Genetic Testing   Negative genetic testing. No pathogenic variants identified on the [facility] Multi-Cancer+RNA Panel. The report date is 11/05/2020.  The Multi-Cancer Panel + RNA offered by [facility] includes sequencing and/or deletion duplication testing of the following 84 genes: AIP, ALK, APC, ATM, AXIN2,BAP1,  BARD1, BLM, BMPR1A, BRCA1, BRCA2, BRIP1, CASR, CDC73, CDH1, CDK4, CDKN1B, CDKN1C, CDKN2A (p14ARF), CDKN2A (p16INK4a), CEBPA, CHEK2, CTNNA1, DICER1, DIS3L2, EGFR (c.2369C>T, p.Thr790Met variant only), EPCAM (Deletion/duplication testing only), FH, FLCN, GATA2, GPC3, GREM1 (Promoter region deletion/duplication testing only), HOXB13 (c.251G>A, p.Gly84Glu), HRAS, KIT, MAX, MEN1, MET, MITF (c.952G>A, p.Glu318Lys variant only), MLH1, MSH2, MSH3, MSH6, MUTYH, NBN, NF1, NF2, NTHL1, PALB2, PDGFRA, PHOX2B, PMS2, POLD1, POLE, POT1, PRKAR1A, PTCH1, PTEN, RAD50, RAD51C, RAD51D, RB1, RECQL4, RET, RUNX1, SDHAF2, SDHA (sequence changes only), SDHB, SDHC, SDHD, SMAD4, SMARCA4, SMARCB1, SMARCE1, STK11, SUFU, TERC, TERT, TMEM127, TP53, TSC1, TSC2, VHL, WRN and WT1.     HISTORY OF PRESENTING ILLNESS: Ambulating independently.  Accompanied by daughter.  Albert Giles 76 y.o.  male synchronous colon cancer/rectal cancer-stage IV-on 5 CIV FU+Avastin is here for follow-up and consideration of cycle 21 of     Rash on the extremities improved after using hydrocortisone cream.  Denies any itching.  Mild tingling and numbness in extremities and fingertips not any worse.  No falls.   No headaches.  No nosebleeds. No  diarrhea.  No constipation.  Complains of mild fatigue.   Review of Systems  Constitutional:  Negative for chills, diaphoresis, fever and weight loss.  HENT:  Negative for nosebleeds and sore throat.   Eyes:  Negative for double vision.  Respiratory:  Negative for cough, hemoptysis, sputum production, shortness of breath and wheezing.   Cardiovascular:  Negative for chest pain, palpitations, orthopnea and leg swelling.  Gastrointestinal:  Negative for abdominal pain, blood in stool, constipation, diarrhea, heartburn, melena, nausea and vomiting.  Genitourinary:  Negative for dysuria, frequency and urgency.  Skin: Negative.  Negative for itching and rash.  Neurological:  Positive for tingling and sensory change. Negative for dizziness, focal weakness, weakness and headaches.  Endo/Heme/Allergies:  Does not bruise/bleed easily.  Psychiatric/Behavioral:  Negative for depression. The patient is not nervous/anxious and does not have insomnia.      MEDICAL HISTORY:  Past Medical History:  Diagnosis Date   CAD (coronary artery disease) 06/2014   UA with NSTEMI - cath with 99% prox L circ s/p stent, EF 40% ([place], [place] [facility])   Emphysema    mild   Ex-smoker    Family history of breast cancer    Family history of colon cancer    Family history of pancreatic cancer    NSTEMI (non-ST elevated myocardial infarction) ([place]) 07/13/2014    SURGICAL HISTORY: Past Surgical History:  Procedure Laterality Date   CARDIAC CATHETERIZATION N/A 07/14/2014   Left Heart Cath and Coronary Angiography with stent placement;  Surgeon: Teodoro Spray, MD   CARDIAC CATHETERIZATION N/A 07/14/2014   Coronary Stent Intervention;  Surgeon: Yolonda Kida, MD   COLONOSCOPY WITH PROPOFOL N/A 09/06/2020   Procedure: COLONOSCOPY WITH PROPOFOL;  Surgeon: Lin Landsman, MD;  Location: [facility] ENDOSCOPY;  Service: Gastroenterology;  Laterality: N/A;   CYSTECTOMY     on back   ESOPHAGOGASTRODUODENOSCOPY N/A  09/06/2020   Procedure: ESOPHAGOGASTRODUODENOSCOPY (EGD);  Surgeon: Lin Landsman, MD;  Location: [facility] ENDOSCOPY;  Service: Gastroenterology;  Laterality: N/A;   ESOPHAGOGASTRODUODENOSCOPY N/A 06/29/2021   Procedure: ESOPHAGOGASTRODUODENOSCOPY (EGD);  Surgeon: Lin Landsman, MD;  Location: [facility] ENDOSCOPY;  Service: Gastroenterology;  Laterality: N/A;   INGUINAL HERNIA REPAIR Right 08/17/04   IR IMAGING GUIDED PORT INSERTION  09/13/2020    SOCIAL HISTORY: Social History   Socioeconomic History   Marital status: Married    Spouse name: Not on file   Number of children: Not on file   Years of education: Not on file   Highest education level: Not on file  Occupational History   Not on file  Tobacco Use   Smoking status: Former    Packs/day: 1.00    Years: 35.00    Total pack years: 35.00    Types: Cigarettes    Quit date: 07/07/1998    Years since quitting: 23.1   Smokeless tobacco: Never  Vaping Use   Vaping Use: Never used  Substance and Sexual Activity   Alcohol use: No   Drug use: No   Sexual activity: Yes  Other Topics Concern   Not on file  Social History Narrative   Caffeine: 2 cups coffee, 2 cups tea/day   Lives with wife   Occupation: Higher education careers adviser, retired   Edu: HS   Activity: works in yard, walking   Diet: some water, fruits/vegetables daily   -------------------------------------------------------------------       [employer] [place]; [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 Quit smoking 20 years ago; no alcohol; with  wife; daughter- next door.    Social Determinants of Health   Financial Resource Strain: Low Risk  (06/16/2020)   Overall Financial Resource Strain (CARDIA)    Difficulty of Paying Living Expenses: Not hard at all  Food Insecurity: No Food Insecurity (06/16/2020)   Hunger Vital Sign    Worried About Running Out of Food in the Last Year: Never true    Ran Out of Food in the Last Year: Never true  Transportation Needs: No Transportation Needs  (06/16/2020)   PRAPARE - Hydrologist (Medical): No    Lack of Transportation (Non-Medical): No  Physical Activity: Inactive (06/16/2020)   Exercise Vital Sign    Days of Exercise per Week: 0 days    Minutes of Exercise per Session: 0 min  Stress: No Stress Concern Present (06/16/2020)   [place]    Feeling of Stress : Not at all  Social Connections: Not on file  Intimate Partner Violence: Not At Risk (06/16/2020)   Humiliation, Afraid, Rape, and Kick questionnaire    Fear of Current or Ex-Partner: No    Emotionally Abused: No    Physically Abused: No    Sexually Abused: No    FAMILY HISTORY: Family History  Problem Relation Age of Onset   Cancer Mother 56       colon   Cancer Sister        breast   Crohn's disease Sister    Cancer Daughter        anal   Crohn's disease Niece    CAD Neg Hx    Stroke Neg Hx    Diabetes Neg Hx    Prostate cancer Neg Hx    Kidney cancer Neg Hx    Bladder Cancer Neg Hx     ALLERGIES:  has No Known Allergies.  MEDICATIONS:  Current Outpatient Medications  Medication Sig Dispense Refill   aspirin EC 81 MG tablet Take 1 tablet (81 mg total) by mouth daily. 60 tablet 1   atorvastatin (LIPITOR) 40 MG tablet Take 1 tablet (40 mg total) by mouth daily at 6 PM. 60 tablet 1   Cholecalciferol (VITAMIN D3) 25 MCG (1000 UT) CAPS Take 1 capsule (1,000 Units total) by mouth daily. 30 capsule    Cyanocobalamin (B-12) 1000 MCG SUBL Place 1 tablet under the tongue daily.     DULoxetine (CYMBALTA) 30 MG capsule Take 1 capsule (30 mg total) by mouth daily. (Patient not taking: Reported on 07/24/2021) 30 capsule 3   ferrous sulfate 324 (65 Fe) MG TBEC Take 1 tablet (325 mg total) by mouth every other day.     lidocaine-prilocaine (EMLA) cream Apply 1 application topically as needed (to port a cath 1 hour prior to each chemotherapy). 30 g 3   metoprolol tartrate  (LOPRESSOR) 25 MG tablet Take 1 tablet (25 mg total) by mouth 2 (two) times daily. 60 tablet 1   ondansetron (ZOFRAN) 8 MG tablet Take 1 tablet (8 mg total) by mouth every 8 (eight) hours as needed for nausea or vomiting. 20 tablet 3   pantoprazole (PROTONIX) 40 MG tablet TAKE 1 TABLET BY MOUTH TWICE DAILY BEFORE A MEAL 60 tablet 11   prochlorperazine (COMPAZINE) 10 MG tablet Take 1 tablet (10 mg total) by mouth every 6 (six) hours as needed for nausea or vomiting. 30 tablet 3   tadalafil (CIALIS) 20 MG tablet TAKE ONE TABLET BY MOUTH DAILY AS NEEDED  FOR ERECTILE DYSFUNCTION 20 tablet 3   No current facility-administered medications for this visit.   Facility-Administered Medications Ordered in Other Visits  Medication Dose Route Frequency Provider Last Rate Last Admin   heparin lock flush 100 unit/mL  500 Units Intracatheter Once PRN Charlaine Dalton R, MD       sodium chloride flush (NS) 0.9 % injection 10 mL  10 mL Intravenous PRN Charlaine Dalton R, MD   10 mL at 10/09/20 0855   sodium chloride flush (NS) 0.9 % injection 10 mL  10 mL Intracatheter PRN Cammie Sickle, MD       .  PHYSICAL EXAMINATION: ECOG PERFORMANCE STATUS: 0 - Asymptomatic  Vitals:   08/07/21 0842  BP: (!) 152/89  Pulse: (!) 58  Resp: 16  Temp: (!) 96.5 F (35.8 C)  SpO2: 97%     Filed Weights   08/07/21 0839  Weight: 171 lb (77.6 kg)    Physical Exam Vitals and nursing note reviewed.  HENT:     Head: Normocephalic and atraumatic.     Mouth/Throat:     Pharynx: Oropharynx is clear.  Eyes:     Extraocular Movements: Extraocular movements intact.     Pupils: Pupils are equal, round, and reactive to light.  Cardiovascular:     Rate and Rhythm: Normal rate and regular rhythm.  Pulmonary:     Comments: Decreased breath sounds bilaterally.  Abdominal:     Palpations: Abdomen is soft.  Musculoskeletal:        General: Normal range of motion.     Cervical back: Normal range of motion.   Skin:    General: Skin is warm.     Findings: Bruising present.  Neurological:     General: No focal deficit present.     Mental Status: He is alert and oriented to person, place, and time.  Psychiatric:        Behavior: Behavior normal.        Judgment: Judgment normal.    LABORATORY DATA:  I have reviewed the data as listed Lab Results  Component Value Date   WBC 6.5 08/07/2021   HGB 15.1 08/07/2021   HCT 46.3 08/07/2021   MCV 103.1 (H) 08/07/2021   PLT 93 (L) 08/07/2021   Recent Labs    06/26/21 0836 07/10/21 0901 07/24/21 0833  NA 136 136 135  K 4.2 4.3 4.3  CL 107 106 104  CO2 _0 GLUCOSE 99 100* 114*  BUN _1 CREATININE 1.71* 1.73* 1.71*  CALCIUM 8.6* 8.3* 8.5*  GFRNONAA 41* 40* 41*  PROT 6.4* 6.2* 6.2*  ALBUMIN 3.3* 3.4* 3.3*  AST 33 37 33  ALT 30 33 27  ALKPHOS 252* 241* 228*  BILITOT 0.8 0.8 1.0   No results found for: "CEA"   RADIOGRAPHIC STUDIES: I have personally reviewed the radiological images as listed and agreed with the findings in the report. No results found.  ASSESSMENT & PLAN:   No problem-specific Assessment & Plan notes found for this encounter.  Rectal cancer ([place]) Rectal cancer ([place])  # STAGE IV- Synchronous primaries a] rectal cancer-10 cm-adenocarcinoma & b] transverse colon-- intramucosal adenoca]; abdominal lymphadenopathy; liver metastases; omental metastases. -Currently on 5-FU plus Bev- CT MAY 26th, 2023-  Interval development of multiple basilar predominant tiny bilateral pulmonary nodules, all new since previous chest CT of 12/27/2020 and abdomen CT of 03/15/2021- concerning for metastatic disease.  Wall thickening in the ascending colon, at and just distal  to the level of the ileocecal valve with soft tissue stranding in the pericolonic fat of this region, similar to prior. Circumferential wall thickening again noted in the rectum.; Omental Nodule-STABLE [10x24m].  Given the subcentimeter nature of the nodules  recommend monitoring for now. Continue chemotherapy as planned. STABLE.    # Labs reviewed and acceptable for treatment. Proceed with 5FU+Avastin maintenance continue BEV [see below]. He has CT C/A/P planned for 08/14/21.    # Anemia/iron deficiency hemoglobin ~13-  secondary to chronic GI bleed vs tumor. Hemoglobin has normalized and he is clinically, asymptomatic.    # Thrombocytopenia/secondary chemotherapy/on aspirin-platelets- 80-90-monitor closely- STABLE.OK to proceed with treatment today.    # HTN- 180/102; Continues to monitor at home. Today, 152/89. Home ranges from [phone]/80s-90s. Stable. OK to proceed with avastin.    # PN G-2-3 sec to Oxaliplatin [last 04/02/2021]. discontinued oxaliplatin for now. Poor tolerance to cymbalta 30 mg q day. Reviewed trial of capsaicin cream and acupuncture. Likely that response would be minimal but can try.    # Chronic kidney disease stage III-[GFR-40 ] -stable. Continue keeping up with hydration.   # Skin rash: Bil LE- ? Allergic- improved- continue Hydrocortisone prn- STABLE.    * medical records- o  * CEA- not marker-   # DISPOSITION:   # 5FU+Bev today;  # Follow up in 2 weeks as scheduled.    LVerlon Au NP 08/07/2021   CC: Dr. BRogue Bussing

## 2021-08-07 NOTE — Patient Instructions (Signed)
[facility] [place]  Discharge Instructions: Thank you for choosing [facility] to provide your oncology and hematology care.  If you have a lab appointment with the [facility], please go directly to the [facility] and check in at the registration area.  Wear comfortable clothing and clothing appropriate for easy access to any Portacath or PICC line.   We strive to give you quality time with your provider. You may need to reschedule your appointment if you arrive late (15 or more minutes).  Arriving late affects you and other patients whose appointments are after yours.  Also, if you miss three or more appointments without notifying the office, you may be dismissed from the clinic at the provider's discretion.      For prescription refill requests, have your pharmacy contact our office and allow 72 hours for refills to be completed.    Today you received the following chemotherapy and/or immunotherapy agents Mvasi and Adrucil       To help prevent nausea and vomiting after your treatment, we encourage you to take your nausea medication as directed.  BELOW ARE SYMPTOMS THAT SHOULD BE REPORTED IMMEDIATELY: *FEVER GREATER THAN 100.4 F (38 C) OR HIGHER *CHILLS OR SWEATING *NAUSEA AND VOMITING THAT IS NOT CONTROLLED WITH YOUR NAUSEA MEDICATION *UNUSUAL SHORTNESS OF BREATH *UNUSUAL BRUISING OR BLEEDING *URINARY PROBLEMS (pain or burning when urinating, or frequent urination) *BOWEL PROBLEMS (unusual diarrhea, constipation, pain near the anus) TENDERNESS IN MOUTH AND THROAT WITH OR WITHOUT PRESENCE OF ULCERS (sore throat, sores in mouth, or a toothache) UNUSUAL RASH, SWELLING OR PAIN  UNUSUAL VAGINAL DISCHARGE OR ITCHING   Items with * indicate a potential emergency and should be followed up as soon as possible or go to the Emergency Department if any problems should occur.  Please show the CHEMOTHERAPY ALERT CARD or IMMUNOTHERAPY ALERT CARD at  check-in to the Emergency Department and triage nurse.  Should you have questions after your visit or need to cancel or reschedule your appointment, please contact [facility] [place] [facility] [place]  [phone] and follow the prompts.  Office hours are 8:00 a.m. to 4:30 p.m. Monday - Friday. Please note that voicemails left after 4:00 p.m. may not be returned until the following business day.  We are closed weekends and major holidays. You have access to a nurse at all times for urgent questions. Please call the main number to the clinic [phone] and follow the prompts.  For any non-urgent questions, you may also contact your provider using MyChart. We now offer e-Visits for anyone 2 and older to request care online for non-urgent symptoms. For details visit [email or web address].   Also download the MyChart app! Go to the app store, search "MyChart", open the app, select [facility], and log in with your MyChart username and password.  Masks are optional in the cancer centers. If you would like for your care team to wear a mask while they are taking care of you, please let them know. For doctor visits, patients may have with them one support person who is at least 78 years old. At this time, visitors are not allowed in the infusion area.

## 2021-08-09 ENCOUNTER — Inpatient Hospital Stay: Payer: Medicare HMO

## 2021-08-09 DIAGNOSIS — D509 Iron deficiency anemia, unspecified: Secondary | ICD-10-CM | POA: Diagnosis not present

## 2021-08-09 DIAGNOSIS — Z5111 Encounter for antineoplastic chemotherapy: Secondary | ICD-10-CM | POA: Diagnosis not present

## 2021-08-09 DIAGNOSIS — Z79899 Other long term (current) drug therapy: Secondary | ICD-10-CM | POA: Diagnosis not present

## 2021-08-09 DIAGNOSIS — I129 Hypertensive chronic kidney disease with stage 1 through stage 4 chronic kidney disease, or unspecified chronic kidney disease: Secondary | ICD-10-CM | POA: Diagnosis not present

## 2021-08-09 DIAGNOSIS — N183 Chronic kidney disease, stage 3 unspecified: Secondary | ICD-10-CM | POA: Diagnosis not present

## 2021-08-09 DIAGNOSIS — C786 Secondary malignant neoplasm of retroperitoneum and peritoneum: Secondary | ICD-10-CM | POA: Diagnosis not present

## 2021-08-09 DIAGNOSIS — C20 Malignant neoplasm of rectum: Secondary | ICD-10-CM

## 2021-08-09 DIAGNOSIS — D696 Thrombocytopenia, unspecified: Secondary | ICD-10-CM | POA: Diagnosis not present

## 2021-08-09 DIAGNOSIS — C787 Secondary malignant neoplasm of liver and intrahepatic bile duct: Secondary | ICD-10-CM | POA: Diagnosis not present

## 2021-08-09 MED ORDER — HEPARIN SOD (PORK) LOCK FLUSH 100 UNIT/ML IV SOLN
500.0000 [IU] | Freq: Once | INTRAVENOUS | Status: AC | PRN
  Administered 2021-08-09: 500 [IU]
  Filled 2021-08-09: qty 5

## 2021-08-09 MED ORDER — SODIUM CHLORIDE 0.9% FLUSH
10.0000 mL | INTRAVENOUS | Status: DC | PRN
  Administered 2021-08-09: 10 mL
  Filled 2021-08-09: qty 10

## 2021-08-13 NOTE — Progress Notes (Unsigned)
08/14/21 3:56 PM   Albert Giles 1945-12-11 [id number]  Referring provider:  [name], MD [address] [place],  [place] [address]  Chief Complaint  Patient presents with   Follow-up    Urological history  1. High risk hematuria -former smoker -Eval 2017 with CTU and cysto with bilobar prostate hypertrophy only -Eval 2019 with RUS, MRI and cysto with right renal cyst and biateral lobe enlargement prostate; prominent hypervascularity - UA on 06/26/2021 was negative for hematuria  2. BPH with LU TS -PSA, 06/2021 - 2.2 -I PSS 4/2   3. ED -contributing factors of age, BPH, HTN, former smoker and CAD -SHIM 93 -managed with tadalafil 20 mg, on-demand-dosing    Chief Complaint  Patient presents with   Follow-up     HPI: Albert Giles is a 76 y.o.male who presents today for to discuss Xiaflex.  He states he is wondering if he can get a prescription for the Xiaflex.  He states he has a little upward curve in his penis when erect.  He states he does not have difficulty with erections and can have satisfactory intercourse.  PMH: Past Medical History:  Diagnosis Date   CAD (coronary artery disease) 06/2014   UA with NSTEMI - cath with 99% prox L circ s/p stent, EF 40% ([place] [place] [facility])   Emphysema    mild   Ex-smoker    Family history of breast cancer    Family history of colon cancer    Family history of pancreatic cancer    NSTEMI (non-ST elevated myocardial infarction) ([facility]) 07/13/2014    Surgical History: Past Surgical History:  Procedure Laterality Date   CARDIAC CATHETERIZATION N/A 07/14/2014   Left Heart Cath and Coronary Angiography with stent placement;  Surgeon: Teodoro Spray, MD   CARDIAC CATHETERIZATION N/A 07/14/2014   Coronary Stent Intervention;  Surgeon: Yolonda Kida, MD   COLONOSCOPY WITH PROPOFOL N/A 09/06/2020   Procedure: COLONOSCOPY WITH PROPOFOL;  Surgeon: Lin Landsman, MD;  Location: [facility] ENDOSCOPY;  Service:  Gastroenterology;  Laterality: N/A;   CYSTECTOMY     on back   ESOPHAGOGASTRODUODENOSCOPY N/A 09/06/2020   Procedure: ESOPHAGOGASTRODUODENOSCOPY (EGD);  Surgeon: Lin Landsman, MD;  Location: [facility] ENDOSCOPY;  Service: Gastroenterology;  Laterality: N/A;   ESOPHAGOGASTRODUODENOSCOPY N/A 06/29/2021   Procedure: ESOPHAGOGASTRODUODENOSCOPY (EGD);  Surgeon: Lin Landsman, MD;  Location: [facility] ENDOSCOPY;  Service: Gastroenterology;  Laterality: N/A;   INGUINAL HERNIA REPAIR Right 08/17/04   IR IMAGING GUIDED PORT INSERTION  09/13/2020    Home Medications:  Allergies as of 08/14/2021   No Known Allergies      Medication List        Accurate as of August 14, 2021  3:56 PM. If you have any questions, ask your nurse or doctor.          aspirin EC 81 MG tablet Take 1 tablet (81 mg total) by mouth daily.   atorvastatin 40 MG tablet Commonly known as: LIPITOR Take 1 tablet (40 mg total) by mouth daily at 6 PM.   B-12 1000 MCG Subl Place 1 tablet under the tongue daily.   DULoxetine 30 MG capsule Commonly known as: CYMBALTA Take 1 capsule (30 mg total) by mouth daily.   ferrous sulfate 324 (65 Fe) MG Tbec Take 1 tablet (325 mg total) by mouth every other day.   lidocaine-prilocaine cream Commonly known as: EMLA Apply 1 application topically as needed (to port a cath 1 hour prior to  each chemotherapy).   metoprolol tartrate 25 MG tablet Commonly known as: LOPRESSOR Take 1 tablet (25 mg total) by mouth 2 (two) times daily.   ondansetron 8 MG tablet Commonly known as: ZOFRAN Take 1 tablet (8 mg total) by mouth every 8 (eight) hours as needed for nausea or vomiting.   pantoprazole 40 MG tablet Commonly known as: PROTONIX TAKE 1 TABLET BY MOUTH TWICE DAILY BEFORE A MEAL   prochlorperazine 10 MG tablet Commonly known as: COMPAZINE Take 1 tablet (10 mg total) by mouth every 6 (six) hours as needed for nausea or vomiting.   tadalafil 20 MG tablet Commonly known as:  CIALIS TAKE ONE TABLET BY MOUTH DAILY AS NEEDED FOR ERECTILE DYSFUNCTION   Vitamin D3 25 MCG (1000 UT) Caps Take 1 capsule (1,000 Units total) by mouth daily.        Allergies:  No Known Allergies  Family History: Family History  Problem Relation Age of Onset   Cancer Mother 49       colon   Cancer Sister        breast   Crohn's disease Sister    Cancer Daughter        anal   Crohn's disease Niece    CAD Neg Hx    Stroke Neg Hx    Diabetes Neg Hx    Prostate cancer Neg Hx    Kidney cancer Neg Hx    Bladder Cancer Neg Hx     Social History:  reports that he quit smoking about 23 years ago. His smoking use included cigarettes. He has a 35.00 pack-year smoking history. He has never used smokeless tobacco. He reports that he does not drink alcohol and does not use drugs.   Physical Exam: BP (!) 159/91   Pulse 63   Ht '5\' 8"'$  (1.727 m)   Wt 170 lb (77.1 kg)   BMI 25.85 kg/m   Constitutional:  Well nourished. Alert and oriented, No acute distress. HEENT: [place] AT, moist mucus membranes.  Trachea midline Cardiovascular: No clubbing, cyanosis, or edema. Respiratory: Normal respiratory effort, no increased work of breathing. Neurologic: Grossly intact, no focal deficits, moving all 4 extremities. Psychiatric: Normal mood and affect.   Laboratory Data:    Latest Ref Rng & Units 08/07/2021    8:17 AM 07/24/2021    8:33 AM 07/10/2021    9:01 AM  CBC  WBC 4.0 - 10.5 K/uL 6.5  7.0  7.4   Hemoglobin 13.0 - 17.0 g/dL 15.1  15.0  14.7   Hematocrit 39.0 - 52.0 % 46.3  46.0  45.4   Platelets 150 - 400 K/uL 93  95  84        Latest Ref Rng & Units 08/07/2021    8:17 AM 07/24/2021    8:33 AM 07/10/2021    9:01 AM  CMP  Glucose 70 - 99 mg/dL 130  114  100   BUN 8 - 23 mg/dL '19  16  15   '$ Creatinine 0.61 - 1.24 mg/dL 1.71  1.71  1.73   Sodium 135 - 145 mmol/L 136  135  136   Potassium 3.5 - 5.1 mmol/L 4.1  4.3  4.3   Chloride 98 - 111 mmol/L 110  104  106   CO2 22 - 32 mmol/L '23   23  24   '$ Calcium 8.9 - 10.3 mg/dL 8.4  8.5  8.3   Total Protein 6.5 - 8.1 g/dL 6.5  6.2  6.2  Total Bilirubin 0.3 - 1.2 mg/dL 1.0  1.0  0.8   Alkaline Phos 38 - 126 U/L 216  228  241   AST 15 - 41 U/L 38  33  37   ALT 0 - 44 U/L 30  27  33     Component     Latest Ref Rng 08/07/2021  Glucose, UA     NEGATIVE mg/dL NEGATIVE   WBC, UA     0 - 5 WBC/hpf 0-5   Bacteria, UA     NONE SEEN  RARE !   Color, Urine     YELLOW  YELLOW !   Appearance     CLEAR  CLEAR !   Specific Gravity, Urine     1.005 - 1.030  1.016   pH     5.0 - 8.0  5.0   Hgb urine dipstick     NEGATIVE  NEGATIVE   Bilirubin Urine     NEGATIVE  NEGATIVE   Ketones, ur     NEGATIVE mg/dL NEGATIVE   Protein     NEGATIVE mg/dL NEGATIVE   Nitrite     NEGATIVE  NEGATIVE   Leukocytes,Ua     NEGATIVE  NEGATIVE   RBC / HPF     0 - 5 RBC/hpf 0-5   Squamous Epithelial / LPF     0 - 5  NONE SEEN   Mucus PRESENT   Hyaline Casts, UA PRESENT     Legend: ! Abnormal I have reviewed the labs.    Assessment & Plan:    1. Peyronie's disease -I explained to him that Shyrl Numbers is not available by prescription and that a physician has to inject the medication into the plaque and based on his description, the curvature is not severe and it is not interfering with intercourse and therefore he likely will not qualify for Xiaflex treatment at this time  Return for keep yearly follow up appointment .  Zara Council, PA-C   [facility] [address] [place], [place] [address] [phone]

## 2021-08-14 ENCOUNTER — Other Ambulatory Visit: Payer: Self-pay

## 2021-08-14 ENCOUNTER — Ambulatory Visit: Payer: Medicare HMO | Admitting: Urology

## 2021-08-14 ENCOUNTER — Encounter: Payer: Self-pay | Admitting: Urology

## 2021-08-14 ENCOUNTER — Ambulatory Visit
Admission: RE | Admit: 2021-08-14 | Discharge: 2021-08-14 | Disposition: A | Discharge status: Home / Self Care | Payer: Medicare HMO | Source: Ambulatory Visit | Attending: Internal Medicine | Admitting: Internal Medicine

## 2021-08-14 VITALS — BP 159/91 | HR 63 | Ht 68.0 in | Wt 170.0 lb

## 2021-08-14 DIAGNOSIS — N401 Enlarged prostate with lower urinary tract symptoms: Secondary | ICD-10-CM

## 2021-08-14 DIAGNOSIS — C20 Malignant neoplasm of rectum: Secondary | ICD-10-CM | POA: Insufficient documentation

## 2021-08-14 DIAGNOSIS — N529 Male erectile dysfunction, unspecified: Secondary | ICD-10-CM

## 2021-08-14 DIAGNOSIS — N486 Induration penis plastica: Secondary | ICD-10-CM | POA: Diagnosis not present

## 2021-08-14 DIAGNOSIS — K449 Diaphragmatic hernia without obstruction or gangrene: Secondary | ICD-10-CM | POA: Diagnosis not present

## 2021-08-14 DIAGNOSIS — J432 Centrilobular emphysema: Secondary | ICD-10-CM | POA: Diagnosis not present

## 2021-08-14 DIAGNOSIS — K6389 Other specified diseases of intestine: Secondary | ICD-10-CM | POA: Diagnosis not present

## 2021-08-14 DIAGNOSIS — R319 Hematuria, unspecified: Secondary | ICD-10-CM

## 2021-08-14 DIAGNOSIS — N281 Cyst of kidney, acquired: Secondary | ICD-10-CM | POA: Diagnosis not present

## 2021-08-14 DIAGNOSIS — N138 Other obstructive and reflux uropathy: Secondary | ICD-10-CM

## 2021-08-14 DIAGNOSIS — C189 Malignant neoplasm of colon, unspecified: Secondary | ICD-10-CM | POA: Diagnosis not present

## 2021-08-14 DIAGNOSIS — J929 Pleural plaque without asbestos: Secondary | ICD-10-CM | POA: Diagnosis not present

## 2021-08-14 DIAGNOSIS — J849 Interstitial pulmonary disease, unspecified: Secondary | ICD-10-CM | POA: Diagnosis not present

## 2021-08-14 MED ORDER — IOHEXOL 300 MG/ML  SOLN
80.0000 mL | Freq: Once | INTRAMUSCULAR | Status: AC | PRN
  Administered 2021-08-14: 80 mL via INTRAVENOUS

## 2021-08-16 ENCOUNTER — Ambulatory Visit: Payer: Medicare HMO | Admitting: Urology

## 2021-08-20 MED FILL — Dexamethasone Sodium Phosphate Inj 100 MG/10ML: INTRAMUSCULAR | Qty: 1 | Status: AC

## 2021-08-21 ENCOUNTER — Inpatient Hospital Stay: Payer: Medicare HMO | Attending: Internal Medicine

## 2021-08-21 ENCOUNTER — Encounter: Payer: Self-pay | Admitting: Internal Medicine

## 2021-08-21 ENCOUNTER — Inpatient Hospital Stay (HOSPITAL_BASED_OUTPATIENT_CLINIC_OR_DEPARTMENT_OTHER): Payer: Medicare HMO | Admitting: Internal Medicine

## 2021-08-21 ENCOUNTER — Inpatient Hospital Stay: Payer: Medicare HMO

## 2021-08-21 VITALS — BP 172/84

## 2021-08-21 DIAGNOSIS — N183 Chronic kidney disease, stage 3 unspecified: Secondary | ICD-10-CM | POA: Diagnosis not present

## 2021-08-21 DIAGNOSIS — D649 Anemia, unspecified: Secondary | ICD-10-CM | POA: Diagnosis not present

## 2021-08-21 DIAGNOSIS — C7802 Secondary malignant neoplasm of left lung: Secondary | ICD-10-CM | POA: Insufficient documentation

## 2021-08-21 DIAGNOSIS — C786 Secondary malignant neoplasm of retroperitoneum and peritoneum: Secondary | ICD-10-CM | POA: Diagnosis not present

## 2021-08-21 DIAGNOSIS — I10 Essential (primary) hypertension: Secondary | ICD-10-CM | POA: Diagnosis not present

## 2021-08-21 DIAGNOSIS — C787 Secondary malignant neoplasm of liver and intrahepatic bile duct: Secondary | ICD-10-CM | POA: Diagnosis not present

## 2021-08-21 DIAGNOSIS — Z5111 Encounter for antineoplastic chemotherapy: Secondary | ICD-10-CM | POA: Insufficient documentation

## 2021-08-21 DIAGNOSIS — Z79899 Other long term (current) drug therapy: Secondary | ICD-10-CM | POA: Diagnosis not present

## 2021-08-21 DIAGNOSIS — D696 Thrombocytopenia, unspecified: Secondary | ICD-10-CM | POA: Insufficient documentation

## 2021-08-21 DIAGNOSIS — C20 Malignant neoplasm of rectum: Secondary | ICD-10-CM | POA: Diagnosis not present

## 2021-08-21 DIAGNOSIS — C7801 Secondary malignant neoplasm of right lung: Secondary | ICD-10-CM | POA: Diagnosis not present

## 2021-08-21 LAB — COMPREHENSIVE METABOLIC PANEL
ALT: 28 U/L (ref 0–44)
AST: 34 U/L (ref 15–41)
Albumin: 3.3 g/dL — ABNORMAL LOW (ref 3.5–5.0)
Alkaline Phosphatase: 227 U/L — ABNORMAL HIGH (ref 38–126)
Anion gap: 6 (ref 5–15)
BUN: 15 mg/dL (ref 8–23)
CO2: 24 mmol/L (ref 22–32)
Calcium: 8.6 mg/dL — ABNORMAL LOW (ref 8.9–10.3)
Chloride: 107 mmol/L (ref 98–111)
Creatinine, Ser: 1.68 mg/dL — ABNORMAL HIGH (ref 0.61–1.24)
GFR, Estimated: 41 mL/min — ABNORMAL LOW (ref >60)
Glucose, Bld: 156 mg/dL — ABNORMAL HIGH (ref 70–99)
Potassium: 4 mmol/L (ref 3.5–5.1)
Sodium: 137 mmol/L (ref 135–145)
Total Bilirubin: 0.7 mg/dL (ref 0.3–1.2)
Total Protein: 6.1 g/dL — ABNORMAL LOW (ref 6.5–8.1)

## 2021-08-21 LAB — CBC WITH DIFFERENTIAL/PLATELET
Abs Immature Granulocytes: 0.02 10*3/uL (ref 0.00–0.07)
Basophils Absolute: 0.1 10*3/uL (ref 0.0–0.1)
Basophils Relative: 1 %
Eosinophils Absolute: 0.5 10*3/uL (ref 0.0–0.5)
Eosinophils Relative: 7 %
HCT: 47.1 % (ref 39.0–52.0)
Hemoglobin: 15.2 g/dL (ref 13.0–17.0)
Immature Granulocytes: 0 %
Lymphocytes Relative: 20 %
Lymphs Abs: 1.4 10*3/uL (ref 0.7–4.0)
MCH: 33.2 pg (ref 26.0–34.0)
MCHC: 32.3 g/dL (ref 30.0–36.0)
MCV: 102.8 fL — ABNORMAL HIGH (ref 80.0–100.0)
Monocytes Absolute: 0.6 10*3/uL (ref 0.1–1.0)
Monocytes Relative: 8 %
Neutro Abs: 4.7 10*3/uL (ref 1.7–7.7)
Neutrophils Relative %: 64 %
Platelets: 88 10*3/uL — ABNORMAL LOW (ref 150–400)
RBC: 4.58 MIL/uL (ref 4.22–5.81)
RDW: 15.9 % — ABNORMAL HIGH (ref 11.5–15.5)
WBC: 7.3 10*3/uL (ref 4.0–10.5)
nRBC: 0 % (ref 0.0–0.2)

## 2021-08-21 LAB — URINALYSIS, COMPLETE (UACMP) WITH MICROSCOPIC
Bilirubin Urine: NEGATIVE
Glucose, UA: NEGATIVE mg/dL
Hgb urine dipstick: NEGATIVE
Ketones, ur: NEGATIVE mg/dL
Leukocytes,Ua: NEGATIVE
Nitrite: NEGATIVE
Protein, ur: NEGATIVE mg/dL
Specific Gravity, Urine: 1.013 (ref 1.005–1.030)
Squamous Epithelial / HPF: NONE SEEN (ref 0–5)
pH: 6 (ref 5.0–8.0)

## 2021-08-21 MED ORDER — SODIUM CHLORIDE 0.9 % IV SOLN
Freq: Once | INTRAVENOUS | Status: AC
  Filled 2021-08-21: qty 250

## 2021-08-21 MED ORDER — DIPHENOXYLATE-ATROPINE 2.5-0.025 MG PO TABS: 1.0000 | ORAL_TABLET | Freq: Four times a day (QID) | ORAL | 0 refills | Status: DC | PRN

## 2021-08-21 MED ORDER — SODIUM CHLORIDE 0.9 % IV SOLN
10.0000 mg/kg | INTRAVENOUS | Status: DC
  Administered 2021-08-21: 800 mg via INTRAVENOUS
  Filled 2021-08-21: qty 32

## 2021-08-21 MED ORDER — SODIUM CHLORIDE 0.9 % IV SOLN
10.0000 mg | Freq: Once | INTRAVENOUS | Status: AC
  Administered 2021-08-21: 10 mg via INTRAVENOUS
  Filled 2021-08-21: qty 10

## 2021-08-21 MED ORDER — SODIUM CHLORIDE 0.9 % IV SOLN
2400.0000 mg/m2 | INTRAVENOUS | Status: DC
  Administered 2021-08-21: 4650 mg via INTRAVENOUS
  Filled 2021-08-21: qty 93

## 2021-08-21 NOTE — Patient Instructions (Signed)
[facility] AT [place]  Discharge Instructions: Thank you for choosing [facility] to provide your oncology and hematology care.  If you have a lab appointment with the [facility], please go directly to the [facility] and check in at the registration area.  Wear comfortable clothing and clothing appropriate for easy access to any Portacath or PICC line.   We strive to give you quality time with your provider. You may need to reschedule your appointment if you arrive late (15 or more minutes).  Arriving late affects you and other patients whose appointments are after yours.  Also, if you miss three or more appointments without notifying the office, you may be dismissed from the clinic at the provider's discretion.      For prescription refill requests, have your pharmacy contact our office and allow 72 hours for refills to be completed.    Today you received the following chemotherapy and/or immunotherapy agents MVASI & Adrucil      To help prevent nausea and vomiting after your treatment, we encourage you to take your nausea medication as directed.  BELOW ARE SYMPTOMS THAT SHOULD BE REPORTED IMMEDIATELY: *FEVER GREATER THAN 100.4 F (38 C) OR HIGHER *CHILLS OR SWEATING *NAUSEA AND VOMITING THAT IS NOT CONTROLLED WITH YOUR NAUSEA MEDICATION *UNUSUAL SHORTNESS OF BREATH *UNUSUAL BRUISING OR BLEEDING *URINARY PROBLEMS (pain or burning when urinating, or frequent urination) *BOWEL PROBLEMS (unusual diarrhea, constipation, pain near the anus) TENDERNESS IN MOUTH AND THROAT WITH OR WITHOUT PRESENCE OF ULCERS (sore throat, sores in mouth, or a toothache) UNUSUAL RASH, SWELLING OR PAIN  UNUSUAL VAGINAL DISCHARGE OR ITCHING   Items with * indicate a potential emergency and should be followed up as soon as possible or go to the Emergency Department if any problems should occur.  Please show the CHEMOTHERAPY ALERT CARD or IMMUNOTHERAPY ALERT CARD at  check-in to the Emergency Department and triage nurse.  Should you have questions after your visit or need to cancel or reschedule your appointment, please contact [facility] [place] AT [place]  [phone] and follow the prompts.  Office hours are 8:00 a.m. to 4:30 p.m. Monday - Friday. Please note that voicemails left after 4:00 p.m. may not be returned until the following business day.  We are closed weekends and major holidays. You have access to a nurse at all times for urgent questions. Please call the main number to the clinic [phone] and follow the prompts.  For any non-urgent questions, you may also contact your provider using MyChart. We now offer e-Visits for anyone 38 and older to request care online for non-urgent symptoms. For details visit [email or web address].   Also download the MyChart app! Go to the app store, search "MyChart", open the app, select [facility], and log in with your MyChart username and password.  Masks are optional in the cancer centers. If you would like for your care team to wear a mask while they are taking care of you, please let them know. For doctor visits, patients may have with them one support person who is at least 78 years old. At this time, visitors are not allowed in the infusion area.

## 2021-08-21 NOTE — Assessment & Plan Note (Addendum)
#  STAGE IV-[N-RAS MUTATED] Synchronous primaries a] rectal cancer-10 cm-adenocarcinoma & b] transverse colon-- intramucosal adenoca]; abdominal lymphadenopathy; liver metastases; omental metastases. -Currently on 5-FU plus Bev. AUG 1st, 2023- CT CAP-continued increasing multifocal bilateral pulmonary metastasis; no progression of disease anywhere else unchanged wall thickening involving the ascending colon/rectum.   # Recommend switching to FOLFIRI+ BEV; added irinotecan.  Discussed the potential risk of nausea vomiting along with diarrhea.  Called in prescription for Lomotil.  We will have the patient take Lomotil 30 to 60 minutes before treatment.  # proceed with 5FU+Avastin maintenance continue BEV [see below]  Labs today reviewed;  acceptable for treatment today.   # Anemia/iron deficiency hemoglobin ~13-  secondary to chronic GI bleeding/tumor-STABLE  # Thrombocytopenia/secondary chemotherapy/on aspirin-platelets- 80-90-monitor closely- STABLE  # HTN- 180/102; at home BP reviewed- 130-150/ DBP80-90s.continue checking BP at home/reviewed the log from home.  Proceed with avastin today.STABLE  # PN G-2-3 sec to Oxaliplatin [last 04/02/2021]. discontinued oxaliplatin for now. Poor tolerance to cymbalta 30 mg q day.STABLE  # Chronic kidney disease stage III-[GFR-40 ] -stable.  Continue keeping up with hydration.STABLE  # #Incidental findings on Imaging  AUG, 2023: Emphysema atherosclerosis I reviewed/discussed/counseled the patient.   * CEA- not marker;  # DISPOSITION:   # have chemo RN draw the misc order lab today  # 5FU+Bev today; pump off after 48 hours   # Follow up in 2 weeks- MD: labs- cbc/cmp;UA  FOLFIRI + bev; Pump off 48 hours later- Dr.B  # I reviewed the blood work- with the patient in detail; also reviewed the imaging independently [as summarized above]; and with the patient in detail.   # 40 minutes face-to-face with the patient discussing the above plan of care; more than  50% of time spent on prognosis/ natural history; counseling and coordination.    

## 2021-08-21 NOTE — Progress Notes (Signed)
[place] NOTE  Patient Care Team: [name], MD as PCP - General (Family Medicine) Clent Jacks, RN as Oncology Nurse Navigator Cammie Sickle, MD as Consulting Physician (Oncology)  CHIEF COMPLAINTS/PURPOSE OF CONSULTATION: colon/rectal cancer #  Oncology History Overview Note  # Malignant partially obstructing tumor in the transverse colon/70 cm proximal to the anus- C. COLON MASS, 70 CM; COLD BIOPSY:  - INTRAMUCOSAL ADENOCARCINOMA AT LEAST;  # One 20 mm polyp in the transverse colon, removed with mucosal resection. Resected and retrieved. Clips were placed. Tattooed.  # tumor in the mid rectum and at 10 cm proximal to the anus. Biopsied.      SEE COMMENT.   Comment:  There is no definitive evidence of invasion in this sample.  The  findings may not accurately represent the entire underlying lesion;  clinical correlation is recommended.   D. COLON POLYP, TRANSVERSE; HOT SNARE:  - TUBULOVILLOUS ADENOMA.  - NEGATIVE FOR HIGH GRADE DYSPLASIA AND MALIGNANCY.   E. RECTUM MASS; COLD BIOPSY:  - INVASIVE ADENOCARCINOMA, MODERATELY TO POORLY DIFFERENTIATED. ----------------------------------   # PET scan: SEP 2022-proximal right colon [adjacent mesenteric lymph nodes] and rectal hypermetabolism.  Additional subcentimeter bilateral hypermetabolic lymphadenopathy noted in the retroperitoneal/left external iliac; inferior right lobe of the liver concerning for metastatic disease; right lower quadrant soft tissue mass concerning for peritoneal carcinomatosis; bilateral solid pulmonary nodules ~5 mm; MRI rectum- T stage: T4a; N stage:  N1.   # 09/12-2020- FOLFOX chemo [Dr.White/Dr.Vanga]; ADDED BEV with cycle #3-DC 5-FU bolus+LV; # 6-oxaliplatin dose reduced by 20%[thrombocytopenia]; LAST OX- FEB 20th, 2023- starting cycle #13-will discontinue oxaliplatin [given PN-G-12;/thrombocytopenia]  # MAY  27 th, 2023- CT scan-subcentimeter multiple lung  nodules concerning for for progression of disease; AUG 3rd, 2023- progression of lung nodules;   # AUG 22nd- START FOLFIRI+AVASTIN    Rectal cancer ([place])  09/11/2020 Initial Diagnosis   Rectal cancer ([place])   09/25/2020 -  Chemotherapy   Patient is on Treatment Plan : COLORECTAL FOLFOX q14d x 4 months     09/28/2020 Cancer Staging   Staging form: Colon and Rectum, AJCC 8th Edition - Clinical: Stage IVC (cT4a, cN1, cM1c) - Signed by Cammie Sickle, MD on 09/28/2020    Genetic Testing   Negative genetic testing. No pathogenic variants identified on the [facility] Multi-Cancer+RNA Panel. The report date is 11/05/2020.  The Multi-Cancer Panel + RNA offered by [facility] includes sequencing and/or deletion duplication testing of the following 84 genes: AIP, ALK, APC, ATM, AXIN2,BAP1,  BARD1, BLM, BMPR1A, BRCA1, BRCA2, BRIP1, CASR, CDC73, CDH1, CDK4, CDKN1B, CDKN1C, CDKN2A (p14ARF), CDKN2A (p16INK4a), CEBPA, CHEK2, CTNNA1, DICER1, DIS3L2, EGFR (c.2369C>T, p.Thr790Met variant only), EPCAM (Deletion/duplication testing only), FH, FLCN, GATA2, GPC3, GREM1 (Promoter region deletion/duplication testing only), HOXB13 (c.251G>A, p.Gly84Glu), HRAS, KIT, MAX, MEN1, MET, MITF (c.952G>A, p.Glu318Lys variant only), MLH1, MSH2, MSH3, MSH6, MUTYH, NBN, NF1, NF2, NTHL1, PALB2, PDGFRA, PHOX2B, PMS2, POLD1, POLE, POT1, PRKAR1A, PTCH1, PTEN, RAD50, RAD51C, RAD51D, RB1, RECQL4, RET, RUNX1, SDHAF2, SDHA (sequence changes only), SDHB, SDHC, SDHD, SMAD4, SMARCA4, SMARCB1, SMARCE1, STK11, SUFU, TERC, TERT, TMEM127, TP53, TSC1, TSC2, VHL, WRN and WT1.     HISTORY OF PRESENTING ILLNESS: Ambulating independently.  Accompanied by daughter.  Albert Giles 76 y.o.  male synchronous colon cancer/rectal cancer-stage IV-on 5 CIV FU+Avastin is here for follow-up review results of the CT scan  1 episode of mild epistaxis-resolved itself.   Mild tingling and numbness in extremities and fingertips not any worse.  No falls.  No  headaches.  No nosebleeds. No diarrhea.  No constipation.  Complains of mild fatigue.   Review of Systems  Constitutional:  Negative for chills, diaphoresis, fever and weight loss.  HENT:  Negative for nosebleeds and sore throat.   Eyes:  Negative for double vision.  Respiratory:  Negative for cough, hemoptysis, sputum production, shortness of breath and wheezing.   Cardiovascular:  Negative for chest pain, palpitations, orthopnea and leg swelling.  Gastrointestinal:  Negative for abdominal pain, blood in stool, constipation, diarrhea, heartburn, melena, nausea and vomiting.  Genitourinary:  Negative for dysuria, frequency and urgency.  Skin: Negative.  Negative for itching and rash.  Neurological:  Positive for tingling and sensory change. Negative for dizziness, focal weakness, weakness and headaches.  Endo/Heme/Allergies:  Does not bruise/bleed easily.  Psychiatric/Behavioral:  Negative for depression. The patient is not nervous/anxious and does not have insomnia.      MEDICAL HISTORY:  Past Medical History:  Diagnosis Date   CAD (coronary artery disease) 06/2014   UA with NSTEMI - cath with 99% prox L circ s/p stent, EF 40% ([place], [place] at [facility])   Emphysema    mild   Ex-smoker    Family history of breast cancer    Family history of colon cancer    Family history of pancreatic cancer    NSTEMI (non-ST elevated myocardial infarction) ([place]) 07/13/2014    SURGICAL HISTORY: Past Surgical History:  Procedure Laterality Date   CARDIAC CATHETERIZATION N/A 07/14/2014   Left Heart Cath and Coronary Angiography with stent placement;  Surgeon: Teodoro Spray, MD   CARDIAC CATHETERIZATION N/A 07/14/2014   Coronary Stent Intervention;  Surgeon: Yolonda Kida, MD   COLONOSCOPY WITH PROPOFOL N/A 09/06/2020   Procedure: COLONOSCOPY WITH PROPOFOL;  Surgeon: Lin Landsman, MD;  Location: [facility] ENDOSCOPY;  Service: Gastroenterology;  Laterality: N/A;   CYSTECTOMY     on back    ESOPHAGOGASTRODUODENOSCOPY N/A 09/06/2020   Procedure: ESOPHAGOGASTRODUODENOSCOPY (EGD);  Surgeon: Lin Landsman, MD;  Location: [facility] ENDOSCOPY;  Service: Gastroenterology;  Laterality: N/A;   ESOPHAGOGASTRODUODENOSCOPY N/A 06/29/2021   Procedure: ESOPHAGOGASTRODUODENOSCOPY (EGD);  Surgeon: Lin Landsman, MD;  Location[facility] ENDOSCOPY;  Service: Gastroenterology;  Laterality: N/A;   INGUINAL HERNIA REPAIR Right 08/17/04   IR IMAGING GUIDED PORT INSERTION  09/13/2020    SOCIAL HISTORY: Social History   Socioeconomic History   Marital status: Married    Spouse name: Not on file   Number of children: Not on file   Years of education: Not on file   Highest education level: Not on file  Occupational History   Not on file  Tobacco Use   Smoking status: Former    Packs/day: 1.00    Years: 35.00    Total pack years: 35.00    Types: Cigarettes    Quit date: 07/07/1998    Years since quitting: 23.1   Smokeless tobacco: Never  Vaping Use   Vaping Use: Never used  Substance and Sexual Activity   Alcohol use: No   Drug use: No   Sexual activity: Yes  Other Topics Concern   Not on file  Social History Narrative   Caffeine: 2 cups coffee, 2 cups tea/day   Lives with wife   Occupation: Higher education careers adviser, retired   Edu: HS   Activity: works in yard, walking   Diet: some water, fruits/vegetables daily   -------------------------------------------------------------------       [employer] [place]; [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 Quit smoking 20 years ago;  no alcohol; with wife; daughter- next door.    Social Determinants of Health   Financial Resource Strain: Low Risk  (06/16/2020)   Overall Financial Resource Strain (CARDIA)    Difficulty of Paying Living Expenses: Not hard at all  Food Insecurity: No Food Insecurity (06/16/2020)   Hunger Vital Sign    Worried About Running Out of Food in the Last Year: Never true    Ran Out of Food in the Last Year: Never true  Transportation  Needs: No Transportation Needs (06/16/2020)   PRAPARE - Hydrologist (Medical): No    Lack of Transportation (Non-Medical): No  Physical Activity: Inactive (06/16/2020)   Exercise Vital Sign    Days of Exercise per Week: 0 days    Minutes of Exercise per Session: 0 min  Stress: No Stress Concern Present (06/16/2020)   [place]    Feeling of Stress : Not at all  Social Connections: Not on file  Intimate Partner Violence: Not At Risk (06/16/2020)   Humiliation, Afraid, Rape, and Kick questionnaire    Fear of Current or Ex-Partner: No    Emotionally Abused: No    Physically Abused: No    Sexually Abused: No    FAMILY HISTORY: Family History  Problem Relation Age of Onset   Cancer Mother 80       colon   Cancer Sister        breast   Crohn's disease Sister    Cancer Daughter        anal   Crohn's disease Niece    CAD Neg Hx    Stroke Neg Hx    Diabetes Neg Hx    Prostate cancer Neg Hx    Kidney cancer Neg Hx    Bladder Cancer Neg Hx     ALLERGIES:  has No Known Allergies.  MEDICATIONS:  Current Outpatient Medications  Medication Sig Dispense Refill   aspirin EC 81 MG tablet Take 1 tablet (81 mg total) by mouth daily. 60 tablet 1   atorvastatin (LIPITOR) 40 MG tablet Take 1 tablet (40 mg total) by mouth daily at 6 PM. 60 tablet 1   Cholecalciferol (VITAMIN D3) 25 MCG (1000 UT) CAPS Take 1 capsule (1,000 Units total) by mouth daily. 30 capsule    Cyanocobalamin (B-12) 1000 MCG SUBL Place 1 tablet under the tongue daily.     diphenoxylate-atropine (LOMOTIL) 2.5-0.025 MG tablet Take 1 tablet by mouth 4 (four) times daily as needed for diarrhea or loose stools. Take it along with immodium 60 tablet 0   DULoxetine (CYMBALTA) 30 MG capsule Take 1 capsule (30 mg total) by mouth daily. 30 capsule 3   lidocaine-prilocaine (EMLA) cream Apply 1 application topically as needed (to port a cath 1  hour prior to each chemotherapy). 30 g 3   metoprolol tartrate (LOPRESSOR) 25 MG tablet Take 1 tablet (25 mg total) by mouth 2 (two) times daily. 60 tablet 1   ondansetron (ZOFRAN) 8 MG tablet Take 1 tablet (8 mg total) by mouth every 8 (eight) hours as needed for nausea or vomiting. 20 tablet 3   pantoprazole (PROTONIX) 40 MG tablet TAKE 1 TABLET BY MOUTH TWICE DAILY BEFORE A MEAL 60 tablet 11   prochlorperazine (COMPAZINE) 10 MG tablet Take 1 tablet (10 mg total) by mouth every 6 (six) hours as needed for nausea or vomiting. 30 tablet 3   tadalafil (CIALIS) 20 MG tablet TAKE ONE  TABLET BY MOUTH DAILY AS NEEDED FOR ERECTILE DYSFUNCTION 20 tablet 3   ferrous sulfate 324 (65 Fe) MG TBEC Take 1 tablet (325 mg total) by mouth every other day.     No current facility-administered medications for this visit.   Facility-Administered Medications Ordered in Other Visits  Medication Dose Route Frequency Provider Last Rate Last Admin   heparin lock flush 100 unit/mL  500 Units Intracatheter Once PRN Charlaine Dalton R, MD       sodium chloride flush (NS) 0.9 % injection 10 mL  10 mL Intravenous PRN Charlaine Dalton R, MD   10 mL at 10/09/20 0855   sodium chloride flush (NS) 0.9 % injection 10 mL  10 mL Intracatheter PRN Cammie Sickle, MD       .  PHYSICAL EXAMINATION: ECOG PERFORMANCE STATUS: 0 - Asymptomatic  Vitals:   08/21/21 0811 08/21/21 0900  BP: (!) 179/102 134/84  Pulse: 63   Temp: (!) 95.1 F (35.1 C)   SpO2: 98%      Filed Weights   08/21/21 0811  Weight: 172 lb 6.4 oz (78.2 kg)    Physical Exam Vitals and nursing note reviewed.  HENT:     Head: Normocephalic and atraumatic.     Mouth/Throat:     Pharynx: Oropharynx is clear.  Eyes:     Extraocular Movements: Extraocular movements intact.     Pupils: Pupils are equal, round, and reactive to light.  Cardiovascular:     Rate and Rhythm: Normal rate and regular rhythm.  Pulmonary:     Comments: Decreased  breath sounds bilaterally.  Abdominal:     Palpations: Abdomen is soft.  Musculoskeletal:        General: Normal range of motion.     Cervical back: Normal range of motion.  Skin:    General: Skin is warm.     Findings: Bruising present.  Neurological:     General: No focal deficit present.     Mental Status: He is alert and oriented to person, place, and time.  Psychiatric:        Behavior: Behavior normal.        Judgment: Judgment normal.    LABORATORY DATA:  I have reviewed the data as listed Lab Results  Component Value Date   WBC 7.3 08/21/2021   HGB 15.2 08/21/2021   HCT 47.1 08/21/2021   MCV 102.8 (H) 08/21/2021   PLT 88 (L) 08/21/2021   Recent Labs    07/24/21 0833 08/07/21 0817 08/21/21 0811  NA 135 136 137  K 4.3 4.1 4.0  CL 104 110 107  CO2 23 23 24   GLUCOSE 114* 130* 156*  BUN 16 19 15   CREATININE 1.71* 1.71* 1.68*  CALCIUM 8.5* 8.4* 8.6*  GFRNONAA 41* 40* 41*  PROT 6.2* 6.5 6.1*  ALBUMIN 3.3* 3.3* 3.3*  AST 33 38 34  ALT 27 30 28   ALKPHOS 228* 216* 227*  BILITOT 1.0 1.0 0.7  No results found for: "CEA"   RADIOGRAPHIC STUDIES: I have personally reviewed the radiological images as listed and agreed with the findings in the report.   ASSESSMENT & PLAN:   Rectal cancer ([place])  # STAGE IV-[N-RAS MUTATED] Synchronous primaries a] rectal cancer-10 cm-adenocarcinoma & b] transverse colon-- intramucosal adenoca]; abdominal lymphadenopathy; liver metastases; omental metastases. -Currently on 5-FU plus Bev. AUG 1st, 2023- CT CAP-continued increasing multifocal bilateral pulmonary metastasis; no progression of disease anywhere else unchanged wall thickening involving the ascending colon/rectum.   # Recommend  switching to FOLFIRI+ BEV; added irinotecan.  Discussed the potential risk of nausea vomiting along with diarrhea.  Called in prescription for Lomotil.  We will have the patient take Lomotil 30 to 60 minutes before treatment.   # proceed with  5FU+Avastin maintenance continue BEV [see below]  Labs today reviewed;  acceptable for treatment today.   # Anemia/iron deficiency hemoglobin ~13-  secondary to chronic GI bleeding/tumor-STABLE  # Thrombocytopenia/secondary chemotherapy/on aspirin-platelets- 80-90-monitor closely- STABLE  # HTN- 180/102; at home BP reviewed- 130-150/ DBP80-90s.continue checking BP at home/reviewed the log from home.  Proceed with avastin today.STABLE  # PN G-2-3 sec to Oxaliplatin [last 04/02/2021]. discontinued oxaliplatin for now. Poor tolerance to cymbalta 30 mg q day.STABLE  # Chronic kidney disease stage III-[GFR-40 ] -stable.  Continue keeping up with hydration.STABLE  # #Incidental findings on Imaging  AUG, 2023: Emphysema atherosclerosis I reviewed/discussed/counseled the patient.   * CEA- not marker;  # DISPOSITION:   # have chemo RN draw the misc order lab today  # 5FU+Bev today; pump off after 48 hours   # Follow up in 2 weeks- MD: labs- cbc/cmp;UA  FOLFIRI + bev; Pump off 48 hours later- Dr.B  # I reviewed the blood work- with the patient in detail; also reviewed the imaging independently [as summarized above]; and with the patient in detail.    Rectal cancer Lourdes Sledge, MD 08/21/2021   CC: Dr. Rogue Bussing

## 2021-08-23 ENCOUNTER — Inpatient Hospital Stay: Payer: Medicare HMO

## 2021-08-23 DIAGNOSIS — C7801 Secondary malignant neoplasm of right lung: Secondary | ICD-10-CM | POA: Diagnosis not present

## 2021-08-23 DIAGNOSIS — Z5111 Encounter for antineoplastic chemotherapy: Secondary | ICD-10-CM | POA: Diagnosis not present

## 2021-08-23 DIAGNOSIS — C786 Secondary malignant neoplasm of retroperitoneum and peritoneum: Secondary | ICD-10-CM | POA: Diagnosis not present

## 2021-08-23 DIAGNOSIS — I10 Essential (primary) hypertension: Secondary | ICD-10-CM | POA: Diagnosis not present

## 2021-08-23 DIAGNOSIS — D696 Thrombocytopenia, unspecified: Secondary | ICD-10-CM | POA: Diagnosis not present

## 2021-08-23 DIAGNOSIS — C20 Malignant neoplasm of rectum: Secondary | ICD-10-CM | POA: Diagnosis not present

## 2021-08-23 DIAGNOSIS — C787 Secondary malignant neoplasm of liver and intrahepatic bile duct: Secondary | ICD-10-CM | POA: Diagnosis not present

## 2021-08-23 DIAGNOSIS — D649 Anemia, unspecified: Secondary | ICD-10-CM | POA: Diagnosis not present

## 2021-08-23 DIAGNOSIS — Z79899 Other long term (current) drug therapy: Secondary | ICD-10-CM | POA: Diagnosis not present

## 2021-08-23 DIAGNOSIS — N183 Chronic kidney disease, stage 3 unspecified: Secondary | ICD-10-CM | POA: Diagnosis not present

## 2021-08-23 DIAGNOSIS — C7802 Secondary malignant neoplasm of left lung: Secondary | ICD-10-CM | POA: Diagnosis not present

## 2021-08-23 MED ORDER — SODIUM CHLORIDE 0.9% FLUSH
10.0000 mL | INTRAVENOUS | Status: DC | PRN
  Administered 2021-08-23: 10 mL
  Filled 2021-08-23: qty 10

## 2021-08-23 MED ORDER — HEPARIN SOD (PORK) LOCK FLUSH 100 UNIT/ML IV SOLN
500.0000 [IU] | Freq: Once | INTRAVENOUS | Status: AC | PRN
  Administered 2021-08-23: 500 [IU]
  Filled 2021-08-23: qty 5

## 2021-08-28 LAB — MISC LABCORP TEST (SEND OUT): Labcorp test code: 511200

## 2021-09-03 ENCOUNTER — Other Ambulatory Visit: Payer: Self-pay | Admitting: Internal Medicine

## 2021-09-03 DIAGNOSIS — C20 Malignant neoplasm of rectum: Secondary | ICD-10-CM

## 2021-09-03 MED FILL — Dexamethasone Sodium Phosphate Inj 100 MG/10ML: INTRAMUSCULAR | Qty: 1 | Status: AC

## 2021-09-03 NOTE — Progress Notes (Signed)
DISCONTINUE ON PATHWAY REGIMEN - Colorectal     A cycle is every 14 days:     Oxaliplatin      Leucovorin      Fluorouracil      Fluorouracil   **Always confirm dose/schedule in your pharmacy ordering system**  REASON: Disease Progression PRIOR TREATMENT: ROS56: mFOLFOX6 q14 Days x 4 Months TREATMENT RESPONSE: Progressive Disease (PD)  START ON PATHWAY REGIMEN - Colorectal     A cycle is every 14 days:     Bevacizumab-xxxx      Irinotecan      Leucovorin      Fluorouracil      Fluorouracil   **Always confirm dose/schedule in your pharmacy ordering system**  Patient Characteristics: Distant Metastases, Nonsurgical Candidate, KRAS/NRAS Mutation Positive/Unknown (BRAF V600 Wild-Type/Unknown), Standard Cytotoxic Therapy, Second Line Standard Cytotoxic Therapy, Bevacizumab Eligible Tumor Location: Rectal Therapeutic Status: Distant Metastases Microsatellite/Mismatch Repair Status: MSS/pMMR BRAF Mutation Status: Awaiting Test Results KRAS/NRAS Mutation Status: Awaiting Test Results Standard Cytotoxic Line of Therapy: Second Line Standard Cytotoxic Therapy Bevacizumab Eligibility: Eligible Intent of Therapy: Non-Curative / Palliative Intent, Discussed with Patient

## 2021-09-04 ENCOUNTER — Inpatient Hospital Stay (HOSPITAL_BASED_OUTPATIENT_CLINIC_OR_DEPARTMENT_OTHER): Payer: Medicare HMO | Admitting: Internal Medicine

## 2021-09-04 ENCOUNTER — Inpatient Hospital Stay: Payer: Medicare HMO

## 2021-09-04 ENCOUNTER — Ambulatory Visit: Payer: Medicare HMO

## 2021-09-04 ENCOUNTER — Encounter: Payer: Self-pay | Admitting: Internal Medicine

## 2021-09-04 VITALS — BP 151/93 | HR 53

## 2021-09-04 DIAGNOSIS — N183 Chronic kidney disease, stage 3 unspecified: Secondary | ICD-10-CM | POA: Diagnosis not present

## 2021-09-04 DIAGNOSIS — Z5111 Encounter for antineoplastic chemotherapy: Secondary | ICD-10-CM | POA: Diagnosis not present

## 2021-09-04 DIAGNOSIS — C20 Malignant neoplasm of rectum: Secondary | ICD-10-CM

## 2021-09-04 DIAGNOSIS — C787 Secondary malignant neoplasm of liver and intrahepatic bile duct: Secondary | ICD-10-CM | POA: Diagnosis not present

## 2021-09-04 DIAGNOSIS — Z79899 Other long term (current) drug therapy: Secondary | ICD-10-CM | POA: Diagnosis not present

## 2021-09-04 DIAGNOSIS — D696 Thrombocytopenia, unspecified: Secondary | ICD-10-CM | POA: Diagnosis not present

## 2021-09-04 DIAGNOSIS — I10 Essential (primary) hypertension: Secondary | ICD-10-CM | POA: Diagnosis not present

## 2021-09-04 DIAGNOSIS — D649 Anemia, unspecified: Secondary | ICD-10-CM | POA: Diagnosis not present

## 2021-09-04 DIAGNOSIS — C7801 Secondary malignant neoplasm of right lung: Secondary | ICD-10-CM | POA: Diagnosis not present

## 2021-09-04 DIAGNOSIS — C786 Secondary malignant neoplasm of retroperitoneum and peritoneum: Secondary | ICD-10-CM | POA: Diagnosis not present

## 2021-09-04 DIAGNOSIS — C7802 Secondary malignant neoplasm of left lung: Secondary | ICD-10-CM | POA: Diagnosis not present

## 2021-09-04 LAB — CBC WITH DIFFERENTIAL/PLATELET
Abs Immature Granulocytes: 0.02 10*3/uL (ref 0.00–0.07)
Basophils Absolute: 0.1 10*3/uL (ref 0.0–0.1)
Basophils Relative: 1 %
Eosinophils Absolute: 0.5 10*3/uL (ref 0.0–0.5)
Eosinophils Relative: 7 %
HCT: 46.2 % (ref 39.0–52.0)
Hemoglobin: 15.3 g/dL (ref 13.0–17.0)
Immature Granulocytes: 0 %
Lymphocytes Relative: 19 %
Lymphs Abs: 1.4 10*3/uL (ref 0.7–4.0)
MCH: 34.1 pg — ABNORMAL HIGH (ref 26.0–34.0)
MCHC: 33.1 g/dL (ref 30.0–36.0)
MCV: 102.9 fL — ABNORMAL HIGH (ref 80.0–100.0)
Monocytes Absolute: 0.7 10*3/uL (ref 0.1–1.0)
Monocytes Relative: 10 %
Neutro Abs: 4.6 10*3/uL (ref 1.7–7.7)
Neutrophils Relative %: 63 %
Platelets: 90 10*3/uL — ABNORMAL LOW (ref 150–400)
RBC: 4.49 MIL/uL (ref 4.22–5.81)
RDW: 16.5 % — ABNORMAL HIGH (ref 11.5–15.5)
WBC: 7.4 10*3/uL (ref 4.0–10.5)
nRBC: 0 % (ref 0.0–0.2)

## 2021-09-04 LAB — URINALYSIS, DIPSTICK ONLY
Bilirubin Urine: NEGATIVE
Glucose, UA: NEGATIVE mg/dL
Hgb urine dipstick: NEGATIVE
Ketones, ur: NEGATIVE mg/dL
Leukocytes,Ua: NEGATIVE
Nitrite: NEGATIVE
Protein, ur: NEGATIVE mg/dL
Specific Gravity, Urine: 1.014 (ref 1.005–1.030)
pH: 5 (ref 5.0–8.0)

## 2021-09-04 LAB — COMPREHENSIVE METABOLIC PANEL
ALT: 28 U/L (ref 0–44)
AST: 32 U/L (ref 15–41)
Albumin: 3.4 g/dL — ABNORMAL LOW (ref 3.5–5.0)
Alkaline Phosphatase: 227 U/L — ABNORMAL HIGH (ref 38–126)
Anion gap: 7 (ref 5–15)
BUN: 17 mg/dL (ref 8–23)
CO2: 24 mmol/L (ref 22–32)
Calcium: 8.9 mg/dL (ref 8.9–10.3)
Chloride: 106 mmol/L (ref 98–111)
Creatinine, Ser: 1.71 mg/dL — ABNORMAL HIGH (ref 0.61–1.24)
GFR, Estimated: 40 mL/min — ABNORMAL LOW (ref >60)
Glucose, Bld: 133 mg/dL — ABNORMAL HIGH (ref 70–99)
Potassium: 4.2 mmol/L (ref 3.5–5.1)
Sodium: 137 mmol/L (ref 135–145)
Total Bilirubin: 1.1 mg/dL (ref 0.3–1.2)
Total Protein: 6.5 g/dL (ref 6.5–8.1)

## 2021-09-04 MED ORDER — SODIUM CHLORIDE 0.9 % IV SOLN
150.0000 mg/m2 | Freq: Once | INTRAVENOUS | Status: AC
  Administered 2021-09-04: 300 mg via INTRAVENOUS
  Filled 2021-09-04: qty 15

## 2021-09-04 MED ORDER — ATROPINE SULFATE 1 MG/ML IV SOLN
0.5000 mg | Freq: Once | INTRAVENOUS | Status: AC | PRN
  Administered 2021-09-04: 0.5 mg via INTRAVENOUS
  Filled 2021-09-04: qty 1

## 2021-09-04 MED ORDER — SODIUM CHLORIDE 0.9 % IV SOLN
10.0000 mg/kg | Freq: Once | INTRAVENOUS | Status: AC
  Administered 2021-09-04: 800 mg via INTRAVENOUS
  Filled 2021-09-04: qty 32

## 2021-09-04 MED ORDER — PALONOSETRON HCL INJECTION 0.25 MG/5ML
0.2500 mg | Freq: Once | INTRAVENOUS | Status: AC
  Administered 2021-09-04: 0.25 mg via INTRAVENOUS
  Filled 2021-09-04: qty 5

## 2021-09-04 MED ORDER — SODIUM CHLORIDE 0.9 % IV SOLN
2400.0000 mg/m2 | INTRAVENOUS | Status: DC
  Administered 2021-09-04: 4650 mg via INTRAVENOUS
  Filled 2021-09-04: qty 93

## 2021-09-04 MED ORDER — SODIUM CHLORIDE 0.9% FLUSH
10.0000 mL | INTRAVENOUS | Status: DC | PRN
  Administered 2021-09-04: 10 mL
  Filled 2021-09-04: qty 10

## 2021-09-04 MED ORDER — SODIUM CHLORIDE 0.9 % IV SOLN
Freq: Once | INTRAVENOUS | Status: AC
  Filled 2021-09-04: qty 250

## 2021-09-04 MED ORDER — SODIUM CHLORIDE 0.9 % IV SOLN
10.0000 mg | Freq: Once | INTRAVENOUS | Status: AC
  Administered 2021-09-04: 10 mg via INTRAVENOUS
  Filled 2021-09-04: qty 1

## 2021-09-04 NOTE — Assessment & Plan Note (Addendum)
#  STAGE IV-[N-RAS MUTATED] Synchronous primaries a] rectal cancer-10 cm-adenocarcinoma & b] transverse colon-- intramucosal adenoca]; abdominal lymphadenopathy; liver metastases; omental metastases. AUG 1st, 2023- CT CAP-continued increasing multifocal bilateral pulmonary metastasis; no progression of disease anywhere else unchanged wall thickening involving the ascending colon/rectum.  Discontinue 5-FU plus Bev.  Recommend switching to FOLFIRI+ m-Vasi.    #Proceed with cycle #1 of FOLFIRI+M-Vasi. Labs today reviewed;  acceptable for treatment today.  Discussed the potential risk of nausea vomiting along with diarrhea.  Called in prescription for Lomotil.  We will have the patient take Lomotil 30 to 60 minutes before treatment.   # Anemia/iron deficiency hemoglobin ~13-  secondary to chronic GI bleeding/tumor-STABLE  # Thrombocytopenia/secondary chemotherapy/on aspirin-platelets- 80-90-monitor closely- STABLE  # HTN- 781/94; at home BP reviewed- 130-150/ DBP80-90s.continue checking BP at home/reviewed the log from home.  Proceed with M-vasi today.STABLE  # PN G-2-3 sec to Oxaliplatin [last 04/02/2021]. discontinued oxaliplatin for now. Poor tolerance to cymbalta 30 mg q day.STABLE  # Chronic kidney disease stage III-[GFR-40 ] -stable.  Continue keeping up with hydration.STABLE  * CEA- not marker;  # DISPOSITION:   # FOLFIRI +m-vasi today; pump off after 48 hours - wait for CMP to result; no need to wait for UA  # Follow up in 2 weeks- MD: labs- cbc/cmp;UA  FOLFIRI + M-vasi-; Pump off 48 hours later- Dr.B

## 2021-09-04 NOTE — Patient Instructions (Signed)
[facility] CANCER CTR AT [place]  Discharge Instructions: Thank you for choosing [facility] to provide your oncology and hematology care.  If you have a lab appointment with the [facility], please go directly to the [facility] and check in at the registration area.  Wear comfortable clothing and clothing appropriate for easy access to any Portacath or PICC line.   We strive to give you quality time with your provider. You may need to reschedule your appointment if you arrive late (15 or more minutes).  Arriving late affects you and other patients whose appointments are after yours.  Also, if you miss three or more appointments without notifying the office, you may be dismissed from the clinic at the provider's discretion.      For prescription refill requests, have your pharmacy contact our office and allow 72 hours for refills to be completed.    Today you received the following chemotherapy and/or immunotherapy agents: Avastin, Irinotecan, Adrucil      To help prevent nausea and vomiting after your treatment, we encourage you to take your nausea medication as directed.  BELOW ARE SYMPTOMS THAT SHOULD BE REPORTED IMMEDIATELY: *FEVER GREATER THAN 100.4 F (38 C) OR HIGHER *CHILLS OR SWEATING *NAUSEA AND VOMITING THAT IS NOT CONTROLLED WITH YOUR NAUSEA MEDICATION *UNUSUAL SHORTNESS OF BREATH *UNUSUAL BRUISING OR BLEEDING *URINARY PROBLEMS (pain or burning when urinating, or frequent urination) *BOWEL PROBLEMS (unusual diarrhea, constipation, pain near the anus) TENDERNESS IN MOUTH AND THROAT WITH OR WITHOUT PRESENCE OF ULCERS (sore throat, sores in mouth, or a toothache) UNUSUAL RASH, SWELLING OR PAIN  UNUSUAL VAGINAL DISCHARGE OR ITCHING   Items with * indicate a potential emergency and should be followed up as soon as possible or go to the Emergency Department if any problems should occur.  Please show the CHEMOTHERAPY ALERT CARD or IMMUNOTHERAPY ALERT  CARD at check-in to the Emergency Department and triage nurse.  Should you have questions after your visit or need to cancel or reschedule your appointment, please contact [facility] CANCER [place] AT [place]  [phone] and follow the prompts.  Office hours are 8:00 a.m. to 4:30 p.m. Monday - Friday. Please note that voicemails left after 4:00 p.m. may not be returned until the following business day.  We are closed weekends and major holidays. You have access to a nurse at all times for urgent questions. Please call the main number to the clinic [phone] and follow the prompts.  For any non-urgent questions, you may also contact your provider using MyChart. We now offer e-Visits for anyone 13 and older to request care online for non-urgent symptoms. For details visit [email or web address].   Also download the MyChart app! Go to the app store, search "MyChart", open the app, select [facility], and log in with your MyChart username and password.  Masks are optional in the cancer centers. If you would like for your care team to wear a mask while they are taking care of you, please let them know. For doctor visits, patients may have with them one support person who is at least 78 years old. At this time, visitors are not allowed in the infusion area.

## 2021-09-04 NOTE — Progress Notes (Signed)
Pt has some spots on his rt inner leg he would like you to look at, x4-5days.

## 2021-09-04 NOTE — Progress Notes (Signed)
[place] NOTE  Patient Care Team: [name], MD as PCP - General (Family Medicine) Clent Jacks, RN as Oncology Nurse Navigator Cammie Sickle, MD as Consulting Physician (Oncology)  CHIEF COMPLAINTS/PURPOSE OF CONSULTATION: colon/rectal cancer #  Oncology History Overview Note  # Malignant partially obstructing tumor in the transverse colon/70 cm proximal to the anus- C. COLON MASS, 70 CM; COLD BIOPSY:  - INTRAMUCOSAL ADENOCARCINOMA AT LEAST;  # One 20 mm polyp in the transverse colon, removed with mucosal resection. Resected and retrieved. Clips were placed. Tattooed.  # tumor in the mid rectum and at 10 cm proximal to the anus. Biopsied.    SEE COMMENT.   Comment:  There is no definitive evidence of invasion in this sample.  The  findings may not accurately represent the entire underlying lesion;  clinical correlation is recommended.   D. COLON POLYP, TRANSVERSE; HOT SNARE:  - TUBULOVILLOUS ADENOMA.  - NEGATIVE FOR HIGH GRADE DYSPLASIA AND MALIGNANCY.   E. RECTUM MASS; COLD BIOPSY:  - INVASIVE ADENOCARCINOMA, MODERATELY TO POORLY DIFFERENTIATED. ----------------------------------   # PET scan: SEP 2022-proximal right colon [adjacent mesenteric lymph nodes] and rectal hypermetabolism.  Additional subcentimeter bilateral hypermetabolic lymphadenopathy noted in the retroperitoneal/left external iliac; inferior right lobe of the liver concerning for metastatic disease; right lower quadrant soft tissue mass concerning for peritoneal carcinomatosis; bilateral solid pulmonary nodules ~5 mm; MRI rectum- T stage: T4a; N stage:  N1.   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 No pathogenic variants identified on the [facility] Multi-Cancer+RNA Panel. The report date is 11/05/2020.  The Multi-Cancer Panel + RNA offered by [facility] includes sequencing and/or deletion duplication testing of the following 84 genes: AIP, ALK, APC, ATM, AXIN2,BAP1,  BARD1, BLM, BMPR1A, BRCA1, BRCA2, BRIP1, CASR, CDC73, CDH1, CDK4, CDKN1B, CDKN1C, CDKN2A (p14ARF), CDKN2A (p16INK4a), CEBPA, CHEK2, CTNNA1, DICER1, DIS3L2, EGFR (c.2369C>T, p.Thr790Met variant only), EPCAM (Deletion/duplication testing only), FH, FLCN, GATA2, GPC3, GREM1 (Promoter region deletion/duplication testing only), HOXB13 (c.251G>A, p.Gly84Glu), HRAS, KIT, MAX, MEN1, MET, MITF (c.952G>A, p.Glu318Lys variant only), MLH1, MSH2, MSH3, MSH6, MUTYH, NBN, NF1, NF2, NTHL1, PALB2, PDGFRA, PHOX2B, PMS2, POLD1, POLE, POT1, PRKAR1A, PTCH1, PTEN, RAD50, RAD51C, RAD51D, RB1, RECQL4, RET, RUNX1, SDHAF2, SDHA (sequence changes only), SDHB, SDHC, SDHD, SMAD4, SMARCA4, SMARCB1, SMARCE1, STK11, SUFU, TERC, TERT, TMEM127, TP53, TSC1, TSC2, VHL, WRN and WT1.   09/04/2021 -  Chemotherapy   Patient is on Treatment Plan : COLORECTAL FOLFIRI + Bevacizumab q14d       HISTORY OF PRESENTING ILLNESS: Ambulating independently.  Accompanied by daughter.  [name] 76 y.o.  male synchronous  colon  cancer/rectal cancer-stage IV-on 5 CIV FU+Avastin-in August 2020 noted to have progression on imaging.  Patient is here for follow-up/to proceed with FOLIRI + ZiraBev.  Continues to have intermittent mild epistaxis-resolved itself. Mild tingling and numbness in extremities and fingertips not any worse.  No falls.   No headaches.    No diarrhea.  No constipation.  Complains of mild fatigue.   Review of Systems  Constitutional:  Negative for chills, diaphoresis, fever and weight loss.  HENT:  Negative for nosebleeds and sore throat.   Eyes:  Negative for double vision.  Respiratory:  Negative for cough, hemoptysis, sputum production, shortness of breath and wheezing.   Cardiovascular:  Negative for chest pain, palpitations, orthopnea and leg swelling.  Gastrointestinal:  Negative for abdominal pain, blood in stool, constipation, diarrhea, heartburn, melena, nausea and vomiting.  Genitourinary:  Negative for dysuria, frequency and urgency.  Skin: Negative.  Negative for itching and rash.  Neurological:  Positive for tingling and sensory change. Negative for dizziness, focal weakness, weakness and headaches.  Endo/Heme/Allergies:  Does not bruise/bleed easily.  Psychiatric/Behavioral:  Negative for depression. The patient is not nervous/anxious and does not have insomnia.      MEDICAL HISTORY:  Past Medical History:  Diagnosis Date   CAD (coronary artery disease) 06/2014   UA with NSTEMI - cath with 99% prox L circ s/p stent, EF 40% ([place], [place] at [facility])   Emphysema    mild   Ex-smoker    Family history of breast cancer    Family history of colon cancer    Family history of pancreatic cancer    NSTEMI (non-ST elevated myocardial infarction) ([place]) 07/13/2014    SURGICAL HISTORY: Past Surgical History:  Procedure Laterality Date   CARDIAC CATHETERIZATION N/A 07/14/2014   Left Heart Cath and Coronary Angiography with stent placement;  Surgeon: Teodoro Spray, MD   CARDIAC  CATHETERIZATION N/A 07/14/2014   Coronary Stent Intervention;  Surgeon: Yolonda Kida, MD   COLONOSCOPY WITH PROPOFOL N/A 09/06/2020   Procedure: COLONOSCOPY WITH PROPOFOL;  Surgeon: Lin Landsman, MD;  Location[facility] END[facility]COPY;  Service: Gastroenterology;  Laterality: N/A;   CYSTECTOMY     on back   ESOPHAGOGASTRODUODENOSCOPY N/A 09/06/2020   Procedure: ESOPHAGOGASTRODUODENOSCOPY (EGD);  Surgeon: Lin Landsman, MD;  Location: [facility] ENDOSCOPY;  Service: Gastroenterology;  Laterality: N/A;   ESOPHAGOGASTRODUODENOSCOPY N/A 06/29/2021   Procedure: ESOPHAGOGASTRODUODENOSCOPY (EGD);  Surgeon: Lin Landsman, MD;  Location: [facility] ENDOSCOPY;  Service: Gastroenterology;  Laterality: N/A;   INGUINAL HERNIA REPAIR Right 08/17/04   IR IMAGING GUIDED PORT INSERTION  09/13/2020    SOCIAL HISTORY: Social History   Socioeconomic History   Marital status: Married    Spouse name: Not on file   Number of children: Not on file   Years of education: Not on file   Highest education level: Not on file  Occupational History   Not on file  Tobacco Use   Smoking status: Former    Packs/day: 1.00    Years: 35.00    Total pack years: 35.00    Types: Cigarettes    Quit date: 07/07/1998    Years since quitting: 23.1   Smokeless tobacco: Never  Vaping Use   Vaping Use: Never used  Substance and Sexual Activity   Alcohol use: No   Drug use: No   Sexual activity: Yes  Other Topics Concern   Not on file  Social History Narrative   Caffeine: 2 cups coffee, 2 cups tea/day  Lives with wife   Occupation: Higher education careers adviser, retired   Edu: HS   Activity: works in yard, walking   Diet: some water, fruits/vegetables daily   -------------------------------------------------------------------       [employer] [place]; [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    Social Determinants of Health   Financial Resource Strain: Low Risk   (06/16/2020)   Overall Financial Resource Strain (CARDIA)    Difficulty of Paying Living Expenses: Not hard at all  Food Insecurity: No Food Insecurity (06/16/2020)   Hunger Vital Sign    Worried About Running Out of Food in the Last Year: Never true    Ran Out of Food in the Last Year: Never true  Transportation Needs: No Transportation Needs (06/16/2020)   PRAPARE - Hydrologist (Medical): No    Lack of Transportation (Non-Medical): No  Physical Activity: Inactive (06/16/2020)   Exercise Vital Sign    Days of Exercise per Week: 0 days    Minutes of Exercise per Session: 0 min  Stress: No Stress Concern Present (06/16/2020)   [place]    Feeling of Stress : Not at all  Social Connections: Not on file  Intimate Partner Violence: Not At Risk (06/16/2020)   Humiliation, Afraid, Rape, and Kick questionnaire    Fear of Current or Ex-Partner: No    Emotionally Abused: No    Physically Abused: No    Sexually Abused: No    FAMILY HISTORY: Family History  Problem Relation Age of Onset   Cancer Mother 57       colon   Cancer Sister        breast   Crohn's disease Sister    Cancer Daughter        anal   Crohn's disease Niece    CAD Neg Hx    Stroke Neg Hx    Diabetes Neg Hx    Prostate cancer Neg Hx    Kidney cancer Neg Hx    Bladder Cancer Neg Hx     ALLERGIES:  has No Known Allergies.  MEDICATIONS:  Current Outpatient Medications  Medication Sig Dispense Refill   aspirin EC 81 MG tablet Take 1 tablet (81 mg total) by mouth daily. 60 tablet 1   atorvastatin (LIPITOR) 40 MG tablet Take 1 tablet (40 mg total) by mouth daily at 6 PM. 60 tablet 1   Cholecalciferol (VITAMIN D3) 25 MCG (1000 UT) CAPS Take 1 capsule (1,000 Units total) by mouth daily. 30 capsule    Cyanocobalamin (B-12) 1000 MCG SUBL Place 1 tablet under the tongue daily.     diphenoxylate-atropine (LOMOTIL) 2.5-0.025 MG tablet  Take 1 tablet by mouth 4 (four) times daily as needed for diarrhea or loose stools. Take it along with immodium 60 tablet 0   DULoxetine (CYMBALTA) 30 MG capsule Take 1 capsule (30 mg total) by mouth daily. 30 capsule 3   lidocaine-prilocaine (EMLA) cream Apply 1 application topically as needed (to port a cath 1 hour prior to each chemotherapy). 30 g 3   metoprolol tartrate (LOPRESSOR) 25 MG tablet Take 1 tablet (25 mg total) by mouth 2 (two) times daily. 60 tablet 1   ondansetron (ZOFRAN) 8 MG tablet Take 1 tablet (8 mg total) by mouth every 8 (eight) hours as needed for nausea or vomiting. 20 tablet 3   pantoprazole (PROTONIX) 40 MG tablet TAKE 1  TABLET BY MOUTH TWICE DAILY BEFORE A MEAL 60 tablet 11   prochlorperazine (COMPAZINE) 10 MG tablet Take 1 tablet (10 mg total) by mouth every 6 (six) hours as needed for nausea or vomiting. 30 tablet 3   tadalafil (CIALIS) 20 MG tablet TAKE ONE TABLET BY MOUTH DAILY AS NEEDED FOR ERECTILE DYSFUNCTION 20 tablet 3   ferrous sulfate 324 (65 Fe) MG TBEC Take 1 tablet (325 mg total) by mouth every other day.     No current facility-administered medications for this visit.   Facility-Administered Medications Ordered in Other Visits  Medication Dose Route Frequency Provider Last Rate Last Admin   fluorouracil (ADRUCIL) 4,650 mg in sodium chloride 0.9 % 57 mL chemo infusion  2,400 mg/m2 (Treatment Plan Recorded) Intravenous 1 day or 1 dose Charlaine Dalton R, MD   Infusion Verify at 09/04/21 1319   sodium chloride flush (NS) 0.9 % injection 10 mL  10 mL Intravenous PRN Charlaine Dalton R, MD   10 mL at 10/09/20 0855   sodium chloride flush (NS) 0.9 % injection 10 mL  10 mL Intracatheter PRN Cammie Sickle, MD   10 mL at 09/04/21 1313   .  PHYSICAL EXAMINATION: ECOG PERFORMANCE STATUS: 0 - Asymptomatic  Vitals:   09/04/21 0844  BP: (!) 175/93  Pulse: 61  Temp: (!) 96.3 F (35.7 C)  SpO2: 99%     Filed Weights   09/04/21 0844  Weight:  171 lb 12.8 oz (77.9 kg)    Physical Exam Vitals and nursing note reviewed.  HENT:     Head: Normocephalic and atraumatic.     Mouth/Throat:     Pharynx: Oropharynx is clear.  Eyes:     Extraocular Movements: Extraocular movements intact.     Pupils: Pupils are equal, round, and reactive to light.  Cardiovascular:     Rate and Rhythm: Normal rate and regular rhythm.  Pulmonary:     Comments: Decreased breath sounds bilaterally.  Abdominal:     Palpations: Abdomen is soft.  Musculoskeletal:        General: Normal range of motion.     Cervical back: Normal range of motion.  Skin:    General: Skin is warm.     Findings: Bruising present.  Neurological:     General: No focal deficit present.     Mental Status: He is alert and oriented to person, place, and time.  Psychiatric:        Behavior: Behavior normal.        Judgment: Judgment normal.    LABORATORY DATA:  I have reviewed the data as listed Lab Results  Component Value Date   WBC 7.4 09/04/2021   HGB 15.3 09/04/2021   HCT 46.2 09/04/2021   MCV 102.9 (H) 09/04/2021   PLT 90 (L) 09/04/2021   Recent Labs    08/07/21 0817 08/21/21 0811 09/04/21 0848  NA 136 137 137  K 4.1 4.0 4.2  CL 110 107 106  CO2 $Re'23 24 24  'UTN$ GLUCOSE 130* 156* 133*  BUN $Re'19 15 17  'vFa$ CREATININE 1.71* 1.68* 1.71*  CALCIUM 8.4* 8.6* 8.9  GFRNONAA 40* 41* 40*  PROT 6.5 6.1* 6.5  ALBUMIN 3.3* 3.3* 3.4*  AST 38 34 32  ALT $Re'30 28 28  'Vzv$ ALKPHOS 216* 227* 227*  BILITOT 1.0 0.7 1.1  No results found for: "CEA"   RADIOGRAPHIC STUDIES: I have personally reviewed the radiological images as listed and agreed with the findings in the report.   ASSESSMENT &  PLAN:   Rectal cancer ([place])  # STAGE IV-[N-RAS MUTATED] Synchronous primaries a] rectal cancer-10 cm-adenocarcinoma & b] transverse colon-- intramucosal adenoca]; abdominal lymphadenopathy; liver metastases; omental metastases. AUG 1st, 2023- CT CAP-continued increasing multifocal bilateral  pulmonary metastasis; no progression of disease anywhere else unchanged wall thickening involving the ascending colon/rectum.  Discontinue 5-FU plus Bev.  Recommend switching to FOLFIRI+ m-Vasi.    #Proceed with cycle #1 of FOLFIRI+M-Vasi. Labs today reviewed;  acceptable for treatment today.  Discussed the potential risk of nausea vomiting along with diarrhea.  Called in prescription for Lomotil.  We will have the patient take Lomotil 30 to 60 minutes before treatment.   # Anemia/iron deficiency hemoglobin ~13-  secondary to chronic GI bleeding/tumor-STABLE  # Thrombocytopenia/secondary chemotherapy/on aspirin-platelets- 80-90-monitor closely- STABLE  # HTN- 781/94; at home BP reviewed- 130-150/ DBP80-90s.continue checking BP at home/reviewed the log from home.  Proceed with M-vasi today.STABLE  # PN G-2-3 sec to Oxaliplatin [last 04/02/2021]. discontinued oxaliplatin for now. Poor tolerance to cymbalta 30 mg q day.STABLE  # Chronic kidney disease stage III-[GFR-40 ] -stable.  Continue keeping up with hydration.STABLE  * CEA- not marker;  # DISPOSITION:   # FOLFIRI +m-vasi today; pump off after 48 hours - wait for CMP to result; no need to wait for UA  # Follow up in 2 weeks- MD: labs- cbc/cmp;UA  FOLFIRI + M-vasi-; Pump off 48 hours later- Dr.B   Rectal cancer ([facility])Gianna Calef Ann Lions, MD 09/04/2021   CC: Dr. Rogue Bussing

## 2021-09-05 ENCOUNTER — Other Ambulatory Visit: Payer: Self-pay

## 2021-09-06 ENCOUNTER — Inpatient Hospital Stay: Payer: Medicare HMO

## 2021-09-06 VITALS — BP 164/94 | HR 62 | Resp 18

## 2021-09-06 DIAGNOSIS — I10 Essential (primary) hypertension: Secondary | ICD-10-CM | POA: Diagnosis not present

## 2021-09-06 DIAGNOSIS — C20 Malignant neoplasm of rectum: Secondary | ICD-10-CM

## 2021-09-06 DIAGNOSIS — Z79899 Other long term (current) drug therapy: Secondary | ICD-10-CM | POA: Diagnosis not present

## 2021-09-06 DIAGNOSIS — D649 Anemia, unspecified: Secondary | ICD-10-CM | POA: Diagnosis not present

## 2021-09-06 DIAGNOSIS — C786 Secondary malignant neoplasm of retroperitoneum and peritoneum: Secondary | ICD-10-CM | POA: Diagnosis not present

## 2021-09-06 DIAGNOSIS — N183 Chronic kidney disease, stage 3 unspecified: Secondary | ICD-10-CM | POA: Diagnosis not present

## 2021-09-06 DIAGNOSIS — D696 Thrombocytopenia, unspecified: Secondary | ICD-10-CM | POA: Diagnosis not present

## 2021-09-06 DIAGNOSIS — C787 Secondary malignant neoplasm of liver and intrahepatic bile duct: Secondary | ICD-10-CM | POA: Diagnosis not present

## 2021-09-06 DIAGNOSIS — C7802 Secondary malignant neoplasm of left lung: Secondary | ICD-10-CM | POA: Diagnosis not present

## 2021-09-06 DIAGNOSIS — C7801 Secondary malignant neoplasm of right lung: Secondary | ICD-10-CM | POA: Diagnosis not present

## 2021-09-06 DIAGNOSIS — Z5111 Encounter for antineoplastic chemotherapy: Secondary | ICD-10-CM | POA: Diagnosis not present

## 2021-09-06 MED ORDER — HEPARIN SOD (PORK) LOCK FLUSH 100 UNIT/ML IV SOLN
500.0000 [IU] | Freq: Once | INTRAVENOUS | Status: AC | PRN
  Administered 2021-09-06: 500 [IU]
  Filled 2021-09-06: qty 5

## 2021-09-06 MED ORDER — SODIUM CHLORIDE 0.9% FLUSH
10.0000 mL | INTRAVENOUS | Status: DC | PRN
  Administered 2021-09-06: 10 mL
  Filled 2021-09-06: qty 10

## 2021-09-18 ENCOUNTER — Encounter: Payer: Self-pay | Admitting: Internal Medicine

## 2021-09-18 ENCOUNTER — Inpatient Hospital Stay: Payer: Medicare HMO | Attending: Internal Medicine | Admitting: Internal Medicine

## 2021-09-18 ENCOUNTER — Inpatient Hospital Stay (HOSPITAL_BASED_OUTPATIENT_CLINIC_OR_DEPARTMENT_OTHER): Payer: Medicare HMO | Admitting: *Deleted

## 2021-09-18 ENCOUNTER — Inpatient Hospital Stay: Payer: Medicare HMO

## 2021-09-18 VITALS — BP 152/91 | HR 68 | Temp 96.4°F | Resp 18 | Ht 68.0 in | Wt 170.4 lb

## 2021-09-18 DIAGNOSIS — D509 Iron deficiency anemia, unspecified: Secondary | ICD-10-CM | POA: Diagnosis not present

## 2021-09-18 DIAGNOSIS — C20 Malignant neoplasm of rectum: Secondary | ICD-10-CM

## 2021-09-18 DIAGNOSIS — Z5111 Encounter for antineoplastic chemotherapy: Secondary | ICD-10-CM | POA: Diagnosis present

## 2021-09-18 DIAGNOSIS — C7802 Secondary malignant neoplasm of left lung: Secondary | ICD-10-CM | POA: Diagnosis not present

## 2021-09-18 DIAGNOSIS — N183 Chronic kidney disease, stage 3 unspecified: Secondary | ICD-10-CM | POA: Insufficient documentation

## 2021-09-18 DIAGNOSIS — C786 Secondary malignant neoplasm of retroperitoneum and peritoneum: Secondary | ICD-10-CM | POA: Insufficient documentation

## 2021-09-18 DIAGNOSIS — C184 Malignant neoplasm of transverse colon: Secondary | ICD-10-CM | POA: Diagnosis not present

## 2021-09-18 DIAGNOSIS — D696 Thrombocytopenia, unspecified: Secondary | ICD-10-CM | POA: Diagnosis not present

## 2021-09-18 DIAGNOSIS — Z79899 Other long term (current) drug therapy: Secondary | ICD-10-CM | POA: Diagnosis not present

## 2021-09-18 DIAGNOSIS — C787 Secondary malignant neoplasm of liver and intrahepatic bile duct: Secondary | ICD-10-CM | POA: Insufficient documentation

## 2021-09-18 DIAGNOSIS — D701 Agranulocytosis secondary to cancer chemotherapy: Secondary | ICD-10-CM | POA: Insufficient documentation

## 2021-09-18 LAB — CBC WITH DIFFERENTIAL/PLATELET
Abs Immature Granulocytes: 0.01 10*3/uL (ref 0.00–0.07)
Basophils Absolute: 0.1 10*3/uL (ref 0.0–0.1)
Basophils Relative: 2 %
Eosinophils Absolute: 0.5 10*3/uL (ref 0.0–0.5)
Eosinophils Relative: 12 %
HCT: 42.7 % (ref 39.0–52.0)
Hemoglobin: 14 g/dL (ref 13.0–17.0)
Immature Granulocytes: 0 %
Lymphocytes Relative: 39 %
Lymphs Abs: 1.5 10*3/uL (ref 0.7–4.0)
MCH: 33.3 pg (ref 26.0–34.0)
MCHC: 32.8 g/dL (ref 30.0–36.0)
MCV: 101.4 fL — ABNORMAL HIGH (ref 80.0–100.0)
Monocytes Absolute: 0.6 10*3/uL (ref 0.1–1.0)
Monocytes Relative: 15 %
Neutro Abs: 1.3 10*3/uL — ABNORMAL LOW (ref 1.7–7.7)
Neutrophils Relative %: 32 %
Platelets: 117 10*3/uL — ABNORMAL LOW (ref 150–400)
RBC: 4.21 MIL/uL — ABNORMAL LOW (ref 4.22–5.81)
RDW: 15.8 % — ABNORMAL HIGH (ref 11.5–15.5)
WBC: 3.9 10*3/uL — ABNORMAL LOW (ref 4.0–10.5)
nRBC: 0 % (ref 0.0–0.2)

## 2021-09-18 LAB — URINALYSIS, COMPLETE (UACMP) WITH MICROSCOPIC
Bacteria, UA: NONE SEEN
Bilirubin Urine: NEGATIVE
Glucose, UA: NEGATIVE mg/dL
Hgb urine dipstick: NEGATIVE
Ketones, ur: NEGATIVE mg/dL
Leukocytes,Ua: NEGATIVE
Nitrite: NEGATIVE
Protein, ur: NEGATIVE mg/dL
Specific Gravity, Urine: 1.013 (ref 1.005–1.030)
Squamous Epithelial / HPF: NONE SEEN (ref 0–5)
pH: 5 (ref 5.0–8.0)

## 2021-09-18 LAB — COMPREHENSIVE METABOLIC PANEL
ALT: 33 U/L (ref 0–44)
AST: 35 U/L (ref 15–41)
Albumin: 3.2 g/dL — ABNORMAL LOW (ref 3.5–5.0)
Alkaline Phosphatase: 229 U/L — ABNORMAL HIGH (ref 38–126)
Anion gap: 8 (ref 5–15)
BUN: 16 mg/dL (ref 8–23)
CO2: 23 mmol/L (ref 22–32)
Calcium: 8.4 mg/dL — ABNORMAL LOW (ref 8.9–10.3)
Chloride: 104 mmol/L (ref 98–111)
Creatinine, Ser: 1.64 mg/dL — ABNORMAL HIGH (ref 0.61–1.24)
GFR, Estimated: 43 mL/min — ABNORMAL LOW (ref >60)
Glucose, Bld: 99 mg/dL (ref 70–99)
Potassium: 4.2 mmol/L (ref 3.5–5.1)
Sodium: 135 mmol/L (ref 135–145)
Total Bilirubin: 0.7 mg/dL (ref 0.3–1.2)
Total Protein: 6.4 g/dL — ABNORMAL LOW (ref 6.5–8.1)

## 2021-09-18 MED ORDER — SODIUM CHLORIDE 0.9 % IV SOLN
Freq: Once | INTRAVENOUS | Status: AC
  Filled 2021-09-18: qty 250

## 2021-09-18 MED ORDER — SODIUM CHLORIDE 0.9 % IV SOLN
2400.0000 mg/m2 | INTRAVENOUS | Status: AC
  Administered 2021-09-18: 4650 mg via INTRAVENOUS
  Filled 2021-09-18: qty 93

## 2021-09-18 MED ORDER — SODIUM CHLORIDE 0.9 % IV SOLN
10.0000 mg | Freq: Once | INTRAVENOUS | Status: AC
  Administered 2021-09-18: 10 mg via INTRAVENOUS
  Filled 2021-09-18: qty 10

## 2021-09-18 MED ORDER — TRIAMCINOLONE ACETONIDE 0.5 % EX OINT: 1.0000 | TOPICAL_OINTMENT | Freq: Two times a day (BID) | CUTANEOUS | 0 refills | Status: DC

## 2021-09-18 MED ORDER — SODIUM CHLORIDE 0.9 % IV SOLN
5.0000 mg/kg | Freq: Once | INTRAVENOUS | Status: AC
  Administered 2021-09-18: 400 mg via INTRAVENOUS
  Filled 2021-09-18: qty 16

## 2021-09-18 MED ORDER — SODIUM CHLORIDE 0.9 % IV SOLN
150.0000 mg/m2 | Freq: Once | INTRAVENOUS | Status: AC
  Administered 2021-09-18: 300 mg via INTRAVENOUS
  Filled 2021-09-18: qty 15

## 2021-09-18 MED ORDER — SODIUM CHLORIDE 0.9 % IV SOLN: 10.0000 mg/kg | Freq: Once | INTRAVENOUS | Status: DC

## 2021-09-18 MED ORDER — PALONOSETRON HCL INJECTION 0.25 MG/5ML
0.2500 mg | Freq: Once | INTRAVENOUS | Status: AC
  Administered 2021-09-18: 0.25 mg via INTRAVENOUS
  Filled 2021-09-18: qty 5

## 2021-09-18 NOTE — Progress Notes (Signed)
Verified Mvasi dose with MD.  Changed to '5mg'$ /kg

## 2021-09-18 NOTE — Progress Notes (Signed)
Rash in inner thigh has improved but after last treatment there were more spots on his legs.

## 2021-09-18 NOTE — Progress Notes (Signed)
[place] NOTE  Patient Care Team: [name], MD as PCP - General (Family Medicine) Clent Jacks, RN as Oncology Nurse Navigator Cammie Sickle, MD as Consulting Physician (Oncology)  CHIEF COMPLAINTS/PURPOSE OF CONSULTATION: colon/rectal cancer #  Oncology History Overview Note  # Malignant partially obstructing tumor in the transverse colon/70 cm proximal to the anus- C. COLON MASS, 70 CM; COLD BIOPSY:  - INTRAMUCOSAL ADENOCARCINOMA AT LEAST;  # One 20 mm polyp in the transverse colon, removed with mucosal resection. Resected and retrieved. Clips were placed. Tattooed.  # tumor in the mid rectum and at 10 cm proximal to the anus. Biopsied.    SEE COMMENT.   Comment:  There is no definitive evidence of invasion in this sample.  The  findings may not accurately represent the entire underlying lesion;  clinical correlation is recommended.   D. COLON POLYP, TRANSVERSE; HOT SNARE:  - TUBULOVILLOUS ADENOMA.  - NEGATIVE FOR HIGH GRADE DYSPLASIA AND MALIGNANCY.   E. RECTUM MASS; COLD BIOPSY:  - INVASIVE ADENOCARCINOMA, MODERATELY TO POORLY DIFFERENTIATED. ----------------------------------   # PET scan: SEP 2022-proximal right colon [adjacent mesenteric lymph nodes] and rectal hypermetabolism.  Additional subcentimeter bilateral hypermetabolic lymphadenopathy noted in the retroperitoneal/left external iliac; inferior right lobe of the liver concerning for metastatic disease; right lower quadrant soft tissue mass concerning for peritoneal carcinomatosis; bilateral solid pulmonary nodules ~5 mm; MRI rectum- T stage: T4a; N stage:  N1.   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 No pathogenic variants identified on the [facility] Multi-Cancer+RNA Panel. The report date is 11/05/2020.  The Multi-Cancer Panel + RNA offered by [facility] includes sequencing and/or deletion duplication testing of the following 84 genes: AIP, ALK, APC, ATM, AXIN2,BAP1,  BARD1, BLM, BMPR1A, BRCA1, BRCA2, BRIP1, CASR, CDC73, CDH1, CDK4, CDKN1B, CDKN1C, CDKN2A (p14ARF), CDKN2A (p16INK4a), CEBPA, CHEK2, CTNNA1, DICER1, DIS3L2, EGFR (c.2369C>T, p.Thr790Met variant only), EPCAM (Deletion/duplication testing only), FH, FLCN, GATA2, GPC3, GREM1 (Promoter region deletion/duplication testing only), HOXB13 (c.251G>A, p.Gly84Glu), HRAS, KIT, MAX, MEN1, MET, MITF (c.952G>A, p.Glu318Lys variant only), MLH1, MSH2, MSH3, MSH6, MUTYH, NBN, NF1, NF2, NTHL1, PALB2, PDGFRA, PHOX2B, PMS2, POLD1, POLE, POT1, PRKAR1A, PTCH1, PTEN, RAD50, RAD51C, RAD51D, RB1, RECQL4, RET, RUNX1, SDHAF2, SDHA (sequence changes only), SDHB, SDHC, SDHD, SMAD4, SMARCA4, SMARCB1, SMARCE1, STK11, SUFU, TERC, TERT, TMEM127, TP53, TSC1, TSC2, VHL, WRN and WT1.   09/04/2021 -  Chemotherapy   Patient is on Treatment Plan : COLORECTAL FOLFIRI + Bevacizumab q14d       HISTORY OF PRESENTING ILLNESS: Ambulating independently.  Accompanied by daughter.  [name] 76 y.o.  male synchronous  colon  cancer/rectal cancer-stage IV FOLIRI + ZiraBev is here for follow-up.  Complains of rash on Right leg.  Not itchy or significant painful.  It happens 1 or 2 days after the chemotherapy. Mild tingling and numbness in extremities and fingertips not any worse.  No falls.   No headaches.    No diarrhea.  No constipation or diarrhea.  Complains of mild fatigue.   Review of Systems  Constitutional:  Negative for chills, diaphoresis, fever and weight loss.  HENT:  Negative for nosebleeds and sore throat.   Eyes:  Negative for double vision.  Respiratory:  Negative for cough, hemoptysis, sputum production, shortness of breath and wheezing.   Cardiovascular:  Negative for chest pain, palpitations, orthopnea and leg swelling.  Gastrointestinal:  Negative for abdominal pain, blood in stool, constipation, diarrhea, heartburn, melena, nausea and vomiting.  Genitourinary:  Negative for dysuria, frequency and urgency.  Skin: Negative.  Negative for itching and rash.  Neurological:  Positive for tingling and sensory change. Negative for dizziness, focal weakness, weakness and headaches.  Endo/Heme/Allergies:  Does not bruise/bleed easily.  Psychiatric/Behavioral:  Negative for depression. The patient is not nervous/anxious and does not have insomnia.      MEDICAL HISTORY:  Past Medical History:  Diagnosis Date   CAD (coronary artery disease) 06/2014   UA with NSTEMI - cath with 99% prox L circ s/p stent, EF 40% ([place], [place] at [facility])   Emphysema    mild   Ex-smoker    Family history of breast cancer    Family history of colon cancer    Family history of pancreatic cancer    NSTEMI (non-ST elevated myocardial infarction) ([place]) 07/13/2014    SURGICAL HISTORY: Past Surgical History:  Procedure Laterality Date   CARDIAC CATHETERIZATION N/A 07/14/2014   Left Heart Cath and Coronary Angiography with stent placement;  Surgeon: Teodoro Spray, MD   CARDIAC CATHETERIZATION N/A 07/14/2014   Coronary  Stent Intervention;  Surgeon: Yolonda Kida, MD   COLONOSCOPY WITH PROPOFOL N/A 09/06/2020   Procedure: COLONOSCOPY WITH PROPOFOL;  Surgeon: Lin Landsman, MD;  Location: [facility] ENDOSCOPY;  Service: Gastroenterology;  Laterality: N/A;   CYSTECTOMY     on back   ESOPHAGOGASTRODUODENOSCOPY N/A 09/06/2020   Procedure: ESOPHAGOGASTRODUODENOSCOPY (EGD);  Surgeon: Lin Landsman, MD;  Location: [facility] ENDOSCOPY;  Service: Gastroenterology;  Laterality: N/A;   ESOPHAGOGASTRODUODENOSCOPY N/A 06/29/2021   Procedure: ESOPHAGOGASTRODUODENOSCOPY (EGD);  Surgeon: Lin Landsman, MD;  Location: [facility] ENDOSCOPY;  Service: Gastroenterology;  Laterality: N/A;   INGUINAL HERNIA REPAIR Right 08/17/04   IR IMAGING GUIDED PORT INSERTION  09/13/2020    SOCIAL HISTORY: Social History   Socioeconomic History   Marital status: Married    Spouse name: Not on file   Number of children: Not on file   Years of education: Not on file   Highest education level: Not on file  Occupational History   Not on file  Tobacco Use   Smoking status: Former    Packs/day: 1.00    Years: 35.00    Total pack years: 35.00    Types: Cigarettes    Quit date: 07/07/1998    Years since quitting: 23.2   Smokeless tobacco: Never  Vaping Use   Vaping Use: Never used  Substance and Sexual Activity   Alcohol use: No   Drug use: No   Sexual activity: Yes  Other Topics Concern   Not on file  Social History Narrative   Caffeine: 2 cups coffee, 2  cups tea/day   Lives with wife   Occupation: Higher education careers adviser, retired   Edu: HS   Activity: works in yard, walking   Diet: some water, fruits/vegetables daily   -------------------------------------------------------------------       [employer] [place]; [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    Social Determinants of Health   Financial Resource Strain: Low Risk  (06/16/2020)   Overall Financial Resource Strain  (CARDIA)    Difficulty of Paying Living Expenses: Not hard at all  Food Insecurity: No Food Insecurity (06/16/2020)   Hunger Vital Sign    Worried About Running Out of Food in the Last Year: Never true    Ran Out of Food in the Last Year: Never true  Transportation Needs: No Transportation Needs (06/16/2020)   PRAPARE - Hydrologist (Medical): No    Lack of Transportation (Non-Medical): No  Physical Activity: Inactive (06/16/2020)   Exercise Vital Sign    Days of Exercise per Week: 0 days    Minutes of Exercise per Session: 0 min  Stress: No Stress Concern Present (06/16/2020)   [place]    Feeling of Stress : Not at all  Social Connections: Not on file  Intimate Partner Violence: Not At Risk (06/16/2020)   Humiliation, Afraid, Rape, and Kick questionnaire    Fear of Current or Ex-Partner: No    Emotionally Abused: No    Physically Abused: No    Sexually Abused: No    FAMILY HISTORY: Family History  Problem Relation Age of Onset   Cancer Mother 68       colon   Cancer Sister        breast   Crohn's disease Sister    Cancer Daughter        anal   Crohn's disease Niece    CAD Neg Hx    Stroke Neg Hx    Diabetes Neg Hx    Prostate cancer Neg Hx    Kidney cancer Neg Hx    Bladder Cancer Neg Hx     ALLERGIES:  has No Known Allergies.  MEDICATIONS:  Current Outpatient Medications  Medication Sig Dispense Refill   aspirin EC 81 MG tablet Take 1 tablet (81 mg total) by mouth daily. 60 tablet 1   atorvastatin (LIPITOR) 40 MG tablet Take 1 tablet (40 mg total) by mouth daily at 6 PM. 60 tablet 1   Cholecalciferol (VITAMIN D3) 25 MCG (1000 UT) CAPS Take 1 capsule (1,000 Units total) by mouth daily. 30 capsule    Cyanocobalamin (B-12) 1000 MCG SUBL Place 1 tablet under the tongue daily.     DULoxetine (CYMBALTA) 30 MG capsule Take 1 capsule (30 mg total) by mouth daily. 30 capsule 3    ferrous sulfate 324 (65 Fe) MG TBEC Take 1 tablet (325 mg total) by mouth every other day.     lidocaine-prilocaine (EMLA) cream Apply 1 application topically as needed (to port a cath 1 hour prior to each chemotherapy). 30 g 3   metoprolol tartrate (LOPRESSOR) 25 MG tablet Take 1 tablet (25 mg total) by mouth 2 (two) times daily. 60 tablet 1   ondansetron (ZOFRAN) 8 MG tablet Take 1 tablet (8 mg total) by mouth every 8 (eight) hours as needed for nausea or vomiting. 20 tablet 3   pantoprazole (PROTONIX) 40 MG tablet TAKE 1 TABLET BY MOUTH TWICE DAILY  BEFORE A MEAL 60 tablet 11   prochlorperazine (COMPAZINE) 10 MG tablet Take 1 tablet (10 mg total) by mouth every 6 (six) hours as needed for nausea or vomiting. 30 tablet 3   tadalafil (CIALIS) 20 MG tablet TAKE ONE TABLET BY MOUTH DAILY AS NEEDED FOR ERECTILE DYSFUNCTION 20 tablet 3   triamcinolone ointment (KENALOG) 0.5 % Apply 1 Application topically 2 (two) times daily. 30 g 0   diphenoxylate-atropine (LOMOTIL) 2.5-0.025 MG tablet Take 1 tablet by mouth 4 (four) times daily as needed for diarrhea or loose stools. Take it along with immodium (Patient not taking: Reported on 09/18/2021) 60 tablet 0   No current facility-administered medications for this visit.   Facility-Administered Medications Ordered in Other Visits  Medication Dose Route Frequency Provider Last Rate Last Admin   sodium chloride flush (NS) 0.9 % injection 10 mL  10 mL Intravenous PRN Cammie Sickle, MD   10 mL at 10/09/20 0855   .  PHYSICAL EXAMINATION: ECOG PERFORMANCE STATUS: 0 - Asymptomatic  Vitals:   09/18/21 0900  BP: (!) 152/91  Pulse: 68  Resp: 18  Temp: (!) 96.4 F (35.8 C)     Filed Weights   09/18/21 0900  Weight: 170 lb 6.4 oz (77.3 kg)    Physical Exam Vitals and nursing note reviewed.  HENT:     Head: Normocephalic and atraumatic.     Mouth/Throat:     Pharynx: Oropharynx is clear.  Eyes:     Extraocular Movements: Extraocular  movements intact.     Pupils: Pupils are equal, round, and reactive to light.  Cardiovascular:     Rate and Rhythm: Normal rate and regular rhythm.  Pulmonary:     Comments: Decreased breath sounds bilaterally.  Abdominal:     Palpations: Abdomen is soft.  Musculoskeletal:        General: Normal range of motion.     Cervical back: Normal range of motion.  Skin:    General: Skin is warm.     Findings: Bruising present.  Neurological:     General: No focal deficit present.     Mental Status: He is alert and oriented to person, place, and time.  Psychiatric:        Behavior: Behavior normal.        Judgment: Judgment normal.    LABORATORY DATA:  I have reviewed the data as listed Lab Results  Component Value Date   WBC 3.9 (L) 09/18/2021   HGB 14.0 09/18/2021   HCT 42.7 09/18/2021   MCV 101.4 (H) 09/18/2021   PLT 117 (L) 09/18/2021   Recent Labs    08/21/21 0811 09/04/21 0848 09/18/21 0924  NA 137 137 135  K 4.0 4.2 4.2  CL 107 106 104  CO2 $Re'24 24 23  'qnB$ GLUCOSE 156* 133* 99  BUN $Re'15 17 16  'hIf$ CREATININE 1.68* 1.71* 1.64*  CALCIUM 8.6* 8.9 8.4*  GFRNONAA 41* 40* 43*  PROT 6.1* 6.5 6.4*  ALBUMIN 3.3* 3.4* 3.2*  AST 34 32 35  ALT 28 28 33  ALKPHOS 227* 227* 229*  BILITOT 0.7 1.1 0.7  No results found for: "CEA"   RADIOGRAPHIC STUDIES: I have personally reviewed the radiological images as listed and agreed with the findings in the report.   ASSESSMENT & PLAN:   Rectal cancer ([place])  # STAGE IV-[N-RAS MUTATED] Synchronous primaries a] rectal cancer-10 cm-adenocarcinoma & b] transverse colon-- intramucosal adenoca]; abdominal lymphadenopathy; liver metastases; omental metastases. AUG 1st, 2023- CT CAP-continued increasing  multifocal bilateral pulmonary metastasis; no progression of disease anywhere else unchanged wall thickening involving the ascending colon/rectum.  Currently on FOLFIRI+ m-Vasi.    #Proceed with cycle #2  of FOLFIRI+M-Vasi. Labs today reviewed;   acceptable for treatment today.  ANC- 1.3- monitor for now.   # Anemia/iron deficiency hemoglobin ~13-  secondary to chronic GI bleeding/tumor-STABLE  # Thrombocytopenia/secondary chemotherapy/on aspirin-platelets- 80-90-monitor closely- STABLE  # HTN- 150s/94; at home BP reviewed- 130-150/ DBP80-90s.continue checking BP at home/reviewed the log from home.  Proceed with M-vasi today.STABLE  # Rash on Right Lower extremity- maculopaular rash- no pain/itch- recommend  Kenalog; sent.   # PN G-2-3 sec to Oxaliplatin [last 04/02/2021]. discontinued oxaliplatin for now. Poor tolerance to cymbalta 30 mg q day.STABLE  # Chronic kidney disease stage III-[GFR-40 ] -stable.  Continue keeping up with hydration.STABLE  * CEA- not marker;  # DISPOSITION:   # FOLFIRI +m-vasi today; pump off after 48 hours - no need to wait for UA  # Follow up in 2 weeks- MD: labs- cbc/cmp;UA  FOLFIRI + M-vasi-; Pump off 48 hours later- Dr.B   Rectal cancer ([facility])Todd Jelinski Ann Lions, MD 09/18/2021   CC: Dr. Rogue Bussing

## 2021-09-18 NOTE — Assessment & Plan Note (Addendum)
#  STAGE IV-[N-RAS MUTATED] Synchronous primaries a] rectal cancer-10 cm-adenocarcinoma & b] transverse colon-- intramucosal adenoca]; abdominal lymphadenopathy; liver metastases; omental metastases. AUG 1st, 2023- CT CAP-continued increasing multifocal bilateral pulmonary metastasis; no progression of disease anywhere else unchanged wall thickening involving the ascending colon/rectum.  Currently on FOLFIRI+ m-Vasi.    #Proceed with cycle #2  of FOLFIRI+M-Vasi. Labs today reviewed;  acceptable for treatment today.  ANC- 1.3- monitor for now.   # Anemia/iron deficiency hemoglobin ~13-  secondary to chronic GI bleeding/tumor-STABLE  # Thrombocytopenia/secondary chemotherapy/on aspirin-platelets- 80-90-monitor closely- STABLE  # HTN- 150s/94; at home BP reviewed- 130-150/ DBP80-90s.continue checking BP at home/reviewed the log from home.  Proceed with M-vasi today.STABLE  # Rash on Right Lower extremity- maculopaular rash- no pain/itch- recommend  Kenalog; sent.   # PN G-2-3 sec to Oxaliplatin [last 04/02/2021]. discontinued oxaliplatin for now. Poor tolerance to cymbalta 30 mg q day.STABLE  # Chronic kidney disease stage III-[GFR-40 ] -stable.  Continue keeping up with hydration.STABLE  * CEA- not marker;  # DISPOSITION:   # FOLFIRI +m-vasi today; pump off after 48 hours - no need to wait for UA  # Follow up in 2 weeks- MD: labs- cbc/cmp;UA  FOLFIRI + M-vasi-; Pump off 48 hours later- Dr.B

## 2021-09-20 ENCOUNTER — Inpatient Hospital Stay: Payer: Medicare HMO

## 2021-09-20 VITALS — BP 151/86 | HR 70 | Temp 97.0°F | Resp 18

## 2021-09-20 DIAGNOSIS — C184 Malignant neoplasm of transverse colon: Secondary | ICD-10-CM | POA: Diagnosis not present

## 2021-09-20 DIAGNOSIS — D509 Iron deficiency anemia, unspecified: Secondary | ICD-10-CM | POA: Diagnosis not present

## 2021-09-20 DIAGNOSIS — C7802 Secondary malignant neoplasm of left lung: Secondary | ICD-10-CM | POA: Diagnosis not present

## 2021-09-20 DIAGNOSIS — D701 Agranulocytosis secondary to cancer chemotherapy: Secondary | ICD-10-CM | POA: Diagnosis not present

## 2021-09-20 DIAGNOSIS — C786 Secondary malignant neoplasm of retroperitoneum and peritoneum: Secondary | ICD-10-CM | POA: Diagnosis not present

## 2021-09-20 DIAGNOSIS — Z79899 Other long term (current) drug therapy: Secondary | ICD-10-CM | POA: Diagnosis not present

## 2021-09-20 DIAGNOSIS — C20 Malignant neoplasm of rectum: Secondary | ICD-10-CM | POA: Diagnosis not present

## 2021-09-20 DIAGNOSIS — Z5111 Encounter for antineoplastic chemotherapy: Secondary | ICD-10-CM | POA: Diagnosis not present

## 2021-09-20 DIAGNOSIS — D696 Thrombocytopenia, unspecified: Secondary | ICD-10-CM | POA: Diagnosis not present

## 2021-09-20 DIAGNOSIS — N183 Chronic kidney disease, stage 3 unspecified: Secondary | ICD-10-CM | POA: Diagnosis not present

## 2021-09-20 DIAGNOSIS — C787 Secondary malignant neoplasm of liver and intrahepatic bile duct: Secondary | ICD-10-CM | POA: Diagnosis not present

## 2021-09-20 MED ORDER — SODIUM CHLORIDE 0.9% FLUSH
10.0000 mL | INTRAVENOUS | Status: DC | PRN
  Administered 2021-09-20: 10 mL
  Filled 2021-09-20: qty 10

## 2021-09-20 MED ORDER — HEPARIN SOD (PORK) LOCK FLUSH 100 UNIT/ML IV SOLN
500.0000 [IU] | Freq: Once | INTRAVENOUS | Status: AC | PRN
  Administered 2021-09-20: 500 [IU]
  Filled 2021-09-20: qty 5

## 2021-10-01 MED FILL — Dexamethasone Sodium Phosphate Inj 100 MG/10ML: INTRAMUSCULAR | Qty: 1 | Status: AC

## 2021-10-02 ENCOUNTER — Encounter: Payer: Self-pay | Admitting: Oncology

## 2021-10-02 ENCOUNTER — Inpatient Hospital Stay: Payer: Medicare HMO

## 2021-10-02 ENCOUNTER — Inpatient Hospital Stay (HOSPITAL_BASED_OUTPATIENT_CLINIC_OR_DEPARTMENT_OTHER): Payer: Medicare HMO | Admitting: Oncology

## 2021-10-02 ENCOUNTER — Ambulatory Visit: Payer: Medicare HMO | Admitting: Internal Medicine

## 2021-10-02 VITALS — BP 165/98 | HR 60 | Temp 96.6°F | Resp 18 | Wt 168.1 lb

## 2021-10-02 DIAGNOSIS — C20 Malignant neoplasm of rectum: Secondary | ICD-10-CM

## 2021-10-02 DIAGNOSIS — D696 Thrombocytopenia, unspecified: Secondary | ICD-10-CM | POA: Diagnosis not present

## 2021-10-02 DIAGNOSIS — C7802 Secondary malignant neoplasm of left lung: Secondary | ICD-10-CM | POA: Diagnosis not present

## 2021-10-02 DIAGNOSIS — Z79899 Other long term (current) drug therapy: Secondary | ICD-10-CM | POA: Diagnosis not present

## 2021-10-02 DIAGNOSIS — C786 Secondary malignant neoplasm of retroperitoneum and peritoneum: Secondary | ICD-10-CM | POA: Diagnosis not present

## 2021-10-02 DIAGNOSIS — Z5111 Encounter for antineoplastic chemotherapy: Secondary | ICD-10-CM

## 2021-10-02 DIAGNOSIS — C787 Secondary malignant neoplasm of liver and intrahepatic bile duct: Secondary | ICD-10-CM | POA: Diagnosis not present

## 2021-10-02 DIAGNOSIS — D509 Iron deficiency anemia, unspecified: Secondary | ICD-10-CM | POA: Diagnosis not present

## 2021-10-02 DIAGNOSIS — C184 Malignant neoplasm of transverse colon: Secondary | ICD-10-CM | POA: Diagnosis not present

## 2021-10-02 DIAGNOSIS — N183 Chronic kidney disease, stage 3 unspecified: Secondary | ICD-10-CM | POA: Diagnosis not present

## 2021-10-02 DIAGNOSIS — D701 Agranulocytosis secondary to cancer chemotherapy: Secondary | ICD-10-CM | POA: Diagnosis not present

## 2021-10-02 LAB — COMPREHENSIVE METABOLIC PANEL
ALT: 43 U/L (ref 0–44)
AST: 48 U/L — ABNORMAL HIGH (ref 15–41)
Albumin: 3.3 g/dL — ABNORMAL LOW (ref 3.5–5.0)
Alkaline Phosphatase: 263 U/L — ABNORMAL HIGH (ref 38–126)
Anion gap: 4 — ABNORMAL LOW (ref 5–15)
BUN: 13 mg/dL (ref 8–23)
CO2: 25 mmol/L (ref 22–32)
Calcium: 8.7 mg/dL — ABNORMAL LOW (ref 8.9–10.3)
Chloride: 104 mmol/L (ref 98–111)
Creatinine, Ser: 1.68 mg/dL — ABNORMAL HIGH (ref 0.61–1.24)
GFR, Estimated: 41 mL/min — ABNORMAL LOW (ref >60)
Glucose, Bld: 98 mg/dL (ref 70–99)
Potassium: 4.3 mmol/L (ref 3.5–5.1)
Sodium: 133 mmol/L — ABNORMAL LOW (ref 135–145)
Total Bilirubin: 0.8 mg/dL (ref 0.3–1.2)
Total Protein: 6.4 g/dL — ABNORMAL LOW (ref 6.5–8.1)

## 2021-10-02 LAB — CBC WITH DIFFERENTIAL/PLATELET
Abs Immature Granulocytes: 0 10*3/uL (ref 0.00–0.07)
Basophils Absolute: 0.1 10*3/uL (ref 0.0–0.1)
Basophils Relative: 2 %
Eosinophils Absolute: 0.6 10*3/uL — ABNORMAL HIGH (ref 0.0–0.5)
Eosinophils Relative: 18 %
HCT: 42.5 % (ref 39.0–52.0)
Hemoglobin: 14 g/dL (ref 13.0–17.0)
Immature Granulocytes: 0 %
Lymphocytes Relative: 50 %
Lymphs Abs: 1.6 10*3/uL (ref 0.7–4.0)
MCH: 33.1 pg (ref 26.0–34.0)
MCHC: 32.9 g/dL (ref 30.0–36.0)
MCV: 100.5 fL — ABNORMAL HIGH (ref 80.0–100.0)
Monocytes Absolute: 0.4 10*3/uL (ref 0.1–1.0)
Monocytes Relative: 11 %
Neutro Abs: 0.6 10*3/uL — ABNORMAL LOW (ref 1.7–7.7)
Neutrophils Relative %: 19 %
Platelets: 119 10*3/uL — ABNORMAL LOW (ref 150–400)
RBC: 4.23 MIL/uL (ref 4.22–5.81)
RDW: 15.7 % — ABNORMAL HIGH (ref 11.5–15.5)
WBC: 3.2 10*3/uL — ABNORMAL LOW (ref 4.0–10.5)
nRBC: 0 % (ref 0.0–0.2)

## 2021-10-02 LAB — URINALYSIS, DIPSTICK ONLY
Bilirubin Urine: NEGATIVE
Glucose, UA: NEGATIVE mg/dL
Hgb urine dipstick: NEGATIVE
Ketones, ur: NEGATIVE mg/dL
Leukocytes,Ua: NEGATIVE
Nitrite: NEGATIVE
Protein, ur: NEGATIVE mg/dL
Specific Gravity, Urine: 1.005 (ref 1.005–1.030)
pH: 7 (ref 5.0–8.0)

## 2021-10-02 MED ORDER — CHLORHEXIDINE GLUCONATE 0.12 % MT SOLN: 15.0000 mL | Freq: Two times a day (BID) | OROMUCOSAL | 0 refills | Status: DC

## 2021-10-02 NOTE — Progress Notes (Signed)
[place] NOTE  Patient Care Team: [name], MD as PCP - General (Family Medicine) Clent Jacks, RN as Oncology Nurse Navigator Cammie Sickle, MD as Consulting Physician (Oncology)  CHIEF COMPLAINTS/PURPOSE OF CONSULTATION: colon/rectal cancer #  Oncology History Overview Note  # Malignant partially obstructing tumor in the transverse colon/70 cm proximal to the anus- C. COLON MASS, 70 CM; COLD BIOPSY:  - INTRAMUCOSAL ADENOCARCINOMA AT LEAST;  # One 20 mm polyp in the transverse colon, removed with mucosal resection. Resected and retrieved. Clips were placed. Tattooed.  # tumor in the mid rectum and at 10 cm proximal to the anus. Biopsied.    SEE COMMENT.   Comment:  There is no definitive evidence of invasion in this sample.  The  findings may not accurately represent the entire underlying lesion;  clinical correlation is recommended.   D. COLON POLYP, TRANSVERSE; HOT SNARE:  - TUBULOVILLOUS ADENOMA.  - NEGATIVE FOR HIGH GRADE DYSPLASIA AND MALIGNANCY.   E. RECTUM MASS; COLD BIOPSY:  - INVASIVE ADENOCARCINOMA, MODERATELY TO POORLY DIFFERENTIATED. ----------------------------------   # PET scan: SEP 2022-proximal right colon [adjacent mesenteric lymph nodes] and rectal hypermetabolism.  Additional subcentimeter bilateral hypermetabolic lymphadenopathy noted in the retroperitoneal/left external iliac; inferior right lobe of the liver concerning for metastatic disease; right lower quadrant soft tissue mass concerning for peritoneal carcinomatosis; bilateral solid pulmonary nodules ~5 mm; MRI rectum- T stage: T4a; N stage:  N1.   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 No pathogenic variants identified on the [facility] Multi-Cancer+RNA Panel. The report date is 11/05/2020.  The Multi-Cancer Panel + RNA offered by [facility] includes sequencing and/or deletion duplication testing of the following 84 genes: AIP, ALK, APC, ATM, AXIN2,BAP1,  BARD1, BLM, BMPR1A, BRCA1, BRCA2, BRIP1, CASR, CDC73, CDH1, CDK4, CDKN1B, CDKN1C, CDKN2A (p14ARF), CDKN2A (p16INK4a), CEBPA, CHEK2, CTNNA1, DICER1, DIS3L2, EGFR (c.2369C>T, p.Thr790Met variant only), EPCAM (Deletion/duplication testing only), FH, FLCN, GATA2, GPC3, GREM1 (Promoter region deletion/duplication testing only), HOXB13 (c.251G>A, p.Gly84Glu), HRAS, KIT, MAX, MEN1, MET, MITF (c.952G>A, p.Glu318Lys variant only), MLH1, MSH2, MSH3, MSH6, MUTYH, NBN, NF1, NF2, NTHL1, PALB2, PDGFRA, PHOX2B, PMS2, POLD1, POLE, POT1, PRKAR1A, PTCH1, PTEN, RAD50, RAD51C, RAD51D, RB1, RECQL4, RET, RUNX1, SDHAF2, SDHA (sequence changes only), SDHB, SDHC, SDHD, SMAD4, SMARCA4, SMARCB1, SMARCE1, STK11, SUFU, TERC, TERT, TMEM127, TP53, TSC1, TSC2, VHL, WRN and WT1.   09/04/2021 -  Chemotherapy   Patient is on Treatment Plan : COLORECTAL FOLFIRI + Bevacizumab q14d       HISTORY OF PRESENTING ILLNESS: Ambulating independently.  Accompanied by daughter.  [name] 76 y.o.  male synchronous  colon  cancer/rectal cancer-stage IV-on FOLFIRI +Avastin  Patient was accompanied by daughter.  Reports feeling well.  No nausea vomiting diarrhea. Patient checks BP at home.  Systolic blood pressure mostly less than 140.  Today in clinic, BP 165/98.  Review of Systems  Constitutional:  Negative for chills, diaphoresis, fever and weight loss.  HENT:  Negative for nosebleeds and sore throat.   Eyes:  Negative for double vision.  Respiratory:  Negative for cough, hemoptysis, sputum production, shortness of breath and wheezing.   Cardiovascular:  Negative for chest pain, palpitations, orthopnea and leg swelling.  Gastrointestinal:  Negative for abdominal pain, blood in stool, constipation, diarrhea, heartburn, melena, nausea and vomiting.  Genitourinary:  Negative for dysuria, frequency and urgency.  Skin: Negative.  Negative for itching and rash.  Neurological:  Positive for tingling and sensory change. Negative for dizziness, focal weakness, weakness and headaches.  Endo/Heme/Allergies:  Does not bruise/bleed easily.  Psychiatric/Behavioral:  Negative for depression. The patient is not nervous/anxious and does not have insomnia.      MEDICAL HISTORY:  Past Medical History:  Diagnosis Date   CAD (coronary artery disease) 06/2014   UA with NSTEMI - cath with 99% prox L circ s/p stent, EF 40% ([place], [place] at [facility])   Emphysema    mild   Ex-smoker    Family history of breast cancer    Family history of colon cancer    Family history of pancreatic cancer    NSTEMI (non-ST elevated myocardial infarction) ([place]) 07/13/2014    SURGICAL HISTORY: Past Surgical History:  Procedure Laterality Date   CARDIAC CATHETERIZATION N/A 07/14/2014   Left Heart Cath and Coronary Angiography with stent placement;  Surgeon: Teodoro Spray, MD   CARDIAC CATHETERIZATION N/A 07/14/2014   Coronary Stent Intervention;  Surgeon: Yolonda Kida, MD   COLONOSCOPY WITH PROPOFOL N/A 09/06/2020   Procedure: COLONOSCOPY  WITH PROPOFOL;  Surgeon: Lin Landsman, MD;  Location: [facility] ENDOSCOPY;  Service: Gastroenterology;  Laterality: N/A;   CYSTECTOMY     on back   ESOPHAGOGASTRODUODENOSCOPY N/A 09/06/2020   Procedure: ESOPHAGOGASTRODUODENOSCOPY (EGD);  Surgeon: Lin Landsman, MD;  Location: [facility] ENDOSCOPY;  Service: Gastroenterology;  Laterality: N/A;   ESOPHAGOGASTRODUODENOSCOPY N/A 06/29/2021   Procedure: ESOPHAGOGASTRODUODENOSCOPY (EGD);  Surgeon: Lin Landsman, MD;  Location: [facility] ENDOSCOPY;  Service: Gastroenterology;  Laterality: N/A;   INGUINAL HERNIA REPAIR Right 08/17/04   IR IMAGING GUIDED PORT INSERTION  09/13/2020    SOCIAL HISTORY: Social History   Socioeconomic History   Marital status: Married    Spouse name: Not on file   Number of children: Not on file   Years of education: Not on file   Highest education level: Not on file  Occupational History   Not on file  Tobacco Use   Smoking status: Former    Packs/day: 1.00    Years: 35.00    Total pack years: 35.00    Types: Cigarettes    Quit date: 07/07/1998    Years since quitting: 23.2   Smokeless tobacco: Never  Vaping Use   Vaping Use: Never used  Substance and Sexual Activity   Alcohol use: No   Drug use: No   Sexual activity: Yes  Other Topics Concern   Not on file  Social History Narrative   Caffeine: 2 cups coffee, 2 cups tea/day   Lives with wife   Occupation: Higher education careers adviser, retired   Edu: HS   Activity: works in yard, walking   Diet: some  water, fruits/vegetables daily   -------------------------------------------------------------------       [employer] [place]; [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    Social Determinants of Health   Financial Resource Strain: Low Risk  (06/16/2020)   Overall Financial Resource Strain (CARDIA)    Difficulty of Paying Living Expenses: Not hard at all  Food Insecurity: No Food Insecurity (06/16/2020)    Hunger Vital Sign    Worried About Running Out of Food in the Last Year: Never true    Ran Out of Food in the Last Year: Never true  Transportation Needs: No Transportation Needs (06/16/2020)   PRAPARE - Hydrologist (Medical): No    Lack of Transportation (Non-Medical): No  Physical Activity: Inactive (06/16/2020)   Exercise Vital Sign    Days of Exercise per Week: 0 days    Minutes of Exercise per Session: 0 min  Stress: No Stress Concern Present (06/16/2020)   [place]    Feeling of Stress : Not at all  Social Connections: Not on file  Intimate Partner Violence: Not At Risk (06/16/2020)   Humiliation, Afraid, Rape, and Kick questionnaire    Fear of Current or Ex-Partner: No    Emotionally Abused: No    Physically Abused: No    Sexually Abused: No    FAMILY HISTORY: Family History  Problem Relation Age of Onset   Cancer Mother 30       colon   Cancer Sister        breast   Crohn's disease Sister    Cancer Daughter        anal   Crohn's disease Niece    CAD Neg Hx    Stroke Neg Hx    Diabetes Neg Hx    Prostate cancer Neg Hx    Kidney cancer Neg Hx    Bladder Cancer Neg Hx     ALLERGIES:  has No Known Allergies.  MEDICATIONS:  Current Outpatient Medications  Medication Sig Dispense Refill   aspirin EC 81 MG tablet Take 1 tablet (81 mg total) by mouth daily. 60 tablet 1   atorvastatin (LIPITOR) 40 MG tablet Take 1 tablet (40 mg total) by mouth daily at 6 PM. 60 tablet 1   chlorhexidine (PERIDEX) 0.12 % solution Use as directed 15 mLs in the mouth or throat 2 (two) times daily. 118 mL 0   Cholecalciferol (VITAMIN D3) 25 MCG (1000 UT) CAPS Take 1 capsule (1,000 Units total) by mouth daily. 30 capsule    Cyanocobalamin (B-12) 1000 MCG SUBL Place 1 tablet under the tongue daily.     DULoxetine (CYMBALTA) 30 MG capsule Take 1 capsule (30 mg total) by mouth daily. 30 capsule 3    ferrous sulfate 324 (65 Fe) MG TBEC Take 1 tablet (325 mg total) by mouth every other day.     lidocaine-prilocaine (EMLA) cream Apply 1 application topically as needed (to port a cath 1 hour prior to each chemotherapy). 30 g 3   metoprolol tartrate (LOPRESSOR) 25 MG tablet Take 1 tablet (25 mg total) by mouth 2 (two) times daily. 60 tablet 1   ondansetron (ZOFRAN) 8 MG tablet Take 1 tablet (8 mg total) by mouth every 8 (eight) hours as needed for nausea or vomiting. 20 tablet 3   pantoprazole (PROTONIX) 40 MG tablet TAKE 1 TABLET BY MOUTH TWICE DAILY BEFORE A MEAL 60  tablet 11   prochlorperazine (COMPAZINE) 10 MG tablet Take 1 tablet (10 mg total) by mouth every 6 (six) hours as needed for nausea or vomiting. 30 tablet 3   tadalafil (CIALIS) 20 MG tablet TAKE ONE TABLET BY MOUTH DAILY AS NEEDED FOR ERECTILE DYSFUNCTION 20 tablet 3   triamcinolone ointment (KENALOG) 0.5 % Apply 1 Application topically 2 (two) times daily. 30 g 0   diphenoxylate-atropine (LOMOTIL) 2.5-0.025 MG tablet Take 1 tablet by mouth 4 (four) times daily as needed for diarrhea or loose stools. Take it along with immodium (Patient not taking: Reported on 09/18/2021) 60 tablet 0   No current facility-administered medications for this visit.   Facility-Administered Medications Ordered in Other Visits  Medication Dose Route Frequency Provider Last Rate Last Admin   sodium chloride flush (NS) 0.9 % injection 10 mL  10 mL Intravenous PRN Cammie Sickle, MD   10 mL at 10/09/20 0855   .  PHYSICAL EXAMINATION: ECOG PERFORMANCE STATUS: 0 - Asymptomatic  Vitals:   10/02/21 1001  BP: (!) 165/98  Pulse: 60  Resp: 18  Temp: (!) 96.6 F (35.9 C)     Filed Weights   10/02/21 1001  Weight: 168 lb 1.6 oz (76.2 kg)    Physical Exam Vitals and nursing note reviewed.  HENT:     Head: Normocephalic and atraumatic.     Mouth/Throat:     Pharynx: Oropharynx is clear.  Eyes:     Extraocular Movements: Extraocular  movements intact.     Pupils: Pupils are equal, round, and reactive to light.  Cardiovascular:     Rate and Rhythm: Normal rate and regular rhythm.  Pulmonary:     Comments: Decreased breath sounds bilaterally.  Abdominal:     Palpations: Abdomen is soft.  Musculoskeletal:        General: Normal range of motion.     Cervical back: Normal range of motion.  Skin:    General: Skin is warm.     Findings: Bruising present.  Neurological:     General: No focal deficit present.     Mental Status: He is alert and oriented to person, place, and time.  Psychiatric:        Behavior: Behavior normal.        Judgment: Judgment normal.    LABORATORY DATA:  I have reviewed the data as listed Lab Results  Component Value Date   WBC 3.2 (L) 10/02/2021   HGB 14.0 10/02/2021   HCT 42.5 10/02/2021   MCV 100.5 (H) 10/02/2021   PLT 119 (L) 10/02/2021   Recent Labs    09/04/21 0848 09/18/21 0924 10/02/21 0946  NA 137 135 133*  K 4.2 4.2 4.3  CL 106 104 104  CO2 _0 GLUCOSE 133* 99 98  BUN _1 CREATININE 1.71* 1.64* 1.68*  CALCIUM 8.9 8.4* 8.7*  GFRNONAA 40* 43* 41*  PROT 6.5 6.4* 6.4*  ALBUMIN 3.4* 3.2* 3.3*  AST 32 35 48*  ALT 28 33 43  ALKPHOS 227* 229* 263*  BILITOT 1.1 0.7 0.8   No results found for: "CEA"   RADIOGRAPHIC STUDIES: I have personally reviewed the radiological images as listed and agreed with the findings in the report.   ASSESSMENT & PLAN:   Rectal cancer ([place])  # STAGE IV-[N-RAS MUTATED] Synchronous primaries a] rectal cancer-10 cm-adenocarcinoma & b] transverse colon-- intramucosal adenoca]; abdominal lymphadenopathy; liver metastases; omental metastases. AUG 1st, 2023- CT CAP-continued increasing multifocal bilateral pulmonary  metastasis; no progression of disease anywhere else unchanged wall thickening involving the ascending colon/rectum.  Currently on FOLFIRI+ m-Vasi.    #hold FOLFIRI+M-Vasi. Labs today reviewed # chemotherapy induced  neutropenia  ANC- 0.6, afebrile. Neutropenia precaution discussed with patient.   # Anemia/iron deficiency   secondary to chronic GI bleeding/tumor-STABLE  # Thrombocytopenia/secondary chemotherapy/on aspirin-platelets- 80-90-monitor closely- STABLE  # PN G-2-3 sec to Oxaliplatin [last 04/02/2021]. discontinued oxaliplatin for now. Poor tolerance to cymbalta 30 mg q day.STABLE  # Chronic kidney disease stage III-[GFR-40 ] -stable.  Continue keeping up with hydration.STABLE  * CEA- not marker;  # DISPOSITION:   # FOLFIRI +m-vasi today; pump off after 48 hours - no need to wait for UA  # Follow up in 2 weeks- MD: labs- cbc/cmp;UA  FOLFIRI + M-vasi-; Pump off 48 hours later- Dr.B   Encounter for antineoplastic chemotherapy Hold chemotherapy today   Earlie Server, MD 10/02/2021   CC: Dr. Rogue Bussing

## 2021-10-02 NOTE — Progress Notes (Signed)
Pt here for follow and tx. No new concerns voiced. Pt's BP is elevated, per pt when he checks at home bp is normal.

## 2021-10-02 NOTE — Assessment & Plan Note (Addendum)
#  STAGE IV-[N-RAS MUTATED] Synchronous primaries a] rectal cancer-10 cm-adenocarcinoma & b] transverse colon-- intramucosal adenoca]; abdominal lymphadenopathy; liver metastases; omental metastases. AUG 1st, 2023- CT CAP-continued increasing multifocal bilateral pulmonary metastasis; no progression of disease anywhere else unchanged wall thickening involving the ascending colon/rectum.  Currently on FOLFIRI+ m-Vasi.    #hold FOLFIRI+M-Vasi. Labs today reviewed # chemotherapy induced neutropenia  ANC- 0.6, afebrile. Neutropenia precaution discussed with patient.   # Anemia/iron deficiency   secondary to chronic GI bleeding/tumor-STABLE  # Thrombocytopenia/secondary chemotherapy/on aspirin-platelets- 80-90-monitor closely- STABLE  # HTN, home BP log reveals SBP mostly <140. Monitor.  # PN G-2-3 sec to Oxaliplatin [last 04/02/2021]. discontinued oxaliplatin for now. Poor tolerance to cymbalta 30 mg q day.STABLE  # Chronic kidney disease stage III-[GFR-40 ] -stable.  Continue keeping up with hydration.STABLE  * CEA- not marker;  # DISPOSITION:   # Follow up in1 week- MD: labs- cbc/cmp;UA  FOLFIRI + M-vasi-; Pump off 48 hours later- Dr.B

## 2021-10-02 NOTE — Assessment & Plan Note (Signed)
Hold chemotherapy today

## 2021-10-04 ENCOUNTER — Telehealth: Payer: Self-pay | Admitting: Nurse Practitioner

## 2021-10-04 ENCOUNTER — Inpatient Hospital Stay: Payer: Medicare HMO | Admitting: Nurse Practitioner

## 2021-10-04 ENCOUNTER — Inpatient Hospital Stay: Payer: Medicare HMO

## 2021-10-04 ENCOUNTER — Emergency Department: Payer: Medicare HMO

## 2021-10-04 ENCOUNTER — Telehealth: Payer: Self-pay | Admitting: *Deleted

## 2021-10-04 ENCOUNTER — Other Ambulatory Visit: Payer: Self-pay

## 2021-10-04 ENCOUNTER — Telehealth: Payer: Self-pay

## 2021-10-04 ENCOUNTER — Emergency Department
Admission: EM | Admit: 2021-10-04 | Discharge: 2021-10-04 | Disposition: A | Discharge status: Home / Self Care | Payer: Medicare HMO | Source: Home / Self Care | Attending: Emergency Medicine | Admitting: Emergency Medicine

## 2021-10-04 DIAGNOSIS — Z85038 Personal history of other malignant neoplasm of large intestine: Secondary | ICD-10-CM | POA: Insufficient documentation

## 2021-10-04 DIAGNOSIS — R5081 Fever presenting with conditions classified elsewhere: Secondary | ICD-10-CM

## 2021-10-04 DIAGNOSIS — A4189 Other specified sepsis: Secondary | ICD-10-CM | POA: Diagnosis not present

## 2021-10-04 DIAGNOSIS — J9811 Atelectasis: Secondary | ICD-10-CM | POA: Diagnosis not present

## 2021-10-04 DIAGNOSIS — U071 COVID-19: Secondary | ICD-10-CM | POA: Insufficient documentation

## 2021-10-04 DIAGNOSIS — R531 Weakness: Secondary | ICD-10-CM | POA: Insufficient documentation

## 2021-10-04 DIAGNOSIS — D72819 Decreased white blood cell count, unspecified: Secondary | ICD-10-CM | POA: Insufficient documentation

## 2021-10-04 DIAGNOSIS — T451X5A Adverse effect of antineoplastic and immunosuppressive drugs, initial encounter: Secondary | ICD-10-CM

## 2021-10-04 DIAGNOSIS — I2510 Atherosclerotic heart disease of native coronary artery without angina pectoris: Secondary | ICD-10-CM | POA: Insufficient documentation

## 2021-10-04 DIAGNOSIS — C20 Malignant neoplasm of rectum: Secondary | ICD-10-CM

## 2021-10-04 LAB — BASIC METABOLIC PANEL
Anion gap: 8 (ref 5–15)
BUN: 19 mg/dL (ref 8–23)
CO2: 25 mmol/L (ref 22–32)
Calcium: 8.9 mg/dL (ref 8.9–10.3)
Chloride: 97 mmol/L — ABNORMAL LOW (ref 98–111)
Creatinine, Ser: 2.13 mg/dL — ABNORMAL HIGH (ref 0.61–1.24)
GFR, Estimated: 31 mL/min — ABNORMAL LOW (ref >60)
Glucose, Bld: 113 mg/dL — ABNORMAL HIGH (ref 70–99)
Potassium: 4.3 mmol/L (ref 3.5–5.1)
Sodium: 130 mmol/L — ABNORMAL LOW (ref 135–145)

## 2021-10-04 LAB — CBC
HCT: 43.8 % (ref 39.0–52.0)
Hemoglobin: 14.1 g/dL (ref 13.0–17.0)
MCH: 32.3 pg (ref 26.0–34.0)
MCHC: 32.2 g/dL (ref 30.0–36.0)
MCV: 100.2 fL — ABNORMAL HIGH (ref 80.0–100.0)
Platelets: 113 10*3/uL — ABNORMAL LOW (ref 150–400)
RBC: 4.37 MIL/uL (ref 4.22–5.81)
RDW: 15.9 % — ABNORMAL HIGH (ref 11.5–15.5)
WBC: 2 10*3/uL — ABNORMAL LOW (ref 4.0–10.5)
nRBC: 0 % (ref 0.0–0.2)

## 2021-10-04 LAB — URINALYSIS, ROUTINE W REFLEX MICROSCOPIC
Bacteria, UA: NONE SEEN
Glucose, UA: NEGATIVE mg/dL
Hgb urine dipstick: NEGATIVE
Leukocytes,Ua: NEGATIVE
Nitrite: NEGATIVE
Protein, ur: 30 mg/dL — AB
Specific Gravity, Urine: 1.02 (ref 1.005–1.030)
pH: 5 (ref 5.0–8.0)

## 2021-10-04 LAB — RESP PANEL BY RT-PCR (FLU A&B, COVID) ARPGX2
Influenza A by PCR: NEGATIVE
Influenza B by PCR: NEGATIVE
SARS Coronavirus 2 by RT PCR: POSITIVE — AB

## 2021-10-04 LAB — LACTIC ACID, PLASMA: Lactic Acid, Venous: 1.8 mmol/L (ref 0.5–1.9)

## 2021-10-04 LAB — CBG MONITORING, ED: Glucose-Capillary: 115 mg/dL — ABNORMAL HIGH (ref 70–99)

## 2021-10-04 MED ORDER — SODIUM CHLORIDE 0.9 % IV SOLN: 2.0000 g | Freq: Once | INTRAVENOUS | Status: DC

## 2021-10-04 MED ORDER — METRONIDAZOLE 500 MG/100ML IV SOLN: 500.0000 mg | Freq: Once | INTRAVENOUS | Status: DC

## 2021-10-04 MED ORDER — VANCOMYCIN HCL IN DEXTROSE 1-5 GM/200ML-% IV SOLN: 1000.0000 mg | Freq: Once | INTRAVENOUS | Status: DC

## 2021-10-04 MED ORDER — LACTATED RINGERS IV BOLUS (SEPSIS)
1000.0000 mL | Freq: Once | INTRAVENOUS | Status: AC
  Administered 2021-10-04: 1000 mL via INTRAVENOUS

## 2021-10-04 MED ORDER — KETOROLAC TROMETHAMINE 30 MG/ML IJ SOLN
15.0000 mg | Freq: Once | INTRAMUSCULAR | Status: AC
  Administered 2021-10-04: 15 mg via INTRAVENOUS
  Filled 2021-10-04: qty 1

## 2021-10-04 MED ORDER — ONDANSETRON HCL 4 MG/2ML IJ SOLN
4.0000 mg | Freq: Once | INTRAMUSCULAR | Status: AC
  Administered 2021-10-04: 4 mg via INTRAVENOUS
  Filled 2021-10-04: qty 2

## 2021-10-04 MED ORDER — ACETAMINOPHEN 325 MG PO TABS
650.0000 mg | ORAL_TABLET | Freq: Once | ORAL | Status: AC | PRN
  Administered 2021-10-04: 650 mg via ORAL
  Filled 2021-10-04: qty 2

## 2021-10-04 NOTE — Telephone Encounter (Signed)
Spoke with daughter, Lenna Sciara. Per Dr. Tasia Catchings, instructed pt's daughter patient needs to obtain covid testing due to recent neutropenia and if negative be evaluated in smc.  Apt given for smc tom at 9 am (pending covid negative test results) daughter is on her way to patient's residence now to see patient. She will try and get a covid home kit or obtain pcr testing for patient. She will call our office with test results.

## 2021-10-04 NOTE — Telephone Encounter (Deleted)
error 

## 2021-10-04 NOTE — Telephone Encounter (Signed)
Pt's daughter, Lenna Sciara, called clinic and reported that pt had a positive COVID test. She also stated that pt has a fever of 101.1. Informed pt's daughter that Beckey Rutter, NP, would like to do a virtual visit this afternoon. She is agreeable to this. Instructed on how to start visit and appointment scheduled.

## 2021-10-04 NOTE — ED Triage Notes (Signed)
Pt via POV c/o weakness, fever 101.1 temp at home. Pt is in treatment for colorectal cancer and was supposed to receive a chemo treatment on Tuesday but they did not perform the treatment due to pt symptoms. He has since tested positive for COVID and has slept all day with poor PO intake. Daughter is assisting with pt history.

## 2021-10-04 NOTE — Telephone Encounter (Signed)
Daughter Albert Giles called reporting that patient patient was fine yesterday and was eating and such, but this morning, he did not get awake until mid morning and then stayed in bed until noon which is totally out of character for him, he always gets up at 6 AM and goes outside. He has not eaten or taken his medications yet. WHen he got up, he went a sat in his recliner and fell back to sleep. His only complaint is that he has a headache. She is asking if she needs to be concerned. His treatment was held Tuesday due to low WBC. She states that he does not have a headache n/v or sore throat. Please advise  Albert Giles said she can bring him in today if needed. Please return call to her.

## 2021-10-04 NOTE — ED Notes (Signed)
First Nurse Note: Patient sent to ED by the cancer center. Patient sent for neutropenia and fever. Patient is COVID positive.

## 2021-10-04 NOTE — Telephone Encounter (Signed)
Patient on chemotherapy. Was neutropenic earlier this week. Has now tested positive for covid and is febrile at home, 101. Per wife, patient is altered, lethargic. Recommended ER via EMS. Dr Tasia Catchings updated.

## 2021-10-04 NOTE — Sepsis Progress Note (Addendum)
Monitoring for the code sepsis protocol.  Order canceled shortly after being ordered.

## 2021-10-04 NOTE — Discharge Instructions (Addendum)
As we discussed please follow-up tomorrow with your primary care doctor to decide if they would like you on antiviral medications.  Please drink plenty of fluids, use Tylenol or ibuprofen as written on the box as needed for fever or discomfort.  Return to the emergency department for any trouble breathing, any chest pain, or any other symptom personally concerning to yourself.

## 2021-10-04 NOTE — ED Provider Notes (Signed)
[facility] Provider Note    Event Date/Time   First MD Initiated Contact with Patient 10/04/21 1830     (approximate)  History   Chief Complaint: Weakness  HPI  Albert Giles is a 76 y.o. male with a past medical history of CAD, colon cancer on chemotherapy, presents to the emergency department for fever weakness and headache.  According to the patient for the past few days the patient has been experiencing fever at home, today was feeling very weak and had a headache.  Patient has a history of colon cancer on chemotherapy, last chemotherapy was approximately 2 weeks ago per family.  Patient's physician asked him to come to the emergency department for evaluation.  Here the patient appears well, currently satting 95% on my evaluation, he is febrile to 102.8 does complain of generalized body aches but states he is no longer feeling weak and feels much better.  Patient states he took a COVID test at home that was positive.  Family member also tested positive.  Physical Exam   Triage Vital Signs: ED Triage Vitals  Enc Vitals Group     BP 10/04/21 1736 119/72     Pulse Rate 10/04/21 1736 81     Resp 10/04/21 1736 20     Temp 10/04/21 1736 (!) 102.8 F (39.3 C)     Temp Source 10/04/21 1736 Oral     SpO2 10/04/21 1736 92 %     Weight 10/04/21 1733 168 lb (76.2 kg)     Height 10/04/21 1733 '5\' 8"'$  (1.727 m)     Head Circumference --      Peak Flow --      Pain Score 10/04/21 1733 0     Pain Loc --      Pain Edu? --      Excl. in [place]? --     Most recent vital signs: Vitals:   10/04/21 1736  BP: 119/72  Pulse: 81  Resp: 20  Temp: (!) 102.8 F (39.3 C)  SpO2: 92%    General: Awake, no distress.  CV:  Good peripheral perfusion.  Regular rate and rhythm around 80 bpm. Resp:  Normal effort.  Equal breath sounds bilaterally.  No obvious wheeze rales or rhonchi. Abd:  No distention.  Soft, nontender.  No rebound or guarding. Other:  No significant lower  extremity edema.   ED Results / Procedures / Treatments   RADIOLOGY  I personally reviewed and interpreted the chest x-ray images.  There might be some slight small consolidations in the right lung but no large consolidation. Radiology is read the x-ray as mild cardiomegaly with mild vascular congestion.   MEDICATIONS ORDERED IN ED: Medications  lactated ringers bolus 1,000 mL (1,000 mLs Intravenous New Bag/Given 10/04/21 1900)  acetaminophen (TYLENOL) tablet 650 mg (650 mg Oral Given 10/04/21 1740)  ketorolac (TORADOL) 30 MG/ML injection 15 mg (15 mg Intravenous Given 10/04/21 1854)  ondansetron (ZOFRAN) injection 4 mg (4 mg Intravenous Given 10/04/21 1854)     IMPRESSION / MDM / ASSESSMENT AND PLAN / ED COURSE  I reviewed the triage vital signs and the nursing notes.  Patient's presentation is most consistent with acute presentation with potential threat to life or bodily function.  Patient presents to the emergency department for generalized weakness headache found to be febrile to 102.8 in the emergency department.  Patient did have initial low saturation 92% on room air, 95 during my evaluation on room air.  Patient states  he took a COVID test at home that was positive.  Highly suspect this is due to COVID and.  The remainder the patient's work-up shows no insufficiency slightly worse than chronic.  Patient is leukopenic which could be due to chemotherapy or COVID.  Patient did not get chemotherapy this week so he has not had chemotherapy in a little over 2 weeks per family.  We will obtain a PCR COVID test.  We will IV hydrate we will treat with Toradol, Zofran we will continue to closely monitor in the emergency department.  Patient is already made it clear during my evaluation that he does not wish to be admitted to the hospital and ultimately wishes to be discharged home as he is feeling better.  Patient did get a '15mg'$  dose of Toradol, prior to chemistry result.  We will IV hydrate.  As  the patient is on chemotherapy we will also send blood cultures as a precaution.  Chest x-ray does not appear to show any consolidation.  COVID test has resulted positive.  Lactic acid is around 1.8.  Patient has now received a liter of fluid.  CBC does show leukopenia 2.0 but otherwise reassuring.  Patient states he feels well and he wants to go home.  Has not yet provided a urine sample, but denies any urinary symptoms.  We will discharge the patient home per his wishes patient will follow-up with his doctor I discussed my typical COVID return precautions.  Patient agreeable to plan of care.  FINAL CLINICAL IMPRESSION(S) / ED DIAGNOSES   Weakness COVID-19    Note:  This document was prepared using Dragon voice recognition software and may include unintentional dictation errors.   Harvest Dark, MD 10/04/21 2114

## 2021-10-05 ENCOUNTER — Emergency Department: Payer: Medicare HMO

## 2021-10-05 ENCOUNTER — Inpatient Hospital Stay
Admission: EM | Admit: 2021-10-05 | Discharge: 2021-10-08 | DRG: 871 | Disposition: A | Discharge status: Home Health | Payer: Medicare HMO | Attending: Internal Medicine | Admitting: Internal Medicine

## 2021-10-05 ENCOUNTER — Other Ambulatory Visit: Payer: Self-pay

## 2021-10-05 ENCOUNTER — Ambulatory Visit: Payer: Medicare HMO

## 2021-10-05 ENCOUNTER — Other Ambulatory Visit: Payer: Self-pay | Admitting: Oncology

## 2021-10-05 ENCOUNTER — Other Ambulatory Visit: Payer: Medicare HMO

## 2021-10-05 ENCOUNTER — Encounter: Payer: Medicare HMO | Admitting: Medical Oncology

## 2021-10-05 ENCOUNTER — Telehealth: Payer: Self-pay | Admitting: Family Medicine

## 2021-10-05 ENCOUNTER — Inpatient Hospital Stay: Payer: Medicare HMO | Admitting: Nurse Practitioner

## 2021-10-05 DIAGNOSIS — K219 Gastro-esophageal reflux disease without esophagitis: Secondary | ICD-10-CM | POA: Diagnosis present

## 2021-10-05 DIAGNOSIS — Z87891 Personal history of nicotine dependence: Secondary | ICD-10-CM

## 2021-10-05 DIAGNOSIS — R5081 Fever presenting with conditions classified elsewhere: Secondary | ICD-10-CM | POA: Diagnosis present

## 2021-10-05 DIAGNOSIS — N138 Other obstructive and reflux uropathy: Secondary | ICD-10-CM | POA: Diagnosis not present

## 2021-10-05 DIAGNOSIS — N179 Acute kidney failure, unspecified: Secondary | ICD-10-CM

## 2021-10-05 DIAGNOSIS — Z9221 Personal history of antineoplastic chemotherapy: Secondary | ICD-10-CM

## 2021-10-05 DIAGNOSIS — U071 COVID-19: Secondary | ICD-10-CM | POA: Diagnosis not present

## 2021-10-05 DIAGNOSIS — E871 Hypo-osmolality and hyponatremia: Secondary | ICD-10-CM

## 2021-10-05 DIAGNOSIS — D709 Neutropenia, unspecified: Secondary | ICD-10-CM

## 2021-10-05 DIAGNOSIS — D509 Iron deficiency anemia, unspecified: Secondary | ICD-10-CM | POA: Diagnosis present

## 2021-10-05 DIAGNOSIS — N1832 Chronic kidney disease, stage 3b: Secondary | ICD-10-CM | POA: Diagnosis present

## 2021-10-05 DIAGNOSIS — I2510 Atherosclerotic heart disease of native coronary artery without angina pectoris: Secondary | ICD-10-CM | POA: Diagnosis not present

## 2021-10-05 DIAGNOSIS — I10 Essential (primary) hypertension: Secondary | ICD-10-CM | POA: Diagnosis not present

## 2021-10-05 DIAGNOSIS — E538 Deficiency of other specified B group vitamins: Secondary | ICD-10-CM | POA: Diagnosis not present

## 2021-10-05 DIAGNOSIS — J439 Emphysema, unspecified: Secondary | ICD-10-CM | POA: Diagnosis present

## 2021-10-05 DIAGNOSIS — D500 Iron deficiency anemia secondary to blood loss (chronic): Secondary | ICD-10-CM

## 2021-10-05 DIAGNOSIS — E785 Hyperlipidemia, unspecified: Secondary | ICD-10-CM | POA: Diagnosis not present

## 2021-10-05 DIAGNOSIS — I252 Old myocardial infarction: Secondary | ICD-10-CM

## 2021-10-05 DIAGNOSIS — Z79899 Other long term (current) drug therapy: Secondary | ICD-10-CM

## 2021-10-05 DIAGNOSIS — D61818 Other pancytopenia: Secondary | ICD-10-CM | POA: Diagnosis not present

## 2021-10-05 DIAGNOSIS — Z9861 Coronary angioplasty status: Secondary | ICD-10-CM

## 2021-10-05 DIAGNOSIS — Z803 Family history of malignant neoplasm of breast: Secondary | ICD-10-CM

## 2021-10-05 DIAGNOSIS — A419 Sepsis, unspecified organism: Secondary | ICD-10-CM

## 2021-10-05 DIAGNOSIS — N401 Enlarged prostate with lower urinary tract symptoms: Secondary | ICD-10-CM | POA: Diagnosis not present

## 2021-10-05 DIAGNOSIS — A4189 Other specified sepsis: Principal | ICD-10-CM | POA: Diagnosis present

## 2021-10-05 DIAGNOSIS — R531 Weakness: Secondary | ICD-10-CM | POA: Diagnosis not present

## 2021-10-05 DIAGNOSIS — I129 Hypertensive chronic kidney disease with stage 1 through stage 4 chronic kidney disease, or unspecified chronic kidney disease: Secondary | ICD-10-CM | POA: Diagnosis present

## 2021-10-05 DIAGNOSIS — E872 Acidosis, unspecified: Secondary | ICD-10-CM | POA: Diagnosis not present

## 2021-10-05 DIAGNOSIS — Z800 Family history of malignant neoplasm of digestive organs: Secondary | ICD-10-CM

## 2021-10-05 DIAGNOSIS — C20 Malignant neoplasm of rectum: Secondary | ICD-10-CM | POA: Diagnosis present

## 2021-10-05 DIAGNOSIS — N170 Acute kidney failure with tubular necrosis: Secondary | ICD-10-CM | POA: Diagnosis not present

## 2021-10-05 DIAGNOSIS — Z7982 Long term (current) use of aspirin: Secondary | ICD-10-CM

## 2021-10-05 LAB — BLOOD CULTURE ID PANEL (REFLEXED) - BCID2

## 2021-10-05 LAB — COMPREHENSIVE METABOLIC PANEL
ALT: 35 U/L (ref 0–44)
AST: 47 U/L — ABNORMAL HIGH (ref 15–41)
Albumin: 3 g/dL — ABNORMAL LOW (ref 3.5–5.0)
Alkaline Phosphatase: 213 U/L — ABNORMAL HIGH (ref 38–126)
Anion gap: 8 (ref 5–15)
BUN: 24 mg/dL — ABNORMAL HIGH (ref 8–23)
CO2: 22 mmol/L (ref 22–32)
Calcium: 8.4 mg/dL — ABNORMAL LOW (ref 8.9–10.3)
Chloride: 100 mmol/L (ref 98–111)
Creatinine, Ser: 2.17 mg/dL — ABNORMAL HIGH (ref 0.61–1.24)
GFR, Estimated: 30 mL/min — ABNORMAL LOW (ref >60)
Glucose, Bld: 116 mg/dL — ABNORMAL HIGH (ref 70–99)
Potassium: 4.3 mmol/L (ref 3.5–5.1)
Sodium: 130 mmol/L — ABNORMAL LOW (ref 135–145)
Total Bilirubin: 1.1 mg/dL (ref 0.3–1.2)
Total Protein: 5.8 g/dL — ABNORMAL LOW (ref 6.5–8.1)

## 2021-10-05 LAB — CBC
HCT: 39.7 % (ref 39.0–52.0)
Hemoglobin: 12.9 g/dL — ABNORMAL LOW (ref 13.0–17.0)
MCH: 33 pg (ref 26.0–34.0)
MCHC: 32.5 g/dL (ref 30.0–36.0)
MCV: 101.5 fL — ABNORMAL HIGH (ref 80.0–100.0)
Platelets: 66 10*3/uL — ABNORMAL LOW (ref 150–400)
RBC: 3.91 MIL/uL — ABNORMAL LOW (ref 4.22–5.81)
RDW: 16 % — ABNORMAL HIGH (ref 11.5–15.5)
WBC: 1.5 10*3/uL — ABNORMAL LOW (ref 4.0–10.5)
nRBC: 0 % (ref 0.0–0.2)

## 2021-10-05 LAB — TROPONIN I (HIGH SENSITIVITY)
Troponin I (High Sensitivity): 26 ng/L — ABNORMAL HIGH (ref <18)
Troponin I (High Sensitivity): 20 ng/L — ABNORMAL HIGH (ref <18)

## 2021-10-05 LAB — LACTIC ACID, PLASMA
Lactic Acid, Venous: 3.1 mmol/L (ref 0.5–1.9)
Lactic Acid, Venous: 1.8 mmol/L (ref 0.5–1.9)

## 2021-10-05 LAB — PROCALCITONIN: Procalcitonin: 4.05 ng/mL

## 2021-10-05 MED ORDER — METRONIDAZOLE 500 MG/100ML IV SOLN
500.0000 mg | Freq: Two times a day (BID) | INTRAVENOUS | Status: DC
  Administered 2021-10-05 – 2021-10-06 (×2): 500 mg via INTRAVENOUS
  Filled 2021-10-05 (×2): qty 100

## 2021-10-05 MED ORDER — SODIUM CHLORIDE 0.9 % IV SOLN
2.0000 g | Freq: Once | INTRAVENOUS | Status: AC
  Administered 2021-10-05: 2 g via INTRAVENOUS
  Filled 2021-10-05: qty 12.5

## 2021-10-05 MED ORDER — ONDANSETRON HCL 4 MG/2ML IJ SOLN: 4.0000 mg | Freq: Four times a day (QID) | INTRAMUSCULAR | Status: DC | PRN

## 2021-10-05 MED ORDER — NIRMATRELVIR/RITONAVIR (PAXLOVID) TABLET (RENAL DOSING): 2.0000 | ORAL_TABLET | Freq: Two times a day (BID) | ORAL | 0 refills | Status: DC

## 2021-10-05 MED ORDER — SENNOSIDES-DOCUSATE SODIUM 8.6-50 MG PO TABS: 1.0000 | ORAL_TABLET | Freq: Every evening | ORAL | Status: DC | PRN

## 2021-10-05 MED ORDER — VITAMIN B-12 1000 MCG PO TABS
1000.0000 ug | ORAL_TABLET | Freq: Every day | ORAL | Status: DC
  Administered 2021-10-06 – 2021-10-08 (×3): 1000 ug via ORAL
  Filled 2021-10-05: qty 10
  Filled 2021-10-05 (×2): qty 1
  Filled 2021-10-05: qty 10
  Filled 2021-10-05: qty 1

## 2021-10-05 MED ORDER — ACETAMINOPHEN 650 MG RE SUPP: 650.0000 mg | Freq: Four times a day (QID) | RECTAL | Status: DC | PRN

## 2021-10-05 MED ORDER — PIPERACILLIN-TAZOBACTAM 3.375 G IVPB 30 MIN: 3.3750 g | Freq: Once | INTRAVENOUS | Status: DC

## 2021-10-05 MED ORDER — SODIUM CHLORIDE 0.9 % IV BOLUS (SEPSIS)
1000.0000 mL | Freq: Once | INTRAVENOUS | Status: AC
  Administered 2021-10-05: 1000 mL via INTRAVENOUS

## 2021-10-05 MED ORDER — ONDANSETRON HCL 4 MG PO TABS: 4.0000 mg | ORAL_TABLET | Freq: Four times a day (QID) | ORAL | Status: DC | PRN

## 2021-10-05 MED ORDER — METOPROLOL TARTRATE 25 MG PO TABS
25.0000 mg | ORAL_TABLET | Freq: Two times a day (BID) | ORAL | Status: DC
  Administered 2021-10-06 – 2021-10-08 (×5): 25 mg via ORAL
  Filled 2021-10-05 (×5): qty 1

## 2021-10-05 MED ORDER — ACETAMINOPHEN 325 MG PO TABS
650.0000 mg | ORAL_TABLET | Freq: Four times a day (QID) | ORAL | Status: DC | PRN
  Administered 2021-10-05: 650 mg via ORAL
  Filled 2021-10-05: qty 2

## 2021-10-05 MED ORDER — SODIUM CHLORIDE 0.9 % IV SOLN
2.0000 g | INTRAVENOUS | Status: DC
  Filled 2021-10-05: qty 12.5

## 2021-10-05 MED ORDER — PANTOPRAZOLE SODIUM 40 MG PO TBEC
40.0000 mg | DELAYED_RELEASE_TABLET | Freq: Two times a day (BID) | ORAL | Status: DC
  Administered 2021-10-06 – 2021-10-08 (×5): 40 mg via ORAL
  Filled 2021-10-05 (×5): qty 1

## 2021-10-05 MED ORDER — VANCOMYCIN HCL 1750 MG/350ML IV SOLN
1750.0000 mg | Freq: Once | INTRAVENOUS | Status: AC
  Administered 2021-10-05: 1750 mg via INTRAVENOUS
  Filled 2021-10-05: qty 350

## 2021-10-05 MED ORDER — VANCOMYCIN VARIABLE DOSE PER UNSTABLE RENAL FUNCTION (PHARMACIST DOSING): Status: DC

## 2021-10-05 MED ORDER — ALBUTEROL SULFATE (2.5 MG/3ML) 0.083% IN NEBU: 2.5000 mg | INHALATION_SOLUTION | RESPIRATORY_TRACT | Status: DC | PRN

## 2021-10-05 NOTE — Assessment & Plan Note (Signed)
-   Blood cultures x2 are in process, elevated procalcitonin - Lactic acid is downtrending - Continue broad-spectrum antibiotic with cefepime, vancomycin, metronidazole - Neutropenic precautions - Admit to inpatient telemetry

## 2021-10-05 NOTE — Progress Notes (Signed)
Appt cancelled. Patient in ER.

## 2021-10-05 NOTE — Consult Note (Signed)
Pharmacy Antibiotic Note  Albert Giles is a 76 y.o. male with PMH of HTN, CAD, CKD and colon cancer admitted on 10/05/2021 with  generalized weakness .  Pharmacy has been consulted for Vancomycin and Cefepime dosing.  Difficult to determine if patient is has AKI vs CKD. Current Scr: 2.17, baseline looks to be in the 1.6s, therefore will defer AUC dosing currently until renal function returns to baseline.  Plan: - Vancomycin '1750mg'$  x 1 ordered - Will order Vancomycin Random level to be drawn @ 2000 on 10/06/21(~24 hours after loading dose given) - Continue to dose via random levels until renal function improves     Temp (24hrs), Avg:100.7 F (38.2 C), Min:99.7 F (37.6 C), Max:102.8 F (39.3 C)  Recent Labs  Lab 10/02/21 0946 10/04/21 1741 10/05/21 1147  WBC 3.2* 2.0* 1.5*  CREATININE 1.68* 2.13* 2.17*  LATICACIDVEN  --  1.8  --     Estimated Creatinine Clearance: 27.1 mL/min (A) (by C-G formula based on SCr of 2.17 mg/dL (H)).    No Known Allergies  Antimicrobials this admission: Vancomycin 9/22 >>  Cefepime 9/22 >>  Flagyl 9/22 >>  Dose adjustments this admission: N/A  Microbiology results: 9/22 BCx: collected 9/22 MRSA PCR: pending  Thank you for allowing pharmacy to be a part of this patient's care.  [name] [name] 10/05/2021 4:10 PM

## 2021-10-05 NOTE — ED Notes (Signed)
Pt ambulating independently, appears somewhat unsteady on feet. Oxygen saturations 94-97% during ambulation.

## 2021-10-05 NOTE — ED Provider Notes (Signed)
[facility] Provider Note    Event Date/Time   First MD Initiated Contact with Patient 10/05/21 1048     (approximate)   History   Chief Complaint Weakness   HPI  ANNETTE LIOTTA is a 76 y.o. male with past medical history of hypertension, CAD, CKD, and colon cancer who presents to the ED complaining of generalized weakness.  Patient was initially evaluated in the ED yesterday after developing fever and generalized weakness.  He was diagnosed with COVID-19 at that time, work-up otherwise reassuring and he was discharged home with plan for outpatient follow-up.  Since then, patient has continued to feel weak and had 2 separate falls.  He states that he lost his balance and fell to the ground while attempting to stand up from the couch, believes that he hit his head on the floor but denies losing consciousness.  He is not anticoagulated outside of a daily baby aspirin.  He has felt feverish at home but denies any cough, difficulty breathing, nausea, vomiting, or diarrhea.     Physical Exam   Triage Vital Signs: ED Triage Vitals [10/05/21 1044]  Enc Vitals Group     BP 97/65     Pulse Rate 82     Resp 16     Temp 99.7 F (37.6 C)     Temp Source Oral     SpO2 94 %     Weight      Height      Head Circumference      Peak Flow      Pain Score      Pain Loc      Pain Edu?      Excl. in [place]?     Most recent vital signs: Vitals:   10/05/21 1455 10/05/21 1545  BP:  134/81  Pulse:  83  Resp:  (!) 22  Temp: 100.3 F (37.9 C)   SpO2:  97%    Constitutional: Alert and oriented. Eyes: Conjunctivae are normal. Head: Atraumatic. Nose: No congestion/rhinnorhea. Mouth/Throat: Mucous membranes are moist.  Neck: No midline cervical spine tenderness to palpation. Cardiovascular: Normal rate, regular rhythm. Grossly normal heart sounds.  2+ radial pulses bilaterally. Respiratory: Normal respiratory effort.  No retractions. Lungs CTAB. Gastrointestinal:  Soft and nontender. No distention. Musculoskeletal: No lower extremity tenderness nor edema.  Neurologic:  Normal speech and language. No gross focal neurologic deficits are appreciated.    ED Results / Procedures / Treatments   Labs (all labs ordered are listed, but only abnormal results are displayed) Labs Reviewed  CBC - Abnormal; Notable for the following components:      Result Value   WBC 1.5 (*)    RBC 3.91 (*)    Hemoglobin 12.9 (*)    MCV 101.5 (*)    RDW 16.0 (*)    Platelets 66 (*)    All other components within normal limits  COMPREHENSIVE METABOLIC PANEL - Abnormal; Notable for the following components:   Sodium 130 (*)    Glucose, Bld 116 (*)    BUN 24 (*)    Creatinine, Ser 2.17 (*)    Calcium 8.4 (*)    Total Protein 5.8 (*)    Albumin 3.0 (*)    AST 47 (*)    Alkaline Phosphatase 213 (*)    GFR, Estimated 30 (*)    All other components within normal limits  TROPONIN I (HIGH SENSITIVITY) - Abnormal; Notable for the following components:   Troponin  I (High Sensitivity) 26 (*)    All other components within normal limits  TROPONIN I (HIGH SENSITIVITY) - Abnormal; Notable for the following components:   Troponin I (High Sensitivity) 20 (*)    All other components within normal limits  CULTURE, BLOOD (ROUTINE X 2)  CULTURE, BLOOD (ROUTINE X 2)  LACTIC ACID, PLASMA  LACTIC ACID, PLASMA  URINALYSIS, ROUTINE W REFLEX MICROSCOPIC  PROCALCITONIN     EKG  ED ECG REPORT I, Blake Divine, the attending physician, personally viewed and interpreted this ECG.   Date: 10/05/2021  EKG Time: 10:52  Rate: 76  Rhythm: normal sinus rhythm  Axis: Normal  Intervals:none  ST&T Change: None  PROCEDURES:  Critical Care performed: Yes, see critical care procedure note(s)  .Critical Care  Performed by: Blake Divine, MD Authorized by: Blake Divine, MD   Critical care provider statement:    Critical care time (minutes):  30   Critical care time was  exclusive of:  Separately billable procedures and treating other patients and teaching time   Critical care was necessary to treat or prevent imminent or life-threatening deterioration of the following conditions:  Sepsis   Critical care was time spent personally by me on the following activities:  Development of treatment plan with patient or surrogate, discussions with consultants, evaluation of patient's response to treatment, examination of patient, ordering and review of laboratory studies, ordering and review of radiographic studies, ordering and performing treatments and interventions, pulse oximetry, re-evaluation of patient's condition and review of old charts   I assumed direction of critical care for this patient from another provider in my specialty: no     Care discussed with: admitting provider      MEDICATIONS ORDERED IN ED: Medications  ceFEPIme (MAXIPIME) 2 g in sodium chloride 0.9 % 100 mL IVPB (2 g Intravenous New Bag/Given 10/05/21 1539)  acetaminophen (TYLENOL) tablet 650 mg (has no administration in time range)    Or  acetaminophen (TYLENOL) suppository 650 mg (has no administration in time range)  ondansetron (ZOFRAN) tablet 4 mg (has no administration in time range)    Or  ondansetron (ZOFRAN) injection 4 mg (has no administration in time range)  metroNIDAZOLE (FLAGYL) IVPB 500 mg (has no administration in time range)  senna-docusate (Senokot-S) tablet 1 tablet (has no administration in time range)  sodium chloride 0.9 % bolus 1,000 mL (has no administration in time range)     IMPRESSION / MDM / [place] / ED COURSE  I reviewed the triage vital signs and the nursing notes.                              76 y.o. male with past medical history of hypertension, CAD, CKD, and colon cancer who presents to the ED complaining of weakness with 2 falls at home after being seen in the ED yesterday for fever and diagnosed with COVID-19.  Patient's presentation is  most consistent with acute presentation with potential threat to life or bodily function.  Differential diagnosis includes, but is not limited to, sepsis, dehydration, electrolyte abnormality, AKI, anemia, febrile neutropenia, respiratory failure.  Patient nontoxic-appearing and in no acute distress, vital signs remarkable for mildly elevated temperature at 99.7 but otherwise reassuring.  He is not in any respiratory distress and maintaining oxygen saturations on room air.  Visit from yesterday was reviewed and patient did test positive for COVID-19 at that time, likely explaining his ongoing weakness.  He was initially offered CT imaging of his head and cervical spine, however declined this upon arrival of the CT tech to his room.  I explained that we cannot exclude acute injury to his head or neck given his fall, patient expresses understanding and still wishes to forego CT imaging.  Repeat labs show stable chronic kidney disease without acute electrolyte abnormality, but also show worsening leukopenia and thrombocytopenia.  Findings reviewed with Dr. Janese Banks of oncology and initial plan was for discharge home, however patient developed temperature of 100.3 and with his worsening leukopenia, there is concern for neutropenic fever.  Dr. Janese Banks recommends admission for broad-spectrum antibiotics as well as blood cultures.  Patient did have gram-positive cocci in 1 of 4 bottles from yesterday's visit, potentially contaminant given only present in 1 bottle.  Case discussed with hospitalist for admission.      FINAL CLINICAL IMPRESSION(S) / ED DIAGNOSES   Final diagnoses:  Generalized weakness  COVID-19  Neutropenic fever ([place])     Rx / DC Orders   ED Discharge Orders          Ordered    nirmatrelvir/ritonavir EUA, renal dosing, (PAXLOVID) 10 x 150 MG & 10 x '100MG'$  TABS  2 times daily        10/05/21 1458             Note:  This document was prepared using Dragon voice recognition software and  may include unintentional dictation errors.   Blake Divine, MD 10/05/21 [phone]

## 2021-10-05 NOTE — Assessment & Plan Note (Addendum)
-  Elevated AST and alk phos, and given that patient does not have respiratory involvement, I have not initiated antiviral at this time - Symptomatic support including incentive spirometry, flutter valve, albuterol nebulizer as appropriate

## 2021-10-05 NOTE — Telephone Encounter (Signed)
[facility] - Client TELEPHONE ADVICE RECORD [facility] Patient Name: Albert Giles Gender: Male DOB: Apr 21, 1945 Age: 76 Y 2 M 8 D Return Phone Number: [phone] (Primary), [phone] (Secondary), [phone] (Alternate) Address: [place] [address] Client [facility] - Client Client Site [place] Provider [name] - MD Contact Type Call Who Is Calling Patient / Member / Family / Caregiver Call Type Triage / Clinical Caller Name Raj Janus Relationship To Patient Daughter Return Phone Number [phone] (Primary) Chief Complaint FEVER - any fever in cancer, chemotherapy, oncology, sickle cell or HIV patient. Reason for Call Symptomatic / Request for [place] states he is a cancer patient and tested positive for COVID. He has a fever of 101.7 Translation No Nurse Assessment Nurse: Altamease Oiler, RN, Adriana Date/Time (Eastern Time): 10/05/2021 9:06:43 AM Confirm and document reason for call. If symptomatic, describe symptoms. ---caller states pt was not feeling well yesterday. had a fever of 101.1. tested + for covid. was at [facility] yesterday. had chest x ray, bloodwork done, everything looked good. today, temp of 101.7, weak, fell. was told to follow up with pcp for paxlovid. pt is on treatment for cancer-chemo Does the patient have any new or worsening symptoms? ---Yes Will a triage be completed? ---Yes Related visit to physician within the last 2 weeks? ---Yes Does the PT have any chronic conditions? (i.e. diabetes, asthma, this includes High risk factors for pregnancy, etc.) ---Yes List chronic conditions. ---colo-rectal cancer kidney failure MI Is this a behavioral health or substance abuse call? ---No Guidelines Guideline Title Affirmed Question Affirmed Notes Nurse Date/Time (Eastern Time) COVID-19  - Diagnosed or Suspected Shock suspected (e.g., cold/pale/ clammy skin, too weak to stand, low BP, rapid pulse) Altamease Oiler, RN, Adriana 10/05/2021 9:10:09 AM PLEASE NOTE: All timestamps contained within this report are represented as [place] Standard Time. CONFIDENTIALTY NOTICE: This fax transmission is intended only for the addressee. It contains information that is legally privileged, confidential or otherwise protected from use or disclosure. If you are not the intended recipient, you are strictly prohibited from reviewing, disclosing, copying using or disseminating any of this information or taking any action in reliance on or regarding this information. If you have received this fax in error, please notify us immediately by telephone so that we can arrange for its return to [place]. Phone: [phone], Toll-Free: [phone], Fax: [phone] Page: 2 of 2 Call Id: [id number] [place]. Time Eilene Ghazi Time) Disposition Final User 10/05/2021 9:01:17 AM Send to Urgent Kay Ricciuti, Royal 10/05/2021 9:12:59 AM Call EMS 911 Now Yes Altamease Oiler, RN, Adriana 10/05/2021 9:13:32 AM 911 Outcome Documentation Altamease Oiler, RN, Adriana Reason: daughter will be taking him to hospital POV Final Disposition 10/05/2021 9:12:59 AM Call EMS 911 Now Yes Altamease Oiler, RN, Fabio Bering Caller Disagree/Comply Disagree Caller Understands Yes PreDisposition InappropriateToAsk Care Advice Given Per Guideline CALL EMS 911 NOW: CARE ADVICE given per COVID-19 - DIAGNOSED OR SUSPECTED (Adult) guideline. Referrals [place]

## 2021-10-05 NOTE — Telephone Encounter (Signed)
Attempted to contact pt.  No answer.  Vm not set up.  Need to get update on pt.

## 2021-10-05 NOTE — Telephone Encounter (Signed)
Per chart review tab pt is presently at [facility] ED. Sending note to Dr Danise Mina and Lattie Haw CMA.

## 2021-10-05 NOTE — ED Notes (Signed)
Transportation requested  

## 2021-10-05 NOTE — ED Notes (Signed)
First Nurse Note: Pt to ED via POV, pt was seen and DC from ED last night. Pt has COVID and has fell this morning and is having increased weakness. Pt is in NAD.

## 2021-10-05 NOTE — Assessment & Plan Note (Addendum)
-   At baseline, with minimal decrease in renal function

## 2021-10-05 NOTE — ED Notes (Signed)
Transportation delayed per Network engineer- ED tech requested for transport

## 2021-10-05 NOTE — Telephone Encounter (Signed)
Patients daughter Lenna Sciara called in on behalf of patient. She stated that he was seen in the er on yesterday,where he tested positive for covid. She went to see him this morning and he's running a fever of 101.7,and he's really weak. He is a cancer patient,so she's worried about the symptoms that he is experiencing now. I sent him to access nurse to be triaged.

## 2021-10-05 NOTE — ED Triage Notes (Signed)
Pt arrives c/o weakness. Pt tested positive for COVID yesterday. Pt fell this morning trying to get out of the bed. Pt did hit the back of his head. Pt has not been eating and drinking normally. Pt is a cancer pt that received treatment about 2 weeks ago. Per family, pt did have fever at home.

## 2021-10-05 NOTE — H&P (Signed)
History and Physical   Albert Giles:[id number] DOB: 08/12/1945 DOA: 10/05/2021  PCP: [name], MD  Outpatient Specialists: Dr. Tasia Catchings Patient coming from: home  I have personally briefly reviewed patient's old medical records in [place].  Chief Concern: Fever, generalized malaise  HPI: [name] is a 76 year old male with history of rectal carcinoma, history of chemotherapy, depression, anxiety, hypertension, GERD, erectile dysfunction, hyperlipidemia, who presents emergency department for chief concerns of generalized weakness malaise.  Initial vitals in the emergency department showed temperature of 99.7, respiration rate of 25, heart rate 77, blood pressure 95/68, SPO2 of 96% on room air.  Serum sodium is 130, potassium 4.3, chloride 100, bicarb 22, BUN of 24, serum creatinine of 2.17, GFR 30, nonfasting glucose 116, WBC 1.5, hemoglobin 12.9, platelets of 66.  High sensitive troponin was 26 and decreased to 20.  UA was positive for trace leukocytes.  Patient tested positive for COVID-19 on 10/04/2021.  ED treatment: Cefepime IV and vancomycin  At bedside, he is able tell me his name, age, current calendar year.  He is able to identify Michae Giles, daughter is at bedside.  He does not appear to be in acute distress.  He reports fever of 101.1 at home. 101.7 today, and muscle aches without improvement, prompting him to present to the emergency department for evaluation.  He reports that his activity level over the last few days has not been at his normal baseline.  He endorses one episode of loose brown stool AM prior to ED presentation.  He denies blood in it or black stools.  He denies nausea, vomiting, chest pain, shortness of breath, abdominal pain, dysuria, hematuria.  He endorses fever and chills.  He denies syncope and loss of consciousness, swelling of his lower extremities.  Social history: He lives at home with his wife. He denies tobacco, etoh,  and recreational drug use. He is retired and formerly worked as a Government social research officer, Building control surveyor.  ROS: Constitutional: no weight change, + fever ENT/Mouth: no sore throat, no rhinorrhea Eyes: no eye pain, no vision changes Cardiovascular: no chest pain, no dyspnea,  no edema, no palpitations Respiratory: no cough, no sputum, no wheezing Gastrointestinal: no nausea, no vomiting, + diarrhea, no constipation Genitourinary: no urinary incontinence, no dysuria, no hematuria Musculoskeletal: no arthralgias, no myalgias Skin: no skin lesions, no pruritus, Neuro: + weakness, no loss of consciousness, no syncope Psych: no anxiety, no depression, + decrease appetite Heme/Lymph: no bruising, no bleeding  ED Course: Discussed with emergency medicine provider, patient requiring hospitalization for chief concerns of neutropenic fever.  Assessment/Plan  Principal Problem:   Sepsis ([place]) Active Problems:   BPH with obstruction/lower urinary tract symptoms   Hyperlipidemia   CAD S/P percutaneous coronary angioplasty   Vitamin B12 deficiency   Iron deficiency anemia   Rectal cancer (HCC)   Essential (primary) hypertension   History of chemotherapy   History of myocardial infarction   Stage 3b chronic kidney disease (CKD) (HCC)   Neutropenic fever ([place])   COVID-19 virus infection   Assessment and Plan:  * Sepsis ([place]) - Blood cultures x2 are in process, elevated procalcitonin - Lactic acid is downtrending - Continue broad-spectrum antibiotic with cefepime, vancomycin, metronidazole - Neutropenic precautions - Admit to inpatient telemetry  COVID-19 virus infection - Elevated AST and alk phos, and given that patient does not have respiratory involvement, I have not initiated antiviral at this time - Symptomatic support including incentive spirometry, flutter valve, albuterol nebulizer as  appropriate  Neutropenic fever ([place]) - Etiology work-up in progress, I suspect this is secondary to COVID-19  infection - Blood cultures x2 collected on admission - 1 set of blood cultures collected on 10/04/2021 was positive for Staphylococcus, suspect this is secondary to contamination - We will continue with broad-spectrum antibiotic including vancomycin, cefepime, and metronidazole - Neutropenic precaution in place - Admit to telemetry medical, inpatient  Stage 3b chronic kidney disease (CKD) ([place]) - At baseline, with minimal decrease in renal function  Rectal cancer ([place]) - Invasive adenocarcinoma, moderately to poorly differentiated status post FOLFOX chemotherapy - Per oncology note on 09/18/2021, patient remains on chemotherapy FOLFIRI+ m-Vasi - Continue outpatient follow-up with oncology  Chart reviewed.   DVT prophylaxis: TED hose; pharmacologic DVT prophylaxis not initiated on admission due to thrombocytopenia Code Status: Full code Diet: Heart healthy Family Communication: Updated daughter Lenna Sciara at bedside with patient's permission Disposition Plan: Pending clinical course Consults called: None at this time Admission status: Telemetry medical, inpatient  Past Medical History:  Diagnosis Date   CAD (coronary artery disease) 06/2014   UA with NSTEMI - cath with 99% prox L circ s/p stent, EF 40% ([place] [place] [facility])   Emphysema    mild   Ex-smoker    Family history of breast cancer    Family history of colon cancer    Family history of pancreatic cancer    NSTEMI (non-ST elevated myocardial infarction) ([place]) 07/13/2014   Past Surgical History:  Procedure Laterality Date   CARDIAC CATHETERIZATION N/A 07/14/2014   Left Heart Cath and Coronary Angiography with stent placement;  Surgeon: Teodoro Spray, MD   CARDIAC CATHETERIZATION N/A 07/14/2014   Coronary Stent Intervention;  Surgeon: Yolonda Kida, MD   COLONOSCOPY WITH PROPOFOL N/A 09/06/2020   Procedure: COLONOSCOPY WITH PROPOFOL;  Surgeon: Lin Landsman, MD;  Location: [facility] ENDOSCOPY;  Service:  Gastroenterology;  Laterality: N/A;   CYSTECTOMY     on back   ESOPHAGOGASTRODUODENOSCOPY N/A 09/06/2020   Procedure: ESOPHAGOGASTRODUODENOSCOPY (EGD);  Surgeon: Lin Landsman, MD;  Location: [facility] ENDOSCOPY;  Service: Gastroenterology;  Laterality: N/A;   ESOPHAGOGASTRODUODENOSCOPY N/A 06/29/2021   Procedure: ESOPHAGOGASTRODUODENOSCOPY (EGD);  Surgeon: Lin Landsman, MD;  Location: [facility] ENDOSCOPY;  Service: Gastroenterology;  Laterality: N/A;   INGUINAL HERNIA REPAIR Right 08/17/04   IR IMAGING GUIDED PORT INSERTION  09/13/2020   Social History:  reports that he quit smoking about 23 years ago. His smoking use included cigarettes. He has a 35.00 pack-year smoking history. He has never used smokeless tobacco. He reports that he does not drink alcohol and does not use drugs.  No Known Allergies Family History  Problem Relation Age of Onset   Cancer Mother 65       colon   Cancer Sister        breast   Crohn's disease Sister    Cancer Daughter        anal   Crohn's disease Niece    CAD Neg Hx    Stroke Neg Hx    Diabetes Neg Hx    Prostate cancer Neg Hx    Kidney cancer Neg Hx    Bladder Cancer Neg Hx    Family history: Family history reviewed and not pertinent.  Prior to Admission medications   Medication Sig Start Date End Date Taking? Authorizing Provider  nirmatrelvir/ritonavir EUA, renal dosing, (PAXLOVID) 10 x 150 MG & 10 x 100MG TABS Take 2 tablets by mouth 2 (two) times daily for 5  days. Patient GFR is 30. Take nirmatrelvir (150 mg) one tablet twice daily for 5 days and ritonavir (100 mg) one tablet twice daily for 5 days. 10/05/21 10/10/21 Yes Blake Divine, MD  aspirin EC 81 MG tablet Take 1 tablet (81 mg total) by mouth daily. 07/15/14   Henreitta Leber, MD  atorvastatin (LIPITOR) 40 MG tablet Take 1 tablet (40 mg total) by mouth daily at 6 PM. 07/15/14   Sainani, Belia Heman, MD  chlorhexidine (PERIDEX) 0.12 % solution Use as directed 15 mLs in the mouth or throat 2  (two) times daily. 10/02/21   Earlie Server, MD  Cholecalciferol (VITAMIN D3) 25 MCG (1000 UT) CAPS Take 1 capsule (1,000 Units total) by mouth daily. 09/13/19   [name], MD  Cyanocobalamin (B-12) 1000 MCG SUBL Place 1 tablet under the tongue daily. 09/13/19   [name], MD  diphenoxylate-atropine (LOMOTIL) 2.5-0.025 MG tablet Take 1 tablet by mouth 4 (four) times daily as needed for diarrhea or loose stools. Take it along with immodium Patient not taking: Reported on 09/18/2021 08/21/21   Cammie Sickle, MD  DULoxetine (CYMBALTA) 30 MG capsule Take 1 capsule (30 mg total) by mouth daily. 05/15/21   Cammie Sickle, MD  ferrous sulfate 324 (65 Fe) MG TBEC Take 1 tablet (325 mg total) by mouth every other day. 06/21/20 10/02/21  [name], MD  lidocaine-prilocaine (EMLA) cream Apply 1 application topically as needed (to port a cath 1 hour prior to each chemotherapy). 09/14/20   Cammie Sickle, MD  metoprolol tartrate (LOPRESSOR) 25 MG tablet Take 1 tablet (25 mg total) by mouth 2 (two) times daily. 07/15/14   Henreitta Leber, MD  ondansetron (ZOFRAN) 8 MG tablet Take 1 tablet (8 mg total) by mouth every 8 (eight) hours as needed for nausea or vomiting. 09/14/20   Cammie Sickle, MD  pantoprazole (PROTONIX) 40 MG tablet TAKE 1 TABLET BY MOUTH TWICE DAILY BEFORE A MEAL 07/09/21   Vanga, Tally Due, MD  prochlorperazine (COMPAZINE) 10 MG tablet Take 1 tablet (10 mg total) by mouth every 6 (six) hours as needed for nausea or vomiting. 09/14/20   Cammie Sickle, MD  tadalafil (CIALIS) 20 MG tablet TAKE ONE TABLET BY MOUTH DAILY AS NEEDED FOR ERECTILE DYSFUNCTION 07/11/21   Zara Council A, PA-C  triamcinolone ointment (KENALOG) 0.5 % Apply 1 Application topically 2 (two) times daily. 09/18/21   Cammie Sickle, MD   Physical Exam: Vitals:   10/05/21 1730 10/05/21 1800 10/05/21 1849 10/05/21 1850  BP: 136/80 (!) 154/84 (!) 148/87   Pulse: 81 90 86   Resp: (!)  29 (!) 25 20 20   Temp:   (!) 100.8 F (38.2 C)   TempSrc:      SpO2: 94% 95% 98%    Constitutional: appears age-appropriate, NAD, calm, comfortable Eyes: PERRL, lids and conjunctivae normal ENMT: Mucous membranes are moist. Posterior pharynx clear of any exudate or lesions. Age-appropriate dentition. Hearing appropriate Neck: normal, supple, no masses, no thyromegaly Respiratory: clear to auscultation bilaterally, no wheezing, no crackles. Normal respiratory effort. No accessory muscle use.  Cardiovascular: Regular rate and rhythm, no murmurs / rubs / gallops. No extremity edema. 2+ pedal pulses. No carotid bruits.  Abdomen: no tenderness, no masses palpated, no hepatosplenomegaly. Bowel sounds positive.  Musculoskeletal: no clubbing / cyanosis. No joint deformity upper and lower extremities. Good ROM, no contractures, no atrophy. Normal muscle tone.  Skin: no rashes, lesions, ulcers. No induration Neurologic: Sensation intact.  Strength 5/5 in all 4.  Psychiatric: Normal judgment and insight. Alert and oriented x 3. Normal mood.   EKG: independently reviewed, showing sinus rhythm with rate of 76, QTc 437  Chest x-ray on Admission: I personally reviewed and I agree with radiologist reading as below.  DG Chest Port 1 View  Result Date: 10/04/2021 CLINICAL DATA:  Questionable sepsis. EXAM: PORTABLE CHEST 1 VIEW COMPARISON:  Chest radiograph dated 07/13/2014 and CT dated 08/14/2021. FINDINGS: Right-sided Port-A-Cath with tip at the cavoatrial junction. There are bibasilar atelectasis. No focal consolidation, pleural effusion, or pneumothorax. There is mild cardiomegaly with mild vascular congestion. Atherosclerotic calcification of the aorta. No acute osseous pathology. IMPRESSION: Mild cardiomegaly with mild vascular congestion. No focal consolidation. Electronically Signed   By: Anner Crete M.D.   On: 10/04/2021 18:53    Labs on Admission: I have personally reviewed following  labs  CBC: Recent Labs  Lab 10/02/21 0946 10/04/21 1741 10/05/21 1147  WBC 3.2* 2.0* 1.5*  NEUTROABS 0.6*  --   --   HGB 14.0 14.1 12.9*  HCT 42.5 43.8 39.7  MCV 100.5* 100.2* 101.5*  PLT 119* 113* 66*   Basic Metabolic Panel: Recent Labs  Lab 10/02/21 0946 10/04/21 1741 10/05/21 1147  NA 133* 130* 130*  K 4.3 4.3 4.3  CL 104 97* 100  CO2 25 25 22   GLUCOSE 98 113* 116*  BUN 13 19 24*  CREATININE 1.68* 2.13* 2.17*  CALCIUM 8.7* 8.9 8.4*   GFR: Estimated Creatinine Clearance: 27.1 mL/min (A) (by C-G formula based on SCr of 2.17 mg/dL (H)).  Liver Function Tests: Recent Labs  Lab 10/02/21 0946 10/05/21 1147  AST 48* 47*  ALT 43 35  ALKPHOS 263* 213*  BILITOT 0.8 1.1  PROT 6.4* 5.8*  ALBUMIN 3.3* 3.0*   CBG: Recent Labs  Lab 10/04/21 1745  GLUCAP 115*   Urine analysis:    Component Value Date/Time   COLORURINE YELLOW (A) 10/04/2021 2114   APPEARANCEUR CLEAR (A) 10/04/2021 2114   APPEARANCEUR Clear 07/05/2020 1302   LABSPEC 1.020 10/04/2021 2114   PHURINE 5.0 10/04/2021 2114   GLUCOSEU NEGATIVE 10/04/2021 2114   GLUCOSEU NEGATIVE 04/03/2015 1516   HGBUR NEGATIVE 10/04/2021 2114   BILIRUBINUR SMALL (A) 10/04/2021 2114   BILIRUBINUR Negative 07/05/2020 1302   KETONESUR TRACE (A) 10/04/2021 2114   PROTEINUR 30 (A) 10/04/2021 2114   UROBILINOGEN 0.2 04/03/2015 1516   NITRITE NEGATIVE 10/04/2021 2114   LEUKOCYTESUR NEGATIVE 10/04/2021 2114   Dr. Tobie Poet [facility]  If 7PM-7AM, please contact overnight-coverage provider If 7AM-7PM, please contact day coverage provider [email or web address]  10/05/2021, 7:20 PM

## 2021-10-05 NOTE — Telephone Encounter (Signed)
Please call this afternoon for update on COVID patient seen at ER todayo. Thanks.

## 2021-10-05 NOTE — ED Notes (Signed)
Pt given dinner tray- pt sleeping at this time.

## 2021-10-05 NOTE — Assessment & Plan Note (Signed)
-   Etiology work-up in progress, I suspect this is secondary to COVID-19 infection - Blood cultures x2 collected on admission - 1 set of blood cultures collected on 10/04/2021 was positive for Staphylococcus, suspect this is secondary to contamination - We will continue with broad-spectrum antibiotic including vancomycin, cefepime, and metronidazole - Neutropenic precaution in place - Admit to telemetry medical, inpatient

## 2021-10-05 NOTE — Assessment & Plan Note (Signed)
-   Invasive adenocarcinoma, moderately to poorly differentiated status post FOLFOX chemotherapy - Per oncology note on 09/18/2021, patient remains on chemotherapy FOLFIRI+ m-Vasi - Continue outpatient follow-up with oncology

## 2021-10-05 NOTE — Hospital Course (Signed)
[name] is a 76 year old male with history of rectal carcinoma, history of chemotherapy, depression, anxiety, hypertension, GERD, erectile dysfunction, hyperlipidemia, who presents emergency department for chief concerns of generalized weakness malaise.  Initial vitals in the emergency department showed temperature of 99.7, respiration rate of 25, heart rate 77, blood pressure 95/68, SPO2 of 96% on room air.  Serum sodium is 130, potassium 4.3, chloride 100, bicarb 22, BUN of 24, serum creatinine of 2.17, GFR 30, nonfasting glucose 116, WBC 1.5, hemoglobin 12.9, platelets of 66.  High sensitive troponin was 26 and decreased to 20.  UA was positive for trace leukocytes.  Patient tested positive for COVID-19 on 10/04/2021.  ED treatment: Cefepime IV and vancomycin

## 2021-10-06 DIAGNOSIS — N170 Acute kidney failure with tubular necrosis: Secondary | ICD-10-CM | POA: Diagnosis not present

## 2021-10-06 DIAGNOSIS — A419 Sepsis, unspecified organism: Secondary | ICD-10-CM | POA: Diagnosis not present

## 2021-10-06 DIAGNOSIS — D709 Neutropenia, unspecified: Secondary | ICD-10-CM | POA: Diagnosis not present

## 2021-10-06 DIAGNOSIS — E872 Acidosis, unspecified: Secondary | ICD-10-CM

## 2021-10-06 DIAGNOSIS — N1832 Chronic kidney disease, stage 3b: Secondary | ICD-10-CM

## 2021-10-06 DIAGNOSIS — U071 COVID-19: Secondary | ICD-10-CM | POA: Diagnosis not present

## 2021-10-06 DIAGNOSIS — E871 Hypo-osmolality and hyponatremia: Secondary | ICD-10-CM

## 2021-10-06 LAB — COMPREHENSIVE METABOLIC PANEL
ALT: 31 U/L (ref 0–44)
AST: 50 U/L — ABNORMAL HIGH (ref 15–41)
Albumin: 2.7 g/dL — ABNORMAL LOW (ref 3.5–5.0)
Alkaline Phosphatase: 207 U/L — ABNORMAL HIGH (ref 38–126)
Anion gap: 7 (ref 5–15)
BUN: 22 mg/dL (ref 8–23)
CO2: 20 mmol/L — ABNORMAL LOW (ref 22–32)
Calcium: 8.3 mg/dL — ABNORMAL LOW (ref 8.9–10.3)
Chloride: 104 mmol/L (ref 98–111)
Creatinine, Ser: 1.8 mg/dL — ABNORMAL HIGH (ref 0.61–1.24)
GFR, Estimated: 38 mL/min — ABNORMAL LOW (ref >60)
Glucose, Bld: 96 mg/dL (ref 70–99)
Potassium: 4.4 mmol/L (ref 3.5–5.1)
Sodium: 131 mmol/L — ABNORMAL LOW (ref 135–145)
Total Bilirubin: 1.2 mg/dL (ref 0.3–1.2)
Total Protein: 5.4 g/dL — ABNORMAL LOW (ref 6.5–8.1)

## 2021-10-06 LAB — PROTIME-INR
INR: 1.1 (ref 0.8–1.2)
Prothrombin Time: 14.4 seconds (ref 11.4–15.2)

## 2021-10-06 MED ORDER — NIRMATRELVIR/RITONAVIR (PAXLOVID) TABLET (RENAL DOSING)
2.0000 | ORAL_TABLET | Freq: Two times a day (BID) | ORAL | Status: DC
  Administered 2021-10-06 – 2021-10-08 (×5): 2 via ORAL
  Filled 2021-10-06: qty 20

## 2021-10-06 MED ORDER — SODIUM BICARBONATE 650 MG PO TABS
650.0000 mg | ORAL_TABLET | Freq: Two times a day (BID) | ORAL | Status: DC
  Administered 2021-10-06 – 2021-10-08 (×5): 650 mg via ORAL
  Filled 2021-10-06 (×5): qty 1

## 2021-10-06 MED ORDER — SODIUM CHLORIDE 0.9 % IV SOLN
2.0000 g | Freq: Two times a day (BID) | INTRAVENOUS | Status: DC
  Administered 2021-10-06 – 2021-10-07 (×4): 2 g via INTRAVENOUS
  Filled 2021-10-06 (×5): qty 12.5
  Filled 2021-10-06: qty 2

## 2021-10-06 MED ORDER — SENNOSIDES-DOCUSATE SODIUM 8.6-50 MG PO TABS
1.0000 | ORAL_TABLET | Freq: Every day | ORAL | Status: DC
  Administered 2021-10-06 – 2021-10-07 (×2): 1 via ORAL
  Filled 2021-10-06 (×2): qty 1

## 2021-10-06 NOTE — Evaluation (Signed)
Physical Therapy Evaluation Patient Details Name: Albert Giles MRN: [id number] DOB: 1945/03/01 Today's Date: 10/06/2021  History of Present Illness  Pt is a 76 yo M diagnosed with Covid-19, sepsis, Acute renal failure on chronic kidney disease stage IIIb, Metabolic acidosis, and hyponatremia. PMH includes rectal CA, depression, anxiety, and HTN.   Clinical Impression  Pt was pleasant and motivated to participate during the session and put forth good effort throughout. Pt Ind with all functional tasks and demonstrated good power, effort, control, and stability throughout the session.  No adverse symptoms noted with ambulation many laps in patient's room without AD.  No skilled PT needs identified and pt reported feeling no need for continued PT services.  Will complete PT orders at this time but will reassess pt pending a change in status upon receipt of new PT orders.         Recommendations for follow up therapy are one component of a multi-disciplinary discharge planning process, led by the attending physician.  Recommendations may be updated based on patient status, additional functional criteria and insurance authorization.  Follow Up Recommendations No PT follow up      Assistance Recommended at Discharge Intermittent Supervision/Assistance  Patient can return home with the following  Assist for transportation    Equipment Recommendations None recommended by PT  Recommendations for Other Services       Functional Status Assessment Patient has not had a recent decline in their functional status     Precautions / Restrictions Precautions Precautions: None Restrictions Weight Bearing Restrictions: No      Mobility  Bed Mobility Overal bed mobility: Independent             General bed mobility comments: Good speed and effort    Transfers Overall transfer level: Independent                 General transfer comment: Good eccentric control and stability     Ambulation/Gait Ambulation/Gait assistance: Independent Gait Distance (Feet): 125 Feet Assistive device: None Gait Pattern/deviations: WFL(Within Functional Limits) Gait velocity: WNL     General Gait Details: Pt able to amb many laps in room without AD with good stability including during sharp turns and while navigating tight spaces; no adverse symptoms or LOB  Stairs            Wheelchair Mobility    Modified Rankin (Stroke Patients Only)       Balance Overall balance assessment: No apparent balance deficits (not formally assessed)                                           Pertinent Vitals/Pain Pain Assessment Pain Assessment: No/denies pain    Home Living Family/patient expects to be discharged to:: Private residence Living Arrangements: Spouse/significant other Available Help at Discharge: Family;Available 24 hours/day Type of Home: House Home Access: Stairs to enter Entrance Stairs-Rails: Right;Left;Can reach both Entrance Stairs-Number of Steps: 5   Home Layout: One level Home Equipment: Conservation officer, nature (2 wheels);Cane - quad;Shower seat - built in      Prior Function Prior Level of Function : Independent/Modified Independent             Mobility Comments: Ind amb community distances without an AD, walks 1 mile outdoors 3-4x/wk, one fall in last 6 months secondary to lack of sensation to feet ADLs Comments: Ind with ADLs  Hand Dominance        Extremity/Trunk Assessment   Upper Extremity Assessment Upper Extremity Assessment: Overall WFL for tasks assessed    Lower Extremity Assessment Lower Extremity Assessment: Overall WFL for tasks assessed (h/o decreased sensation to distal BLEs)       Communication   Communication: No difficulties  Cognition Arousal/Alertness: Awake/alert Behavior During Therapy: WFL for tasks assessed/performed Overall Cognitive Status: Within Functional Limits for tasks assessed                                           General Comments      Exercises     Assessment/Plan    PT Assessment Patient does not need any further PT services  PT Problem List         PT Treatment Interventions      PT Goals (Current goals can be found in the Care Plan section)  Acute Rehab PT Goals PT Goal Formulation: All assessment and education complete, DC therapy    Frequency       Co-evaluation               AM-PAC PT "6 Clicks" Mobility  Outcome Measure Help needed turning from your back to your side while in a flat bed without using bedrails?: None Help needed moving from lying on your back to sitting on the side of a flat bed without using bedrails?: None Help needed moving to and from a bed to a chair (including a wheelchair)?: None Help needed standing up from a chair using your arms (e.g., wheelchair or bedside chair)?: None Help needed to walk in hospital room?: None Help needed climbing 3-5 steps with a railing? : None 6 Click Score: 24    End of Session Equipment Utilized During Treatment: Gait belt Activity Tolerance: Patient tolerated treatment well Patient left: in chair;with call bell/phone within reach;with chair alarm set Nurse Communication: Mobility status PT Visit Diagnosis: Muscle weakness (generalized) (M62.81)    Time: 8676-7209 PT Time Calculation (min) (ACUTE ONLY): 31 min   Charges:   PT Evaluation $PT Eval Low Complexity: 1 Low PT Treatments $Therapeutic Activity: 8-22 mins       D. Royetta Asal PT, DPT 10/06/21, 4:24 PM

## 2021-10-06 NOTE — Progress Notes (Signed)
  Progress Note   Patient: Albert Giles PFY:[id number] DOB: 07/05/1945 DOA: 10/05/2021     1 DOS: the patient was seen and examined on 10/06/2021   Brief hospital course: Mr. Kendyl Bissonnette is a 76 year old male with history of rectal carcinoma, history of chemotherapy, depression, anxiety, hypertension, GERD, erectile dysfunction, hyperlipidemia, who presents emergency department for chief concerns of generalized weakness malaise.  Patient last chemotherapy was a week ago. Upon arrival to the hospital, he spiked a fever of 100.8, heart rate went up to 90, respiratory 25.  He has neutropenia with RBC 1.5, thrombocytopenia 66.  Procalcitonin level 4.05.  Creatinine 2.17. Patient is a placed on broad antibiotics for neutropenic fever.  COVID was also positive.  Started Paxlovid on 9/23.   Assessment and Plan: Neutropenic sepsis. COVID-19 virus infection Patient met sepsis criteria as listed above.  Source of infection is unclear, at this point, we will continue broad-spectrum antibiotics with cefepime and vancomycin.  Elevated procalcitonin level indicating a possible bacterial infection other than viral. Patient also has positive COVID infection.  Given patient immunocompromise status, I will treated with Paxlovid.  No contraindication.  Acute renal failure on chronic kidney disease stage IIIb. Hyponatremia Metabolic acidosis. Reviewed prior lab results, patient renal function much worse at time of admission than baseline.  He received fluids in the emergency room, renal functions better now.  He still has some mild metabolic acidosis, will add sodium bicarbonate at a lower dose. Recheck electrolytes tomorrow.  Rectal cancer (HCC) Thrombocytopenia. Outpatient follow-up with PCP and oncology. Thrombocytopenia appears to be due to chemotherapy.      Subjective:  Patient doing well today, no fever since admission.  Denies any short of breath or cough. No abdominal pain nausea vomiting. Denies  any dysuria or hematuria. Denies any skin rash or skin wounds.  Physical Exam: Vitals:   10/05/21 2351 10/06/21 0449 10/06/21 0834 10/06/21 1230  BP:  122/80 128/78 136/88  Pulse:  65 66 68  Resp:  $Remo'20 15 16  'ouzqk$ Temp:  97.7 F (36.5 C) 97.7 F (36.5 C) (!) 97.3 F (36.3 C)  TempSrc:      SpO2:  97% 97% 99%  Weight: 77.6 kg     Height: $Remove'5\' 8"'[id number]$  (1.727 m)      General exam: Appears calm and comfortable  Respiratory system: Clear to auscultation. Respiratory effort normal. Cardiovascular system: S1 & S2 heard, RRR. No JVD, murmurs, rubs, gallops or clicks. No pedal edema. Gastrointestinal system: Abdomen is nondistended, soft and nontender. No organomegaly or masses felt. Normal bowel sounds heard. Central nervous system: Alert and oriented. No focal neurological deficits. Extremities: Symmetric 5 x 5 power. Skin: No rashes, lesions or ulcers Psychiatry: Judgement and insight appear normal. Mood & affect appropriate.   Data Reviewed:  Reviewed all imaging studies as well as lab results.  Compared to prior lab results.  Family Communication: Daughter updated at the bedside.  Disposition: Status is: Inpatient Remains inpatient appropriate because: Severity of disease, IV treatment.  Planned Discharge Destination: Home    Time spent: 35 minutes  Author: Sharen Hones, MD 10/06/2021 1:58 PM  For on call review [email or web address].

## 2021-10-06 NOTE — Consult Note (Signed)
Pharmacy Antibiotic Note  Albert Giles is a 76 y.o. male with PMH of HTN, CAD, CKD and colon cancer admitted on 10/05/2021 with  generalized weakness . Neutropenic fever & Covid+ ISO Rectal CA (on FOLFIRI+m-Vasi).  Pharmacy has been consulted for Vancomycin and Cefepime dosing.  9/23 - Appears to have AKI on CKD.  (Scr 2.17>1.8; Apparent BL ~1.6-1.7) ANC 0.6; Temp 102.8 (9/21) > 100.8 (9/22)> Afeb (9/23)  Plan: Pt continues on Cefepime 2g IV q24h dose adjusted to cefepime 2g IV q12h for renal recovery.  Vancomycin '1750mg'$  x 1 given 9/22 1819; subsequent orders and labs d/c'd by provider. MRSA pcr sent/pending Metronidazole d/c'd on 9/23 after 2 doses  Height: '5\' 8"'$  (172.7 cm) Weight: 77.6 kg (171 lb 1.2 oz) IBW/kg (Calculated) : 68.4  Temp (24hrs), Avg:99.1 F (37.3 C), Min:97.7 F (36.5 C), Max:100.8 F (38.2 C)  Recent Labs  Lab 10/02/21 0946 10/04/21 1741 10/05/21 1147 10/05/21 1537 10/06/21 0635  WBC 3.2* 2.0* 1.5*  --   --   CREATININE 1.68* 2.13* 2.17*  --  1.80*  LATICACIDVEN  --  1.8  --  1.8  3.1*  --      Estimated Creatinine Clearance: 32.7 mL/min (A) (by C-G formula based on SCr of 1.8 mg/dL (H)).    No Known Allergies  Antimicrobials this admission: Vancomycin 9/22 >>  Cefepime 9/22 >>  Flagyl 9/22 >>  Dose adjustments this admission: N/A  Microbiology results: 9/22 BCx: collected 9/22 MRSA PCR: pending  Thank you for allowing pharmacy to be a part of this patient's care.  Lorna Dibble 10/06/2021 9:49 AM

## 2021-10-06 NOTE — Plan of Care (Signed)
  Problem: Fluid Volume: Goal: Hemodynamic stability will improve 10/06/2021 0624 by Rexford Maus, RN Outcome: Progressing 10/06/2021 0050 by Rexford Maus, RN Outcome: Progressing   Problem: Clinical Measurements: Goal: Diagnostic test results will improve 10/06/2021 0624 by Rexford Maus, RN Outcome: Progressing 10/06/2021 0050 by Rexford Maus, RN Outcome: Progressing Goal: Signs and symptoms of infection will decrease 10/06/2021 0624 by Rexford Maus, RN Outcome: Progressing 10/06/2021 0050 by Rexford Maus, RN Outcome: Progressing   Problem: Respiratory: Goal: Ability to maintain adequate ventilation will improve 10/06/2021 0624 by Rexford Maus, RN Outcome: Progressing 10/06/2021 0050 by Rexford Maus, RN Outcome: Progressing   Problem: Education: Goal: Knowledge of General Education information will improve Description: Including pain rating scale, medication(s)/side effects and non-pharmacologic comfort measures 10/06/2021 0624 by Rexford Maus, RN Outcome: Progressing 10/06/2021 0050 by Rexford Maus, RN Outcome: Progressing   Problem: Health Behavior/Discharge Planning: Goal: Ability to manage health-related needs will improve 10/06/2021 0624 by Rexford Maus, RN Outcome: Progressing 10/06/2021 0050 by Rexford Maus, RN Outcome: Progressing   Problem: Clinical Measurements: Goal: Ability to maintain clinical measurements within normal limits will improve 10/06/2021 0624 by Rexford Maus, RN Outcome: Progressing 10/06/2021 0050 by Rexford Maus, RN Outcome: Progressing Goal: Will remain free from infection 10/06/2021 0624 by Rexford Maus, RN Outcome: Progressing 10/06/2021 0050 by Rexford Maus, RN Outcome: Progressing Goal: Diagnostic test results will improve 10/06/2021 0624 by Rexford Maus, RN Outcome: Progressing 10/06/2021 0050 by Rexford Maus, RN Outcome: Progressing Goal: Respiratory complications will improve 10/06/2021 0624 by Rexford Maus, RN Outcome:  Progressing 10/06/2021 0050 by Rexford Maus, RN Outcome: Progressing Goal: Cardiovascular complication will be avoided 10/06/2021 0624 by Rexford Maus, RN Outcome: Progressing 10/06/2021 0050 by Rexford Maus, RN Outcome: Progressing   Problem: Activity: Goal: Risk for activity intolerance will decrease 10/06/2021 0624 by Rexford Maus, RN Outcome: Progressing 10/06/2021 0050 by Rexford Maus, RN Outcome: Progressing   Problem: Nutrition: Goal: Adequate nutrition will be maintained 10/06/2021 0624 by Rexford Maus, RN Outcome: Progressing 10/06/2021 0050 by Rexford Maus, RN Outcome: Progressing   Problem: Coping: Goal: Level of anxiety will decrease 10/06/2021 0624 by Rexford Maus, RN Outcome: Progressing 10/06/2021 0050 by Rexford Maus, RN Outcome: Progressing   Problem: Elimination: Goal: Will not experience complications related to bowel motility 10/06/2021 0624 by Rexford Maus, RN Outcome: Progressing 10/06/2021 0050 by Rexford Maus, RN Outcome: Progressing Goal: Will not experience complications related to urinary retention 10/06/2021 0624 by Rexford Maus, RN Outcome: Progressing 10/06/2021 0050 by Rexford Maus, RN Outcome: Progressing   Problem: Pain Managment: Goal: General experience of comfort will improve 10/06/2021 0624 by Rexford Maus, RN Outcome: Progressing 10/06/2021 0050 by Rexford Maus, RN Outcome: Progressing   Problem: Safety: Goal: Ability to remain free from injury will improve 10/06/2021 0624 by Rexford Maus, RN Outcome: Progressing 10/06/2021 0050 by Rexford Maus, RN Outcome: Progressing   Problem: Skin Integrity: Goal: Risk for impaired skin integrity will decrease 10/06/2021 0624 by Rexford Maus, RN Outcome: Progressing 10/06/2021 0050 by Rexford Maus, RN Outcome: Progressing

## 2021-10-07 DIAGNOSIS — A419 Sepsis, unspecified organism: Secondary | ICD-10-CM | POA: Diagnosis not present

## 2021-10-07 DIAGNOSIS — N170 Acute kidney failure with tubular necrosis: Secondary | ICD-10-CM | POA: Diagnosis not present

## 2021-10-07 DIAGNOSIS — D709 Neutropenia, unspecified: Secondary | ICD-10-CM | POA: Diagnosis not present

## 2021-10-07 DIAGNOSIS — U071 COVID-19: Secondary | ICD-10-CM | POA: Diagnosis not present

## 2021-10-07 LAB — BASIC METABOLIC PANEL
Anion gap: 6 (ref 5–15)
BUN: 19 mg/dL (ref 8–23)
CO2: 21 mmol/L — ABNORMAL LOW (ref 22–32)
Calcium: 8.4 mg/dL — ABNORMAL LOW (ref 8.9–10.3)
Chloride: 105 mmol/L (ref 98–111)
Creatinine, Ser: 1.61 mg/dL — ABNORMAL HIGH (ref 0.61–1.24)
GFR, Estimated: 44 mL/min — ABNORMAL LOW (ref >60)
Glucose, Bld: 91 mg/dL (ref 70–99)
Potassium: 4.2 mmol/L (ref 3.5–5.1)
Sodium: 132 mmol/L — ABNORMAL LOW (ref 135–145)

## 2021-10-07 LAB — CBC WITH DIFFERENTIAL/PLATELET
Abs Immature Granulocytes: 0.02 10*3/uL (ref 0.00–0.07)
Basophils Absolute: 0 10*3/uL (ref 0.0–0.1)
Basophils Relative: 1 %
Eosinophils Absolute: 0.1 10*3/uL (ref 0.0–0.5)
Eosinophils Relative: 2 %
HCT: 40.1 % (ref 39.0–52.0)
Hemoglobin: 13.5 g/dL (ref 13.0–17.0)
Immature Granulocytes: 1 %
Lymphocytes Relative: 30 %
Lymphs Abs: 1.2 10*3/uL (ref 0.7–4.0)
MCH: 32.5 pg (ref 26.0–34.0)
MCHC: 33.7 g/dL (ref 30.0–36.0)
MCV: 96.6 fL (ref 80.0–100.0)
Monocytes Absolute: 0.7 10*3/uL (ref 0.1–1.0)
Monocytes Relative: 18 %
Neutro Abs: 2 10*3/uL (ref 1.7–7.7)
Neutrophils Relative %: 48 %
Platelets: 67 10*3/uL — ABNORMAL LOW (ref 150–400)
RBC: 4.15 MIL/uL — ABNORMAL LOW (ref 4.22–5.81)
RDW: 15.9 % — ABNORMAL HIGH (ref 11.5–15.5)
WBC: 4 10*3/uL (ref 4.0–10.5)
nRBC: 0 % (ref 0.0–0.2)

## 2021-10-07 LAB — PROCALCITONIN: Procalcitonin: 7.21 ng/mL

## 2021-10-07 LAB — CULTURE, BLOOD (ROUTINE X 2): Special Requests: ADEQUATE

## 2021-10-07 LAB — MAGNESIUM: Magnesium: 1.9 mg/dL (ref 1.7–2.4)

## 2021-10-07 NOTE — Progress Notes (Signed)
  Progress Note   Patient: Albert Giles PZX:[id number] DOB: Jul 06, 1945 DOA: 10/05/2021     2 DOS: the patient was seen and examined on 10/07/2021   Brief hospital course: [name] is a 76 year old male with history of rectal carcinoma, history of chemotherapy, depression, anxiety, hypertension, GERD, erectile dysfunction, hyperlipidemia, who presents emergency department for chief concerns of generalized weakness malaise.  Patient last chemotherapy was a week ago. Upon arrival to the hospital, he spiked a fever of 100.8, heart rate went up to 90, respiratory 25.  He has neutropenia with RBC 1.5, thrombocytopenia 66.  Procalcitonin level 4.05.  Creatinine 2.17. Patient is a placed on broad antibiotics for neutropenic fever.  COVID was also positive.  Started Paxlovid on 9/23.   Assessment and Plan:  Neutropenic sepsis. COVID-19 virus infection Patient met sepsis criteria as listed above.  Source of infection is unclear. Blood culture with 1 bottle positive for Staphylococcus haemophilus, most likely due to skin contaminant, repeat culture still negative.  Patient had elevated procalcitonin level, will continue another day of broad-spectrum antibiotics.   White cell count 4.0 today, neutropenia seem to have resolved. Continue Paxlovid for COVID infection.  No hypoxemia.   Acute renal failure on chronic kidney disease stage IIIb. Hyponatremia Metabolic acidosis. Renal function has back to baseline, still has a mild metabolic acidosis.  Continue sodium bicarbonate.   Rectal cancer (HCC) Thrombocytopenia. Outpatient follow-up with PCP and oncology. Thrombocytopenia appears to be due to chemotherapy.       Subjective:  Patient feels much improved, no fever or chills. Good appetite.  Had a bowel movement.  Physical Exam: Vitals:   10/07/21 0026 10/07/21 0623 10/07/21 0853 10/07/21 1222  BP: 123/73 137/79 138/82 (!) 154/86  Pulse: 66 65 64 (!) 59  Resp: _0 Temp: 97.7  F (36.5 C) 98.2 F (36.8 C) 98.3 F (36.8 C) 98.3 F (36.8 C)  TempSrc:      SpO2: 96% 97% 99% 100%  Weight:      Height:       General exam: Appears calm and comfortable  Respiratory system: Clear to auscultation. Respiratory effort normal. Cardiovascular system: S1 & S2 heard, RRR. No JVD, murmurs, rubs, gallops or clicks. No pedal edema. Gastrointestinal system: Abdomen is nondistended, soft and nontender. No organomegaly or masses felt. Normal bowel sounds heard. Central nervous system: Alert and oriented. No focal neurological deficits. Extremities: Symmetric 5 x 5 power. Skin: No rashes, lesions or ulcers Psychiatry: Judgement and insight appear normal. Mood & affect appropriate.   Data Reviewed:  Lab results reviewed.  Family Communication: Daughter updated.  Disposition: Status is: Inpatient Remains inpatient appropriate because: Severity of disease, continue IV treatment.  Planned Discharge Destination: Home with Home Health    Time spent: 35 minutes  Author: Sharen Hones, MD 10/07/2021 12:35 PM  For on call review [email or web address].
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
## 2021-10-07 NOTE — Progress Notes (Signed)
Patient is calm, cooperative, alert and oriented x4. He does not like to be bothered and likes to remain independent. Daughter is at bedside. When I ask him did he need help, patient stated that he can do for himself and did not need any help. Day nurse reported that patient ambulates independently. He refused bed alarm and denied pain. Took night meds w/o issues.

## 2021-10-08 ENCOUNTER — Telehealth: Payer: Self-pay | Admitting: Internal Medicine

## 2021-10-08 DIAGNOSIS — A419 Sepsis, unspecified organism: Secondary | ICD-10-CM | POA: Diagnosis not present

## 2021-10-08 DIAGNOSIS — N1832 Chronic kidney disease, stage 3b: Secondary | ICD-10-CM | POA: Diagnosis not present

## 2021-10-08 DIAGNOSIS — D709 Neutropenia, unspecified: Secondary | ICD-10-CM | POA: Diagnosis not present

## 2021-10-08 DIAGNOSIS — N170 Acute kidney failure with tubular necrosis: Secondary | ICD-10-CM | POA: Diagnosis not present

## 2021-10-08 DIAGNOSIS — U071 COVID-19: Secondary | ICD-10-CM | POA: Diagnosis not present

## 2021-10-08 LAB — BASIC METABOLIC PANEL
Anion gap: 6 (ref 5–15)
BUN: 15 mg/dL (ref 8–23)
CO2: 24 mmol/L (ref 22–32)
Calcium: 8.9 mg/dL (ref 8.9–10.3)
Chloride: 106 mmol/L (ref 98–111)
Creatinine, Ser: 1.47 mg/dL — ABNORMAL HIGH (ref 0.61–1.24)
GFR, Estimated: 49 mL/min — ABNORMAL LOW (ref >60)
Glucose, Bld: 109 mg/dL — ABNORMAL HIGH (ref 70–99)
Potassium: 4.1 mmol/L (ref 3.5–5.1)
Sodium: 136 mmol/L (ref 135–145)

## 2021-10-08 LAB — CBC
HCT: 43.9 % (ref 39.0–52.0)
Hemoglobin: 14.7 g/dL (ref 13.0–17.0)
MCH: 32.5 pg (ref 26.0–34.0)
MCHC: 33.5 g/dL (ref 30.0–36.0)
MCV: 96.9 fL (ref 80.0–100.0)
Platelets: 103 10*3/uL — ABNORMAL LOW (ref 150–400)
RBC: 4.53 MIL/uL (ref 4.22–5.81)
RDW: 16.4 % — ABNORMAL HIGH (ref 11.5–15.5)
WBC: 4.9 10*3/uL (ref 4.0–10.5)
nRBC: 0 % (ref 0.0–0.2)

## 2021-10-08 LAB — PROCALCITONIN: Procalcitonin: 4.18 ng/mL

## 2021-10-08 LAB — MAGNESIUM: Magnesium: 2 mg/dL (ref 1.7–2.4)

## 2021-10-08 MED ORDER — NIRMATRELVIR/RITONAVIR (PAXLOVID) TABLET (RENAL DOSING): 2.0000 | ORAL_TABLET | Freq: Two times a day (BID) | ORAL | 0 refills | Status: AC

## 2021-10-08 MED ORDER — AMOXICILLIN 500 MG PO CAPS: 500.0000 mg | ORAL_CAPSULE | Freq: Two times a day (BID) | ORAL | 0 refills | Status: AC

## 2021-10-08 NOTE — Discharge Summary (Signed)
Physician Discharge Summary   Patient: Albert Giles MRN: [id number] DOB: 1945/04/15  Admit date:     10/05/2021  Discharge date: 10/08/21  Discharge Physician: Sharen Hones   PCP: [name], MD   Recommendations at discharge:   Follow-up with PCP in 1 week. Follow-up with hematology as scheduled.  Discharge Diagnoses: Principal Problem:   Neutropenic sepsis ([place]) Active Problems:   BPH with obstruction/lower urinary tract symptoms   Hyperlipidemia   CAD S/P percutaneous coronary angioplasty   Vitamin B12 deficiency   Iron deficiency anemia   Rectal cancer (HCC)   Essential (primary) hypertension   History of chemotherapy   History of myocardial infarction   Stage 3b chronic kidney disease (CKD) (HCC)   Neutropenic fever ([place])   COVID-19 virus infection   Acute renal failure superimposed on stage 3b chronic kidney disease (HCC)   Hyponatremia   Metabolic acidosis  Resolved Problems:   * No resolved hospital problems. [facility] Course: Albert Giles is a 76 year old male with history of rectal carcinoma, history of chemotherapy, depression, anxiety, hypertension, GERD, erectile dysfunction, hyperlipidemia, who presents emergency department for chief concerns of generalized weakness malaise.  Patient last chemotherapy was a week ago. Upon arrival to the hospital, he spiked a fever of 100.8, heart rate went up to 90, respiratory 25.  He has neutropenia with RBC 1.5, thrombocytopenia 66.  Procalcitonin level 4.05.  Creatinine 2.17. Patient is a placed on broad antibiotics for neutropenic fever.  COVID was also positive.  Started Paxlovid on 9/23.   Assessment and Plan: Neutropenic sepsis. COVID-19 virus infection Patient met sepsis criteria as listed above.  Source of infection is unclear. Blood culture with 1 bottle positive for Staphylococcus haemophilus, most likely due to skin contaminant, repeat culture still negative.  Patient received broad-spectrum  antibiotics.  Still no source of infection.  Patient had elevated procalcitonin level, which is more elevated since white cell count went up.  I performed a literature search, high procalcitonin level is caused by elevated interleukin level.  Since patient white count has normalized, immune system has recovered.  As result, he will have more interleukin.  Elevated procalcitonin level does not reflect the severity of infection in this case.  Patient clinically had improved, as result, he is medically stable to be discharged.  I will continue 5 days of Augmentin for now. Continue Paxlovid for COVID infection.  No hypoxemia.   Acute renal failure on chronic kidney disease stage IIIb. Hyponatremia Metabolic acidosis. Renal function has back to baseline, still had a mild metabolic acidosis, which has normalized today.   Rectal cancer (HCC) Thrombocytopenia. Outpatient follow-up with PCP and oncology. Thrombocytopenia appears to be due to chemotherapy. Platelet count increased to 103 from 66.       Consultants: None Procedures performed: None  Disposition: Home health Diet recommendation:  Discharge Diet Orders (From admission, onward)     Start     Ordered   10/08/21 0000  Diet - low sodium heart healthy        10/08/21 0940           Cardiac diet DISCHARGE MEDICATION: Allergies as of 10/08/2021   No Known Allergies      Medication List     STOP taking these medications    atorvastatin 40 MG tablet Commonly known as: LIPITOR   chlorhexidine 0.12 % solution Commonly known as: Peridex   diphenoxylate-atropine 2.5-0.025 MG tablet Commonly known as: LOMOTIL   DULoxetine 30 MG capsule Commonly  known as: CYMBALTA   lidocaine-prilocaine cream Commonly known as: EMLA   ondansetron 8 MG tablet Commonly known as: ZOFRAN   prochlorperazine 10 MG tablet Commonly known as: COMPAZINE   tadalafil 20 MG tablet Commonly known as: CIALIS       TAKE these medications     amoxicillin 500 MG capsule Commonly known as: AMOXIL Take 1 capsule (500 mg total) by mouth 2 (two) times daily for 5 days.   aspirin EC 81 MG tablet Take 1 tablet (81 mg total) by mouth daily.   B-12 1000 MCG Subl Place 1 tablet under the tongue daily.   ferrous sulfate 324 (65 Fe) MG Tbec Take 1 tablet (325 mg total) by mouth every other day.   metoprolol tartrate 25 MG tablet Commonly known as: LOPRESSOR Take 1 tablet (25 mg total) by mouth 2 (two) times daily.   nirmatrelvir/ritonavir EUA (renal dosing) 10 x 150 MG & 10 x 100MG Tabs Commonly known as: PAXLOVID Take 2 tablets by mouth 2 (two) times daily for 5 days. Complete the remaining dose   pantoprazole 40 MG tablet Commonly known as: PROTONIX TAKE 1 TABLET BY MOUTH TWICE DAILY BEFORE A MEAL   triamcinolone ointment 0.5 % Commonly known as: KENALOG Apply 1 Application topically 2 (two) times daily.   Vitamin D3 25 MCG (1000 UT) Caps Take 1 capsule (1,000 Units total) by mouth daily.        Follow-up Information     [facility] EMERGENCY DEPARTMENT .   Specialty: Emergency Medicine Why: If symptoms worsen Contact information: [place] [id number] [address] [place]        Cammie Sickle, MD. Go in 5 days.   Specialties: Internal Medicine, Oncology Contact information: [place] [address] [phone]         [name], MD Follow up in 1 week(s).   Specialty: Family Medicine Contact information: [place] [address] [phone]                Discharge Exam: Danley Danker Weights   10/05/21 2351  Weight: 77.6 kg   General exam: Appears calm and comfortable  Respiratory system: Clear to auscultation. Respiratory effort normal. Cardiovascular system: S1 & S2 heard, RRR. No JVD, murmurs, rubs, gallops or clicks. No pedal edema. Gastrointestinal system: Abdomen is  nondistended, soft and nontender. No organomegaly or masses felt. Normal bowel sounds heard. Central nervous system: Alert and oriented. No focal neurological deficits. Extremities: Symmetric 5 x 5 power. Skin: No rashes, lesions or ulcers Psychiatry: Judgement and insight appear normal. Mood & affect appropriate.    Condition at discharge: good  The results of significant diagnostics from this hospitalization (including imaging, microbiology, ancillary and laboratory) are listed below for reference.   Imaging Studies: DG Chest Port 1 View  Result Date: 10/04/2021 CLINICAL DATA:  Questionable sepsis. EXAM: PORTABLE CHEST 1 VIEW COMPARISON:  Chest radiograph dated 07/13/2014 and CT dated 08/14/2021. FINDINGS: Right-sided Port-A-Cath with tip at the cavoatrial junction. There are bibasilar atelectasis. No focal consolidation, pleural effusion, or pneumothorax. There is mild cardiomegaly with mild vascular congestion. Atherosclerotic calcification of the aorta. No acute osseous pathology. IMPRESSION: Mild cardiomegaly with mild vascular congestion. No focal consolidation. Electronically Signed   By: Anner Crete M.D.   On: 10/04/2021 18:53    Microbiology: Results for orders placed or performed during the hospital encounter of 10/05/21  Culture, blood (routine x 2)  Status: None (Preliminary result)   Collection Time: 10/05/21  3:37 PM   Specimen: BLOOD RIGHT ARM  Result Value Ref Range Status   Specimen Description BLOOD RIGHT ARM  Final   Special Requests   Final    BOTTLES DRAWN AEROBIC AND ANAEROBIC Blood Culture adequate volume   Culture   Final    NO GROWTH 3 DAYS Performed at [facility], [address] [place], [place] [address]    Report Status PENDING  Incomplete  Culture, blood (routine x 2)     Status: None (Preliminary result)   Collection Time: 10/05/21  3:37 PM   Specimen: BLOOD LEFT ARM  Result Value Ref Range Status   Specimen Description BLOOD LEFT  ARM  Final   Special Requests   Final    BOTTLES DRAWN AEROBIC AND ANAEROBIC Blood Culture adequate volume   Culture   Final    NO GROWTH 3 DAYS Performed at [facility], [address] [place], [place] [address]    Report Status PENDING  Incomplete    Labs: CBC: Recent Labs  Lab 10/02/21 0946 10/04/21 1741 10/05/21 1147 10/07/21 0438 10/08/21 0820  WBC 3.2* 2.0* 1.5* 4.0 4.9  NEUTROABS 0.6*  --   --  2.0  --   HGB 14.0 14.1 12.9* 13.5 14.7  HCT 42.5 43.8 39.7 40.1 43.9  MCV 100.5* 100.2* 101.5* 96.6 96.9  PLT 119* 113* 66* 67* 185*   Basic Metabolic Panel: Recent Labs  Lab 10/04/21 1741 10/05/21 1147 10/06/21 0635 10/07/21 0438 10/08/21 0820  NA 130* 130* 131* 132* 136  K 4.3 4.3 4.4 4.2 4.1  CL 97* 100 104 105 106  CO2 25 22 20* 21* 24  GLUCOSE 113* 116* 96 91 109*  BUN 19 24* _0 CREATININE 2.13* 2.17* 1.80* 1.61* 1.47*  CALCIUM 8.9 8.4* 8.3* 8.4* 8.9  MG  --   --   --  1.9 2.0   Liver Function Tests: Recent Labs  Lab 10/02/21 0946 10/05/21 1147 10/06/21 0635  AST 48* 47* 50*  ALT 43 35 31  ALKPHOS 263* 213* 207*  BILITOT 0.8 1.1 1.2  PROT 6.4* 5.8* 5.4*  ALBUMIN 3.3* 3.0* 2.7*   CBG: Recent Labs  Lab 10/04/21 1745  GLUCAP 115*    Discharge time spent: greater than 30 minutes.  Signed: Sharen Hones, MD [facility] 10/08/2021

## 2021-10-08 NOTE — TOC Initial Note (Signed)
Transition of Care [facility]) - Initial/Assessment Note    Patient Details  Name: Albert Giles MRN: [id number] Date of Birth: 04-19-1945  Transition of Care [facility]) CM/SW Contact:    Laurena Slimmer, RN Phone Number: 10/08/2021, 9:48 AM  Clinical Narrative:                  Transition of Care [facility]) Screening Note   Patient Details  Name: Albert Giles Date of Birth: 08-19-1945   Transition of Care [facility]) CM/SW Contact:    Laurena Slimmer, RN Phone Number: 10/08/2021, 9:48 AM    Transition of Care Department [facility]) has reviewed patient and no TOC needs have been identified at this time. We will continue to monitor patient advancement through interdisciplinary progression rounds. If new patient transition needs arise, please place a TOC consult.          Patient Goals and CMS Choice        Expected Discharge Plan and Services           Expected Discharge Date: 10/08/21                                    Prior Living Arrangements/Services                       Activities of Daily Living Home Assistive Devices/Equipment: None ADL Screening (condition at time of admission) Patient's cognitive ability adequate to safely complete daily activities?: Yes Is the patient deaf or have difficulty hearing?: Yes Does the patient have difficulty seeing, even when wearing glasses/contacts?: Yes Does the patient have difficulty concentrating, remembering, or making decisions?: No Patient able to express need for assistance with ADLs?: Yes Does the patient have difficulty dressing or bathing?: No Independently performs ADLs?: Yes (appropriate for developmental age) Does the patient have difficulty walking or climbing stairs?: Yes Weakness of Legs: Both Weakness of Arms/Hands: None  Permission Sought/Granted                  Emotional Assessment              Admission diagnosis:  Generalized weakness [R53.1] Neutropenic fever ([place]) [D70.9,  R50.81] Sepsis ([place]) [A41.9] COVID-19 [U07.1] Patient Active Problem List   Diagnosis Date Noted   Acute renal failure superimposed on stage 3b chronic kidney disease ([place]) 10/06/2021   Hyponatremia 73/22/0254   Metabolic acidosis 27/06/2374   Neutropenic sepsis ([place]) 10/05/2021   Stage 3b chronic kidney disease (CKD) ([place]) 10/05/2021   Neutropenic fever ([place]) 10/05/2021   COVID-19 virus infection 10/05/2021   Encounter for antineoplastic chemotherapy 10/02/2021   Carcinoma of colon ([place]) 06/15/2021   Essential (primary) hypertension 06/15/2021   Essential hypertension 06/15/2021   History of chemotherapy 06/15/2021   History of myocardial infarction 06/15/2021   Iron deficiency 06/15/2021   Coronary artery disease 06/15/2021   Encounter for other administrative examinations 06/15/2021   Person encountering health services to consult on behalf of another person 06/15/2021   Chemotherapy-induced peripheral neuropathy ([place]) 04/03/2021   Chemotherapy-induced thrombocytopenia 04/03/2021   Genetic testing 11/06/2020   Family history of breast cancer 10/12/2020   Family history of pancreatic cancer 10/12/2020   Rectal cancer ([place]) 09/11/2020   Vitamin D deficiency 06/11/2020   Vitamin B12 deficiency 06/05/2019   Iron deficiency anemia 06/05/2019   Positive colorectal cancer screening using Cologuard test 05/24/2019   Hyperlipidemia 01/14/2017  Hematuria 04/09/2015   BPH with obstruction/lower urinary tract symptoms 03/20/2015   Enlarged prostate with lower urinary tract symptoms (LUTS) 03/20/2015   CKD (chronic kidney disease) stage 3, GFR 30-59 ml/min (HCC) 12/30/2014   CAD S/P percutaneous coronary angioplasty 08/05/2014   Non-ST elevation (NSTEMI) myocardial infarction [facility]) 07/13/2014   CAD (coronary artery disease) 06/15/2014   Medicare annual wellness visit, subsequent 12/27/2013   Health maintenance examination 12/27/2013   Advanced care planning/counseling discussion  12/27/2013   Encounter for general adult medical examination without abnormal findings 12/27/2013   Other specified counseling 12/27/2013   Family history of colon cancer 07/30/2012   Family history of malignant neoplasm of digestive organ 07/30/2012   PCP:  [name], MD Pharmacy:   [facility] [address], [place] - [place] [address] [place] [address] Phone: [phone] Fax: [phone], [place] [address] Phone: [phone] Fax: [phone]     Social Determinants of Health (SDOH) Interventions    Readmission Risk Interventions     No data to display

## 2021-10-08 NOTE — Telephone Encounter (Signed)
Spoke to patient's wife and was advised that he was admitted to the hospital. Patient's wife stated that he is doing better and plans on coming home today.

## 2021-10-08 NOTE — Telephone Encounter (Signed)
PT daughter Albert Giles called. Pt is being discharged from hospital today, he was being treated for COVID. He was diagnosed on 10/04/21. She would like to know how to proceed with his future appts.

## 2021-10-09 ENCOUNTER — Other Ambulatory Visit: Payer: Self-pay | Admitting: Internal Medicine

## 2021-10-09 ENCOUNTER — Inpatient Hospital Stay: Payer: Medicare HMO

## 2021-10-09 ENCOUNTER — Inpatient Hospital Stay: Payer: Medicare HMO | Admitting: Internal Medicine

## 2021-10-09 ENCOUNTER — Telehealth: Payer: Self-pay | Admitting: *Deleted

## 2021-10-09 ENCOUNTER — Telehealth: Payer: Self-pay | Admitting: Internal Medicine

## 2021-10-09 DIAGNOSIS — C20 Malignant neoplasm of rectum: Secondary | ICD-10-CM

## 2021-10-09 LAB — CULTURE, BLOOD (ROUTINE X 2): Culture: NO GROWTH

## 2021-10-09 NOTE — Telephone Encounter (Signed)
Given the recent admission for neutropenic fever sepsis/covid from chemotherapy.  Recommend growth factor support.    Please schedule the patient for chemotherapy on 10/10; and pump DC on 10/12; and undeyca injection.   GB

## 2021-10-09 NOTE — Patient Outreach (Signed)
  Care Coordination [facility] Note Transition Care Management Follow-up Telephone Call Date of discharge and from where: [facility] [phone] How have you been since you were released from the hospital? Doing real good up moving about and had breakfast Any questions or concerns? Yes Since being discharged from hospital and having COVID, how long before I can go out. Items Reviewed: Did the pt receive and understand the discharge instructions provided? Yes  Medications obtained and verified? Yes  Other? No  Any new allergies since your discharge? No  Dietary orders reviewed? Yes Do you have support at home? Yes   Home Care and Equipment/Supplies: Were home health services ordered? no If so, what is the name of the agency? N/a  Has the agency set up a time to come to the patient's home? no Were any new equipment or medical supplies ordered?  No What is the name of the medical supply agency? N/a Were you able to get the supplies/equipment? no Do you have any questions related to the use of the equipment or supplies? No  Functional Questionnaire: (I = Independent and D = Dependent) ADLs: I  Bathing/Dressing- I  Meal Prep- I  Eating- I  Maintaining continence- I  Transferring/Ambulation- I  Managing Meds- I  Follow up appointments reviewed:  PCP Hospital f/u appt confirmed? Yes  Scheduled to see Dr Leo Grosser 10/23/2021 4 PM . [facility] f/u appt confirmed? No   Are transportation arrangements needed? N If their condition worsens, is the pt aware to call PCP or go to the Emergency Dept.? Yes Was the patient provided with contact information for the PCP's office or ED? Yes Was to pt encouraged to call back with questions or concerns? Yes  SDOH assessments and interventions completed:   Yes  Care Coordination Interventions Activated:  Yes   Care Coordination Interventions:  No Care Coordination interventions needed at this time.   Encounter Outcome:  Pt. Visit Completed     [place] [facility] [phone]

## 2021-10-10 ENCOUNTER — Encounter: Payer: Self-pay | Admitting: Internal Medicine

## 2021-10-10 ENCOUNTER — Telehealth: Payer: Self-pay | Admitting: Internal Medicine

## 2021-10-10 LAB — CULTURE, BLOOD (ROUTINE X 2)
Culture: NO GROWTH
Culture: NO GROWTH
Special Requests: ADEQUATE
Special Requests: ADEQUATE

## 2021-10-10 NOTE — Telephone Encounter (Signed)
I tried to reach the patient's daughter with an update for follow-up.  Left a voicemail. GB

## 2021-10-11 ENCOUNTER — Inpatient Hospital Stay: Payer: Medicare HMO

## 2021-10-11 ENCOUNTER — Encounter: Payer: Self-pay | Admitting: Internal Medicine

## 2021-10-22 MED FILL — Dexamethasone Sodium Phosphate Inj 100 MG/10ML: INTRAMUSCULAR | Qty: 1 | Status: AC

## 2021-10-23 ENCOUNTER — Inpatient Hospital Stay: Payer: Medicare HMO

## 2021-10-23 ENCOUNTER — Inpatient Hospital Stay: Payer: Medicare HMO | Attending: Internal Medicine | Admitting: Internal Medicine

## 2021-10-23 ENCOUNTER — Encounter: Payer: Self-pay | Admitting: Internal Medicine

## 2021-10-23 ENCOUNTER — Inpatient Hospital Stay: Payer: Medicare HMO | Admitting: Family Medicine

## 2021-10-23 VITALS — BP 153/90 | HR 60 | Resp 18

## 2021-10-23 VITALS — BP 161/64 | HR 68 | Temp 97.1°F | Resp 18 | Ht 68.0 in | Wt 166.8 lb

## 2021-10-23 DIAGNOSIS — Z79899 Other long term (current) drug therapy: Secondary | ICD-10-CM | POA: Diagnosis not present

## 2021-10-23 DIAGNOSIS — C786 Secondary malignant neoplasm of retroperitoneum and peritoneum: Secondary | ICD-10-CM | POA: Insufficient documentation

## 2021-10-23 DIAGNOSIS — I129 Hypertensive chronic kidney disease with stage 1 through stage 4 chronic kidney disease, or unspecified chronic kidney disease: Secondary | ICD-10-CM | POA: Diagnosis not present

## 2021-10-23 DIAGNOSIS — Z800 Family history of malignant neoplasm of digestive organs: Secondary | ICD-10-CM | POA: Insufficient documentation

## 2021-10-23 DIAGNOSIS — Z803 Family history of malignant neoplasm of breast: Secondary | ICD-10-CM | POA: Insufficient documentation

## 2021-10-23 DIAGNOSIS — D500 Iron deficiency anemia secondary to blood loss (chronic): Secondary | ICD-10-CM | POA: Insufficient documentation

## 2021-10-23 DIAGNOSIS — Z5189 Encounter for other specified aftercare: Secondary | ICD-10-CM | POA: Insufficient documentation

## 2021-10-23 DIAGNOSIS — Z87891 Personal history of nicotine dependence: Secondary | ICD-10-CM | POA: Diagnosis not present

## 2021-10-23 DIAGNOSIS — C7802 Secondary malignant neoplasm of left lung: Secondary | ICD-10-CM | POA: Insufficient documentation

## 2021-10-23 DIAGNOSIS — C7801 Secondary malignant neoplasm of right lung: Secondary | ICD-10-CM | POA: Diagnosis not present

## 2021-10-23 DIAGNOSIS — Z5112 Encounter for antineoplastic immunotherapy: Secondary | ICD-10-CM | POA: Diagnosis present

## 2021-10-23 DIAGNOSIS — D6959 Other secondary thrombocytopenia: Secondary | ICD-10-CM | POA: Insufficient documentation

## 2021-10-23 DIAGNOSIS — T451X5A Adverse effect of antineoplastic and immunosuppressive drugs, initial encounter: Secondary | ICD-10-CM | POA: Insufficient documentation

## 2021-10-23 DIAGNOSIS — C20 Malignant neoplasm of rectum: Secondary | ICD-10-CM

## 2021-10-23 DIAGNOSIS — D709 Neutropenia, unspecified: Secondary | ICD-10-CM | POA: Insufficient documentation

## 2021-10-23 DIAGNOSIS — Z5111 Encounter for antineoplastic chemotherapy: Secondary | ICD-10-CM | POA: Insufficient documentation

## 2021-10-23 DIAGNOSIS — Z7982 Long term (current) use of aspirin: Secondary | ICD-10-CM | POA: Insufficient documentation

## 2021-10-23 DIAGNOSIS — C787 Secondary malignant neoplasm of liver and intrahepatic bile duct: Secondary | ICD-10-CM | POA: Insufficient documentation

## 2021-10-23 DIAGNOSIS — N183 Chronic kidney disease, stage 3 unspecified: Secondary | ICD-10-CM | POA: Insufficient documentation

## 2021-10-23 LAB — COMPREHENSIVE METABOLIC PANEL
ALT: 46 U/L — ABNORMAL HIGH (ref 0–44)
AST: 48 U/L — ABNORMAL HIGH (ref 15–41)
Albumin: 3 g/dL — ABNORMAL LOW (ref 3.5–5.0)
Alkaline Phosphatase: 315 U/L — ABNORMAL HIGH (ref 38–126)
Anion gap: 5 (ref 5–15)
BUN: 14 mg/dL (ref 8–23)
CO2: 25 mmol/L (ref 22–32)
Calcium: 8.2 mg/dL — ABNORMAL LOW (ref 8.9–10.3)
Chloride: 108 mmol/L (ref 98–111)
Creatinine, Ser: 1.62 mg/dL — ABNORMAL HIGH (ref 0.61–1.24)
GFR, Estimated: 43 mL/min — ABNORMAL LOW (ref >60)
Glucose, Bld: 115 mg/dL — ABNORMAL HIGH (ref 70–99)
Potassium: 4.2 mmol/L (ref 3.5–5.1)
Sodium: 138 mmol/L (ref 135–145)
Total Bilirubin: 0.9 mg/dL (ref 0.3–1.2)
Total Protein: 6 g/dL — ABNORMAL LOW (ref 6.5–8.1)

## 2021-10-23 LAB — URINALYSIS, DIPSTICK ONLY
Bilirubin Urine: NEGATIVE
Glucose, UA: NEGATIVE mg/dL
Hgb urine dipstick: NEGATIVE
Ketones, ur: NEGATIVE mg/dL
Leukocytes,Ua: NEGATIVE
Nitrite: NEGATIVE
Protein, ur: NEGATIVE mg/dL
Specific Gravity, Urine: 1.014 (ref 1.005–1.030)
pH: 5 (ref 5.0–8.0)

## 2021-10-23 LAB — CBC WITH DIFFERENTIAL/PLATELET
Abs Immature Granulocytes: 0.01 10*3/uL (ref 0.00–0.07)
Basophils Absolute: 0.1 10*3/uL (ref 0.0–0.1)
Basophils Relative: 2 %
Eosinophils Absolute: 0.6 10*3/uL — ABNORMAL HIGH (ref 0.0–0.5)
Eosinophils Relative: 9 %
HCT: 40.9 % (ref 39.0–52.0)
Hemoglobin: 13.2 g/dL (ref 13.0–17.0)
Immature Granulocytes: 0 %
Lymphocytes Relative: 22 %
Lymphs Abs: 1.5 10*3/uL (ref 0.7–4.0)
MCH: 32.6 pg (ref 26.0–34.0)
MCHC: 32.3 g/dL (ref 30.0–36.0)
MCV: 101 fL — ABNORMAL HIGH (ref 80.0–100.0)
Monocytes Absolute: 0.9 10*3/uL (ref 0.1–1.0)
Monocytes Relative: 13 %
Neutro Abs: 3.8 10*3/uL (ref 1.7–7.7)
Neutrophils Relative %: 54 %
Platelets: 121 10*3/uL — ABNORMAL LOW (ref 150–400)
RBC: 4.05 MIL/uL — ABNORMAL LOW (ref 4.22–5.81)
RDW: 15.7 % — ABNORMAL HIGH (ref 11.5–15.5)
WBC: 7 10*3/uL (ref 4.0–10.5)
nRBC: 0 % (ref 0.0–0.2)

## 2021-10-23 MED ORDER — PALONOSETRON HCL INJECTION 0.25 MG/5ML
0.2500 mg | Freq: Once | INTRAVENOUS | Status: AC
  Administered 2021-10-23: 0.25 mg via INTRAVENOUS
  Filled 2021-10-23: qty 5

## 2021-10-23 MED ORDER — SODIUM CHLORIDE 0.9 % IV SOLN
150.0000 mg/m2 | Freq: Once | INTRAVENOUS | Status: AC
  Administered 2021-10-23: 300 mg via INTRAVENOUS
  Filled 2021-10-23: qty 15

## 2021-10-23 MED ORDER — SODIUM CHLORIDE 0.9 % IV SOLN
Freq: Once | INTRAVENOUS | Status: AC
  Filled 2021-10-23: qty 250

## 2021-10-23 MED ORDER — SODIUM CHLORIDE 0.9 % IV SOLN
2400.0000 mg/m2 | INTRAVENOUS | Status: DC
  Administered 2021-10-23: 4650 mg via INTRAVENOUS
  Filled 2021-10-23: qty 93

## 2021-10-23 MED ORDER — SODIUM CHLORIDE 0.9 % IV SOLN
5.0000 mg/kg | Freq: Once | INTRAVENOUS | Status: AC
  Administered 2021-10-23: 400 mg via INTRAVENOUS
  Filled 2021-10-23: qty 16

## 2021-10-23 MED ORDER — ATROPINE SULFATE 1 MG/ML IV SOLN
0.5000 mg | Freq: Once | INTRAVENOUS | Status: AC | PRN
  Administered 2021-10-23: 0.5 mg via INTRAVENOUS
  Filled 2021-10-23: qty 1

## 2021-10-23 MED ORDER — SODIUM CHLORIDE 0.9 % IV SOLN
10.0000 mg | Freq: Once | INTRAVENOUS | Status: AC
  Administered 2021-10-23: 10 mg via INTRAVENOUS
  Filled 2021-10-23: qty 10
  Filled 2021-10-23: qty 1

## 2021-10-23 NOTE — Assessment & Plan Note (Addendum)
#  STAGE IV-[N-RAS MUTATED] Synchronous primaries a] rectal cancer-10 cm-adenocarcinoma & b] transverse colon-- intramucosal adenoca]; abdominal lymphadenopathy; liver metastases; omental metastases. AUG 1st, 2023- CT CAP-continued increasing multifocal bilateral pulmonary metastasis; no progression of disease anywhere else unchanged wall thickening involving the ascending colon/rectum.  Currently on FOLFIRI+ m-Vasi.     #Proceed with cycle #3-  of FOLFIRI+M-Vasi. Labs today reviewed;  acceptable for treatment today.  ANC- 1.3- monitor for now.  Given the neutropenic fever/sepsis recommend undeyca injection. Growth factor  would be given as prophylaxis for chemotherapy-induced neutropenia to prevent febrile neutropenias. Discussed potential side effect- myalgias/arthralgias- recommend Claritin for 4 days.   # Neutropenia/sepsis: COVID infection- improved. Monitor closely.  Ordered Udenyca.  # Anemia/iron deficiency hemoglobin ~13-  secondary to chronic GI bleeding/tumor-STABLE  # Thrombocytopenia/secondary chemotherapy/on aspirin-platelets- > 100- -monitor closely- STABLE  # HTN- 150s/94; at home BP reviewed- 110-130-/ DBP80-90s.continue checking BP at home/reviewed the log from home.  Proceed with M-vasi today.STABLE  # PN G-1-2 sec to Oxaliplatin [last 04/02/2021]. discontinued oxaliplatin for now. Poor tolerance to cymbalta 30 mg q day.STABLE  # Chronic kidney disease stage III-[GFR-43 ] - Continue keeping up with hydration.STABLE  * CEA- not marker;  # DISPOSITION:   # FOLFIRI +m-vasi today; pump off after 48 hours - no need to wait for UA  # Follow up in 2 weeks- MD: labs- cbc/cmp;UA; Vit D- 25 OH levels;   FOLFIRI + M-vasi; ; Pump off 48 hours later; Udenyca-- Dr.B
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
## 2021-10-23 NOTE — Patient Instructions (Signed)
[facility] CANCER CTR AT [place]  Discharge Instructions: Thank you for choosing [facility] to provide your oncology and hematology care.  If you have a lab appointment with the [facility], please go directly to the [place] and check in at the registration area.  Wear comfortable clothing and clothing appropriate for easy access to any Portacath or PICC line.   We strive to give you quality time with your provider. You may need to reschedule your appointment if you arrive late (15 or more minutes).  Arriving late affects you and other patients whose appointments are after yours.  Also, if you miss three or more appointments without notifying the office, you may be dismissed from the clinic at the provider's discretion.      For prescription refill requests, have your pharmacy contact our office and allow 72 hours for refills to be completed.    Today you received the following chemotherapy and/or immunotherapy agents Bevacizumab, Irinotecan, 5FU pump      To help prevent nausea and vomiting after your treatment, we encourage you to take your nausea medication as directed.  BELOW ARE SYMPTOMS THAT SHOULD BE REPORTED IMMEDIATELY: *FEVER GREATER THAN 100.4 F (38 C) OR HIGHER *CHILLS OR SWEATING *NAUSEA AND VOMITING THAT IS NOT CONTROLLED WITH YOUR NAUSEA MEDICATION *UNUSUAL SHORTNESS OF BREATH *UNUSUAL BRUISING OR BLEEDING *URINARY PROBLEMS (pain or burning when urinating, or frequent urination) *BOWEL PROBLEMS (unusual diarrhea, constipation, pain near the anus) TENDERNESS IN MOUTH AND THROAT WITH OR WITHOUT PRESENCE OF ULCERS (sore throat, sores in mouth, or a toothache) UNUSUAL RASH, SWELLING OR PAIN  UNUSUAL VAGINAL DISCHARGE OR ITCHING   Items with * indicate a potential emergency and should be followed up as soon as possible or go to the Emergency Department if any problems should occur.  Please show the CHEMOTHERAPY ALERT CARD or IMMUNOTHERAPY  ALERT CARD at check-in to the Emergency Department and triage nurse.  Should you have questions after your visit or need to cancel or reschedule your appointment, please contact [facility] CANCER [place] AT [place]  [phone] and follow the prompts.  Office hours are 8:00 a.m. to 4:30 p.m. Monday - Friday. Please note that voicemails left after 4:00 p.m. may not be returned until the following business day.  We are closed weekends and major holidays. You have access to a nurse at all times for urgent questions. Please call the main number to the clinic [phone] and follow the prompts.  For any non-urgent questions, you may also contact your provider using MyChart. We now offer e-Visits for anyone 92 and older to request care online for non-urgent symptoms. For details visit [email or web address].   Also download the MyChart app! Go to the app store, search "MyChart", open the app, select , and log in with your MyChart username and password.  Masks are optional in the cancer centers. If you would like for your care team to wear a mask while they are taking care of you, please let them know. For doctor visits, patients may have with them one support person who is at least 78 years old. At this time, visitors are not allowed in the infusion area.

## 2021-10-23 NOTE — Progress Notes (Signed)
Patient is feeling better since recent hospitalization. Does have 5 lb wt loss.  Bp 161/94, HR 68

## 2021-10-25 ENCOUNTER — Inpatient Hospital Stay: Payer: Medicare HMO

## 2021-10-25 DIAGNOSIS — C7801 Secondary malignant neoplasm of right lung: Secondary | ICD-10-CM | POA: Diagnosis not present

## 2021-10-25 DIAGNOSIS — C20 Malignant neoplasm of rectum: Secondary | ICD-10-CM | POA: Diagnosis not present

## 2021-10-25 DIAGNOSIS — Z87891 Personal history of nicotine dependence: Secondary | ICD-10-CM | POA: Diagnosis not present

## 2021-10-25 DIAGNOSIS — D500 Iron deficiency anemia secondary to blood loss (chronic): Secondary | ICD-10-CM | POA: Diagnosis not present

## 2021-10-25 DIAGNOSIS — Z79899 Other long term (current) drug therapy: Secondary | ICD-10-CM | POA: Diagnosis not present

## 2021-10-25 DIAGNOSIS — Z800 Family history of malignant neoplasm of digestive organs: Secondary | ICD-10-CM | POA: Diagnosis not present

## 2021-10-25 DIAGNOSIS — N183 Chronic kidney disease, stage 3 unspecified: Secondary | ICD-10-CM | POA: Diagnosis not present

## 2021-10-25 DIAGNOSIS — T451X5A Adverse effect of antineoplastic and immunosuppressive drugs, initial encounter: Secondary | ICD-10-CM | POA: Diagnosis not present

## 2021-10-25 DIAGNOSIS — Z5189 Encounter for other specified aftercare: Secondary | ICD-10-CM | POA: Diagnosis not present

## 2021-10-25 DIAGNOSIS — D6959 Other secondary thrombocytopenia: Secondary | ICD-10-CM | POA: Diagnosis not present

## 2021-10-25 DIAGNOSIS — C787 Secondary malignant neoplasm of liver and intrahepatic bile duct: Secondary | ICD-10-CM | POA: Diagnosis not present

## 2021-10-25 DIAGNOSIS — C7802 Secondary malignant neoplasm of left lung: Secondary | ICD-10-CM | POA: Diagnosis not present

## 2021-10-25 DIAGNOSIS — D709 Neutropenia, unspecified: Secondary | ICD-10-CM | POA: Diagnosis not present

## 2021-10-25 DIAGNOSIS — Z5111 Encounter for antineoplastic chemotherapy: Secondary | ICD-10-CM | POA: Diagnosis not present

## 2021-10-25 DIAGNOSIS — I129 Hypertensive chronic kidney disease with stage 1 through stage 4 chronic kidney disease, or unspecified chronic kidney disease: Secondary | ICD-10-CM | POA: Diagnosis not present

## 2021-10-25 DIAGNOSIS — Z7982 Long term (current) use of aspirin: Secondary | ICD-10-CM | POA: Diagnosis not present

## 2021-10-25 DIAGNOSIS — Z803 Family history of malignant neoplasm of breast: Secondary | ICD-10-CM | POA: Diagnosis not present

## 2021-10-25 DIAGNOSIS — Z5112 Encounter for antineoplastic immunotherapy: Secondary | ICD-10-CM | POA: Diagnosis not present

## 2021-10-25 DIAGNOSIS — C786 Secondary malignant neoplasm of retroperitoneum and peritoneum: Secondary | ICD-10-CM | POA: Diagnosis not present

## 2021-10-25 MED ORDER — SODIUM CHLORIDE 0.9% FLUSH
10.0000 mL | INTRAVENOUS | Status: DC | PRN
  Administered 2021-10-25: 10 mL
  Filled 2021-10-25: qty 10

## 2021-10-25 MED ORDER — HEPARIN SOD (PORK) LOCK FLUSH 100 UNIT/ML IV SOLN
500.0000 [IU] | Freq: Once | INTRAVENOUS | Status: AC | PRN
  Administered 2021-10-25: 500 [IU]
  Filled 2021-10-25: qty 5

## 2021-10-25 MED ORDER — PEGFILGRASTIM-JMDB 6 MG/0.6ML ~~LOC~~ SOSY
6.0000 mg | PREFILLED_SYRINGE | Freq: Once | SUBCUTANEOUS | Status: AC
  Administered 2021-10-25: 6 mg via SUBCUTANEOUS
  Filled 2021-10-25: qty 0.6

## 2021-11-06 ENCOUNTER — Inpatient Hospital Stay (HOSPITAL_BASED_OUTPATIENT_CLINIC_OR_DEPARTMENT_OTHER): Payer: Medicare HMO | Admitting: Internal Medicine

## 2021-11-06 ENCOUNTER — Ambulatory Visit: Payer: Medicare HMO | Admitting: Gastroenterology

## 2021-11-06 ENCOUNTER — Inpatient Hospital Stay: Payer: Medicare HMO

## 2021-11-06 ENCOUNTER — Encounter: Payer: Self-pay | Admitting: Internal Medicine

## 2021-11-06 VITALS — BP 147/85 | HR 69 | Temp 98.4°F | Resp 20 | Wt 166.6 lb

## 2021-11-06 DIAGNOSIS — C20 Malignant neoplasm of rectum: Secondary | ICD-10-CM

## 2021-11-06 DIAGNOSIS — Z87891 Personal history of nicotine dependence: Secondary | ICD-10-CM | POA: Diagnosis not present

## 2021-11-06 DIAGNOSIS — C786 Secondary malignant neoplasm of retroperitoneum and peritoneum: Secondary | ICD-10-CM | POA: Diagnosis not present

## 2021-11-06 DIAGNOSIS — N183 Chronic kidney disease, stage 3 unspecified: Secondary | ICD-10-CM | POA: Diagnosis not present

## 2021-11-06 DIAGNOSIS — Z79899 Other long term (current) drug therapy: Secondary | ICD-10-CM | POA: Diagnosis not present

## 2021-11-06 DIAGNOSIS — I129 Hypertensive chronic kidney disease with stage 1 through stage 4 chronic kidney disease, or unspecified chronic kidney disease: Secondary | ICD-10-CM | POA: Diagnosis not present

## 2021-11-06 DIAGNOSIS — T451X5A Adverse effect of antineoplastic and immunosuppressive drugs, initial encounter: Secondary | ICD-10-CM | POA: Diagnosis not present

## 2021-11-06 DIAGNOSIS — Z5112 Encounter for antineoplastic immunotherapy: Secondary | ICD-10-CM | POA: Diagnosis not present

## 2021-11-06 DIAGNOSIS — C7801 Secondary malignant neoplasm of right lung: Secondary | ICD-10-CM | POA: Diagnosis not present

## 2021-11-06 DIAGNOSIS — C7802 Secondary malignant neoplasm of left lung: Secondary | ICD-10-CM | POA: Diagnosis not present

## 2021-11-06 DIAGNOSIS — D709 Neutropenia, unspecified: Secondary | ICD-10-CM | POA: Diagnosis not present

## 2021-11-06 DIAGNOSIS — Z803 Family history of malignant neoplasm of breast: Secondary | ICD-10-CM | POA: Diagnosis not present

## 2021-11-06 DIAGNOSIS — Z5111 Encounter for antineoplastic chemotherapy: Secondary | ICD-10-CM | POA: Diagnosis not present

## 2021-11-06 DIAGNOSIS — Z7982 Long term (current) use of aspirin: Secondary | ICD-10-CM | POA: Diagnosis not present

## 2021-11-06 DIAGNOSIS — C787 Secondary malignant neoplasm of liver and intrahepatic bile duct: Secondary | ICD-10-CM | POA: Diagnosis not present

## 2021-11-06 DIAGNOSIS — D6959 Other secondary thrombocytopenia: Secondary | ICD-10-CM | POA: Diagnosis not present

## 2021-11-06 DIAGNOSIS — Z5189 Encounter for other specified aftercare: Secondary | ICD-10-CM | POA: Diagnosis not present

## 2021-11-06 DIAGNOSIS — Z800 Family history of malignant neoplasm of digestive organs: Secondary | ICD-10-CM | POA: Diagnosis not present

## 2021-11-06 DIAGNOSIS — D500 Iron deficiency anemia secondary to blood loss (chronic): Secondary | ICD-10-CM | POA: Diagnosis not present

## 2021-11-06 LAB — VITAMIN D 25 HYDROXY (VIT D DEFICIENCY, FRACTURES): Vit D, 25-Hydroxy: 55.23 ng/mL (ref 30–100)

## 2021-11-06 LAB — URINALYSIS, DIPSTICK ONLY
Bilirubin Urine: NEGATIVE
Glucose, UA: NEGATIVE mg/dL
Hgb urine dipstick: NEGATIVE
Ketones, ur: NEGATIVE mg/dL
Leukocytes,Ua: NEGATIVE
Nitrite: NEGATIVE
Protein, ur: NEGATIVE mg/dL
Specific Gravity, Urine: 1.017 (ref 1.005–1.030)
pH: 6 (ref 5.0–8.0)

## 2021-11-06 LAB — COMPREHENSIVE METABOLIC PANEL
ALT: 56 U/L — ABNORMAL HIGH (ref 0–44)
AST: 57 U/L — ABNORMAL HIGH (ref 15–41)
Albumin: 3.1 g/dL — ABNORMAL LOW (ref 3.5–5.0)
Alkaline Phosphatase: 360 U/L — ABNORMAL HIGH (ref 38–126)
Anion gap: 5 (ref 5–15)
BUN: 11 mg/dL (ref 8–23)
CO2: 24 mmol/L (ref 22–32)
Calcium: 8.1 mg/dL — ABNORMAL LOW (ref 8.9–10.3)
Chloride: 107 mmol/L (ref 98–111)
Creatinine, Ser: 1.84 mg/dL — ABNORMAL HIGH (ref 0.61–1.24)
GFR, Estimated: 37 mL/min — ABNORMAL LOW (ref >60)
Glucose, Bld: 137 mg/dL — ABNORMAL HIGH (ref 70–99)
Potassium: 3.6 mmol/L (ref 3.5–5.1)
Sodium: 136 mmol/L (ref 135–145)
Total Bilirubin: 0.5 mg/dL (ref 0.3–1.2)
Total Protein: 6.1 g/dL — ABNORMAL LOW (ref 6.5–8.1)

## 2021-11-06 LAB — CBC WITH DIFFERENTIAL/PLATELET
Abs Immature Granulocytes: 0.06 10*3/uL (ref 0.00–0.07)
Basophils Absolute: 0.1 10*3/uL (ref 0.0–0.1)
Basophils Relative: 1 %
Eosinophils Absolute: 0.5 10*3/uL (ref 0.0–0.5)
Eosinophils Relative: 6 %
HCT: 40.5 % (ref 39.0–52.0)
Hemoglobin: 13 g/dL (ref 13.0–17.0)
Immature Granulocytes: 1 %
Lymphocytes Relative: 18 %
Lymphs Abs: 1.5 10*3/uL (ref 0.7–4.0)
MCH: 32.7 pg (ref 26.0–34.0)
MCHC: 32.1 g/dL (ref 30.0–36.0)
MCV: 101.8 fL — ABNORMAL HIGH (ref 80.0–100.0)
Monocytes Absolute: 0.6 10*3/uL (ref 0.1–1.0)
Monocytes Relative: 7 %
Neutro Abs: 5.9 10*3/uL (ref 1.7–7.7)
Neutrophils Relative %: 67 %
Platelets: 91 10*3/uL — ABNORMAL LOW (ref 150–400)
RBC: 3.98 MIL/uL — ABNORMAL LOW (ref 4.22–5.81)
RDW: 16 % — ABNORMAL HIGH (ref 11.5–15.5)
WBC: 8.7 10*3/uL (ref 4.0–10.5)
nRBC: 0 % (ref 0.0–0.2)

## 2021-11-06 MED ORDER — PALONOSETRON HCL INJECTION 0.25 MG/5ML
0.2500 mg | Freq: Once | INTRAVENOUS | Status: AC
  Administered 2021-11-06: 0.25 mg via INTRAVENOUS
  Filled 2021-11-06: qty 5

## 2021-11-06 MED ORDER — SODIUM CHLORIDE 0.9 % IV SOLN
Freq: Once | INTRAVENOUS | Status: AC
  Filled 2021-11-06: qty 250

## 2021-11-06 MED ORDER — SODIUM CHLORIDE 0.9 % IV SOLN
150.0000 mg/m2 | Freq: Once | INTRAVENOUS | Status: AC
  Administered 2021-11-06: 300 mg via INTRAVENOUS
  Filled 2021-11-06: qty 15

## 2021-11-06 MED ORDER — SODIUM CHLORIDE 0.9 % IV SOLN
10.0000 mg | Freq: Once | INTRAVENOUS | Status: AC
  Administered 2021-11-06: 10 mg via INTRAVENOUS
  Filled 2021-11-06: qty 10

## 2021-11-06 MED ORDER — SODIUM CHLORIDE 0.9 % IV SOLN
2400.0000 mg/m2 | INTRAVENOUS | Status: DC
  Administered 2021-11-06: 4650 mg via INTRAVENOUS
  Filled 2021-11-06: qty 93

## 2021-11-06 MED ORDER — ATROPINE SULFATE 1 MG/ML IV SOLN
0.5000 mg | Freq: Once | INTRAVENOUS | Status: AC | PRN
  Administered 2021-11-06: 0.5 mg via INTRAVENOUS
  Filled 2021-11-06: qty 1

## 2021-11-06 MED ORDER — SODIUM CHLORIDE 0.9 % IV SOLN
5.0000 mg/kg | Freq: Once | INTRAVENOUS | Status: AC
  Administered 2021-11-06: 400 mg via INTRAVENOUS
  Filled 2021-11-06: qty 16
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
## 2021-11-06 NOTE — Patient Instructions (Signed)
[facility] AT [place]  Discharge Instructions: Thank you for choosing [facility] to provide your oncology and hematology care.  If you have a lab appointment with the [facility], please go directly to the [facility] and check in at the registration area.  Wear comfortable clothing and clothing appropriate for easy access to any Portacath or PICC line.   We strive to give you quality time with your provider. You may need to reschedule your appointment if you arrive late (15 or more minutes).  Arriving late affects you and other patients whose appointments are after yours.  Also, if you miss three or more appointments without notifying the office, you may be dismissed from the clinic at the provider's discretion.      For prescription refill requests, have your pharmacy contact our office and allow 72 hours for refills to be completed.    Today you received the following chemotherapy and/or immunotherapy agents MVASI, Camptosar & Adrucil      To help prevent nausea and vomiting after your treatment, we encourage you to take your nausea medication as directed.  BELOW ARE SYMPTOMS THAT SHOULD BE REPORTED IMMEDIATELY: *FEVER GREATER THAN 100.4 F (38 C) OR HIGHER *CHILLS OR SWEATING *NAUSEA AND VOMITING THAT IS NOT CONTROLLED WITH YOUR NAUSEA MEDICATION *UNUSUAL SHORTNESS OF BREATH *UNUSUAL BRUISING OR BLEEDING *URINARY PROBLEMS (pain or burning when urinating, or frequent urination) *BOWEL PROBLEMS (unusual diarrhea, constipation, pain near the anus) TENDERNESS IN MOUTH AND THROAT WITH OR WITHOUT PRESENCE OF ULCERS (sore throat, sores in mouth, or a toothache) UNUSUAL RASH, SWELLING OR PAIN  UNUSUAL VAGINAL DISCHARGE OR ITCHING   Items with * indicate a potential emergency and should be followed up as soon as possible or go to the Emergency Department if any problems should occur.  Please show the CHEMOTHERAPY ALERT CARD or IMMUNOTHERAPY ALERT CARD  at check-in to the Emergency Department and triage nurse.  Should you have questions after your visit or need to cancel or reschedule your appointment, please contact [facility] [place] AT [place]  [phone] and follow the prompts.  Office hours are 8:00 a.m. to 4:30 p.m. Monday - Friday. Please note that voicemails left after 4:00 p.m. may not be returned until the following business day.  We are closed weekends and major holidays. You have access to a nurse at all times for urgent questions. Please call the main number to the clinic [phone] and follow the prompts.  For any non-urgent questions, you may also contact your provider using MyChart. We now offer e-Visits for anyone 50 and older to request care online for non-urgent symptoms. For details visit [email or web address].   Also download the MyChart app! Go to the app store, search "MyChart", open the app, select [facility], and log in with your MyChart username and password.  Masks are optional in the cancer centers. If you would like for your care team to wear a mask while they are taking care of you, please let them know. For doctor visits, patients may have with them one support person who is at least 78 years old. At this time, visitors are not allowed in the infusion area.

## 2021-11-06 NOTE — Assessment & Plan Note (Addendum)
#  STAGE IV-[N-RAS MUTATED] Synchronous primaries a] rectal cancer-10 cm-adenocarcinoma & b] transverse colon-- intramucosal adenoca]; abdominal lymphadenopathy; liver metastases; omental metastases. AUG 1st, 2023- CT CAP-continued increasing multifocal bilateral pulmonary metastasis; no progression of disease anywhere else unchanged wall thickening involving the ascending colon/rectum.  Currently on FOLFIRI+ m-Vasi.     #Proceed with cycle #4-  of FOLFIRI+M-Vasi. Labs today reviewed;  acceptable for treatment today.  Continue Growth factor- D-3 Fulphila.  Discussed that alkaline phosphatase LFTs slightly abnormal [para- 100].  We will plan to get imaging after 6 cycles of chemotherapy.  Will order at next visit.  # Anemia/iron deficiency hemoglobin ~13-  secondary to chronic GI bleeding/tumor-STABLE  # Thrombocytopenia/secondary chemotherapy/on aspirin-platelets- > 90 [HOLD para <80]- -monitor closely- STABLE  # HTN- 150s/94; at home BP reviewed- 110-130-/ DBP80-90s.continue checking BP at home/reviewed the log from home.  Proceed with M-vasi today.STABLE  # PN G-1-2 sec to Oxaliplatin [last 04/02/2021]. discontinued oxaliplatin for now. Poor tolerance to cymbalta 30 mg q day.STABLE   # Chronic kidney disease stage III-[GFR-38; Para >2 ] - Continue keeping up with hydration.STABLE  * CEA- not marker;  # DISPOSITION:  # check UA-  # FOLFIRI +m-vasi today; pump off after 48 hours - no need to wait for UA  # Follow up in 2 weeks- MD: labs- cbc/cmp;UA; Vit D- 25 OH levels;   FOLFIRI + M-vasi; ; Pump off 48 hours later; Fulphila-  Dr.B

## 2021-11-06 NOTE — Progress Notes (Signed)
Patient states he has been constipated since last treatment but it is getting better.

## 2021-11-08 ENCOUNTER — Inpatient Hospital Stay: Payer: Medicare HMO

## 2021-11-08 VITALS — BP 130/80 | HR 73 | Resp 18

## 2021-11-08 DIAGNOSIS — D500 Iron deficiency anemia secondary to blood loss (chronic): Secondary | ICD-10-CM | POA: Diagnosis not present

## 2021-11-08 DIAGNOSIS — Z5112 Encounter for antineoplastic immunotherapy: Secondary | ICD-10-CM | POA: Diagnosis not present

## 2021-11-08 DIAGNOSIS — Z800 Family history of malignant neoplasm of digestive organs: Secondary | ICD-10-CM | POA: Diagnosis not present

## 2021-11-08 DIAGNOSIS — C7801 Secondary malignant neoplasm of right lung: Secondary | ICD-10-CM | POA: Diagnosis not present

## 2021-11-08 DIAGNOSIS — Z5111 Encounter for antineoplastic chemotherapy: Secondary | ICD-10-CM | POA: Diagnosis not present

## 2021-11-08 DIAGNOSIS — N183 Chronic kidney disease, stage 3 unspecified: Secondary | ICD-10-CM | POA: Diagnosis not present

## 2021-11-08 DIAGNOSIS — Z5189 Encounter for other specified aftercare: Secondary | ICD-10-CM | POA: Diagnosis not present

## 2021-11-08 DIAGNOSIS — C20 Malignant neoplasm of rectum: Secondary | ICD-10-CM | POA: Diagnosis not present

## 2021-11-08 DIAGNOSIS — D6959 Other secondary thrombocytopenia: Secondary | ICD-10-CM | POA: Diagnosis not present

## 2021-11-08 DIAGNOSIS — C7802 Secondary malignant neoplasm of left lung: Secondary | ICD-10-CM | POA: Diagnosis not present

## 2021-11-08 DIAGNOSIS — Z87891 Personal history of nicotine dependence: Secondary | ICD-10-CM | POA: Diagnosis not present

## 2021-11-08 DIAGNOSIS — Z803 Family history of malignant neoplasm of breast: Secondary | ICD-10-CM | POA: Diagnosis not present

## 2021-11-08 DIAGNOSIS — Z79899 Other long term (current) drug therapy: Secondary | ICD-10-CM | POA: Diagnosis not present

## 2021-11-08 DIAGNOSIS — T451X5A Adverse effect of antineoplastic and immunosuppressive drugs, initial encounter: Secondary | ICD-10-CM | POA: Diagnosis not present

## 2021-11-08 DIAGNOSIS — C786 Secondary malignant neoplasm of retroperitoneum and peritoneum: Secondary | ICD-10-CM | POA: Diagnosis not present

## 2021-11-08 DIAGNOSIS — D709 Neutropenia, unspecified: Secondary | ICD-10-CM | POA: Diagnosis not present

## 2021-11-08 DIAGNOSIS — C787 Secondary malignant neoplasm of liver and intrahepatic bile duct: Secondary | ICD-10-CM | POA: Diagnosis not present

## 2021-11-08 DIAGNOSIS — I129 Hypertensive chronic kidney disease with stage 1 through stage 4 chronic kidney disease, or unspecified chronic kidney disease: Secondary | ICD-10-CM | POA: Diagnosis not present

## 2021-11-08 DIAGNOSIS — Z7982 Long term (current) use of aspirin: Secondary | ICD-10-CM | POA: Diagnosis not present

## 2021-11-08 MED ORDER — PEGFILGRASTIM-JMDB 6 MG/0.6ML ~~LOC~~ SOSY
6.0000 mg | PREFILLED_SYRINGE | Freq: Once | SUBCUTANEOUS | Status: AC
  Administered 2021-11-08: 6 mg via SUBCUTANEOUS

## 2021-11-08 MED ORDER — SODIUM CHLORIDE 0.9% FLUSH
10.0000 mL | INTRAVENOUS | Status: DC | PRN
  Administered 2021-11-08: 10 mL
  Filled 2021-11-08: qty 10

## 2021-11-08 MED ORDER — HEPARIN SOD (PORK) LOCK FLUSH 100 UNIT/ML IV SOLN
500.0000 [IU] | Freq: Once | INTRAVENOUS | Status: AC | PRN
  Administered 2021-11-08: 500 [IU]
  Filled 2021-11-08: qty 5

## 2021-11-15 ENCOUNTER — Encounter: Payer: Self-pay | Admitting: Internal Medicine

## 2021-11-16 ENCOUNTER — Inpatient Hospital Stay (HOSPITAL_BASED_OUTPATIENT_CLINIC_OR_DEPARTMENT_OTHER): Payer: Medicare HMO | Admitting: Nurse Practitioner

## 2021-11-16 ENCOUNTER — Inpatient Hospital Stay: Payer: Medicare HMO | Attending: Internal Medicine

## 2021-11-16 ENCOUNTER — Other Ambulatory Visit: Payer: Self-pay

## 2021-11-16 ENCOUNTER — Telehealth: Payer: Self-pay | Admitting: *Deleted

## 2021-11-16 ENCOUNTER — Encounter: Payer: Self-pay | Admitting: Nurse Practitioner

## 2021-11-16 VITALS — BP 139/89 | HR 76 | Temp 98.6°F | Resp 20 | Ht 68.0 in | Wt 161.0 lb

## 2021-11-16 DIAGNOSIS — Z5111 Encounter for antineoplastic chemotherapy: Secondary | ICD-10-CM | POA: Insufficient documentation

## 2021-11-16 DIAGNOSIS — C787 Secondary malignant neoplasm of liver and intrahepatic bile duct: Secondary | ICD-10-CM | POA: Insufficient documentation

## 2021-11-16 DIAGNOSIS — C7802 Secondary malignant neoplasm of left lung: Secondary | ICD-10-CM | POA: Insufficient documentation

## 2021-11-16 DIAGNOSIS — C184 Malignant neoplasm of transverse colon: Secondary | ICD-10-CM | POA: Diagnosis not present

## 2021-11-16 DIAGNOSIS — Z7982 Long term (current) use of aspirin: Secondary | ICD-10-CM | POA: Diagnosis not present

## 2021-11-16 DIAGNOSIS — C20 Malignant neoplasm of rectum: Secondary | ICD-10-CM | POA: Insufficient documentation

## 2021-11-16 DIAGNOSIS — I129 Hypertensive chronic kidney disease with stage 1 through stage 4 chronic kidney disease, or unspecified chronic kidney disease: Secondary | ICD-10-CM | POA: Insufficient documentation

## 2021-11-16 DIAGNOSIS — N183 Chronic kidney disease, stage 3 unspecified: Secondary | ICD-10-CM | POA: Insufficient documentation

## 2021-11-16 DIAGNOSIS — R195 Other fecal abnormalities: Secondary | ICD-10-CM

## 2021-11-16 DIAGNOSIS — K922 Gastrointestinal hemorrhage, unspecified: Secondary | ICD-10-CM

## 2021-11-16 DIAGNOSIS — Z79899 Other long term (current) drug therapy: Secondary | ICD-10-CM | POA: Insufficient documentation

## 2021-11-16 DIAGNOSIS — K625 Hemorrhage of anus and rectum: Secondary | ICD-10-CM | POA: Diagnosis not present

## 2021-11-16 DIAGNOSIS — C786 Secondary malignant neoplasm of retroperitoneum and peritoneum: Secondary | ICD-10-CM | POA: Insufficient documentation

## 2021-11-16 DIAGNOSIS — D509 Iron deficiency anemia, unspecified: Secondary | ICD-10-CM | POA: Insufficient documentation

## 2021-11-16 DIAGNOSIS — D696 Thrombocytopenia, unspecified: Secondary | ICD-10-CM | POA: Diagnosis not present

## 2021-11-16 DIAGNOSIS — Z5112 Encounter for antineoplastic immunotherapy: Secondary | ICD-10-CM | POA: Diagnosis not present

## 2021-11-16 LAB — CBC WITH DIFFERENTIAL/PLATELET
Abs Immature Granulocytes: 0.17 10*3/uL — ABNORMAL HIGH (ref 0.00–0.07)
Basophils Absolute: 0.1 10*3/uL (ref 0.0–0.1)
Basophils Relative: 1 %
Eosinophils Absolute: 0.1 10*3/uL (ref 0.0–0.5)
Eosinophils Relative: 2 %
HCT: 40.4 % (ref 39.0–52.0)
Hemoglobin: 13.1 g/dL (ref 13.0–17.0)
Immature Granulocytes: 2 %
Lymphocytes Relative: 17 %
Lymphs Abs: 1.4 10*3/uL (ref 0.7–4.0)
MCH: 32.7 pg (ref 26.0–34.0)
MCHC: 32.4 g/dL (ref 30.0–36.0)
MCV: 100.7 fL — ABNORMAL HIGH (ref 80.0–100.0)
Monocytes Absolute: 0.8 10*3/uL (ref 0.1–1.0)
Monocytes Relative: 10 %
Neutro Abs: 5.6 10*3/uL (ref 1.7–7.7)
Neutrophils Relative %: 68 %
Platelets: 98 10*3/uL — ABNORMAL LOW (ref 150–400)
RBC: 4.01 MIL/uL — ABNORMAL LOW (ref 4.22–5.81)
RDW: 15.8 % — ABNORMAL HIGH (ref 11.5–15.5)
Smear Review: NORMAL
WBC: 8.2 10*3/uL (ref 4.0–10.5)
nRBC: 0 % (ref 0.0–0.2)

## 2021-11-16 LAB — COMPREHENSIVE METABOLIC PANEL
ALT: 33 U/L (ref 0–44)
AST: 34 U/L (ref 15–41)
Albumin: 3.4 g/dL — ABNORMAL LOW (ref 3.5–5.0)
Alkaline Phosphatase: 374 U/L — ABNORMAL HIGH (ref 38–126)
Anion gap: 6 (ref 5–15)
BUN: 17 mg/dL (ref 8–23)
CO2: 24 mmol/L (ref 22–32)
Calcium: 8.6 mg/dL — ABNORMAL LOW (ref 8.9–10.3)
Chloride: 103 mmol/L (ref 98–111)
Creatinine, Ser: 1.7 mg/dL — ABNORMAL HIGH (ref 0.61–1.24)
GFR, Estimated: 41 mL/min — ABNORMAL LOW (ref >60)
Glucose, Bld: 118 mg/dL — ABNORMAL HIGH (ref 70–99)
Potassium: 4.1 mmol/L (ref 3.5–5.1)
Sodium: 133 mmol/L — ABNORMAL LOW (ref 135–145)
Total Bilirubin: 1 mg/dL (ref 0.3–1.2)
Total Protein: 6.4 g/dL — ABNORMAL LOW (ref 6.5–8.1)

## 2021-11-16 LAB — IRON AND TIBC
Iron: 55 ug/dL (ref 45–182)
Saturation Ratios: 17 % — ABNORMAL LOW (ref 17.9–39.5)
TIBC: 333 ug/dL (ref 250–450)
UIBC: 278 ug/dL

## 2021-11-16 LAB — FERRITIN: Ferritin: 333 ng/mL (ref 24–336)

## 2021-11-16 NOTE — Telephone Encounter (Signed)
Apt given to patient for 215 for lab and see Lauren,NP at 245

## 2021-11-16 NOTE — Telephone Encounter (Signed)
Patient daughter called reporting tha patient has been having blood in his stools for past 3 days. She does not know if it is bright red or tarry black or if her has hemorrhoids. She does state that he has had constipation since starting is chemotherapy and is now taking stool softener. I told her I would call patient to get more information from him and she thanked me. I called patient but he did not answer and message said that he does not have voice mail. I will call again later.

## 2021-11-16 NOTE — Telephone Encounter (Signed)
Apt given to patient today at 215 for labs and see Lauren at 245

## 2021-11-16 NOTE — Telephone Encounter (Signed)
Daughter also sent a pt advice request via [place].  Dr. Jacinto Reap recommended [facility] eval the message was forwarded to [facility]
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
## 2021-11-20 ENCOUNTER — Inpatient Hospital Stay (HOSPITAL_BASED_OUTPATIENT_CLINIC_OR_DEPARTMENT_OTHER): Payer: Medicare HMO | Admitting: Oncology

## 2021-11-20 ENCOUNTER — Encounter: Payer: Self-pay | Admitting: Oncology

## 2021-11-20 ENCOUNTER — Inpatient Hospital Stay: Payer: Medicare HMO

## 2021-11-20 VITALS — BP 141/84 | HR 66 | Temp 96.3°F | Resp 18 | Wt 162.1 lb

## 2021-11-20 DIAGNOSIS — Z7982 Long term (current) use of aspirin: Secondary | ICD-10-CM | POA: Diagnosis not present

## 2021-11-20 DIAGNOSIS — Z79899 Other long term (current) drug therapy: Secondary | ICD-10-CM | POA: Diagnosis not present

## 2021-11-20 DIAGNOSIS — I129 Hypertensive chronic kidney disease with stage 1 through stage 4 chronic kidney disease, or unspecified chronic kidney disease: Secondary | ICD-10-CM | POA: Diagnosis not present

## 2021-11-20 DIAGNOSIS — C20 Malignant neoplasm of rectum: Secondary | ICD-10-CM

## 2021-11-20 DIAGNOSIS — C7802 Secondary malignant neoplasm of left lung: Secondary | ICD-10-CM | POA: Diagnosis not present

## 2021-11-20 DIAGNOSIS — C787 Secondary malignant neoplasm of liver and intrahepatic bile duct: Secondary | ICD-10-CM | POA: Diagnosis not present

## 2021-11-20 DIAGNOSIS — D509 Iron deficiency anemia, unspecified: Secondary | ICD-10-CM | POA: Diagnosis not present

## 2021-11-20 DIAGNOSIS — Z5111 Encounter for antineoplastic chemotherapy: Secondary | ICD-10-CM | POA: Diagnosis not present

## 2021-11-20 DIAGNOSIS — Z5112 Encounter for antineoplastic immunotherapy: Secondary | ICD-10-CM | POA: Diagnosis not present

## 2021-11-20 DIAGNOSIS — N183 Chronic kidney disease, stage 3 unspecified: Secondary | ICD-10-CM | POA: Diagnosis not present

## 2021-11-20 DIAGNOSIS — C786 Secondary malignant neoplasm of retroperitoneum and peritoneum: Secondary | ICD-10-CM | POA: Diagnosis not present

## 2021-11-20 DIAGNOSIS — C184 Malignant neoplasm of transverse colon: Secondary | ICD-10-CM | POA: Diagnosis not present

## 2021-11-20 DIAGNOSIS — D696 Thrombocytopenia, unspecified: Secondary | ICD-10-CM | POA: Diagnosis not present

## 2021-11-20 LAB — COMPREHENSIVE METABOLIC PANEL
ALT: 42 U/L (ref 0–44)
AST: 41 U/L (ref 15–41)
Albumin: 3.2 g/dL — ABNORMAL LOW (ref 3.5–5.0)
Alkaline Phosphatase: 393 U/L — ABNORMAL HIGH (ref 38–126)
Anion gap: 8 (ref 5–15)
BUN: 13 mg/dL (ref 8–23)
CO2: 23 mmol/L (ref 22–32)
Calcium: 8.7 mg/dL — ABNORMAL LOW (ref 8.9–10.3)
Chloride: 102 mmol/L (ref 98–111)
Creatinine, Ser: 1.71 mg/dL — ABNORMAL HIGH (ref 0.61–1.24)
GFR, Estimated: 40 mL/min — ABNORMAL LOW (ref >60)
Glucose, Bld: 114 mg/dL — ABNORMAL HIGH (ref 70–99)
Potassium: 4.1 mmol/L (ref 3.5–5.1)
Sodium: 133 mmol/L — ABNORMAL LOW (ref 135–145)
Total Bilirubin: 0.7 mg/dL (ref 0.3–1.2)
Total Protein: 6.1 g/dL — ABNORMAL LOW (ref 6.5–8.1)

## 2021-11-20 LAB — CBC WITH DIFFERENTIAL/PLATELET
Abs Immature Granulocytes: 0.27 K/uL — ABNORMAL HIGH (ref 0.00–0.07)
Basophils Absolute: 0.2 K/uL — ABNORMAL HIGH (ref 0.0–0.1)
Basophils Relative: 1 %
Eosinophils Absolute: 0.4 K/uL (ref 0.0–0.5)
Eosinophils Relative: 3 %
HCT: 40.2 % (ref 39.0–52.0)
Hemoglobin: 13 g/dL (ref 13.0–17.0)
Immature Granulocytes: 2 %
Lymphocytes Relative: 11 %
Lymphs Abs: 1.7 K/uL (ref 0.7–4.0)
MCH: 32.6 pg (ref 26.0–34.0)
MCHC: 32.3 g/dL (ref 30.0–36.0)
MCV: 100.8 fL — ABNORMAL HIGH (ref 80.0–100.0)
Monocytes Absolute: 1.1 K/uL — ABNORMAL HIGH (ref 0.1–1.0)
Monocytes Relative: 8 %
Neutro Abs: 11.2 K/uL — ABNORMAL HIGH (ref 1.7–7.7)
Neutrophils Relative %: 75 %
Platelets: 135 K/uL — ABNORMAL LOW (ref 150–400)
RBC: 3.99 MIL/uL — ABNORMAL LOW (ref 4.22–5.81)
RDW: 16.4 % — ABNORMAL HIGH (ref 11.5–15.5)
WBC: 14.8 K/uL — ABNORMAL HIGH (ref 4.0–10.5)
nRBC: 0 % (ref 0.0–0.2)

## 2021-11-20 LAB — VITAMIN D 25 HYDROXY (VIT D DEFICIENCY, FRACTURES): Vit D, 25-Hydroxy: 43.69 ng/mL (ref 30–100)

## 2021-11-20 LAB — URINALYSIS, DIPSTICK ONLY
Bilirubin Urine: NEGATIVE
Glucose, UA: NEGATIVE mg/dL
Hgb urine dipstick: NEGATIVE
Ketones, ur: NEGATIVE mg/dL
Leukocytes,Ua: NEGATIVE
Nitrite: NEGATIVE
Protein, ur: NEGATIVE mg/dL
Specific Gravity, Urine: 1.01 (ref 1.005–1.030)
pH: 6 (ref 5.0–8.0)

## 2021-11-20 MED ORDER — SODIUM CHLORIDE 0.9 % IV SOLN
2400.0000 mg/m2 | INTRAVENOUS | Status: DC
  Administered 2021-11-20: 4650 mg via INTRAVENOUS
  Filled 2021-11-20: qty 93

## 2021-11-20 MED ORDER — SODIUM CHLORIDE 0.9 % IV SOLN
5.0000 mg/kg | Freq: Once | INTRAVENOUS | Status: AC
  Administered 2021-11-20: 400 mg via INTRAVENOUS
  Filled 2021-11-20: qty 16

## 2021-11-20 MED ORDER — SODIUM CHLORIDE 0.9 % IV SOLN
Freq: Once | INTRAVENOUS | Status: AC
  Filled 2021-11-20: qty 250

## 2021-11-20 MED ORDER — SODIUM CHLORIDE 0.9 % IV SOLN
10.0000 mg | Freq: Once | INTRAVENOUS | Status: AC
  Administered 2021-11-20: 10 mg via INTRAVENOUS
  Filled 2021-11-20: qty 10

## 2021-11-20 MED ORDER — SODIUM CHLORIDE 0.9 % IV SOLN
150.0000 mg/m2 | Freq: Once | INTRAVENOUS | Status: AC
  Administered 2021-11-20: 300 mg via INTRAVENOUS
  Filled 2021-11-20: qty 15

## 2021-11-20 MED ORDER — SODIUM CHLORIDE 0.9 % IV SOLN
Freq: Once | INTRAVENOUS | Status: DC
  Filled 2021-11-20: qty 250

## 2021-11-20 MED ORDER — ATROPINE SULFATE 1 MG/ML IV SOLN
0.5000 mg | Freq: Once | INTRAVENOUS | Status: AC | PRN
  Administered 2021-11-20: 0.5 mg via INTRAVENOUS
  Filled 2021-11-20: qty 1

## 2021-11-20 MED ORDER — SODIUM CHLORIDE 0.9% FLUSH
10.0000 mL | INTRAVENOUS | Status: DC | PRN
  Filled 2021-11-20: qty 10

## 2021-11-20 MED ORDER — PALONOSETRON HCL INJECTION 0.25 MG/5ML
0.2500 mg | Freq: Once | INTRAVENOUS | Status: AC
  Administered 2021-11-20: 0.25 mg via INTRAVENOUS
  Filled 2021-11-20: qty 5

## 2021-11-20 NOTE — Patient Instructions (Signed)
[facility] CANCER CTR AT [place]  Discharge Instructions: Thank you for choosing [facility] to provide your oncology and hematology care.  If you have a lab appointment with the [address], please go directly to the [facility] and check in at the registration area.  Wear comfortable clothing and clothing appropriate for easy access to any Portacath or PICC line.   We strive to give you quality time with your provider. You may need to reschedule your appointment if you arrive late (15 or more minutes).  Arriving late affects you and other patients whose appointments are after yours.  Also, if you miss three or more appointments without notifying the office, you may be dismissed from the clinic at the provider's discretion.      For prescription refill requests, have your pharmacy contact our office and allow 72 hours for refills to be completed.    Today you received the following chemotherapy and/or immunotherapy agents- Avastin, Irinotecan, 5FU      To help prevent nausea and vomiting after your treatment, we encourage you to take your nausea medication as directed.  BELOW ARE SYMPTOMS THAT SHOULD BE REPORTED IMMEDIATELY: *FEVER GREATER THAN 100.4 F (38 C) OR HIGHER *CHILLS OR SWEATING *NAUSEA AND VOMITING THAT IS NOT CONTROLLED WITH YOUR NAUSEA MEDICATION *UNUSUAL SHORTNESS OF BREATH *UNUSUAL BRUISING OR BLEEDING *URINARY PROBLEMS (pain or burning when urinating, or frequent urination) *BOWEL PROBLEMS (unusual diarrhea, constipation, pain near the anus) TENDERNESS IN MOUTH AND THROAT WITH OR WITHOUT PRESENCE OF ULCERS (sore throat, sores in mouth, or a toothache) UNUSUAL RASH, SWELLING OR PAIN  UNUSUAL VAGINAL DISCHARGE OR ITCHING   Items with * indicate a potential emergency and should be followed up as soon as possible or go to the Emergency Department if any problems should occur.  Please show the CHEMOTHERAPY ALERT CARD or IMMUNOTHERAPY ALERT CARD  at check-in to the Emergency Department and triage nurse.  Should you have questions after your visit or need to cancel or reschedule your appointment, please contact [facility] CANCER [place] AT [place]  [phone] and follow the prompts.  Office hours are 8:00 a.m. to 4:30 p.m. Monday - Friday. Please note that voicemails left after 4:00 p.m. may not be returned until the following business day.  We are closed weekends and major holidays. You have access to a nurse at all times for urgent questions. Please call the main number to the clinic [phone] and follow the prompts.  For any non-urgent questions, you may also contact your provider using MyChart. We now offer e-Visits for anyone 86 and older to request care online for non-urgent symptoms. For details visit [email or web address].   Also download the MyChart app! Go to the app store, search "MyChart", open the app, select [place], and log in with your MyChart username and password.  Masks are optional in the cancer centers. If you would like for your care team to wear a mask while they are taking care of you, please let them know. For doctor visits, patients may have with them one support person who is at least 78 years old. At this time, visitors are not allowed in the infusion area.

## 2021-11-20 NOTE — Progress Notes (Signed)
Appetite is improving.He does have neuropathy from treatment. Has some constipation. Over all he thinks he is doing well.

## 2021-11-20 NOTE — Progress Notes (Signed)
[place]  [phone] [phone]  ID: Albert Giles OB: Jun 17, 1945  MR#: [id number]  ZYS#:[id number]  Patient Care Team: [name], MD as PCP - General (Family Medicine) Clent Jacks, RN as Oncology Nurse Navigator Cammie Sickle, MD as Consulting Physician (Oncology)  CHIEF COMPLAINT: Stage IVc rectal cancer  INTERVAL HISTORY: Patient returns to clinic today for further evaluation and consideration of cycle 5 of FOLFIRI plus bevacizumab.  He continues to have a chronic neuropathy that is unchanged, but otherwise feels well.  He is tolerating his treatments without significant side effects.  He has no neurologic complaints.  He denies any recent fevers or illnesses.  He has a good appetite and denies weight loss.  He has no chest pain, shortness of breath, cough, or hemoptysis.  He denies any nausea, vomiting, constipation, or diarrhea.  He has no melena or hematochezia.  He has no urinary complaints.  Patient offers no further specific complaints today.  REVIEW OF SYSTEMS:   Review of Systems  Constitutional: Negative.  Negative for fever, malaise/fatigue and weight loss.  Respiratory: Negative.  Negative for cough, hemoptysis and shortness of breath.   Cardiovascular: Negative.  Negative for chest pain and leg swelling.  Gastrointestinal: Negative.  Negative for abdominal pain.  Genitourinary: Negative.  Negative for dysuria.  Musculoskeletal: Negative.  Negative for back pain.  Skin: Negative.  Negative for rash.  Neurological: Negative.  Negative for dizziness, focal weakness, weakness and headaches.  Psychiatric/Behavioral: Negative.  The patient is not nervous/anxious.     As per HPI. Otherwise, a complete review of systems is negative.  PAST MEDICAL HISTORY: Past Medical History:  Diagnosis Date   CAD (coronary artery disease) 06/2014   UA with NSTEMI - cath with 99% prox L circ s/p stent, EF 40% ([facility], [place] at  [facility])   Emphysema    mild   Ex-smoker    Family history of breast cancer    Family history of colon cancer    Family history of pancreatic cancer    NSTEMI (non-ST elevated myocardial infarction) ([place]) 07/13/2014    PAST SURGICAL HISTORY: Past Surgical History:  Procedure Laterality Date   CARDIAC CATHETERIZATION N/A 07/14/2014   Left Heart Cath and Coronary Angiography with stent placement;  Surgeon: Teodoro Spray, MD   CARDIAC CATHETERIZATION N/A 07/14/2014   Coronary Stent Intervention;  Surgeon: Yolonda Kida, MD   COLONOSCOPY WITH PROPOFOL N/A 09/06/2020   Procedure: COLONOSCOPY WITH PROPOFOL;  Surgeon: Lin Landsman, MD;  Location: [facility] ENDOSCOPY;  Service: Gastroenterology;  Laterality: N/A;   CYSTECTOMY     on back   ESOPHAGOGASTRODUODENOSCOPY N/A 09/06/2020   Procedure: ESOPHAGOGASTRODUODENOSCOPY (EGD);  Surgeon: Lin Landsman, MD;  Location: [facility] ENDOSCOPY;  Service: Gastroenterology;  Laterality: N/A;   ESOPHAGOGASTRODUODENOSCOPY N/A 06/29/2021   Procedure: ESOPHAGOGASTRODUODENOSCOPY (EGD);  Surgeon: Lin Landsman, MD;  Location: [facility] ENDOSCOPY;  Service: Gastroenterology;  Laterality: N/A;   INGUINAL HERNIA REPAIR Right 08/17/04   IR IMAGING GUIDED PORT INSERTION  09/13/2020    FAMILY HISTORY: Family History  Problem Relation Age of Onset   Cancer Mother 2       colon   Cancer Sister        breast   Crohn's disease Sister    Cancer Daughter        anal   Crohn's disease Niece    CAD Neg Hx    Stroke Neg Hx    Diabetes Neg Hx  Prostate cancer Neg Hx    Kidney cancer Neg Hx    Bladder Cancer Neg Hx     ADVANCED DIRECTIVES (Y/N):  N  HEALTH MAINTENANCE: Social History   Tobacco Use   Smoking status: Former    Packs/day: 1.00    Years: 35.00    Total pack years: 35.00    Types: Cigarettes    Quit date: 07/07/1998    Years since quitting: 23.3   Smokeless tobacco: Never  Vaping Use   Vaping Use: Never used  Substance Use Topics    Alcohol use: No   Drug use: No     Colonoscopy:  PAP:  Bone density:  Lipid panel:  No Known Allergies  Current Outpatient Medications  Medication Sig Dispense Refill   aspirin EC 81 MG tablet Take 1 tablet (81 mg total) by mouth daily. 60 tablet 1   Cholecalciferol (VITAMIN D3) 25 MCG (1000 UT) CAPS Take 1 capsule (1,000 Units total) by mouth daily. 30 capsule    Cyanocobalamin (B-12) 1000 MCG SUBL Place 1 tablet under the tongue daily.     docusate sodium (COLACE) 100 MG capsule Take 100 mg by mouth 2 (two) times daily.     ferrous sulfate 324 (65 Fe) MG TBEC Take 1 tablet (325 mg total) by mouth every other day.     metoprolol tartrate (LOPRESSOR) 25 MG tablet Take 1 tablet (25 mg total) by mouth 2 (two) times daily. 60 tablet 1   pantoprazole (PROTONIX) 40 MG tablet TAKE 1 TABLET BY MOUTH TWICE DAILY BEFORE A MEAL 60 tablet 11   triamcinolone ointment (KENALOG) 0.5 % Apply 1 Application topically 2 (two) times daily. 30 g 0   No current facility-administered medications for this visit.   Facility-Administered Medications Ordered in Other Visits  Medication Dose Route Frequency Provider Last Rate Last Admin   0.9 %  sodium chloride infusion   Intravenous Once Cammie Sickle, MD       fluorouracil (ADRUCIL) 4,650 mg in sodium chloride 0.9 % 57 mL chemo infusion  2,400 mg/m2 (Treatment Plan Recorded) Intravenous 1 day or 1 dose Cammie Sickle, MD   Infusion Verify at 11/20/21 1316   sodium chloride flush (NS) 0.9 % injection 10 mL  10 mL Intravenous PRN Charlaine Dalton R, MD   10 mL at 10/09/20 0855   sodium chloride flush (NS) 0.9 % injection 10 mL  10 mL Intracatheter PRN Cammie Sickle, MD        OBJECTIVE: Vitals:   11/20/21 0928  BP: (!) 141/84  Pulse: 66  Resp: 18  Temp: (!) 96.3 F (35.7 C)  SpO2: 98%     Body mass index is 24.65 kg/m.    ECOG FS:0 - Asymptomatic  General: Well-developed, well-nourished, no acute distress. Eyes: Pink  conjunctiva, anicteric sclera. HEENT: Normocephalic, moist mucous membranes. Lungs: No audible wheezing or coughing. Heart: Regular rate and rhythm. Abdomen: Soft, nontender, no obvious distention. Musculoskeletal: No edema, cyanosis, or clubbing. Neuro: Alert, answering all questions appropriately. Cranial nerves grossly intact. Skin: No rashes or petechiae noted. Psych: Normal affect.  LAB RESULTS:  Lab Results  Component Value Date   NA 133 (L) 11/20/2021   K 4.1 11/20/2021   CL 102 11/20/2021   CO2 23 11/20/2021   GLUCOSE 114 (H) 11/20/2021   BUN 13 11/20/2021   CREATININE 1.71 (H) 11/20/2021   CALCIUM 8.7 (L) 11/20/2021   PROT 6.1 (L) 11/20/2021   ALBUMIN 3.2 (L) 11/20/2021  AST 41 11/20/2021   ALT 42 11/20/2021   ALKPHOS 393 (H) 11/20/2021   BILITOT 0.7 11/20/2021   GFRNONAA 40 (L) 11/20/2021   GFRAA 53 (L) 04/24/2015    Lab Results  Component Value Date   WBC 14.8 (H) 11/20/2021   NEUTROABS 11.2 (H) 11/20/2021   HGB 13.0 11/20/2021   HCT 40.2 11/20/2021   MCV 100.8 (H) 11/20/2021   PLT 135 (L) 11/20/2021     STUDIES: No results found.  ASSESSMENT: Stage IVc rectal cancer.  PLAN:    Stage IVc rectal cancer: Patient's most recent imaging on August 16, 2021 reviewed independently with increasing multifocal bilateral pulmonary metastasis, but otherwise unchanged imaging.  Proceed with cycle 5 of FOLFIRI plus bevacizumab today.  Patient also receives Fulphia on day 3 along with his pump off.  Patient wishes to delay his next treatment until after the Thanksgiving holiday, therefore will return to clinic in 3 weeks for further evaluation and consideration of cycle 6. Anemia: Resolved.  Patient's hemoglobin is 13.0 today. Thrombocytopenia: Mild, monitor.  Proceed with treatment as above. Hypertension: Chronic and unchanged.  Continue current medications as prescribed. Chronic renal insufficiency: Chronic and unchanged.  Patient's most recent creatinine is  1.71.  Patient expressed understanding and was in agreement with this plan. He also understands that He can call clinic at any time with any questions, concerns, or complaints.    Cancer Staging  Rectal cancer [facility]) Staging form: Colon and Rectum, AJCC 8th Edition - Clinical: Stage IVC (cT4a, cN1, cM1c) - Signed by Cammie Sickle, MD on 09/28/2020   Lloyd Huger, MD   11/20/2021 1:27 PM

## 2021-11-22 ENCOUNTER — Inpatient Hospital Stay: Payer: Medicare HMO

## 2021-11-22 VITALS — BP 136/82

## 2021-11-22 DIAGNOSIS — N183 Chronic kidney disease, stage 3 unspecified: Secondary | ICD-10-CM | POA: Diagnosis not present

## 2021-11-22 DIAGNOSIS — C786 Secondary malignant neoplasm of retroperitoneum and peritoneum: Secondary | ICD-10-CM | POA: Diagnosis not present

## 2021-11-22 DIAGNOSIS — Z7982 Long term (current) use of aspirin: Secondary | ICD-10-CM | POA: Diagnosis not present

## 2021-11-22 DIAGNOSIS — C7802 Secondary malignant neoplasm of left lung: Secondary | ICD-10-CM | POA: Diagnosis not present

## 2021-11-22 DIAGNOSIS — Z5111 Encounter for antineoplastic chemotherapy: Secondary | ICD-10-CM | POA: Diagnosis not present

## 2021-11-22 DIAGNOSIS — C787 Secondary malignant neoplasm of liver and intrahepatic bile duct: Secondary | ICD-10-CM | POA: Diagnosis not present

## 2021-11-22 DIAGNOSIS — D509 Iron deficiency anemia, unspecified: Secondary | ICD-10-CM | POA: Diagnosis not present

## 2021-11-22 DIAGNOSIS — Z5112 Encounter for antineoplastic immunotherapy: Secondary | ICD-10-CM | POA: Diagnosis not present

## 2021-11-22 DIAGNOSIS — D696 Thrombocytopenia, unspecified: Secondary | ICD-10-CM | POA: Diagnosis not present

## 2021-11-22 DIAGNOSIS — C20 Malignant neoplasm of rectum: Secondary | ICD-10-CM | POA: Diagnosis not present

## 2021-11-22 DIAGNOSIS — Z79899 Other long term (current) drug therapy: Secondary | ICD-10-CM | POA: Diagnosis not present

## 2021-11-22 DIAGNOSIS — C184 Malignant neoplasm of transverse colon: Secondary | ICD-10-CM | POA: Diagnosis not present

## 2021-11-22 DIAGNOSIS — I129 Hypertensive chronic kidney disease with stage 1 through stage 4 chronic kidney disease, or unspecified chronic kidney disease: Secondary | ICD-10-CM | POA: Diagnosis not present

## 2021-11-22 MED ORDER — PEGFILGRASTIM-JMDB 6 MG/0.6ML ~~LOC~~ SOSY
6.0000 mg | PREFILLED_SYRINGE | Freq: Once | SUBCUTANEOUS | Status: AC
  Administered 2021-11-22: 6 mg via SUBCUTANEOUS

## 2021-11-22 MED ORDER — SODIUM CHLORIDE 0.9% FLUSH
10.0000 mL | INTRAVENOUS | Status: DC | PRN
  Administered 2021-11-22: 10 mL
  Filled 2021-11-22: qty 10

## 2021-11-22 MED ORDER — HEPARIN SOD (PORK) LOCK FLUSH 100 UNIT/ML IV SOLN
500.0000 [IU] | Freq: Once | INTRAVENOUS | Status: AC | PRN
  Administered 2021-11-22: 500 [IU]
  Filled 2021-11-22: qty 5

## 2021-12-10 MED FILL — Dexamethasone Sodium Phosphate Inj 100 MG/10ML: INTRAMUSCULAR | Qty: 1 | Status: AC

## 2021-12-11 ENCOUNTER — Encounter: Payer: Self-pay | Admitting: Internal Medicine

## 2021-12-11 ENCOUNTER — Telehealth: Payer: Self-pay

## 2021-12-11 ENCOUNTER — Inpatient Hospital Stay: Payer: Medicare HMO

## 2021-12-11 ENCOUNTER — Inpatient Hospital Stay (HOSPITAL_BASED_OUTPATIENT_CLINIC_OR_DEPARTMENT_OTHER): Payer: Medicare HMO | Admitting: Internal Medicine

## 2021-12-11 ENCOUNTER — Other Ambulatory Visit: Payer: Self-pay

## 2021-12-11 VITALS — BP 124/72 | HR 67 | Temp 98.7°F | Resp 19 | Wt 161.7 lb

## 2021-12-11 DIAGNOSIS — C786 Secondary malignant neoplasm of retroperitoneum and peritoneum: Secondary | ICD-10-CM | POA: Diagnosis not present

## 2021-12-11 DIAGNOSIS — Z5112 Encounter for antineoplastic immunotherapy: Secondary | ICD-10-CM | POA: Diagnosis not present

## 2021-12-11 DIAGNOSIS — I129 Hypertensive chronic kidney disease with stage 1 through stage 4 chronic kidney disease, or unspecified chronic kidney disease: Secondary | ICD-10-CM | POA: Diagnosis not present

## 2021-12-11 DIAGNOSIS — N183 Chronic kidney disease, stage 3 unspecified: Secondary | ICD-10-CM | POA: Diagnosis not present

## 2021-12-11 DIAGNOSIS — K625 Hemorrhage of anus and rectum: Secondary | ICD-10-CM

## 2021-12-11 DIAGNOSIS — C20 Malignant neoplasm of rectum: Secondary | ICD-10-CM

## 2021-12-11 DIAGNOSIS — C7802 Secondary malignant neoplasm of left lung: Secondary | ICD-10-CM | POA: Diagnosis not present

## 2021-12-11 DIAGNOSIS — Z5111 Encounter for antineoplastic chemotherapy: Secondary | ICD-10-CM | POA: Diagnosis not present

## 2021-12-11 DIAGNOSIS — D509 Iron deficiency anemia, unspecified: Secondary | ICD-10-CM | POA: Diagnosis not present

## 2021-12-11 DIAGNOSIS — D696 Thrombocytopenia, unspecified: Secondary | ICD-10-CM | POA: Diagnosis not present

## 2021-12-11 DIAGNOSIS — Z79899 Other long term (current) drug therapy: Secondary | ICD-10-CM | POA: Diagnosis not present

## 2021-12-11 DIAGNOSIS — C787 Secondary malignant neoplasm of liver and intrahepatic bile duct: Secondary | ICD-10-CM | POA: Diagnosis not present

## 2021-12-11 DIAGNOSIS — Z7982 Long term (current) use of aspirin: Secondary | ICD-10-CM | POA: Diagnosis not present

## 2021-12-11 DIAGNOSIS — C184 Malignant neoplasm of transverse colon: Secondary | ICD-10-CM | POA: Diagnosis not present

## 2021-12-11 LAB — CBC WITH DIFFERENTIAL/PLATELET
Abs Immature Granulocytes: 0.06 10*3/uL (ref 0.00–0.07)
Basophils Absolute: 0.1 10*3/uL (ref 0.0–0.1)
Basophils Relative: 1 %
Eosinophils Absolute: 0.3 10*3/uL (ref 0.0–0.5)
Eosinophils Relative: 3 %
HCT: 36.8 % — ABNORMAL LOW (ref 39.0–52.0)
Hemoglobin: 11.9 g/dL — ABNORMAL LOW (ref 13.0–17.0)
Immature Granulocytes: 1 %
Lymphocytes Relative: 12 %
Lymphs Abs: 1.4 10*3/uL (ref 0.7–4.0)
MCH: 33.5 pg (ref 26.0–34.0)
MCHC: 32.3 g/dL (ref 30.0–36.0)
MCV: 103.7 fL — ABNORMAL HIGH (ref 80.0–100.0)
Monocytes Absolute: 1.3 10*3/uL — ABNORMAL HIGH (ref 0.1–1.0)
Monocytes Relative: 11 %
Neutro Abs: 8.5 10*3/uL — ABNORMAL HIGH (ref 1.7–7.7)
Neutrophils Relative %: 72 %
Platelets: 139 10*3/uL — ABNORMAL LOW (ref 150–400)
RBC: 3.55 MIL/uL — ABNORMAL LOW (ref 4.22–5.81)
RDW: 17.7 % — ABNORMAL HIGH (ref 11.5–15.5)
WBC: 11.7 10*3/uL — ABNORMAL HIGH (ref 4.0–10.5)
nRBC: 0 % (ref 0.0–0.2)

## 2021-12-11 LAB — FERRITIN: Ferritin: 103 ng/mL (ref 24–336)

## 2021-12-11 LAB — URINALYSIS, DIPSTICK ONLY
Bilirubin Urine: NEGATIVE
Glucose, UA: NEGATIVE mg/dL
Hgb urine dipstick: NEGATIVE
Ketones, ur: NEGATIVE mg/dL
Leukocytes,Ua: NEGATIVE
Nitrite: NEGATIVE
Protein, ur: NEGATIVE mg/dL
Specific Gravity, Urine: 1.019 (ref 1.005–1.030)
pH: 6 (ref 5.0–8.0)

## 2021-12-11 LAB — COMPREHENSIVE METABOLIC PANEL
ALT: 30 U/L (ref 0–44)
AST: 36 U/L (ref 15–41)
Albumin: 3.2 g/dL — ABNORMAL LOW (ref 3.5–5.0)
Alkaline Phosphatase: 308 U/L — ABNORMAL HIGH (ref 38–126)
Anion gap: 5 (ref 5–15)
BUN: 19 mg/dL (ref 8–23)
CO2: 26 mmol/L (ref 22–32)
Calcium: 8.5 mg/dL — ABNORMAL LOW (ref 8.9–10.3)
Chloride: 104 mmol/L (ref 98–111)
Creatinine, Ser: 1.66 mg/dL — ABNORMAL HIGH (ref 0.61–1.24)
GFR, Estimated: 42 mL/min — ABNORMAL LOW (ref >60)
Glucose, Bld: 132 mg/dL — ABNORMAL HIGH (ref 70–99)
Potassium: 4.2 mmol/L (ref 3.5–5.1)
Sodium: 135 mmol/L (ref 135–145)
Total Bilirubin: 0.5 mg/dL (ref 0.3–1.2)
Total Protein: 6 g/dL — ABNORMAL LOW (ref 6.5–8.1)

## 2021-12-11 LAB — IRON AND TIBC
Iron: 58 ug/dL (ref 45–182)
Saturation Ratios: 18 % (ref 17.9–39.5)
TIBC: 329 ug/dL (ref 250–450)
UIBC: 271 ug/dL

## 2021-12-11 MED ORDER — SODIUM CHLORIDE 0.9 % IV SOLN
Freq: Once | INTRAVENOUS | Status: AC
  Filled 2021-12-11: qty 250

## 2021-12-11 MED ORDER — SODIUM CHLORIDE 0.9 % IV SOLN
2400.0000 mg/m2 | INTRAVENOUS | Status: DC
  Administered 2021-12-11: 4650 mg via INTRAVENOUS
  Filled 2021-12-11: qty 93

## 2021-12-11 MED ORDER — SODIUM CHLORIDE 0.9 % IV SOLN
10.0000 mg | Freq: Once | INTRAVENOUS | Status: AC
  Administered 2021-12-11: 10 mg via INTRAVENOUS
  Filled 2021-12-11: qty 10

## 2021-12-11 MED ORDER — SODIUM CHLORIDE 0.9 % IV SOLN
150.0000 mg/m2 | Freq: Once | INTRAVENOUS | Status: AC
  Administered 2021-12-11: 300 mg via INTRAVENOUS
  Filled 2021-12-11: qty 15

## 2021-12-11 MED ORDER — ATROPINE SULFATE 1 MG/ML IV SOLN
0.5000 mg | Freq: Once | INTRAVENOUS | Status: AC | PRN
  Administered 2021-12-11: 0.5 mg via INTRAVENOUS
  Filled 2021-12-11: qty 1

## 2021-12-11 MED ORDER — PALONOSETRON HCL INJECTION 0.25 MG/5ML
0.2500 mg | Freq: Once | INTRAVENOUS | Status: AC
  Administered 2021-12-11: 0.25 mg via INTRAVENOUS
  Filled 2021-12-11: qty 5

## 2021-12-11 NOTE — Progress Notes (Signed)
Patient has no concerns today. Patient states he has had some rectal bleeding every 2-3 days. This started back in October.

## 2021-12-11 NOTE — Patient Instructions (Signed)
[facility] CANCER CTR AT [place]  Discharge Instructions: Thank you for choosing [facility] to provide your oncology and hematology care.  If you have a lab appointment with the [facility], please go directly to the [facility] and check in at the registration area.  Wear comfortable clothing and clothing appropriate for easy access to any Portacath or PICC line.   We strive to give you quality time with your provider. You may need to reschedule your appointment if you arrive late (15 or more minutes).  Arriving late affects you and other patients whose appointments are after yours.  Also, if you miss three or more appointments without notifying the office, you may be dismissed from the clinic at the provider's discretion.      For prescription refill requests, have your pharmacy contact our office and allow 72 hours for refills to be completed.    Today you received the following chemotherapy and/or immunotherapy agents- irinotecan, fluorauracil      To help prevent nausea and vomiting after your treatment, we encourage you to take your nausea medication as directed.  BELOW ARE SYMPTOMS THAT SHOULD BE REPORTED IMMEDIATELY: *FEVER GREATER THAN 100.4 F (38 C) OR HIGHER *CHILLS OR SWEATING *NAUSEA AND VOMITING THAT IS NOT CONTROLLED WITH YOUR NAUSEA MEDICATION *UNUSUAL SHORTNESS OF BREATH *UNUSUAL BRUISING OR BLEEDING *URINARY PROBLEMS (pain or burning when urinating, or frequent urination) *BOWEL PROBLEMS (unusual diarrhea, constipation, pain near the anus) TENDERNESS IN MOUTH AND THROAT WITH OR WITHOUT PRESENCE OF ULCERS (sore throat, sores in mouth, or a toothache) UNUSUAL RASH, SWELLING OR PAIN  UNUSUAL VAGINAL DISCHARGE OR ITCHING   Items with * indicate a potential emergency and should be followed up as soon as possible or go to the Emergency Department if any problems should occur.  Please show the CHEMOTHERAPY ALERT CARD or IMMUNOTHERAPY ALERT CARD  at check-in to the Emergency Department and triage nurse.  Should you have questions after your visit or need to cancel or reschedule your appointment, please contact [facility] CANCER [place] AT [place]  [phone] and follow the prompts.  Office hours are 8:00 a.m. to 4:30 p.m. Monday - Friday. Please note that voicemails left after 4:00 p.m. may not be returned until the following business day.  We are closed weekends and major holidays. You have access to a nurse at all times for urgent questions. Please call the main number to the clinic [phone] and follow the prompts.  For any non-urgent questions, you may also contact your provider using MyChart. We now offer e-Visits for anyone 35 and older to request care online for non-urgent symptoms. For details visit [email or web address].   Also download the MyChart app! Go to the app store, search "MyChart", open the app, select [facility], and log in with your MyChart username and password.  Masks are optional in the cancer centers. If you would like for your care team to wear a mask while they are taking care of you, please let them know. For doctor visits, patients may have with them one support person who is at least 78 years old. At this time, visitors are not allowed in the infusion area.

## 2021-12-11 NOTE — Assessment & Plan Note (Addendum)
#  STAGE IV-[N-RAS MUTATED] Synchronous primaries a] rectal cancer-10 cm-adenocarcinoma & b] transverse colon-- intramucosal adenoca]; abdominal lymphadenopathy; liver metastases; omental metastases. AUG 1st, 2023- CT CAP-continued increasing multifocal bilateral pulmonary metastasis; no progression of disease anywhere else unchanged wall thickening involving the ascending colon/rectum.  Currently on FOLFIRI+ m-Vasi.     #Proceed with cycle #6. of FOLFIRI; HOWEVER HOLD M-Vasi- see below re: rectal bleeding Labs today reviewed;  acceptable for treatment today.  Continue Growth factor- D-3 Fulphila.  Discussed that alkaline phosphatase LFTs slightly abnormal [para- 100].  We will plan to get imaging after 6 cycles of chemotherapy.  Will order today.   # Rectal bleeding- ? Hemorrhoidal vs rectal cancer- will HOLD off Mvasi today-I have reached out to Dr. Glendora Score further recommendations including possible repeat endoscopy.  # Anemia/iron deficiency hemoglobin ~11.8-  secondary to chronic GI bleeding/tumor-slightly worse; see above. Will check iron studies/ferritin today.   # Thrombocytopenia/secondary chemotherapy/on aspirin-platelets- > 90 [HOLD para <80]- -monitor closely- STABLE  # HTN- 150s/94; at home BP reviewed- 110-130-/ DBP80-90s.continue checking BP at home/reviewed the log from home.  Proceed with M-vasi today. STABLE  # PN G-1-2 sec to Oxaliplatin [last 04/02/2021]. discontinued oxaliplatin for now. Poor tolerance to cymbalta 30 mg q day.STABLE   # Chronic kidney disease stage III-[GFR-38; Para >2 ] - Continue keeping up with hydration.STABLE  * CEA- not marker;  # DISPOSITION:  # FOLFIRI ONLY; HOLD M-vasi today; pump off after 48 hours - no need to wait for UA  # Follow up in 2 weeks- MD: labs- cbc/cmp;UA; FOLFIRI + M-vasi; ; Pump off 48 hours later; Fulphila- CT CAP non-contrast-  Dr.B

## 2021-12-11 NOTE — Patient Instructions (Signed)

## 2021-12-11 NOTE — Telephone Encounter (Signed)
  Albert Giles, please schedule sigmoidoscopy on this patient with rectal cancer, rectal bleeding

## 2021-12-11 NOTE — Telephone Encounter (Signed)
Patient scheduled for 12/20/21--- daughter Lenna Sciara said she has access to pt's Mychart and will be able to access instructions  PPW released via Mychart
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
## 2021-12-13 ENCOUNTER — Inpatient Hospital Stay: Payer: Medicare HMO

## 2021-12-13 DIAGNOSIS — D509 Iron deficiency anemia, unspecified: Secondary | ICD-10-CM | POA: Diagnosis not present

## 2021-12-13 DIAGNOSIS — C20 Malignant neoplasm of rectum: Secondary | ICD-10-CM | POA: Diagnosis not present

## 2021-12-13 DIAGNOSIS — Z79899 Other long term (current) drug therapy: Secondary | ICD-10-CM | POA: Diagnosis not present

## 2021-12-13 DIAGNOSIS — C7802 Secondary malignant neoplasm of left lung: Secondary | ICD-10-CM | POA: Diagnosis not present

## 2021-12-13 DIAGNOSIS — D696 Thrombocytopenia, unspecified: Secondary | ICD-10-CM | POA: Diagnosis not present

## 2021-12-13 DIAGNOSIS — C184 Malignant neoplasm of transverse colon: Secondary | ICD-10-CM | POA: Diagnosis not present

## 2021-12-13 DIAGNOSIS — C787 Secondary malignant neoplasm of liver and intrahepatic bile duct: Secondary | ICD-10-CM | POA: Diagnosis not present

## 2021-12-13 DIAGNOSIS — N183 Chronic kidney disease, stage 3 unspecified: Secondary | ICD-10-CM | POA: Diagnosis not present

## 2021-12-13 DIAGNOSIS — Z5111 Encounter for antineoplastic chemotherapy: Secondary | ICD-10-CM | POA: Diagnosis not present

## 2021-12-13 DIAGNOSIS — Z7982 Long term (current) use of aspirin: Secondary | ICD-10-CM | POA: Diagnosis not present

## 2021-12-13 DIAGNOSIS — C786 Secondary malignant neoplasm of retroperitoneum and peritoneum: Secondary | ICD-10-CM | POA: Diagnosis not present

## 2021-12-13 DIAGNOSIS — I129 Hypertensive chronic kidney disease with stage 1 through stage 4 chronic kidney disease, or unspecified chronic kidney disease: Secondary | ICD-10-CM | POA: Diagnosis not present

## 2021-12-13 DIAGNOSIS — Z5112 Encounter for antineoplastic immunotherapy: Secondary | ICD-10-CM | POA: Diagnosis not present

## 2021-12-13 MED ORDER — SODIUM CHLORIDE 0.9% FLUSH
10.0000 mL | INTRAVENOUS | Status: DC | PRN
  Administered 2021-12-13: 10 mL
  Filled 2021-12-13: qty 10

## 2021-12-13 MED ORDER — PEGFILGRASTIM-JMDB 6 MG/0.6ML ~~LOC~~ SOSY
6.0000 mg | PREFILLED_SYRINGE | Freq: Once | SUBCUTANEOUS | Status: AC
  Administered 2021-12-13: 6 mg via SUBCUTANEOUS

## 2021-12-13 MED ORDER — HEPARIN SOD (PORK) LOCK FLUSH 100 UNIT/ML IV SOLN
500.0000 [IU] | Freq: Once | INTRAVENOUS | Status: AC | PRN
  Administered 2021-12-13: 500 [IU]
  Filled 2021-12-13: qty 5

## 2021-12-17 ENCOUNTER — Other Ambulatory Visit: Payer: Self-pay | Admitting: *Deleted

## 2021-12-17 DIAGNOSIS — C20 Malignant neoplasm of rectum: Secondary | ICD-10-CM

## 2021-12-19 ENCOUNTER — Ambulatory Visit
Admission: RE | Admit: 2021-12-19 | Discharge: 2021-12-19 | Disposition: A | Discharge status: Home / Self Care | Payer: Medicare HMO | Source: Ambulatory Visit | Attending: Internal Medicine | Admitting: Internal Medicine

## 2021-12-19 ENCOUNTER — Ambulatory Visit: Admission: RE | Admit: 2021-12-19 | Payer: Medicare HMO | Source: Ambulatory Visit

## 2021-12-19 ENCOUNTER — Ambulatory Visit: Payer: Medicare HMO

## 2021-12-19 DIAGNOSIS — K6289 Other specified diseases of anus and rectum: Secondary | ICD-10-CM | POA: Diagnosis not present

## 2021-12-19 DIAGNOSIS — K8020 Calculus of gallbladder without cholecystitis without obstruction: Secondary | ICD-10-CM | POA: Diagnosis not present

## 2021-12-19 DIAGNOSIS — J432 Centrilobular emphysema: Secondary | ICD-10-CM | POA: Diagnosis not present

## 2021-12-19 DIAGNOSIS — C20 Malignant neoplasm of rectum: Secondary | ICD-10-CM | POA: Insufficient documentation

## 2021-12-19 DIAGNOSIS — K6389 Other specified diseases of intestine: Secondary | ICD-10-CM | POA: Diagnosis not present

## 2021-12-19 DIAGNOSIS — K449 Diaphragmatic hernia without obstruction or gangrene: Secondary | ICD-10-CM | POA: Diagnosis not present

## 2021-12-19 DIAGNOSIS — C7800 Secondary malignant neoplasm of unspecified lung: Secondary | ICD-10-CM | POA: Diagnosis not present

## 2021-12-19 DIAGNOSIS — I771 Stricture of artery: Secondary | ICD-10-CM | POA: Diagnosis not present

## 2021-12-20 ENCOUNTER — Encounter: Payer: Self-pay | Admitting: Gastroenterology

## 2021-12-20 ENCOUNTER — Encounter
Admission: RE | Disposition: A | Discharge status: Home / Self Care | Payer: Self-pay | Source: Home / Self Care | Attending: Gastroenterology

## 2021-12-20 ENCOUNTER — Encounter: Payer: Self-pay | Admitting: Certified Registered"

## 2021-12-20 ENCOUNTER — Ambulatory Visit
Admission: RE | Admit: 2021-12-20 | Discharge: 2021-12-20 | Disposition: A | Discharge status: Home / Self Care | Payer: Medicare HMO | Attending: Gastroenterology | Admitting: Gastroenterology

## 2021-12-20 ENCOUNTER — Other Ambulatory Visit: Payer: Self-pay

## 2021-12-20 DIAGNOSIS — Z85048 Personal history of other malignant neoplasm of rectum, rectosigmoid junction, and anus: Secondary | ICD-10-CM | POA: Insufficient documentation

## 2021-12-20 DIAGNOSIS — Z800 Family history of malignant neoplasm of digestive organs: Secondary | ICD-10-CM | POA: Diagnosis not present

## 2021-12-20 DIAGNOSIS — K625 Hemorrhage of anus and rectum: Secondary | ICD-10-CM | POA: Diagnosis not present

## 2021-12-20 DIAGNOSIS — C20 Malignant neoplasm of rectum: Secondary | ICD-10-CM

## 2021-12-20 HISTORY — PX: FLEXIBLE SIGMOIDOSCOPY: SHX5431

## 2021-12-20 SURGERY — SIGMOIDOSCOPY, FLEXIBLE
Anesthesia: General

## 2021-12-20 MED ORDER — SODIUM CHLORIDE 0.9 % IV SOLN: INTRAVENOUS | Status: DC
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
## 2021-12-20 NOTE — Op Note (Signed)
[facility] Gastroenterology Patient Name: Albert Giles Procedure Date: 12/20/2021 3:04 PM MRN: [id number] Account #: [id number] Date of Birth: Mar 22, 1945 Admit Type: Outpatient Age: 76 Room: [facility] ENDO ROOM 3 Gender: Male Note Status: Finalized Instrument Name: Peds Colonoscope [id number] Procedure:             Flexible Sigmoidoscopy Indications:           Rectal hemorrhage Providers:             Lin Landsman MD, MD Referring MD:          Jesus Genera. Danise Mina (Referring MD) Medicines:             None Complications:         No immediate complications. Estimated blood loss: None. Procedure:             Pre-Anesthesia Assessment:                        - Prior to the procedure, a History and Physical was                         performed, and patient medications and allergies were                         reviewed. The patient is competent. The risks and                         benefits of the procedure and the sedation options and                         risks were discussed with the patient. All questions                         were answered and informed consent was obtained.                         Patient identification and proposed procedure were                         verified by the physician, the nurse, the                         anesthesiologist, the anesthetist and the technician                         in the procedure room in the endoscopy suite. Mental                         Status Examination: alert and oriented. Airway                         Examination: normal oropharyngeal airway and neck                         mobility. Respiratory Examination: clear to                         auscultation. CV Examination: normal. Prophylactic  Antibiotics: The patient does not require prophylactic                         antibiotics. Prior Anticoagulants: The patient has                         taken no anticoagulant or antiplatelet  agents. ASA                         Grade Assessment: III - A patient with severe systemic                         disease. After reviewing the risks and benefits, the                         patient was deemed in satisfactory condition to                         undergo the procedure. The anesthesia plan was to use                         no sedation or anesthesia. Immediately prior to                         administration of medications, the patient was                         re-assessed for adequacy to receive sedatives. The                         heart rate, respiratory rate, oxygen saturations,                         blood pressure, adequacy of pulmonary ventilation, and                         response to care were monitored throughout the                         procedure. The physical status of the patient was                         re-assessed after the procedure.                        After obtaining informed consent, the scope was passed                         under direct vision. The Colonoscope was introduced                         through the anus and advanced to the the descending                         colon. The flexible sigmoidoscopy was accomplished                         without difficulty. The patient tolerated the  procedure well. The quality of the bowel preparation                         was fair. Findings:      The perianal and digital rectal examinations were normal. Pertinent       negatives include normal sphincter tone and no palpable rectal lesions.      The entire examined colon appeared normal.      A tattoo was seen in the proximal rectum neat the site of previous       rectal cancer. The tattoo site appeared normal. There is no evidence of       tumor other than mildly erythematous rectum.      Normal retroflexion in the rectum, no evidence of hemorrhoids Impression:            - Preparation of the colon was fair.                         - The entire examined colon is normal.                        - A tattoo was seen in the proximal rectum. The tattoo                         site appeared normal.                        - No specimens collected. Recommendation:        - Discharge patient to home (with escort).                        - Resume previous diet today.                        - Continue present medications. Procedure Code(s):     --- Professional ---                        [phone], Sigmoidoscopy, flexible; diagnostic, including                         collection of specimen(s) by brushing or washing, when                         performed (separate procedure) Diagnosis Code(s):     --- Professional ---                        K62.5, Hemorrhage of anus and rectum CPT copyright 2022 [employer]. All rights reserved. The codes documented in this report are preliminary and upon coder review may  be revised to meet current compliance requirements. Dr. Ulyess Mort Lin Landsman MD, MD 12/20/2021 3:24:07 PM This report has been signed electronically. Number of Addenda: 0 Note Initiated On: 12/20/2021 3:04 PM Total Procedure Duration: 0 hours 6 minutes 25 seconds  Estimated Blood Loss:  Estimated blood loss: none. Estimated blood loss: none.      [employer]

## 2021-12-21 ENCOUNTER — Encounter: Payer: Self-pay | Admitting: Gastroenterology

## 2021-12-24 MED FILL — Dexamethasone Sodium Phosphate Inj 100 MG/10ML: INTRAMUSCULAR | Qty: 1 | Status: AC

## 2021-12-25 ENCOUNTER — Inpatient Hospital Stay: Payer: Medicare HMO | Attending: Internal Medicine | Admitting: Internal Medicine

## 2021-12-25 ENCOUNTER — Encounter: Payer: Self-pay | Admitting: Internal Medicine

## 2021-12-25 ENCOUNTER — Inpatient Hospital Stay: Payer: Medicare HMO

## 2021-12-25 VITALS — BP 127/84 | HR 68 | Temp 98.6°F | Resp 20 | Wt 158.8 lb

## 2021-12-25 DIAGNOSIS — Z5111 Encounter for antineoplastic chemotherapy: Secondary | ICD-10-CM | POA: Insufficient documentation

## 2021-12-25 DIAGNOSIS — Z5112 Encounter for antineoplastic immunotherapy: Secondary | ICD-10-CM | POA: Diagnosis not present

## 2021-12-25 DIAGNOSIS — N1832 Chronic kidney disease, stage 3b: Secondary | ICD-10-CM | POA: Diagnosis not present

## 2021-12-25 DIAGNOSIS — C20 Malignant neoplasm of rectum: Secondary | ICD-10-CM | POA: Insufficient documentation

## 2021-12-25 DIAGNOSIS — Z79899 Other long term (current) drug therapy: Secondary | ICD-10-CM | POA: Diagnosis not present

## 2021-12-25 LAB — CBC WITH DIFFERENTIAL/PLATELET
Abs Immature Granulocytes: 0.16 10*3/uL — ABNORMAL HIGH (ref 0.00–0.07)
Basophils Absolute: 0.1 10*3/uL (ref 0.0–0.1)
Basophils Relative: 1 %
Eosinophils Absolute: 0.8 10*3/uL — ABNORMAL HIGH (ref 0.0–0.5)
Eosinophils Relative: 6 %
HCT: 37.2 % — ABNORMAL LOW (ref 39.0–52.0)
Hemoglobin: 12 g/dL — ABNORMAL LOW (ref 13.0–17.0)
Immature Granulocytes: 1 %
Lymphocytes Relative: 16 %
Lymphs Abs: 1.9 10*3/uL (ref 0.7–4.0)
MCH: 33.2 pg (ref 26.0–34.0)
MCHC: 32.3 g/dL (ref 30.0–36.0)
MCV: 103 fL — ABNORMAL HIGH (ref 80.0–100.0)
Monocytes Absolute: 1 10*3/uL (ref 0.1–1.0)
Monocytes Relative: 9 %
Neutro Abs: 8.2 10*3/uL — ABNORMAL HIGH (ref 1.7–7.7)
Neutrophils Relative %: 67 %
Platelets: 124 10*3/uL — ABNORMAL LOW (ref 150–400)
RBC: 3.61 MIL/uL — ABNORMAL LOW (ref 4.22–5.81)
RDW: 17.9 % — ABNORMAL HIGH (ref 11.5–15.5)
WBC: 12.1 10*3/uL — ABNORMAL HIGH (ref 4.0–10.5)
nRBC: 0 % (ref 0.0–0.2)

## 2021-12-25 LAB — URINALYSIS, DIPSTICK ONLY
Bilirubin Urine: NEGATIVE
Glucose, UA: NEGATIVE mg/dL
Hgb urine dipstick: NEGATIVE
Ketones, ur: NEGATIVE mg/dL
Leukocytes,Ua: NEGATIVE
Nitrite: NEGATIVE
Protein, ur: 30 mg/dL — AB
Specific Gravity, Urine: 1.02 (ref 1.005–1.030)
pH: 5 (ref 5.0–8.0)

## 2021-12-25 LAB — COMPREHENSIVE METABOLIC PANEL
ALT: 42 U/L (ref 0–44)
AST: 40 U/L (ref 15–41)
Albumin: 3.2 g/dL — ABNORMAL LOW (ref 3.5–5.0)
Alkaline Phosphatase: 318 U/L — ABNORMAL HIGH (ref 38–126)
Anion gap: 9 (ref 5–15)
BUN: 15 mg/dL (ref 8–23)
CO2: 22 mmol/L (ref 22–32)
Calcium: 8.4 mg/dL — ABNORMAL LOW (ref 8.9–10.3)
Chloride: 106 mmol/L (ref 98–111)
Creatinine, Ser: 1.66 mg/dL — ABNORMAL HIGH (ref 0.61–1.24)
GFR, Estimated: 42 mL/min — ABNORMAL LOW (ref >60)
Glucose, Bld: 137 mg/dL — ABNORMAL HIGH (ref 70–99)
Potassium: 4 mmol/L (ref 3.5–5.1)
Sodium: 137 mmol/L (ref 135–145)
Total Bilirubin: 0.7 mg/dL (ref 0.3–1.2)
Total Protein: 6.1 g/dL — ABNORMAL LOW (ref 6.5–8.1)

## 2021-12-25 LAB — VITAMIN D 25 HYDROXY (VIT D DEFICIENCY, FRACTURES): Vit D, 25-Hydroxy: 47.45 ng/mL (ref 30–100)

## 2021-12-25 MED ORDER — SODIUM CHLORIDE 0.9 % IV SOLN
350.0000 mg | Freq: Once | INTRAVENOUS | Status: AC
  Administered 2021-12-25: 350 mg via INTRAVENOUS
  Filled 2021-12-25: qty 14

## 2021-12-25 MED ORDER — SODIUM CHLORIDE 0.9 % IV SOLN
150.0000 mg/m2 | Freq: Once | INTRAVENOUS | Status: AC
  Administered 2021-12-25: 300 mg via INTRAVENOUS
  Filled 2021-12-25: qty 15

## 2021-12-25 MED ORDER — SODIUM CHLORIDE 0.9 % IV SOLN
2400.0000 mg/m2 | INTRAVENOUS | Status: DC
  Administered 2021-12-25: 4650 mg via INTRAVENOUS
  Filled 2021-12-25: qty 93

## 2021-12-25 MED ORDER — SODIUM CHLORIDE 0.9% FLUSH
10.0000 mL | INTRAVENOUS | Status: DC | PRN
  Administered 2021-12-25: 10 mL
  Filled 2021-12-25: qty 10

## 2021-12-25 MED ORDER — PALONOSETRON HCL INJECTION 0.25 MG/5ML
0.2500 mg | Freq: Once | INTRAVENOUS | Status: AC
  Administered 2021-12-25: 0.25 mg via INTRAVENOUS

## 2021-12-25 MED ORDER — SODIUM CHLORIDE 0.9 % IV SOLN
10.0000 mg | Freq: Once | INTRAVENOUS | Status: AC
  Administered 2021-12-25: 10 mg via INTRAVENOUS
  Filled 2021-12-25: qty 10

## 2021-12-25 MED ORDER — SODIUM CHLORIDE 0.9 % IV SOLN
Freq: Once | INTRAVENOUS | Status: AC
  Filled 2021-12-25: qty 250

## 2021-12-25 MED ORDER — ATROPINE SULFATE 1 MG/ML IV SOLN
0.5000 mg | Freq: Once | INTRAVENOUS | Status: AC | PRN
  Administered 2021-12-25: 0.5 mg via INTRAVENOUS
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
## 2021-12-25 NOTE — Patient Instructions (Signed)
[facility] AT [place]  Discharge Instructions: Thank you for choosing [facility] to provide your oncology and hematology care.  If you have a lab appointment with the [facility], please go directly to the [facility] and check in at the registration area.  Wear comfortable clothing and clothing appropriate for easy access to any Portacath or PICC line.   We strive to give you quality time with your provider. You may need to reschedule your appointment if you arrive late (15 or more minutes).  Arriving late affects you and other patients whose appointments are after yours.  Also, if you miss three or more appointments without notifying the office, you may be dismissed from the clinic at the provider's discretion.      For prescription refill requests, have your pharmacy contact our office and allow 72 hours for refills to be completed.    Today you received the following chemotherapy and/or immunotherapy agents : FOLFIRI + Bevacizumab     To help prevent nausea and vomiting after your treatment, we encourage you to take your nausea medication as directed.  BELOW ARE SYMPTOMS THAT SHOULD BE REPORTED IMMEDIATELY: *FEVER GREATER THAN 100.4 F (38 C) OR HIGHER *CHILLS OR SWEATING *NAUSEA AND VOMITING THAT IS NOT CONTROLLED WITH YOUR NAUSEA MEDICATION *UNUSUAL SHORTNESS OF BREATH *UNUSUAL BRUISING OR BLEEDING *URINARY PROBLEMS (pain or burning when urinating, or frequent urination) *BOWEL PROBLEMS (unusual diarrhea, constipation, pain near the anus) TENDERNESS IN MOUTH AND THROAT WITH OR WITHOUT PRESENCE OF ULCERS (sore throat, sores in mouth, or a toothache) UNUSUAL RASH, SWELLING OR PAIN  UNUSUAL VAGINAL DISCHARGE OR ITCHING   Items with * indicate a potential emergency and should be followed up as soon as possible or go to the Emergency Department if any problems should occur.  Please show the CHEMOTHERAPY ALERT CARD or IMMUNOTHERAPY ALERT CARD at  check-in to the Emergency Department and triage nurse.  Should you have questions after your visit or need to cancel or reschedule your appointment, please contact [facility] [place] AT [place]  [phone] and follow the prompts.  Office hours are 8:00 a.m. to 4:30 p.m. Monday - Friday. Please note that voicemails left after 4:00 p.m. may not be returned until the following business day.  We are closed weekends and major holidays. You have access to a nurse at all times for urgent questions. Please call the main number to the clinic [phone] and follow the prompts.  For any non-urgent questions, you may also contact your provider using MyChart. We now offer e-Visits for anyone 62 and older to request care online for non-urgent symptoms. For details visit [email or web address].   Also download the MyChart app! Go to the app store, search "MyChart", open the app, select [facility], and log in with your MyChart username and password.  Masks are optional in the cancer centers. If you would like for your care team to wear a mask while they are taking care of you, please let them know. For doctor visits, patients may have with them one support person who is at least 78 years old. At this time, visitors are not allowed in the infusion area.

## 2021-12-25 NOTE — Progress Notes (Signed)
Patient has no concerns today. Patient states blood has been in his stool since October 2023.

## 2021-12-25 NOTE — Assessment & Plan Note (Addendum)
#  STAGE IV-[N-RAS MUTATED] Synchronous primaries a] rectal cancer-10 cm-adenocarcinoma & b] transverse colon-- intramucosal adenoca]; abdominal lymphadenopathy; liver metastases; omental metastases. AUG 2023- Currently on FOLFIRI+ m-Vasi.   DEC 8th, 2023- Response to therapy of pulmonary metastasis; No new or progressive disease.   #Proceed with cycle # 7 of FOLFIRI+ M-Vasi-. acceptable for treatment today.  Continue Growth factor- D-3 Fulphila.  Discussed that alkaline phosphatase LFTs  abnormal [para- 100].   #Intermittent rectal bleeding- [DEC, 2023]- flexible sigmoidoscopy with Dr. Marius Ditch.  No progressive rectal cancer noted; friable rectal mucosa thought because of patient's bleeding-okay to restart M-vasi.  But monitor closely.    # Anemia/iron deficiency hemoglobin ~12  secondary to chronic GI bleeding/tumor-slightly worse; see above. NOV Iron sat- 17; ferritin- 100.   # Thrombocytopenia/secondary chemotherapy/on aspirin-platelets- > 100 [HOLD para <80]- -monitor closely- STABLE  # HTN- 150s/94; at home BP reviewed- 110-130-/ DBP80-90s.continue checking BP at home/reviewed the log from home.  Proceed with M-vasi today. STABLE  # PN G-1-2 sec to Oxaliplatin [last 04/02/2021]. discontinued oxaliplatin for now. Poor tolerance to cymbalta 30 mg q day.STABLE   # Chronic kidney disease stage III-[GFR-38; Para >2 ] - Continue keeping up with hydration.STABLE  * CEA- not marker;  # DISPOSITION:  # FOLFIRI + M-vasi today; pump off after 48 hours/fulphila - no need to wait for UA  # per IS-  Follow up in 2 weeks- MD: labs- cbc/cmp;UA; FOLFIRI + M-vasi; ; Pump off 48 hours later; Fulphila-   Dr.B

## 2021-12-27 ENCOUNTER — Inpatient Hospital Stay: Payer: Medicare HMO

## 2021-12-27 VITALS — BP 136/75 | HR 70 | Resp 20

## 2021-12-27 DIAGNOSIS — C20 Malignant neoplasm of rectum: Secondary | ICD-10-CM | POA: Diagnosis not present

## 2021-12-27 DIAGNOSIS — Z79899 Other long term (current) drug therapy: Secondary | ICD-10-CM | POA: Diagnosis not present

## 2021-12-27 DIAGNOSIS — Z5112 Encounter for antineoplastic immunotherapy: Secondary | ICD-10-CM | POA: Diagnosis not present

## 2021-12-27 DIAGNOSIS — N1832 Chronic kidney disease, stage 3b: Secondary | ICD-10-CM | POA: Diagnosis not present

## 2021-12-27 DIAGNOSIS — Z5111 Encounter for antineoplastic chemotherapy: Secondary | ICD-10-CM | POA: Diagnosis not present

## 2021-12-27 MED ORDER — SODIUM CHLORIDE 0.9% FLUSH
10.0000 mL | INTRAVENOUS | Status: DC | PRN
  Administered 2021-12-27: 10 mL
  Filled 2021-12-27: qty 10

## 2021-12-27 MED ORDER — PEGFILGRASTIM-JMDB 6 MG/0.6ML ~~LOC~~ SOSY
6.0000 mg | PREFILLED_SYRINGE | Freq: Once | SUBCUTANEOUS | Status: AC
  Administered 2021-12-27: 6 mg via SUBCUTANEOUS
  Filled 2021-12-27: qty 0.6

## 2021-12-27 MED ORDER — HEPARIN SOD (PORK) LOCK FLUSH 100 UNIT/ML IV SOLN
500.0000 [IU] | Freq: Once | INTRAVENOUS | Status: AC | PRN
  Administered 2021-12-27: 500 [IU]
  Filled 2021-12-27: qty 5

## 2021-12-28 ENCOUNTER — Telehealth: Payer: Self-pay | Admitting: Family Medicine

## 2021-12-28 DIAGNOSIS — Z7189 Other specified counseling: Secondary | ICD-10-CM

## 2021-12-28 NOTE — Telephone Encounter (Signed)
Pt daughter came in office to drop off a copy of pt Living Will for PCP review . It was place in PCP folder

## 2021-12-28 NOTE — Telephone Encounter (Signed)
Error

## 2021-12-28 NOTE — Telephone Encounter (Signed)
Placed in Dr. G's box.  

## 2021-12-28 NOTE — Assessment & Plan Note (Signed)
Advanced directive: copy scanned 12/2021. Daughter Raj Janus is HCPOA. Does not want life prolonging measures if terminal condition. Does not want tube feeds. Wants living will followed.

## 2021-12-28 NOTE — Telephone Encounter (Signed)
Noted thank you. Chart updated.

## 2022-01-04 MED FILL — Dexamethasone Sodium Phosphate Inj 100 MG/10ML: INTRAMUSCULAR | Qty: 1 | Status: AC

## 2022-01-08 ENCOUNTER — Encounter: Payer: Self-pay | Admitting: Internal Medicine

## 2022-01-08 ENCOUNTER — Inpatient Hospital Stay: Payer: Medicare HMO

## 2022-01-08 ENCOUNTER — Inpatient Hospital Stay (HOSPITAL_BASED_OUTPATIENT_CLINIC_OR_DEPARTMENT_OTHER): Payer: Medicare HMO | Admitting: Internal Medicine

## 2022-01-08 VITALS — BP 139/86 | HR 73 | Temp 98.7°F | Resp 16 | Ht 68.0 in | Wt 159.4 lb

## 2022-01-08 VITALS — BP 113/76 | HR 67 | Resp 16

## 2022-01-08 DIAGNOSIS — N1832 Chronic kidney disease, stage 3b: Secondary | ICD-10-CM | POA: Diagnosis not present

## 2022-01-08 DIAGNOSIS — C20 Malignant neoplasm of rectum: Secondary | ICD-10-CM

## 2022-01-08 DIAGNOSIS — Z79899 Other long term (current) drug therapy: Secondary | ICD-10-CM | POA: Diagnosis not present

## 2022-01-08 DIAGNOSIS — Z5112 Encounter for antineoplastic immunotherapy: Secondary | ICD-10-CM | POA: Diagnosis not present

## 2022-01-08 DIAGNOSIS — Z5111 Encounter for antineoplastic chemotherapy: Secondary | ICD-10-CM | POA: Diagnosis not present

## 2022-01-08 LAB — CBC WITH DIFFERENTIAL/PLATELET
Abs Immature Granulocytes: 0.17 10*3/uL — ABNORMAL HIGH (ref 0.00–0.07)
Basophils Absolute: 0.1 10*3/uL (ref 0.0–0.1)
Basophils Relative: 1 %
Eosinophils Absolute: 0.2 10*3/uL (ref 0.0–0.5)
Eosinophils Relative: 2 %
HCT: 34.7 % — ABNORMAL LOW (ref 39.0–52.0)
Hemoglobin: 11.2 g/dL — ABNORMAL LOW (ref 13.0–17.0)
Immature Granulocytes: 2 %
Lymphocytes Relative: 12 %
Lymphs Abs: 1.3 10*3/uL (ref 0.7–4.0)
MCH: 33.5 pg (ref 26.0–34.0)
MCHC: 32.3 g/dL (ref 30.0–36.0)
MCV: 103.9 fL — ABNORMAL HIGH (ref 80.0–100.0)
Monocytes Absolute: 0.8 10*3/uL (ref 0.1–1.0)
Monocytes Relative: 7 %
Neutro Abs: 8.5 10*3/uL — ABNORMAL HIGH (ref 1.7–7.7)
Neutrophils Relative %: 76 %
Platelets: 121 10*3/uL — ABNORMAL LOW (ref 150–400)
RBC: 3.34 MIL/uL — ABNORMAL LOW (ref 4.22–5.81)
RDW: 18.8 % — ABNORMAL HIGH (ref 11.5–15.5)
WBC: 11.1 10*3/uL — ABNORMAL HIGH (ref 4.0–10.5)
nRBC: 0 % (ref 0.0–0.2)

## 2022-01-08 LAB — COMPREHENSIVE METABOLIC PANEL
ALT: 37 U/L (ref 0–44)
AST: 37 U/L (ref 15–41)
Albumin: 3 g/dL — ABNORMAL LOW (ref 3.5–5.0)
Alkaline Phosphatase: 305 U/L — ABNORMAL HIGH (ref 38–126)
Anion gap: 4 — ABNORMAL LOW (ref 5–15)
BUN: 13 mg/dL (ref 8–23)
CO2: 23 mmol/L (ref 22–32)
Calcium: 8.3 mg/dL — ABNORMAL LOW (ref 8.9–10.3)
Chloride: 110 mmol/L (ref 98–111)
Creatinine, Ser: 1.7 mg/dL — ABNORMAL HIGH (ref 0.61–1.24)
GFR, Estimated: 41 mL/min — ABNORMAL LOW (ref >60)
Glucose, Bld: 166 mg/dL — ABNORMAL HIGH (ref 70–99)
Potassium: 3.4 mmol/L — ABNORMAL LOW (ref 3.5–5.1)
Sodium: 137 mmol/L (ref 135–145)
Total Bilirubin: 0.6 mg/dL (ref 0.3–1.2)
Total Protein: 5.6 g/dL — ABNORMAL LOW (ref 6.5–8.1)

## 2022-01-08 LAB — URINALYSIS, DIPSTICK ONLY
Bilirubin Urine: NEGATIVE
Glucose, UA: NEGATIVE mg/dL
Hgb urine dipstick: NEGATIVE
Ketones, ur: NEGATIVE mg/dL
Leukocytes,Ua: NEGATIVE
Nitrite: NEGATIVE
Protein, ur: 30 mg/dL — AB
Specific Gravity, Urine: 1.025 (ref 1.005–1.030)
pH: 5 (ref 5.0–8.0)

## 2022-01-08 MED ORDER — HEPARIN SOD (PORK) LOCK FLUSH 100 UNIT/ML IV SOLN
500.0000 [IU] | Freq: Once | INTRAVENOUS | Status: DC
  Filled 2022-01-08: qty 5

## 2022-01-08 MED ORDER — SODIUM CHLORIDE 0.9 % IV SOLN
Freq: Once | INTRAVENOUS | Status: AC
  Filled 2022-01-08: qty 250

## 2022-01-08 MED ORDER — SODIUM CHLORIDE 0.9 % IV SOLN
350.0000 mg | Freq: Once | INTRAVENOUS | Status: AC
  Administered 2022-01-08: 350 mg via INTRAVENOUS
  Filled 2022-01-08: qty 14

## 2022-01-08 MED ORDER — PALONOSETRON HCL INJECTION 0.25 MG/5ML
0.2500 mg | Freq: Once | INTRAVENOUS | Status: AC
  Administered 2022-01-08: 0.25 mg via INTRAVENOUS
  Filled 2022-01-08: qty 5

## 2022-01-08 MED ORDER — SODIUM CHLORIDE 0.9 % IV SOLN
150.0000 mg/m2 | Freq: Once | INTRAVENOUS | Status: AC
  Administered 2022-01-08: 300 mg via INTRAVENOUS
  Filled 2022-01-08: qty 15

## 2022-01-08 MED ORDER — SODIUM CHLORIDE 0.9% FLUSH
10.0000 mL | Freq: Once | INTRAVENOUS | Status: AC
  Administered 2022-01-08: 10 mL via INTRAVENOUS
  Filled 2022-01-08: qty 10

## 2022-01-08 MED ORDER — SODIUM CHLORIDE 0.9 % IV SOLN
10.0000 mg | Freq: Once | INTRAVENOUS | Status: AC
  Administered 2022-01-08: 10 mg via INTRAVENOUS
  Filled 2022-01-08: qty 10

## 2022-01-08 MED ORDER — SODIUM CHLORIDE 0.9 % IV SOLN
2400.0000 mg/m2 | INTRAVENOUS | Status: DC
  Administered 2022-01-08: 4650 mg via INTRAVENOUS
  Filled 2022-01-08: qty 93

## 2022-01-08 MED ORDER — ATROPINE SULFATE 1 MG/ML IV SOLN
0.5000 mg | Freq: Once | INTRAVENOUS | Status: AC | PRN
  Administered 2022-01-08: 0.5 mg via INTRAVENOUS
  Filled 2022-01-08: qty 1

## 2022-01-08 NOTE — Assessment & Plan Note (Addendum)
#  STAGE IV-[N-RAS MUTATED] Synchronous primaries a] rectal cancer-10 cm-adenocarcinoma & b] transverse colon-- intramucosal adenoca]; abdominal lymphadenopathy; liver metastases; omental metastases. AUG 2023- Currently on FOLFIRI+ m-Vasi.   DEC 8th, 2023- Response to therapy of pulmonary metastasis; No new or progressive disease.   #Proceed with cycle # 8 of FOLFIRI+ M-Vasi-. acceptable for treatment today.  Continue Growth factor- D-3 Fulphila.  Discussed that alkaline phosphatase LFTs  abnormal [para- 100].   #Intermittent rectal bleeding- [DEC, 2023]- flexible sigmoidoscopy with Dr. Marius Ditch.  No progressive rectal cancer noted; friable rectal mucosa thought because of patient's bleeding-okay to restart M-vasi.  But monitor closely.  STABLE.   # Malnutrition/hypoalbuminemia-discussed regarding increased protein intake.  Will recommend evaluation with nutrition.  # Anemia/iron deficiency hemoglobin ~12  secondary to chronic GI bleeding/tumor-slightly worse; see above. NOV Iron sat- 17; ferritin- 100.   # Thrombocytopenia/secondary chemotherapy/on aspirin-platelets- > 100 [HOLD para <80]- -monitor closely- STABLE  # HTN- 150s/94; at home BP reviewed- 110-130-/ DBP80-90s.continue checking BP at home/reviewed the log from home.  Proceed with M-vasi today. STABLE   # Mild nose bleeds- ? Sec to M-vasi- monitor for now.   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 discontinued oxaliplatin for now. Poor tolerance to cymbalta 30 mg q day.STABLE   # Chronic kidney disease stage III-[GFR-41; Para >2 ] - Continue keeping up with hydration.STABLE  * CEA- not marker;  # DISPOSITION:  Refer to [place] re: malnutrition/colon cancer  # FOLFIRI + M-vasi today; pump off after 48 hours/fulphila - no need to wait for UA  # per IS-  Follow up in 2 weeks- MD: labs- cbc/cmp;UA; FOLFIRI + M-vasi; random protein: creatinine ratio; Pump off 48 hours later; Fulphila-   Dr.B
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
## 2022-01-08 NOTE — Progress Notes (Signed)
Constipation not improving with last complete bowel movement this morning.  Would like to know what is good to use for rash on buttocks that causing discomfort.

## 2022-01-10 ENCOUNTER — Inpatient Hospital Stay: Payer: Medicare HMO

## 2022-01-10 DIAGNOSIS — C20 Malignant neoplasm of rectum: Secondary | ICD-10-CM

## 2022-01-10 DIAGNOSIS — J398 Other specified diseases of upper respiratory tract: Secondary | ICD-10-CM | POA: Diagnosis not present

## 2022-01-10 DIAGNOSIS — Z79899 Other long term (current) drug therapy: Secondary | ICD-10-CM | POA: Diagnosis not present

## 2022-01-10 DIAGNOSIS — N1832 Chronic kidney disease, stage 3b: Secondary | ICD-10-CM | POA: Diagnosis not present

## 2022-01-10 DIAGNOSIS — Z5112 Encounter for antineoplastic immunotherapy: Secondary | ICD-10-CM | POA: Diagnosis not present

## 2022-01-10 DIAGNOSIS — Z5111 Encounter for antineoplastic chemotherapy: Secondary | ICD-10-CM | POA: Diagnosis not present

## 2022-01-10 MED ORDER — SODIUM CHLORIDE 0.9% FLUSH
10.0000 mL | INTRAVENOUS | Status: DC | PRN
  Administered 2022-01-10: 10 mL
  Filled 2022-01-10: qty 10

## 2022-01-10 MED ORDER — PEGFILGRASTIM-JMDB 6 MG/0.6ML ~~LOC~~ SOSY
6.0000 mg | PREFILLED_SYRINGE | Freq: Once | SUBCUTANEOUS | Status: AC
  Administered 2022-01-10: 6 mg via SUBCUTANEOUS

## 2022-01-10 MED ORDER — HEPARIN SOD (PORK) LOCK FLUSH 100 UNIT/ML IV SOLN
500.0000 [IU] | Freq: Once | INTRAVENOUS | Status: AC | PRN
  Administered 2022-01-10: 500 [IU]
  Filled 2022-01-10: qty 5

## 2022-01-11 ENCOUNTER — Inpatient Hospital Stay: Payer: Medicare HMO

## 2022-01-11 NOTE — Progress Notes (Signed)
Nutrition Follow-up:  Referred back to RD for weight loss  Last seen by RD on 11/21/20  76 year old male with colon cancer.  Patient receiving folfiri and bevacizumab.    Spoke with patient via phone.  Patient reports that his appetite has not been that good since he had COVID in Sept/Oct.  "I don't know what happened but I think it is getting better."  Ate cereal for breakfast this morning, skipped lunch (not hungry). Last night ate steak and sweet potato for dinner.  Drinks ensure shakes BID usually.  Says he has issues with diarrhea and constipation.  Likes to snack on fruit (apples and oranges). Denies nausea    Medications: reviewed  Labs: reviewed  Anthropometrics:   Weight 159 lb 6.4 oz on 12/26 161 lb on 11/28 171 lb on 9/22  11/21/20 166 lb last seen by RD   7% weight loss in the last 3 months   NUTRITION DIAGNOSIS: Inadequate oral intake related to COVID/cancer as evidenced by 7% weight loss in the last 3 months and decreased intake   INTERVENTION:  Reviewed ways to add calories and protein in diet Continue 350 calorie ensure shakes 2-3 times per day Contact information provided    MONITORING, EVALUATION, GOAL: weight trends, intake   NEXT VISIT: ~ 3-4 weeks during infusion  Ashlinn Hemrick B. Zenia Resides, [place], [place] Registered Dietitian [phone]

## 2022-01-21 ENCOUNTER — Other Ambulatory Visit: Payer: Self-pay

## 2022-01-21 ENCOUNTER — Ambulatory Visit: Payer: Medicare HMO | Admitting: Gastroenterology

## 2022-01-21 ENCOUNTER — Encounter: Payer: Self-pay | Admitting: Gastroenterology

## 2022-01-21 VITALS — BP 144/88 | HR 82 | Temp 98.0°F | Ht 68.0 in | Wt 153.0 lb

## 2022-01-21 DIAGNOSIS — R197 Diarrhea, unspecified: Secondary | ICD-10-CM

## 2022-01-21 DIAGNOSIS — K529 Noninfective gastroenteritis and colitis, unspecified: Secondary | ICD-10-CM | POA: Diagnosis not present

## 2022-01-21 MED FILL — Dexamethasone Sodium Phosphate Inj 100 MG/10ML: INTRAMUSCULAR | Qty: 1 | Status: AC

## 2022-01-21 NOTE — Progress Notes (Signed)
[name], MD [address]  [place]  [place], [place] [address]  Main: [phone]  Fax: [phone]    Gastroenterology Consultation  Referring Provider:     [name], MD Primary Care Physician:  [name], MD Primary Gastroenterologist:  Dr. [name] Reason for Consultation: Acute diarrhea        HPI:   Albert Giles is a 77 y.o. male referred by Dr. [name], MD  for consultation & management of erosive esophagitis.  Patient underwent upper endoscopy in 08/2020, found to have severe erosive esophagitis and large hiatal hernia.  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  Patient reports that his bowel movements have been variable without any visible blood.  He denies any abdominal pain, bloating, nausea or vomiting.  His weight has been stable.  With regards to erosive esophagitis, patient takes Protonix 40 mg twice daily.  No evidence of anemia, MCV elevated secondary to B12 deficiency.  Patient does not have any other GI concerns today  Follow-up visit 01/21/2022 Patient is here for follow-up of 1 week history of nonbloody diarrhea.  Patient is accompanied by his daughter today.  He reports having several bouts of nonbloody diarrhea, large-volume, watery about 8-10 times daily, nocturnal episodes associated with fecal incontinence.  He denies any abdominal pain, nausea or vomiting.  He does report intermittent bright red blood per rectum which is chronic.  He also reports fatigue and lost about 6 pounds within last 10 days.  He reports not having good appetite.  He had COVID in September 2023.  He is currently on chemotherapy for colorectal cancer.  No new medications.  He stopped taking MiraLAX about a week ago when he started having diarrhea.  Patient also reports that his blood pressure has been running relatively low at home and he  continues to take his antihypertensive medications.  His systolic is 845X to 646O and diastolic in 03O  NSAIDs: None  Antiplts/Anticoagulants/Anti thrombotics: None  GI Procedures:  Upper endoscopy 06/29/2021 - Normal duodenal bulb and second portion of the duodenum. - Erythematous mucosa in the stomach. - Normal gastroesophageal junction and esophagus. - Medium-sized hiatal hernia. - No specimens collected.  Flexible endoscopy 12/20/2021 - Preparation of the colon was fair. - The entire examined colon is normal. - A tattoo was seen in the proximal rectum. The tattoo site appeared normal. - No specimens collected.  EGD and colonoscopy 09/06/2020 - Normal duodenal bulb and second portion of the duodenum. Biopsied. - Normal stomach. Biopsied. - 4 cm hiatal hernia. - LA Grade C reflux esophagitis with no bleeding.  - Malignant partially obstructing tumor in the transverse colon and at 70 cm proximal to the anus. Biopsied. Tattooed. - One 20 mm polyp in the transverse colon, removed with mucosal resection. Resected and retrieved. Clips were placed. Tattooed. - Rule out malignancy, tumor in the mid rectum and at 10 cm proximal to the anus. Biopsied. Tattooed. - The distal rectum and anal verge are normal on retroflexion view.  DIAGNOSIS:  A. DUODENUM; COLD BIOPSY:  - DUODENAL MUCOSA WITH NO SIGNIFICANT PATHOLOGIC ALTERATION.  - NEGATIVE FOR FEATURES OF CELIAC DISEASE.  - NEGATIVE FOR DYSPLASIA AND MALIGNANCY.   B. 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  - NEGATIVE FOR DYSPLASIA AND MALIGNANCY.   C. COLON MASS, 70 CM; COLD BIOPSY:  -  INTRAMUCOSAL ADENOCARCINOMA AT LEAST; SEE COMMENT.   Comment:  There is no definitive evidence of invasion in this sample.  The  findings may not accurately represent the entire underlying lesion;  clinical correlation is recommended.   D. COLON POLYP, TRANSVERSE; HOT SNARE:   - TUBULOVILLOUS ADENOMA.  - NEGATIVE FOR HIGH GRADE DYSPLASIA AND MALIGNANCY.   E. RECTUM MASS; COLD BIOPSY:  - INVASIVE ADENOCARCINOMA, MODERATELY TO POORLY DIFFERENTIATED.   Past Medical History:  Diagnosis Date   CAD (coronary artery disease) 06/2014   UA with NSTEMI - cath with 99% prox L circ s/p stent, EF 40% ([facility], [place] [facility])   Emphysema    mild   Ex-smoker    Family history of breast cancer    Family history of colon cancer    Family history of pancreatic cancer    NSTEMI (non-ST elevated myocardial infarction) ([place]) 07/13/2014    Past Surgical History:  Procedure Laterality Date   CARDIAC CATHETERIZATION N/A 07/14/2014   Left Heart Cath and Coronary Angiography with stent placement;  Surgeon: Teodoro Spray, MD   CARDIAC CATHETERIZATION N/A 07/14/2014   Coronary Stent Intervention;  Surgeon: Yolonda Kida, MD   COLONOSCOPY WITH PROPOFOL N/A 09/06/2020   Procedure: COLONOSCOPY WITH PROPOFOL;  Surgeon: Lin Landsman, MD;  Location: [facility] ENDOSCOPY;  Service: Gastroenterology;  Laterality: N/A;   CYSTECTOMY     on back   ESOPHAGOGASTRODUODENOSCOPY N/A 09/06/2020   Procedure: ESOPHAGOGASTRODUODENOSCOPY (EGD);  Surgeon: Lin Landsman, MD;  Location: [facility] ENDOSCOPY;  Service: Gastroenterology;  Laterality: N/A;   ESOPHAGOGASTRODUODENOSCOPY N/A 06/29/2021   Procedure: ESOPHAGOGASTRODUODENOSCOPY (EGD);  Surgeon: Lin Landsman, MD;  Location: [facility] ENDOSCOPY;  Service: Gastroenterology;  Laterality: N/A;   FLEXIBLE SIGMOIDOSCOPY N/A 12/20/2021   Procedure: FLEXIBLE SIGMOIDOSCOPY;  Surgeon: Lin Landsman, MD;  Location: [facility] ENDOSCOPY;  Service: Gastroenterology;  Laterality: N/A;   INGUINAL HERNIA REPAIR Right 08/17/04   IR IMAGING GUIDED PORT INSERTION  09/13/2020   Current Outpatient Medications:    aspirin EC 81 MG tablet, Take 1 tablet (81 mg total) by mouth daily., Disp: 60 tablet, Rfl: 1   atorvastatin (LIPITOR) 40 MG tablet, Take 40 mg by mouth  daily., Disp: , Rfl:    Cholecalciferol (VITAMIN D3) 25 MCG (1000 UT) CAPS, Take 1 capsule (1,000 Units total) by mouth daily., Disp: 30 capsule, Rfl:    Cyanocobalamin (B-12) 1000 MCG SUBL, Place 1 tablet under the tongue daily., Disp: , Rfl:    docusate sodium (COLACE) 100 MG capsule, Take 100 mg by mouth 2 (two) times daily as needed., Disp: , Rfl:    ferrous sulfate 324 (65 Fe) MG TBEC, Take 1 tablet (325 mg total) by mouth every other day., Disp: , Rfl:    metoprolol tartrate (LOPRESSOR) 25 MG tablet, Take 1 tablet (25 mg total) by mouth 2 (two) times daily., Disp: 60 tablet, Rfl: 1   pantoprazole (PROTONIX) 40 MG tablet, TAKE 1 TABLET BY MOUTH TWICE DAILY BEFORE A MEAL, Disp: 60 tablet, Rfl: 11   polyethylene glycol (MIRALAX / GLYCOLAX) 17 g packet, Take 17 g by mouth daily., Disp: , Rfl:    triamcinolone ointment (KENALOG) 0.5 %, Apply 1 Application topically 2 (two) times daily., Disp: 30 g, Rfl: 0 No current facility-administered medications for this visit.  Facility-Administered Medications Ordered in Other Visits:    sodium chloride flush (NS) 0.9 % injection 10 mL, 10 mL, Intravenous, PRN, Cammie Sickle, MD, 10 mL at 10/09/20 0855    Family  History  Problem Relation Age of Onset   Cancer Mother 30       colon   Cancer Sister        breast   Crohn's disease Sister    Cancer Daughter        anal   Crohn's disease Niece    CAD Neg Hx    Stroke Neg Hx    Diabetes Neg Hx    Prostate cancer Neg Hx    Kidney cancer Neg Hx    Bladder Cancer Neg Hx      Social History   Tobacco Use   Smoking status: Former    Packs/day: 1.00    Years: 35.00    Total pack years: 35.00    Types: Cigarettes    Quit date: 07/07/1998    Years since quitting: 23.5   Smokeless tobacco: Never  Vaping Use   Vaping Use: Never used  Substance Use Topics   Alcohol use: No   Drug use: No    Allergies as of 01/21/2022   (No Known Allergies)    Review of Systems:    All systems  reviewed and negative except where noted in HPI.   Physical Exam:  BP (!) 144/88 (BP Location: Right Arm, Patient Position: Sitting, Cuff Size: Normal)   Pulse 82   Temp 98 F (36.7 C) (Oral)   Ht '5\' 8"'$  (1.727 m)   Wt 153 lb (69.4 kg)   BMI 23.26 kg/m  No LMP for male patient.  General:   Alert,  Well-developed, well-nourished, pleasant and cooperative in NAD Head:  Normocephalic and atraumatic. Eyes:  Sclera clear, no icterus.   Conjunctiva pink. Ears:  Normal auditory acuity. Nose:  No deformity, discharge, or lesions. Mouth:  No deformity or lesions,oropharynx pink & moist. Neck:  Supple; no masses or thyromegaly. Lungs:  Respirations even and unlabored.  Clear throughout to auscultation.   No wheezes, crackles, or rhonchi. No acute distress. Heart:  Regular rate and rhythm; no murmurs, clicks, rubs, or gallops. Abdomen:  Normal bowel sounds. Soft, non-tender and non-distended without masses, hepatosplenomegaly or hernias noted.  No guarding or rebound tenderness.   Rectal: Not performed Msk:  Symmetrical without gross deformities. Good, equal movement & strength bilaterally. Pulses:  Normal pulses noted. Extremities:  No clubbing or edema.  No cyanosis. Neurologic:  Alert and oriented x3;  grossly normal neurologically. Skin:  Intact without significant lesions or rashes. No jaundice. Psych:  Alert and cooperative. Normal mood and affect.  Imaging Studies: Reviewed  Assessment and Plan:   PETRO TALENT is a 77 y.o. male with history of erosive esophagitis s/p confirmed healing, stage IV synchronous rectal and transverse colon adenocarcinoma on chemotherapy is seen in consultation for 1 week history of acute diarrhea with intermittent bright red blood per rectum and 6 pounds weight loss  Recommend GI profile PCR to rule out any infection Check CBC, BMP, magnesium and phosphorus Advised patient to cut back on taking blood pressure medication and notify his PCP in setting of  acute diarrhea and dehydration Trial of Imodium after he drops off the stool specimen  Erosive esophagitis and 4 cm hiatal hernia Repeat EGD showed confirmed healing and no evidence of underlying Barrett's esophagus Continue Protonix 40 mg p.o. twice daily indefinitely due to presence of hiatal hernia Continue antireflux lifestyle  Follow up based on the above workup   [name], MD

## 2022-01-22 ENCOUNTER — Ambulatory Visit: Payer: Medicare HMO | Admitting: Gastroenterology

## 2022-01-22 ENCOUNTER — Inpatient Hospital Stay: Payer: Medicare HMO | Attending: Internal Medicine | Admitting: Internal Medicine

## 2022-01-22 ENCOUNTER — Inpatient Hospital Stay: Payer: Medicare HMO

## 2022-01-22 ENCOUNTER — Encounter: Payer: Self-pay | Admitting: Internal Medicine

## 2022-01-22 VITALS — BP 119/81 | HR 83 | Temp 96.5°F | Resp 19 | Wt 154.8 lb

## 2022-01-22 DIAGNOSIS — C7800 Secondary malignant neoplasm of unspecified lung: Secondary | ICD-10-CM | POA: Diagnosis not present

## 2022-01-22 DIAGNOSIS — E8809 Other disorders of plasma-protein metabolism, not elsewhere classified: Secondary | ICD-10-CM | POA: Diagnosis not present

## 2022-01-22 DIAGNOSIS — R112 Nausea with vomiting, unspecified: Secondary | ICD-10-CM | POA: Insufficient documentation

## 2022-01-22 DIAGNOSIS — R197 Diarrhea, unspecified: Secondary | ICD-10-CM | POA: Diagnosis not present

## 2022-01-22 DIAGNOSIS — C786 Secondary malignant neoplasm of retroperitoneum and peritoneum: Secondary | ICD-10-CM | POA: Diagnosis not present

## 2022-01-22 DIAGNOSIS — Z79899 Other long term (current) drug therapy: Secondary | ICD-10-CM | POA: Insufficient documentation

## 2022-01-22 DIAGNOSIS — C787 Secondary malignant neoplasm of liver and intrahepatic bile duct: Secondary | ICD-10-CM | POA: Diagnosis not present

## 2022-01-22 DIAGNOSIS — C20 Malignant neoplasm of rectum: Secondary | ICD-10-CM

## 2022-01-22 DIAGNOSIS — N183 Chronic kidney disease, stage 3 unspecified: Secondary | ICD-10-CM | POA: Diagnosis not present

## 2022-01-22 DIAGNOSIS — E46 Unspecified protein-calorie malnutrition: Secondary | ICD-10-CM | POA: Diagnosis not present

## 2022-01-22 DIAGNOSIS — I129 Hypertensive chronic kidney disease with stage 1 through stage 4 chronic kidney disease, or unspecified chronic kidney disease: Secondary | ICD-10-CM | POA: Insufficient documentation

## 2022-01-22 DIAGNOSIS — K922 Gastrointestinal hemorrhage, unspecified: Secondary | ICD-10-CM | POA: Diagnosis not present

## 2022-01-22 DIAGNOSIS — K529 Noninfective gastroenteritis and colitis, unspecified: Secondary | ICD-10-CM | POA: Diagnosis not present

## 2022-01-22 DIAGNOSIS — D509 Iron deficiency anemia, unspecified: Secondary | ICD-10-CM | POA: Insufficient documentation

## 2022-01-22 LAB — CBC WITH DIFFERENTIAL/PLATELET
Abs Immature Granulocytes: 0.18 10*3/uL — ABNORMAL HIGH (ref 0.00–0.07)
Basophils Absolute: 0.2 10*3/uL — ABNORMAL HIGH (ref 0.0–0.1)
Basophils Relative: 1 %
Eosinophils Absolute: 0.3 10*3/uL (ref 0.0–0.5)
Eosinophils Relative: 2 %
HCT: 36.3 % — ABNORMAL LOW (ref 39.0–52.0)
Hemoglobin: 11.8 g/dL — ABNORMAL LOW (ref 13.0–17.0)
Immature Granulocytes: 1 %
Lymphocytes Relative: 13 %
Lymphs Abs: 1.7 10*3/uL (ref 0.7–4.0)
MCH: 33.8 pg (ref 26.0–34.0)
MCHC: 32.5 g/dL (ref 30.0–36.0)
MCV: 104 fL — ABNORMAL HIGH (ref 80.0–100.0)
Monocytes Absolute: 1.2 10*3/uL — ABNORMAL HIGH (ref 0.1–1.0)
Monocytes Relative: 9 %
Neutro Abs: 9.9 10*3/uL — ABNORMAL HIGH (ref 1.7–7.7)
Neutrophils Relative %: 74 %
Platelets: 151 10*3/uL (ref 150–400)
RBC: 3.49 MIL/uL — ABNORMAL LOW (ref 4.22–5.81)
RDW: 19.4 % — ABNORMAL HIGH (ref 11.5–15.5)
Smear Review: NORMAL
WBC: 13.4 10*3/uL — ABNORMAL HIGH (ref 4.0–10.5)
nRBC: 0 % (ref 0.0–0.2)

## 2022-01-22 LAB — COMPREHENSIVE METABOLIC PANEL
ALT: 36 U/L (ref 0–44)
AST: 43 U/L — ABNORMAL HIGH (ref 15–41)
Albumin: 3.2 g/dL — ABNORMAL LOW (ref 3.5–5.0)
Alkaline Phosphatase: 335 U/L — ABNORMAL HIGH (ref 38–126)
Anion gap: 8 (ref 5–15)
BUN: 17 mg/dL (ref 8–23)
CO2: 23 mmol/L (ref 22–32)
Calcium: 8.3 mg/dL — ABNORMAL LOW (ref 8.9–10.3)
Chloride: 107 mmol/L (ref 98–111)
Creatinine, Ser: 1.87 mg/dL — ABNORMAL HIGH (ref 0.61–1.24)
GFR, Estimated: 36 mL/min — ABNORMAL LOW (ref >60)
Glucose, Bld: 165 mg/dL — ABNORMAL HIGH (ref 70–99)
Potassium: 3.7 mmol/L (ref 3.5–5.1)
Sodium: 138 mmol/L (ref 135–145)
Total Bilirubin: 0.8 mg/dL (ref 0.3–1.2)
Total Protein: 5.6 g/dL — ABNORMAL LOW (ref 6.5–8.1)

## 2022-01-22 MED ORDER — DIPHENOXYLATE-ATROPINE 2.5-0.025 MG PO TABS: 1.0000 | ORAL_TABLET | Freq: Four times a day (QID) | ORAL | 1 refills | Status: DC | PRN

## 2022-01-22 MED ORDER — SODIUM CHLORIDE 0.9 % IV SOLN
350.0000 mg | Freq: Once | INTRAVENOUS | Status: AC
  Administered 2022-01-22: 350 mg via INTRAVENOUS
  Filled 2022-01-22: qty 14

## 2022-01-22 MED ORDER — SODIUM CHLORIDE 0.9 % IV SOLN
10.0000 mg | Freq: Once | INTRAVENOUS | Status: AC
  Administered 2022-01-22: 10 mg via INTRAVENOUS
  Filled 2022-01-22: qty 10

## 2022-01-22 MED ORDER — SODIUM CHLORIDE 0.9 % IV SOLN
Freq: Once | INTRAVENOUS | Status: AC
  Filled 2022-01-22: qty 250

## 2022-01-22 MED ORDER — PALONOSETRON HCL INJECTION 0.25 MG/5ML
0.2500 mg | Freq: Once | INTRAVENOUS | Status: AC
  Administered 2022-01-22: 0.25 mg via INTRAVENOUS
  Filled 2022-01-22: qty 5

## 2022-01-22 MED ORDER — SODIUM CHLORIDE 0.9 % IV SOLN
2400.0000 mg/m2 | INTRAVENOUS | Status: DC
  Administered 2022-01-22: 4650 mg via INTRAVENOUS
  Filled 2022-01-22: qty 93

## 2022-01-22 MED ORDER — ATROPINE SULFATE 1 MG/ML IV SOLN
0.5000 mg | Freq: Once | INTRAVENOUS | Status: AC
  Administered 2022-01-22: 0.5 mg via INTRAVENOUS
  Filled 2022-01-22: qty 1

## 2022-01-22 MED ORDER — SODIUM CHLORIDE 0.9 % IV SOLN
150.0000 mg/m2 | Freq: Once | INTRAVENOUS | Status: AC
  Administered 2022-01-22: 300 mg via INTRAVENOUS
  Filled 2022-01-22: qty 15
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
## 2022-01-22 NOTE — Assessment & Plan Note (Signed)
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 AUG 2023- Currently on FOLFIRI+ m-Vasi.   DEC 8th, 2023- Response to therapy of pulmonary metastasis; No new or progressive disease.   #Proceed with cycle # 9  of FOLFIRI+ M-Vasi-. acceptable for treatment today.  Continue Growth factor- D-3 Fulphila.  Discussed that alkaline phosphatase LFTs  abnormal [para- 100]. Will repeat CT scan in March 2024. ADD atropine pre-meds- see below.   # Diarrhea/constipation- ? Sec to chemo-Irinotecan/ laxatives- awaiting stool studies with GI. Continue lomotil prn.   #Intermittent rectal bleeding- [DEC, 2023]- flexible sigmoidoscopy with Dr. Marius Ditch.  No progressive rectal cancer noted; friable rectal mucosa thought because of patient's bleeding-okay to restart M-vasi.  But monitor closely.  STABLE.   # Epistaxis- G-1-sec to Bev/ dry weather- monitor for now. STABLE   # Malnutrition/hypoalbuminemia-discussed regarding increased protein intake.  S/p evaluation with nutrition [JAN 2024]  # Anemia/iron deficiency hemoglobin ~12  secondary to chronic GI bleeding/tumor-slightly worse; see above. NOV Iron sat- 17; ferritin- 100.   # Thrombocytopenia/secondary chemotherapy/on aspirin-platelets- > 100 [HOLD para <80]- -monitor closely- STABLE  # HTN- 150s/94; at home BP reviewed- 110-130-/ DBP80-90s.continue checking BP at home/reviewed the log from home.  Proceed with M-vasi today. STABLE   # PN G-1-2 sec to Oxaliplatin [last 04/02/2021]. discontinued oxaliplatin for now. Poor tolerance to cymbalta 30 mg q day.STABLE   # Chronic kidney disease stage III-[GFR-41; Para >2 ] - Continue keeping up with hydration.STABLE   * CEA- not marker;  # DISPOSITION:  # FOLFIRI + M-vasi today; pump off after 48 hours/fulphila - no need to wait for UA  # per IS-  Follow up in 2 weeks-  MD: labs- cbc/cmp;UA; FOLFIRI + M-vasi; random protein: creatinine ratio; Pump off 48 hours later; Fulphila-   Dr.B

## 2022-01-22 NOTE — Progress Notes (Unsigned)
Patient has no concerns 

## 2022-01-22 NOTE — Patient Instructions (Signed)
[facility] CANCER CTR AT [place]  Discharge Instructions: Thank you for choosing [facility] to provide your oncology and hematology care.  If you have a lab appointment with the [facility], please go directly to the [facility] and check in at the registration area.  Wear comfortable clothing and clothing appropriate for easy access to any Portacath or PICC line.   We strive to give you quality time with your provider. You may need to reschedule your appointment if you arrive late (15 or more minutes).  Arriving late affects you and other patients whose appointments are after yours.  Also, if you miss three or more appointments without notifying the office, you may be dismissed from the clinic at the provider's discretion.      For prescription refill requests, have your pharmacy contact our office and allow 72 hours for refills to be completed.    Today you received the following chemotherapy and/or immunotherapy agents MVASI, Camptosar, Adrucil      To help prevent nausea and vomiting after your treatment, we encourage you to take your nausea medication as directed.  BELOW ARE SYMPTOMS THAT SHOULD BE REPORTED IMMEDIATELY: *FEVER GREATER THAN 100.4 F (38 C) OR HIGHER *CHILLS OR SWEATING *NAUSEA AND VOMITING THAT IS NOT CONTROLLED WITH YOUR NAUSEA MEDICATION *UNUSUAL SHORTNESS OF BREATH *UNUSUAL BRUISING OR BLEEDING *URINARY PROBLEMS (pain or burning when urinating, or frequent urination) *BOWEL PROBLEMS (unusual diarrhea, constipation, pain near the anus) TENDERNESS IN MOUTH AND THROAT WITH OR WITHOUT PRESENCE OF ULCERS (sore throat, sores in mouth, or a toothache) UNUSUAL RASH, SWELLING OR PAIN  UNUSUAL VAGINAL DISCHARGE OR ITCHING   Items with * indicate a potential emergency and should be followed up as soon as possible or go to the Emergency Department if any problems should occur.  Please show the CHEMOTHERAPY ALERT CARD or IMMUNOTHERAPY ALERT CARD  at check-in to the Emergency Department and triage nurse.  Should you have questions after your visit or need to cancel or reschedule your appointment, please contact [facility] CANCER [place] AT [place]  [phone] and follow the prompts.  Office hours are 8:00 a.m. to 4:30 p.m. Monday - Friday. Please note that voicemails left after 4:00 p.m. may not be returned until the following business day.  We are closed weekends and major holidays. You have access to a nurse at all times for urgent questions. Please call the main number to the clinic [phone] and follow the prompts.  For any non-urgent questions, you may also contact your provider using MyChart. We now offer e-Visits for anyone 25 and older to request care online for non-urgent symptoms. For details visit [email or web address].   Also download the MyChart app! Go to the app store, search "MyChart", open the app, select [facility], and log in with your MyChart username and password.

## 2022-01-24 ENCOUNTER — Encounter: Payer: Self-pay | Admitting: Internal Medicine

## 2022-01-24 ENCOUNTER — Inpatient Hospital Stay: Payer: Medicare HMO

## 2022-01-24 VITALS — BP 158/86 | HR 86 | Resp 18

## 2022-01-24 DIAGNOSIS — R197 Diarrhea, unspecified: Secondary | ICD-10-CM | POA: Diagnosis not present

## 2022-01-24 DIAGNOSIS — E46 Unspecified protein-calorie malnutrition: Secondary | ICD-10-CM | POA: Diagnosis not present

## 2022-01-24 DIAGNOSIS — C20 Malignant neoplasm of rectum: Secondary | ICD-10-CM | POA: Diagnosis not present

## 2022-01-24 DIAGNOSIS — C7800 Secondary malignant neoplasm of unspecified lung: Secondary | ICD-10-CM | POA: Diagnosis not present

## 2022-01-24 DIAGNOSIS — N183 Chronic kidney disease, stage 3 unspecified: Secondary | ICD-10-CM | POA: Diagnosis not present

## 2022-01-24 DIAGNOSIS — C786 Secondary malignant neoplasm of retroperitoneum and peritoneum: Secondary | ICD-10-CM | POA: Diagnosis not present

## 2022-01-24 DIAGNOSIS — Z79899 Other long term (current) drug therapy: Secondary | ICD-10-CM | POA: Diagnosis not present

## 2022-01-24 DIAGNOSIS — E8809 Other disorders of plasma-protein metabolism, not elsewhere classified: Secondary | ICD-10-CM | POA: Diagnosis not present

## 2022-01-24 DIAGNOSIS — K922 Gastrointestinal hemorrhage, unspecified: Secondary | ICD-10-CM | POA: Diagnosis not present

## 2022-01-24 DIAGNOSIS — I129 Hypertensive chronic kidney disease with stage 1 through stage 4 chronic kidney disease, or unspecified chronic kidney disease: Secondary | ICD-10-CM | POA: Diagnosis not present

## 2022-01-24 DIAGNOSIS — C787 Secondary malignant neoplasm of liver and intrahepatic bile duct: Secondary | ICD-10-CM | POA: Diagnosis not present

## 2022-01-24 DIAGNOSIS — D509 Iron deficiency anemia, unspecified: Secondary | ICD-10-CM | POA: Diagnosis not present

## 2022-01-24 DIAGNOSIS — R112 Nausea with vomiting, unspecified: Secondary | ICD-10-CM | POA: Diagnosis not present

## 2022-01-24 LAB — CBC
Hematocrit: 34.7 % — ABNORMAL LOW (ref 37.5–51.0)
Hemoglobin: 11.5 g/dL — ABNORMAL LOW (ref 13.0–17.7)
MCH: 33.3 pg — ABNORMAL HIGH (ref 26.6–33.0)
MCHC: 33.1 g/dL (ref 31.5–35.7)
MCV: 101 fL — ABNORMAL HIGH (ref 79–97)
Platelets: 113 10*3/uL — ABNORMAL LOW (ref 150–450)
RBC: 3.45 x10E6/uL — ABNORMAL LOW (ref 4.14–5.80)
RDW: 16.4 % — ABNORMAL HIGH (ref 11.6–15.4)
WBC: 10.7 10*3/uL (ref 3.4–10.8)

## 2022-01-24 LAB — BASIC METABOLIC PANEL
BUN/Creatinine Ratio: 10 (ref 10–24)
BUN: 16 mg/dL (ref 8–27)
CO2: 20 mmol/L (ref 20–29)
Calcium: 8.8 mg/dL (ref 8.6–10.2)
Chloride: 105 mmol/L (ref 96–106)
Creatinine, Ser: 1.66 mg/dL — ABNORMAL HIGH (ref 0.76–1.27)
Glucose: 143 mg/dL — ABNORMAL HIGH (ref 70–99)
Potassium: 4.2 mmol/L (ref 3.5–5.2)
Sodium: 141 mmol/L (ref 134–144)
eGFR: 42 mL/min/{1.73_m2} — ABNORMAL LOW (ref >59)

## 2022-01-24 LAB — PHOSPHORUS: Phosphorus: 3.2 mg/dL (ref 2.8–4.1)

## 2022-01-24 LAB — MAGNESIUM: Magnesium: 1.8 mg/dL (ref 1.6–2.3)

## 2022-01-24 MED ORDER — HEPARIN SOD (PORK) LOCK FLUSH 100 UNIT/ML IV SOLN
500.0000 [IU] | Freq: Once | INTRAVENOUS | Status: AC | PRN
  Administered 2022-01-24: 500 [IU]
  Filled 2022-01-24: qty 5

## 2022-01-24 MED ORDER — PEGFILGRASTIM-JMDB 6 MG/0.6ML ~~LOC~~ SOSY
6.0000 mg | PREFILLED_SYRINGE | Freq: Once | SUBCUTANEOUS | Status: AC
  Administered 2022-01-24: 6 mg via SUBCUTANEOUS

## 2022-01-24 MED ORDER — SODIUM CHLORIDE 0.9% FLUSH
10.0000 mL | INTRAVENOUS | Status: DC | PRN
  Administered 2022-01-24: 10 mL
  Filled 2022-01-24: qty 10

## 2022-01-25 ENCOUNTER — Telehealth: Payer: Self-pay

## 2022-01-25 NOTE — Telephone Encounter (Signed)
-----  Message from Lin Landsman, MD sent at 01/24/2022  4:49 PM EST ----- Please inform patient that his labs look stable.  Will follow-up on the stool study results to rule out any infection  RV

## 2022-01-25 NOTE — Telephone Encounter (Signed)
Tried to call patient but voicemail is not set up. Sent mychart message

## 2022-01-26 LAB — GI PROFILE, STOOL, PCR

## 2022-01-30 ENCOUNTER — Encounter: Payer: Self-pay | Admitting: Internal Medicine

## 2022-01-31 ENCOUNTER — Telehealth: Payer: Self-pay

## 2022-01-31 ENCOUNTER — Telehealth: Payer: Self-pay | Admitting: *Deleted

## 2022-01-31 DIAGNOSIS — R197 Diarrhea, unspecified: Secondary | ICD-10-CM

## 2022-01-31 DIAGNOSIS — C20 Malignant neoplasm of rectum: Secondary | ICD-10-CM

## 2022-01-31 DIAGNOSIS — Z95828 Presence of other vascular implants and grafts: Secondary | ICD-10-CM

## 2022-01-31 NOTE — Telephone Encounter (Signed)
Tried to call patient but voicemail is not set up  

## 2022-01-31 NOTE — Telephone Encounter (Signed)
-----  Message from Lin Landsman, MD sent at 01/31/2022 12:23 PM EST ----- Stool studies came back negative for infection.  Please check with patient if he is still having diarrhea and ongoing weight loss  Thanks  RV

## 2022-01-31 NOTE — Telephone Encounter (Signed)
Patient daughter called reportng that patient is not doing well, he is not eating, still has diarrhea even though he is taking the medicine as per Dr B, He is sleeping all the time and does not want to get out of bed. She is asking what she can do for him. Please advise

## 2022-01-31 NOTE — Telephone Encounter (Signed)
Contacted patient has chronic diarrhea - ongoing symptoms x 3 months. Patient not always consistent with taking his lomotil. Patient doesn't want to drink/eat because of concern that he may have diarrhea. He was evaluate - GI panel work up- with the GI dept - which was negative for any infectious process.  Patient may need follow-up with Joli due to decrease appetite. Per daughter, Albert Giles has provided all the nutritional advice, but pt often declines and will not follow through with nutritions recommendations.  I will put pt on schedule for port lab/ smc (sarah) / smc fluids for tommorrow

## 2022-01-31 NOTE — Telephone Encounter (Signed)
Patient verbalized understanding of results  

## 2022-02-01 ENCOUNTER — Inpatient Hospital Stay: Payer: Medicare HMO

## 2022-02-01 ENCOUNTER — Encounter: Payer: Self-pay | Admitting: Internal Medicine

## 2022-02-01 ENCOUNTER — Encounter: Payer: Self-pay | Admitting: Medical Oncology

## 2022-02-01 ENCOUNTER — Other Ambulatory Visit: Payer: Self-pay

## 2022-02-01 ENCOUNTER — Other Ambulatory Visit: Payer: Self-pay | Admitting: *Deleted

## 2022-02-01 ENCOUNTER — Ambulatory Visit: Payer: Medicare HMO | Admitting: Family Medicine

## 2022-02-01 ENCOUNTER — Inpatient Hospital Stay (HOSPITAL_BASED_OUTPATIENT_CLINIC_OR_DEPARTMENT_OTHER): Payer: Medicare HMO | Admitting: Medical Oncology

## 2022-02-01 VITALS — BP 113/73 | HR 91 | Temp 97.8°F | Resp 18 | Ht 68.0 in | Wt 147.9 lb

## 2022-02-01 DIAGNOSIS — K922 Gastrointestinal hemorrhage, unspecified: Secondary | ICD-10-CM | POA: Diagnosis not present

## 2022-02-01 DIAGNOSIS — C20 Malignant neoplasm of rectum: Secondary | ICD-10-CM

## 2022-02-01 DIAGNOSIS — R112 Nausea with vomiting, unspecified: Secondary | ICD-10-CM | POA: Diagnosis not present

## 2022-02-01 DIAGNOSIS — Z95828 Presence of other vascular implants and grafts: Secondary | ICD-10-CM

## 2022-02-01 DIAGNOSIS — I129 Hypertensive chronic kidney disease with stage 1 through stage 4 chronic kidney disease, or unspecified chronic kidney disease: Secondary | ICD-10-CM | POA: Diagnosis not present

## 2022-02-01 DIAGNOSIS — T451X5A Adverse effect of antineoplastic and immunosuppressive drugs, initial encounter: Secondary | ICD-10-CM

## 2022-02-01 DIAGNOSIS — E46 Unspecified protein-calorie malnutrition: Secondary | ICD-10-CM | POA: Diagnosis not present

## 2022-02-01 DIAGNOSIS — R197 Diarrhea, unspecified: Secondary | ICD-10-CM

## 2022-02-01 DIAGNOSIS — D509 Iron deficiency anemia, unspecified: Secondary | ICD-10-CM | POA: Diagnosis not present

## 2022-02-01 DIAGNOSIS — C7800 Secondary malignant neoplasm of unspecified lung: Secondary | ICD-10-CM | POA: Diagnosis not present

## 2022-02-01 DIAGNOSIS — N183 Chronic kidney disease, stage 3 unspecified: Secondary | ICD-10-CM | POA: Diagnosis not present

## 2022-02-01 DIAGNOSIS — D701 Agranulocytosis secondary to cancer chemotherapy: Secondary | ICD-10-CM

## 2022-02-01 DIAGNOSIS — C787 Secondary malignant neoplasm of liver and intrahepatic bile duct: Secondary | ICD-10-CM | POA: Diagnosis not present

## 2022-02-01 DIAGNOSIS — E8809 Other disorders of plasma-protein metabolism, not elsewhere classified: Secondary | ICD-10-CM | POA: Diagnosis not present

## 2022-02-01 DIAGNOSIS — D6959 Other secondary thrombocytopenia: Secondary | ICD-10-CM

## 2022-02-01 DIAGNOSIS — Z79899 Other long term (current) drug therapy: Secondary | ICD-10-CM | POA: Diagnosis not present

## 2022-02-01 DIAGNOSIS — E860 Dehydration: Secondary | ICD-10-CM

## 2022-02-01 DIAGNOSIS — C786 Secondary malignant neoplasm of retroperitoneum and peritoneum: Secondary | ICD-10-CM | POA: Diagnosis not present

## 2022-02-01 LAB — CBC WITH DIFFERENTIAL/PLATELET
Abs Immature Granulocytes: 0.19 10*3/uL — ABNORMAL HIGH (ref 0.00–0.07)
Basophils Absolute: 0.1 10*3/uL (ref 0.0–0.1)
Basophils Relative: 2 %
Eosinophils Absolute: 0 10*3/uL (ref 0.0–0.5)
Eosinophils Relative: 2 %
HCT: 34.7 % — ABNORMAL LOW (ref 39.0–52.0)
Hemoglobin: 11.1 g/dL — ABNORMAL LOW (ref 13.0–17.0)
Immature Granulocytes: 8 %
Lymphocytes Relative: 42 %
Lymphs Abs: 1 10*3/uL (ref 0.7–4.0)
MCH: 32.9 pg (ref 26.0–34.0)
MCHC: 32 g/dL (ref 30.0–36.0)
MCV: 103 fL — ABNORMAL HIGH (ref 80.0–100.0)
Monocytes Absolute: 0.4 10*3/uL (ref 0.1–1.0)
Monocytes Relative: 18 %
Neutro Abs: 0.7 10*3/uL — ABNORMAL LOW (ref 1.7–7.7)
Neutrophils Relative %: 28 %
Platelets: 50 10*3/uL — ABNORMAL LOW (ref 150–400)
RBC: 3.37 MIL/uL — ABNORMAL LOW (ref 4.22–5.81)
RDW: 17.9 % — ABNORMAL HIGH (ref 11.5–15.5)
Smear Review: NORMAL
WBC: 2.4 10*3/uL — ABNORMAL LOW (ref 4.0–10.5)
nRBC: 0 % (ref 0.0–0.2)

## 2022-02-01 LAB — COMPREHENSIVE METABOLIC PANEL
ALT: 29 U/L (ref 0–44)
AST: 36 U/L (ref 15–41)
Albumin: 2.9 g/dL — ABNORMAL LOW (ref 3.5–5.0)
Alkaline Phosphatase: 240 U/L — ABNORMAL HIGH (ref 38–126)
Anion gap: 10 (ref 5–15)
BUN: 23 mg/dL (ref 8–23)
CO2: 22 mmol/L (ref 22–32)
Calcium: 8.1 mg/dL — ABNORMAL LOW (ref 8.9–10.3)
Chloride: 104 mmol/L (ref 98–111)
Creatinine, Ser: 1.53 mg/dL — ABNORMAL HIGH (ref 0.61–1.24)
GFR, Estimated: 46 mL/min — ABNORMAL LOW (ref >60)
Glucose, Bld: 119 mg/dL — ABNORMAL HIGH (ref 70–99)
Potassium: 4.3 mmol/L (ref 3.5–5.1)
Sodium: 136 mmol/L (ref 135–145)
Total Bilirubin: 1.9 mg/dL — ABNORMAL HIGH (ref 0.3–1.2)
Total Protein: 5.7 g/dL — ABNORMAL LOW (ref 6.5–8.1)

## 2022-02-01 LAB — MAGNESIUM: Magnesium: 1.7 mg/dL (ref 1.7–2.4)

## 2022-02-01 MED ORDER — LEVOFLOXACIN 750 MG PO TABS: 750.0000 mg | ORAL_TABLET | Freq: Every day | ORAL | 0 refills | Status: AC

## 2022-02-01 MED ORDER — SODIUM CHLORIDE 0.9 % IV SOLN
INTRAVENOUS | Status: DC
  Filled 2022-02-01 (×2): qty 250

## 2022-02-01 MED ORDER — HEPARIN SOD (PORK) LOCK FLUSH 100 UNIT/ML IV SOLN
500.0000 [IU] | Freq: Once | INTRAVENOUS | Status: AC
  Administered 2022-02-01: 500 [IU] via INTRAVENOUS
  Filled 2022-02-01: qty 5

## 2022-02-01 MED ORDER — NYSTATIN 100000 UNIT/ML MT SUSP: 5.0000 mL | Freq: Four times a day (QID) | OROMUCOSAL | 1 refills | Status: DC | PRN

## 2022-02-01 MED ORDER — SODIUM CHLORIDE 0.9% FLUSH
10.0000 mL | Freq: Once | INTRAVENOUS | Status: AC
  Administered 2022-02-01: 10 mL via INTRAVENOUS
  Filled 2022-02-01: qty 10

## 2022-02-01 MED ORDER — DEXAMETHASONE SODIUM PHOSPHATE 10 MG/ML IJ SOLN
4.0000 mg | Freq: Once | INTRAMUSCULAR | Status: AC
  Administered 2022-02-01: 4 mg via INTRAVENOUS
  Filled 2022-02-01: qty 1

## 2022-02-01 NOTE — Patient Instructions (Addendum)
Diarrhea:   Please use align probiotics as directed on the package Please stay hydrated with water and electrolyte drink such as Body Armour, Pedialyte. If you are diabetic try to find these with sucralose instead of natural sweeteners such as stevia, xylitol as they may worsen diarrhea.  Imodium-  Initial: 4 mg, followed by 2 mg every 2 to 4 hours or after each loose stool; for diarrhea persisting. Continue until 12 hours have passed without a loose bowel movement. 16 mg/day maximum dose  Lomotil-  4 times daily until control achieved, usually within 48 hours; maximum daily dose:(8 tablets or 40 mL per day).   Food Choices to Help Relieve Diarrhea, Adult Diarrhea can make you feel weak and cause you to become dehydrated. Dehydration is a condition in which there is not enough water or other fluids in the body. It is important to choose the right foods and drinks to: Relieve diarrhea. Replace lost fluids and nutrients. Prevent dehydration. What are tips for following this plan? Relieving diarrhea Avoid foods that make your diarrhea worse. These may include: Foods and drinks that are sweetened with high-fructose corn syrup, honey, or sweeteners such as xylitol, sorbitol, and mannitol. Check food labels for these ingredients. Fried, greasy, or spicy foods. Raw fruits and vegetables. Eat foods that are rich in probiotics. These include foods such as yogurt and fermented milk products. Probiotics can help increase healthy bacteria in your stomach and intestines (gastrointestinal or GI tract). This may help digestion and stop diarrhea. If you have lactose intolerance, avoid dairy products. These may make your diarrhea worse. Take medicine to help stop diarrhea only as told by your health care provider. Replacing nutrients  Eat bland, easy-to-digest foods in small amounts as you are able, until your diarrhea starts to get better. These foods include bananas, applesauce, rice, toast, and  crackers. Over time, add nutrient-rich foods as your body tolerates them or as told by your health care provider. These include: Well-cooked protein foods, such as eggs, lean meats like fish or chicken without skin, and tofu. Peeled, seeded, and soft-cooked fruits and vegetables. Low-fat dairy products. Whole grains. Take vitamin and mineral supplements as told by your health care provider. Preventing dehydration  Start by sipping water or a solution to prevent dehydration (oral rehydration solution, or ORS). This is a drink that helps replace fluids and minerals your body has lost. You can buy an ORS at pharmacies and retail stores. Try to drink at least 8-10 cups (2,000-2,500 mL) of fluid each day to help replace lost fluids. If your urine is pale yellow, you are getting enough fluids. You may drink other liquids in addition to water, such as fruit juice that you have added water to (diluted fruit juice) or low-calorie sports drinks, as tolerated or as told by your health care provider. Avoid drinks with caffeine, such as coffee, tea, or soft drinks. Avoid alcohol.

## 2022-02-01 NOTE — Addendum Note (Signed)
Addended by: Nelwyn Salisbury on: 02/01/2022 12:27 PM   Modules accepted: Orders

## 2022-02-01 NOTE — Progress Notes (Addendum)
Symptom Management [place] at [facility] Telephone:([phone] [phone]  Patient Care Team: [name], MD as PCP - General (Family Medicine) Clent Jacks, RN as Oncology Nurse Navigator Cammie Sickle, MD as Consulting Physician (Oncology)   Name of the patient: Albert Giles  [id number]  04/12/1945   Oncological History: Rectal Cancer Stage IVC(cT4a, cN1, cM1c)   Current Treatment: FOLFIRI + Bevacizumab Q14 days s/p cycle 9 -01/22/2022   Date of visit: 02/01/22  Reason for Consult: Albert Giles is a 77 y.o. male who presents today with his daughter for:  Diarrhea: Patient has had chronic watery brown/black diarrhea since September when he started FOLFIRI + Bevacizumab (previously on FOLFOX which was stopped after he got COVID-19) Atropine was added as a premed to try to lessen his diarrhea with mild improvement. Patient's daughter called in to our office yesterday reporting diarrhea and weakness of patient. She is also concerned as he is not eating or drinking much due to fears of increased diarrhea. He reports that he is drinking about 1 ensure a day and eating a few bites of things throughout the day. Has had met with Joli in nutrition and they have a plan but daughter says he will not eat much due to the fears surrounding the diarrhea. He denies any vomiting or fevers. No abdominal pain. He reports that he is only taking 2-3 lomotil per day but not consistently. Has not tried imodium. Is not following a BRAT diet. Not taking probiotics. Has some sore of the tongue which previously improved with magic mouthwash prescribed by Dr. Tasia Catchings however he reports he forgot that he had this to be able to use.   Has had stool studied which have been negative. He is being followed by GI.   No fevers, bleeding or bruising.   Wt Readings from Last 3 Encounters:  02/01/22 147 lb 14.4 oz (67.1 kg)  01/22/22 154 lb 12.8 oz (70.2 kg)   01/21/22 153 lb (69.4 kg)     PAST MEDICAL HISTORY: Past Medical History:  Diagnosis Date   CAD (coronary artery disease) 06/2014   UA with NSTEMI - cath with 99% prox L circ s/p stent, EF 40% ([place], [place] [facility])   Emphysema    mild   Ex-smoker    Family history of breast cancer    Family history of colon cancer    Family history of pancreatic cancer    History of COVID-19    NSTEMI (non-ST elevated myocardial infarction) ([place]) 07/13/2014   Rectal cancer ([place])     PAST SURGICAL HISTORY:  Past Surgical History:  Procedure Laterality Date   CARDIAC CATHETERIZATION N/A 07/14/2014   Left Heart Cath and Coronary Angiography with stent placement;  Surgeon: Teodoro Spray, MD   CARDIAC CATHETERIZATION N/A 07/14/2014   Coronary Stent Intervention;  Surgeon: Yolonda Kida, MD   COLONOSCOPY WITH PROPOFOL N/A 09/06/2020   Procedure: COLONOSCOPY WITH PROPOFOL;  Surgeon: Lin Landsman, MD;  Location: [facility] ENDOSCOPY;  Service: Gastroenterology;  Laterality: N/A;   CYSTECTOMY     on back   ESOPHAGOGASTRODUODENOSCOPY N/A 09/06/2020   Procedure: ESOPHAGOGASTRODUODENOSCOPY (EGD);  Surgeon: Lin Landsman, MD;  Location: [facility] ENDOSCOPY;  Service: Gastroenterology;  Laterality: N/A;   ESOPHAGOGASTRODUODENOSCOPY N/A 06/29/2021   Procedure: ESOPHAGOGASTRODUODENOSCOPY (EGD);  Surgeon: Lin Landsman, MD;  Location: [facility] ENDOSCOPY;  Service: Gastroenterology;  Laterality: N/A;   FLEXIBLE SIGMOIDOSCOPY N/A 12/20/2021   Procedure: FLEXIBLE SIGMOIDOSCOPY;  Surgeon: Marius Ditch,  Tally Due, MD;  Location: [facility];  Service: Gastroenterology;  Laterality: N/A;   INGUINAL HERNIA REPAIR Right 08/17/04   IR IMAGING GUIDED PORT INSERTION  09/13/2020    HEMATOLOGY/ONCOLOGY HISTORY:  Oncology History Overview Note  # Malignant partially obstructing tumor in the transverse colon/70 cm proximal to the anus- C. COLON MASS, 70 CM; COLD BIOPSY:  - INTRAMUCOSAL ADENOCARCINOMA AT LEAST;  #  One 20 mm polyp in the transverse colon, removed with mucosal resection. Resected and retrieved. Clips were placed. Tattooed.  # tumor in the mid rectum and at 10 cm proximal to the anus. Biopsied.    SEE COMMENT.   Comment:  There is no definitive evidence of invasion in this sample.  The  findings may not accurately represent the entire underlying lesion;  clinical correlation is recommended.   D. COLON POLYP, TRANSVERSE; HOT SNARE:  - TUBULOVILLOUS ADENOMA.  - NEGATIVE FOR HIGH GRADE DYSPLASIA AND MALIGNANCY.   E. RECTUM MASS; COLD BIOPSY:  - INVASIVE ADENOCARCINOMA, MODERATELY TO POORLY DIFFERENTIATED. ----------------------------------   # PET scan: SEP 2022-proximal right colon [adjacent mesenteric lymph nodes] and rectal hypermetabolism.  Additional subcentimeter bilateral hypermetabolic lymphadenopathy noted in the retroperitoneal/left external iliac; inferior right lobe of the liver concerning for metastatic disease; right lower quadrant soft tissue mass concerning for peritoneal carcinomatosis; bilateral solid pulmonary nodules ~5 mm; MRI rectum- T stage: T4a; N stage:  N1.   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 Marius Ditch.  No progressive rectal cancer noted; friable rectal mucosa      Rectal cancer ([place])  09/11/2020 Initial Diagnosis   Rectal cancer ([place])   09/25/2020 - 08/23/2021 Chemotherapy    Patient is on Treatment Plan : COLORECTAL FOLFOX q14d x 4 months     09/28/2020 Cancer Staging   Staging form: Colon and Rectum, AJCC 8th Edition - Clinical: Stage IVC (cT4a, cN1, cM1c) - Signed by Cammie Sickle, MD on 09/28/2020    Genetic Testing   Negative genetic testing. No pathogenic variants identified on the [facility] Multi-Cancer+RNA Panel. The report date is 11/05/2020.  The Multi-Cancer Panel + RNA offered by [facility] includes sequencing and/or deletion duplication testing of the following 84 genes: AIP, ALK, APC, ATM, AXIN2,BAP1,  BARD1, BLM, BMPR1A, BRCA1, BRCA2, BRIP1, CASR, CDC73, CDH1, CDK4, CDKN1B, CDKN1C, CDKN2A (p14ARF), CDKN2A (p16INK4a), CEBPA, CHEK2, CTNNA1, DICER1, DIS3L2, EGFR (c.2369C>T, p.Thr790Met variant only), EPCAM (Deletion/duplication testing only), FH, FLCN, GATA2, GPC3, GREM1 (Promoter region deletion/duplication testing only), HOXB13 (c.251G>A, p.Gly84Glu), HRAS, KIT, MAX, MEN1, MET, MITF (c.952G>A, p.Glu318Lys variant only), MLH1, MSH2, MSH3, MSH6, MUTYH, NBN, NF1, NF2, NTHL1, PALB2, PDGFRA, PHOX2B, PMS2, POLD1, POLE, POT1, PRKAR1A, PTCH1, PTEN, RAD50, RAD51C, RAD51D, RB1, RECQL4, RET, RUNX1, SDHAF2, SDHA (sequence changes only), SDHB, SDHC, SDHD, SMAD4, SMARCA4, SMARCB1, SMARCE1, STK11, SUFU, TERC, TERT, TMEM127, TP53, TSC1, TSC2, VHL, WRN and WT1.   09/04/2021 -  Chemotherapy   Patient is on Treatment Plan : COLORECTAL FOLFIRI + Bevacizumab q14d       ALLERGIES:  has No Known  Allergies.  MEDICATIONS:  Current Outpatient Medications  Medication Sig Dispense Refill   aspirin EC 81 MG tablet Take 1 tablet (81 mg total) by mouth daily. 60 tablet 1   atorvastatin (LIPITOR) 40 MG tablet Take 40 mg by mouth daily.     Cholecalciferol (VITAMIN D3) 25 MCG (1000 UT) CAPS Take 1 capsule (1,000 Units total) by mouth daily. 30 capsule    Cyanocobalamin (B-12) 1000 MCG SUBL Place 1 tablet under the tongue daily.     diphenoxylate-atropine (LOMOTIL) 2.5-0.025 MG  tablet Take 1 tablet by mouth 4 (four) times daily as needed for diarrhea or loose stools. Take it along with immodium 60 tablet 1   ferrous sulfate 324 (65 Fe) MG TBEC Take 1 tablet (325 mg total) by mouth every other day.     levofloxacin (LEVAQUIN) 750 MG tablet Take 1 tablet (750 mg total) by mouth daily for 7 days. 7 tablet 0   magic mouthwash (nystatin, lidocaine, diphenhydrAMINE, alum & mag hydroxide) suspension Swish and spit 5 mLs 4 (four) times daily as needed for mouth pain. 240 mL 1   metoprolol tartrate (LOPRESSOR) 25 MG tablet Take 1 tablet (25 mg total) by mouth 2 (two) times daily. (Patient taking differently: Take 25 mg by mouth daily at 12 noon.) 60 tablet 1   pantoprazole (PROTONIX) 40 MG tablet TAKE 1 TABLET BY MOUTH TWICE DAILY BEFORE A MEAL 60 tablet 11   triamcinolone ointment (KENALOG) 0.5 % Apply 1 Application topically 2 (two) times daily. 30 g 0   docusate sodium (COLACE) 100 MG capsule Take 100 mg by mouth 2 (two) times daily as needed. (Patient not taking: Reported on 02/01/2022)     polyethylene glycol (MIRALAX / GLYCOLAX) 17 g packet Take 17 g by mouth daily. (Patient not taking: Reported on 02/01/2022)     No current facility-administered medications for this visit.   Facility-Administered Medications Ordered in Other Visits  Medication Dose Route Frequency Provider Last Rate Last Admin   0.9 %  sodium chloride infusion   Intravenous Continuous Hughie Closs, PA-C   Stopped at 02/01/22 1159   sodium chloride flush (NS) 0.9 % injection 10 mL  10 mL Intravenous PRN Cammie Sickle, MD   10 mL at 10/09/20 0855    VITAL SIGNS: BP 113/73   Pulse 91   Temp 97.8 F (36.6 C) (Tympanic)   Resp 18   Ht '5\' 8"'$  (1.727 m)   Wt 147 lb 14.4 oz (67.1 kg)   BMI 22.49 kg/m  Filed Weights   02/01/22 1012  Weight: 147 lb 14.4 oz (67.1 kg)    Estimated body mass index is 22.49 kg/m as calculated from the following:   Height as of this encounter: '5\' 8"'$  (1.727 m).    Weight as of this encounter: 147 lb 14.4 oz (67.1 kg).  LABS: CBC:    Component Value Date/Time   WBC 2.4 (L) 02/01/2022 1000   HGB 11.1 (L) 02/01/2022 1000   HGB 11.5 (L) 01/21/2022 1507   HCT 34.7 (L) 02/01/2022 1000   HCT 34.7 (L) 01/21/2022 1507   PLT 50 (L) 02/01/2022 1000   PLT 113 (L) 01/21/2022 1507   MCV 103.0 (H) 02/01/2022 1000   MCV 101 (H) 01/21/2022 1507   NEUTROABS 0.7 (L) 02/01/2022 1000   LYMPHSABS 1.0 02/01/2022 1000   MONOABS 0.4 02/01/2022 1000   EOSABS 0.0 02/01/2022 1000   BASOSABS 0.1 02/01/2022 1000   Comprehensive Metabolic Panel:    Component  Value Date/Time   NA 136 02/01/2022 1000   NA 141 01/21/2022 1507   K 4.3 02/01/2022 1000   CL 104 02/01/2022 1000   CO2 22 02/01/2022 1000   BUN 23 02/01/2022 1000   BUN 16 01/21/2022 1507   CREATININE 1.53 (H) 02/01/2022 1000   GLUCOSE 119 (H) 02/01/2022 1000   CALCIUM 8.1 (L) 02/01/2022 1000   AST 36 02/01/2022 1000   ALT 29 02/01/2022 1000   ALKPHOS 240 (H) 02/01/2022 1000   BILITOT 1.9 (H) 02/01/2022 1000   PROT 5.7 (L) 02/01/2022 1000   ALBUMIN 2.9 (L) 02/01/2022 1000    RADIOGRAPHIC STUDIES: No results found.  PERFORMANCE STATUS (ECOG) : 2 - Symptomatic, <50% confined to bed  Review of Systems Unless otherwise noted, a complete review of systems is negative.  Physical Exam General: NAD  HEENT: Few scattered healing sores of inferior tongue Cardiovascular: regular rate and rhythm Pulmonary: clear ant fields Abdomen: soft, nontender, + bowel sounds GU: no suprapubic tenderness Extremities: no edema, no joint deformities Skin: no rashes Neurological: Weakness but otherwise nonfocal  Assessment and Plan- Patient is a 77 y.o. male    Encounter Diagnoses  Name Primary?   Rectal cancer ([place])    Port-A-Cath in place    Diarrhea, unspecified type    Chemotherapy-induced thrombocytopenia    Chemotherapy induced neutropenia ([facility]) Yes    In terms of his diarrhea he is not maximizing  his antidiarrheals. I have suggested that they use both imodium and lomotil to help with his diarrhea. See instructions which I have also discussed with patient. IN addition I would like for him to start probiotics, follow a bland bulky diet and stay hydrated with water and electrolyte drink. We will also give decadron today IV with 1L IVF to see if this will help with his weakness and diarrhea. He does not appear to have a significant decline in appetite but rather is holding off on oral intake due to fears of diarrhea. Correcting his diarrhea should help correct his nutritional deficit and hopefully would result in less weight loss. Due to his symptoms I would like to see him Monday of next week ahead of his Tuesday visit with our office. If symptoms have not improved on Monday I would like for his Oncologist to discuss with him possible dose reduction of his chemotherapy regimen if he sees fit.   Thrombocytopenia: Acute on chronic. Secondary to chemotherapy. No bleeding or bruising episodes. Has follow up on Monday for port labs. Reviewed precautions.   Neutropenia: Acute on chronic. WBC 2.4. ANC 0.7. Afebrile. No sign of infection. Had Fulphila on 01/24/2022. Sending in Levaquin 750 mg daily x 7. Discussed neutropenic fever warnings and advisement. Reviewed black box warning for the levaquin medication including arrhythmias, tendon rupture, glycemic changes.   Disposition 4 mg decadron IV, 1L IVF Levaquin 750 mg Daily x7  Nutritional visit follow up with Joli will need to be scheduled RTC Monday [facility], port labs (CBC w/, CMP, mag) +- fluids/mag/k  Has follow up already scheduled with our office for Tuesday (keep this as well)-[place]    Patient expressed understanding and was in agreement with this plan. He also understands that He can call clinic at any time with any questions, concerns, or complaints.   Thank you for allowing me to participate in the care of this very pleasant patient.   Time Total:  25  Visit consisted of counseling and education dealing with the complex and emotionally intense issues of  symptom management in the setting of serious illness.Greater than 50%  of this time was spent counseling and coordinating care related to the above assessment and plan.  Signed by: Nelwyn Salisbury, PA-C

## 2022-02-04 ENCOUNTER — Inpatient Hospital Stay: Payer: Medicare HMO

## 2022-02-04 ENCOUNTER — Other Ambulatory Visit: Payer: Self-pay

## 2022-02-04 ENCOUNTER — Inpatient Hospital Stay (HOSPITAL_BASED_OUTPATIENT_CLINIC_OR_DEPARTMENT_OTHER): Payer: Medicare HMO | Admitting: Medical Oncology

## 2022-02-04 VITALS — BP 106/78 | HR 79 | Temp 97.1°F | Ht 67.0 in | Wt 149.0 lb

## 2022-02-04 DIAGNOSIS — C20 Malignant neoplasm of rectum: Secondary | ICD-10-CM | POA: Diagnosis not present

## 2022-02-04 DIAGNOSIS — T451X5A Adverse effect of antineoplastic and immunosuppressive drugs, initial encounter: Secondary | ICD-10-CM

## 2022-02-04 DIAGNOSIS — D701 Agranulocytosis secondary to cancer chemotherapy: Secondary | ICD-10-CM

## 2022-02-04 DIAGNOSIS — I129 Hypertensive chronic kidney disease with stage 1 through stage 4 chronic kidney disease, or unspecified chronic kidney disease: Secondary | ICD-10-CM | POA: Diagnosis not present

## 2022-02-04 DIAGNOSIS — K922 Gastrointestinal hemorrhage, unspecified: Secondary | ICD-10-CM | POA: Diagnosis not present

## 2022-02-04 DIAGNOSIS — N183 Chronic kidney disease, stage 3 unspecified: Secondary | ICD-10-CM | POA: Diagnosis not present

## 2022-02-04 DIAGNOSIS — R197 Diarrhea, unspecified: Secondary | ICD-10-CM

## 2022-02-04 DIAGNOSIS — C786 Secondary malignant neoplasm of retroperitoneum and peritoneum: Secondary | ICD-10-CM | POA: Diagnosis not present

## 2022-02-04 DIAGNOSIS — E860 Dehydration: Secondary | ICD-10-CM

## 2022-02-04 DIAGNOSIS — R7989 Other specified abnormal findings of blood chemistry: Secondary | ICD-10-CM

## 2022-02-04 DIAGNOSIS — R112 Nausea with vomiting, unspecified: Secondary | ICD-10-CM | POA: Diagnosis not present

## 2022-02-04 DIAGNOSIS — D6959 Other secondary thrombocytopenia: Secondary | ICD-10-CM | POA: Diagnosis not present

## 2022-02-04 DIAGNOSIS — D509 Iron deficiency anemia, unspecified: Secondary | ICD-10-CM | POA: Diagnosis not present

## 2022-02-04 DIAGNOSIS — C7800 Secondary malignant neoplasm of unspecified lung: Secondary | ICD-10-CM | POA: Diagnosis not present

## 2022-02-04 DIAGNOSIS — C787 Secondary malignant neoplasm of liver and intrahepatic bile duct: Secondary | ICD-10-CM | POA: Diagnosis not present

## 2022-02-04 DIAGNOSIS — Z79899 Other long term (current) drug therapy: Secondary | ICD-10-CM | POA: Diagnosis not present

## 2022-02-04 DIAGNOSIS — E8809 Other disorders of plasma-protein metabolism, not elsewhere classified: Secondary | ICD-10-CM | POA: Diagnosis not present

## 2022-02-04 DIAGNOSIS — E46 Unspecified protein-calorie malnutrition: Secondary | ICD-10-CM | POA: Diagnosis not present

## 2022-02-04 LAB — CBC WITH DIFFERENTIAL/PLATELET
Abs Immature Granulocytes: 0.2 10*3/uL — ABNORMAL HIGH (ref 0.00–0.07)
Basophils Absolute: 0.1 10*3/uL (ref 0.0–0.1)
Basophils Relative: 1 %
Eosinophils Absolute: 0.2 10*3/uL (ref 0.0–0.5)
Eosinophils Relative: 2 %
HCT: 33.5 % — ABNORMAL LOW (ref 39.0–52.0)
Hemoglobin: 11.4 g/dL — ABNORMAL LOW (ref 13.0–17.0)
Immature Granulocytes: 2 %
Lymphocytes Relative: 13 %
Lymphs Abs: 1.5 10*3/uL (ref 0.7–4.0)
MCH: 34.4 pg — ABNORMAL HIGH (ref 26.0–34.0)
MCHC: 34 g/dL (ref 30.0–36.0)
MCV: 101.2 fL — ABNORMAL HIGH (ref 80.0–100.0)
Monocytes Absolute: 1.1 10*3/uL — ABNORMAL HIGH (ref 0.1–1.0)
Monocytes Relative: 9 %
Neutro Abs: 8.4 10*3/uL — ABNORMAL HIGH (ref 1.7–7.7)
Neutrophils Relative %: 73 %
Platelets: 93 10*3/uL — ABNORMAL LOW (ref 150–400)
RBC: 3.31 MIL/uL — ABNORMAL LOW (ref 4.22–5.81)
RDW: 19.1 % — ABNORMAL HIGH (ref 11.5–15.5)
Smear Review: NORMAL
WBC: 11.5 10*3/uL — ABNORMAL HIGH (ref 4.0–10.5)
nRBC: 0.2 % (ref 0.0–0.2)

## 2022-02-04 LAB — COMPREHENSIVE METABOLIC PANEL
ALT: 34 U/L (ref 0–44)
AST: 45 U/L — ABNORMAL HIGH (ref 15–41)
Albumin: 2.7 g/dL — ABNORMAL LOW (ref 3.5–5.0)
Alkaline Phosphatase: 241 U/L — ABNORMAL HIGH (ref 38–126)
Anion gap: 10 (ref 5–15)
BUN: 30 mg/dL — ABNORMAL HIGH (ref 8–23)
CO2: 23 mmol/L (ref 22–32)
Calcium: 8.1 mg/dL — ABNORMAL LOW (ref 8.9–10.3)
Chloride: 101 mmol/L (ref 98–111)
Creatinine, Ser: 2.2 mg/dL — ABNORMAL HIGH (ref 0.61–1.24)
GFR, Estimated: 30 mL/min — ABNORMAL LOW (ref >60)
Glucose, Bld: 137 mg/dL — ABNORMAL HIGH (ref 70–99)
Potassium: 3.9 mmol/L (ref 3.5–5.1)
Sodium: 134 mmol/L — ABNORMAL LOW (ref 135–145)
Total Bilirubin: 1 mg/dL (ref 0.3–1.2)
Total Protein: 5.2 g/dL — ABNORMAL LOW (ref 6.5–8.1)

## 2022-02-04 LAB — MAGNESIUM: Magnesium: 1.7 mg/dL (ref 1.7–2.4)

## 2022-02-04 MED ORDER — MIRTAZAPINE 7.5 MG PO TABS: 7.5000 mg | ORAL_TABLET | Freq: Every day | ORAL | 2 refills | Status: DC

## 2022-02-04 MED ORDER — HEPARIN SOD (PORK) LOCK FLUSH 100 UNIT/ML IV SOLN
500.0000 [IU] | Freq: Once | INTRAVENOUS | Status: AC
  Administered 2022-02-04: 500 [IU] via INTRAVENOUS
  Filled 2022-02-04: qty 5

## 2022-02-04 MED ORDER — SODIUM CHLORIDE 0.9% FLUSH
10.0000 mL | Freq: Once | INTRAVENOUS | Status: AC
  Administered 2022-02-04: 10 mL via INTRAVENOUS
  Filled 2022-02-04: qty 10

## 2022-02-04 MED ORDER — SODIUM CHLORIDE 0.9 % IV SOLN
Freq: Once | INTRAVENOUS | Status: AC
  Filled 2022-02-04: qty 250

## 2022-02-04 MED FILL — Dexamethasone Sodium Phosphate Inj 100 MG/10ML: INTRAMUSCULAR | Qty: 1 | Status: AC

## 2022-02-04 NOTE — Progress Notes (Signed)
Symptom Management [place] at [facility] [phone] [phone]  Patient Care Team: [name], MD as PCP - General (Family Medicine) Clent Jacks, RN as Oncology Nurse Navigator Cammie Sickle, MD as Consulting Physician (Oncology)   Name of the patient: Albert Giles  [id number]  10/03/1945   Oncological History: Rectal Cancer Stage IVC(cT4a, cN1, cM1c)   Current Treatment: FOLFIRI + Bevacizumab Q14 days s/p cycle 9 -01/22/2022   Date of visit: 02/04/22  Reason for Consult: Albert Giles is a 77 y.o. male who presents today for follow up of:  Diarrhea: Patient has had chronic off/on diarrhea since he started FOLFIRI + Bevacizumab back in Sept. Atropine has been added to his treatment plan with minimal improvement. Last week on 02/01/2022 patient's daughter called our office to see if he could get fluids as his diarrhea has been particularly worse and he was weak. At this visit he received IVF along with levafloxacin as he was found to be neutropenic.   Today he reports that he is feeling better. Less diarrhea. He is unable to tell me how many of the lomotil he is using but he does report that he is using this medication. He is still not eating/drinking well due to his loss of appetite since he started treatment. He is unsure if he has started using the magic mouthwash or not.   No fevers, bleeding or bruising.   Wt Readings from Last 3 Encounters:  02/04/22 149 lb (67.6 kg)  02/01/22 147 lb 14.4 oz (67.1 kg)  01/22/22 154 lb 12.8 oz (70.2 kg)     PAST MEDICAL HISTORY: Past Medical History:  Diagnosis Date   CAD (coronary artery disease) 06/2014   UA with NSTEMI - cath with 99% prox L circ s/p stent, EF 40% ([place], [place] [facility])   Emphysema    mild   Ex-smoker    Family history of breast cancer    Family history of colon cancer    Family history of pancreatic cancer    History of COVID-19     NSTEMI (non-ST elevated myocardial infarction) ([place]) 07/13/2014   Rectal cancer ([place])     PAST SURGICAL HISTORY:  Past Surgical History:  Procedure Laterality Date   CARDIAC CATHETERIZATION N/A 07/14/2014   Left Heart Cath and Coronary Angiography with stent placement;  Surgeon: Teodoro Spray, MD   CARDIAC CATHETERIZATION N/A 07/14/2014   Coronary Stent Intervention;  Surgeon: Yolonda Kida, MD   COLONOSCOPY WITH PROPOFOL N/A 09/06/2020   Procedure: COLONOSCOPY WITH PROPOFOL;  Surgeon: Lin Landsman, MD;  Location: [facility] ENDOSCOPY;  Service: Gastroenterology;  Laterality: N/A;   CYSTECTOMY     on back   ESOPHAGOGASTRODUODENOSCOPY N/A 09/06/2020   Procedure: ESOPHAGOGASTRODUODENOSCOPY (EGD);  Surgeon: Lin Landsman, MD;  Location: [facility] ENDOSCOPY;  Service: Gastroenterology;  Laterality: N/A;   ESOPHAGOGASTRODUODENOSCOPY N/A 06/29/2021   Procedure: ESOPHAGOGASTRODUODENOSCOPY (EGD);  Surgeon: Lin Landsman, MD;  Location: [facility] ENDOSCOPY;  Service: Gastroenterology;  Laterality: N/A;   FLEXIBLE SIGMOIDOSCOPY N/A 12/20/2021   Procedure: FLEXIBLE SIGMOIDOSCOPY;  Surgeon: Lin Landsman, MD;  Location: [facility] ENDOSCOPY;  Service: Gastroenterology;  Laterality: N/A;   INGUINAL HERNIA REPAIR Right 08/17/04   IR IMAGING GUIDED PORT INSERTION  09/13/2020    HEMATOLOGY/ONCOLOGY HISTORY:  Oncology History Overview Note  # Malignant partially obstructing tumor in the transverse colon/70 cm proximal to the anus- C. COLON MASS, 70 CM; COLD BIOPSY:  - INTRAMUCOSAL ADENOCARCINOMA AT LEAST;  #  One 20 mm polyp in the transverse colon, removed with mucosal resection. Resected and retrieved. Clips were placed. Tattooed.  # tumor in the mid rectum and at 10 cm proximal to the anus. Biopsied.    SEE COMMENT.   Comment:  There is no definitive evidence of invasion in this sample.  The  findings may not accurately represent the entire underlying lesion;  clinical correlation is  recommended.   D. COLON POLYP, TRANSVERSE; HOT SNARE:  - TUBULOVILLOUS ADENOMA.  - NEGATIVE FOR HIGH GRADE DYSPLASIA AND MALIGNANCY.   E. RECTUM MASS; COLD BIOPSY:  - INVASIVE ADENOCARCINOMA, MODERATELY TO POORLY DIFFERENTIATED. ----------------------------------   # PET scan: SEP 2022-proximal right colon [adjacent mesenteric lymph nodes] and rectal hypermetabolism.  Additional subcentimeter bilateral hypermetabolic lymphadenopathy noted in the retroperitoneal/left external iliac; inferior right lobe of the liver concerning for metastatic disease; right lower quadrant soft tissue mass concerning for peritoneal carcinomatosis; bilateral solid pulmonary nodules ~5 mm; MRI rectum- T stage: T4a; N stage:  N1.   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 Marius Ditch.  No progressive rectal cancer noted; friable rectal mucosa      Rectal cancer ([place])  09/11/2020 Initial Diagnosis   Rectal cancer ([place])   09/25/2020 - 08/23/2021 Chemotherapy   Patient is on Treatment Plan : COLORECTAL FOLFOX q14d x 4 months     09/28/2020 Cancer Staging   Staging form: Colon and Rectum, AJCC 8th Edition - Clinical: Stage IVC (cT4a, cN1, cM1c) - Signed by Cammie Sickle, MD on 09/28/2020    Genetic Testing   Negative genetic testing. No pathogenic variants identified on the [facility] Multi-Cancer+RNA Panel. The report date is  11/05/2020.  The Multi-Cancer Panel + RNA offered by [facility] includes sequencing and/or deletion duplication testing of the following 84 genes: AIP, ALK, APC, ATM, AXIN2,BAP1,  BARD1, BLM, BMPR1A, BRCA1, BRCA2, BRIP1, CASR, CDC73, CDH1, CDK4, CDKN1B, CDKN1C, CDKN2A (p14ARF), CDKN2A (p16INK4a), CEBPA, CHEK2, CTNNA1, DICER1, DIS3L2, EGFR (c.2369C>T, p.Thr790Met variant only), EPCAM (Deletion/duplication testing only), FH, FLCN, GATA2, GPC3, GREM1 (Promoter region deletion/duplication testing only), HOXB13 (c.251G>A, p.Gly84Glu), HRAS, KIT, MAX, MEN1, MET, MITF (c.952G>A, p.Glu318Lys variant only), MLH1, MSH2, MSH3, MSH6, MUTYH, NBN, NF1, NF2, NTHL1, PALB2, PDGFRA, PHOX2B, PMS2, POLD1, POLE, POT1, PRKAR1A, PTCH1, PTEN, RAD50, RAD51C, RAD51D, RB1, RECQL4, RET, RUNX1, SDHAF2, SDHA (sequence changes only), SDHB, SDHC, SDHD, SMAD4, SMARCA4, SMARCB1, SMARCE1, STK11, SUFU, TERC, TERT, TMEM127, TP53, TSC1, TSC2, VHL, WRN and WT1.   09/04/2021 -  Chemotherapy   Patient is on Treatment Plan : COLORECTAL FOLFIRI + Bevacizumab q14d       ALLERGIES:  has No Known Allergies.  MEDICATIONS:  Current Outpatient Medications  Medication Sig Dispense Refill   aspirin EC 81 MG tablet Take 1 tablet (81 mg total) by mouth daily. 60 tablet 1   atorvastatin (LIPITOR) 40 MG tablet Take 40 mg by mouth daily.     Cholecalciferol (VITAMIN D3) 25 MCG (1000 UT) CAPS Take 1 capsule (1,000 Units total) by mouth daily. 30 capsule  Cyanocobalamin (B-12) 1000 MCG SUBL Place 1 tablet under the tongue daily.     diphenoxylate-atropine (LOMOTIL) 2.5-0.025 MG tablet Take 1 tablet by mouth 4 (four) times daily as needed for diarrhea or loose stools. Take it along with immodium 60 tablet 1   levofloxacin (LEVAQUIN) 750 MG tablet Take 1 tablet (750 mg total) by mouth daily for 7 days. 7 tablet 0   magic mouthwash (nystatin, lidocaine, diphenhydrAMINE, alum & mag hydroxide) suspension Swish and spit 5 mLs 4 (four) times daily as needed for  mouth pain. 240 mL 1   metoprolol tartrate (LOPRESSOR) 25 MG tablet Take 1 tablet (25 mg total) by mouth 2 (two) times daily. (Patient taking differently: Take 25 mg by mouth daily at 12 noon.) 60 tablet 1   pantoprazole (PROTONIX) 40 MG tablet TAKE 1 TABLET BY MOUTH TWICE DAILY BEFORE A MEAL 60 tablet 11   docusate sodium (COLACE) 100 MG capsule Take 100 mg by mouth 2 (two) times daily as needed. (Patient not taking: Reported on 02/01/2022)     ferrous sulfate 324 (65 Fe) MG TBEC Take 1 tablet (325 mg total) by mouth every other day.     polyethylene glycol (MIRALAX / GLYCOLAX) 17 g packet Take 17 g by mouth daily. (Patient not taking: Reported on 02/01/2022)     triamcinolone ointment (KENALOG) 0.5 % Apply 1 Application topically 2 (two) times daily. (Patient not taking: Reported on 02/04/2022) 30 g 0   No current facility-administered medications for this visit.   Facility-Administered Medications Ordered in Other Visits  Medication Dose Route Frequency Provider Last Rate Last Admin   heparin lock flush 100 unit/mL  500 Units Intravenous Once Charlaine Dalton R, MD       sodium chloride flush (NS) 0.9 % injection 10 mL  10 mL Intravenous PRN Cammie Sickle, MD   10 mL at 10/09/20 0855    VITAL SIGNS: BP 106/78 (BP Location: Right Arm, Patient Position: Sitting, Cuff Size: Normal)   Pulse 79   Temp (!) 97.1 F (36.2 C)   Ht '5\' 7"'$  (1.702 m)   Wt 149 lb (67.6 kg)   BMI 23.34 kg/m  Filed Weights   02/04/22 1306  Weight: 149 lb (67.6 kg)    Estimated body mass index is 23.34 kg/m as calculated from the following:   Height as of this encounter: '5\' 7"'$  (1.702 m).   Weight as of this encounter: 149 lb (67.6 kg).  LABS: CBC:    Component Value Date/Time   WBC 2.4 (L) 02/01/2022 1000   HGB 11.1 (L) 02/01/2022 1000   HGB 11.5 (L) 01/21/2022 1507   HCT 34.7 (L) 02/01/2022 1000   HCT 34.7 (L) 01/21/2022 1507   PLT 50 (L) 02/01/2022 1000   PLT 113 (L) 01/21/2022 1507   MCV  103.0 (H) 02/01/2022 1000   MCV 101 (H) 01/21/2022 1507   NEUTROABS 0.7 (L) 02/01/2022 1000   LYMPHSABS 1.0 02/01/2022 1000   MONOABS 0.4 02/01/2022 1000   EOSABS 0.0 02/01/2022 1000   BASOSABS 0.1 02/01/2022 1000   Comprehensive Metabolic Panel:    Component Value Date/Time   NA 134 (L) 02/04/2022 1252   NA 141 01/21/2022 1507   K 3.9 02/04/2022 1252   CL 101 02/04/2022 1252   CO2 23 02/04/2022 1252   BUN 30 (H) 02/04/2022 1252   BUN 16 01/21/2022 1507   CREATININE 2.20 (H) 02/04/2022 1252   GLUCOSE 137 (H) 02/04/2022 1252   CALCIUM 8.1 (L) 02/04/2022  1252   AST 45 (H) 02/04/2022 1252   ALT 34 02/04/2022 1252   ALKPHOS 241 (H) 02/04/2022 1252   BILITOT 1.0 02/04/2022 1252   PROT 5.2 (L) 02/04/2022 1252   ALBUMIN 2.7 (L) 02/04/2022 1252    RADIOGRAPHIC STUDIES: No results found.  PERFORMANCE STATUS (ECOG) : 2 - Symptomatic, <50% confined to bed  Review of Systems Unless otherwise noted, a complete review of systems is negative.  Physical Exam General: NAD  HEENT: Few scattered healing sores of inferior tongue Cardiovascular: regular rate and rhythm Pulmonary: clear ant fields Abdomen: soft, nontender, + bowel sounds GU: no suprapubic tenderness Extremities: no edema, no joint deformities Skin: no rashes Neurological: Weakness but otherwise nonfocal  Assessment and Plan- Patient is a 77 y.o. male    Encounter Diagnoses  Name Primary?   Diarrhea, unspecified type Yes   Rectal cancer (HCC)    Dehydration    Chemotherapy-induced thrombocytopenia    Chemotherapy induced neutropenia (HCC)     Diarrhea: Improved with lomotil. He is not sure how many he has been taking. He will try to keep a record of this. He is not using OTC medications such as imodium. 1L IVF planned for today given his hyponatremia, low oral intake, and elevated creatinine. Continue follow up with [facility] which he has scheduled for 02/07/2022. Mirtazipine to be added. May benefit from palliative  consult if the mirtazipine is not helpful in improving his appetite. Warm water and baking soda swish and spit sessions may be helpful as well.   Thrombocytopenia: Improving with a value of 93 up from 50. No bleeding or bruising noted.   Neutropenia: Improved. WBC now 11.5 and ANC is 8.4- some effects from leukocytosis as well.   Disposition 1L IVF today Mirtazipine added Has follow up (in [place]) scheduled for tomorrow with Dr. Rogue Bussing   Patient expressed understanding and was in agreement with this plan. He also understands that He can call clinic at any time with any questions, concerns, or complaints.   Thank you for allowing me to participate in the care of this very pleasant patient.   Time Total: 25  Visit consisted of counseling and education dealing with the complex and emotionally intense issues of symptom management in the setting of serious illness.Greater than 50%  of this time was spent counseling and coordinating care related to the above assessment and plan.  Signed by: Nelwyn Salisbury, PA-C

## 2022-02-05 ENCOUNTER — Telehealth: Payer: Self-pay | Admitting: Pharmacist

## 2022-02-05 ENCOUNTER — Inpatient Hospital Stay: Payer: Medicare HMO

## 2022-02-05 ENCOUNTER — Telehealth: Payer: Self-pay

## 2022-02-05 ENCOUNTER — Encounter: Payer: Self-pay | Admitting: Internal Medicine

## 2022-02-05 ENCOUNTER — Inpatient Hospital Stay (HOSPITAL_BASED_OUTPATIENT_CLINIC_OR_DEPARTMENT_OTHER): Payer: Medicare HMO | Admitting: Internal Medicine

## 2022-02-05 ENCOUNTER — Other Ambulatory Visit (HOSPITAL_COMMUNITY): Payer: Self-pay

## 2022-02-05 VITALS — BP 110/79 | HR 102 | Temp 97.9°F | Resp 16 | Ht 67.0 in | Wt 152.3 lb

## 2022-02-05 DIAGNOSIS — N183 Chronic kidney disease, stage 3 unspecified: Secondary | ICD-10-CM | POA: Diagnosis not present

## 2022-02-05 DIAGNOSIS — Z79899 Other long term (current) drug therapy: Secondary | ICD-10-CM | POA: Diagnosis not present

## 2022-02-05 DIAGNOSIS — D509 Iron deficiency anemia, unspecified: Secondary | ICD-10-CM | POA: Diagnosis not present

## 2022-02-05 DIAGNOSIS — C20 Malignant neoplasm of rectum: Secondary | ICD-10-CM

## 2022-02-05 DIAGNOSIS — R112 Nausea with vomiting, unspecified: Secondary | ICD-10-CM | POA: Diagnosis not present

## 2022-02-05 DIAGNOSIS — E8809 Other disorders of plasma-protein metabolism, not elsewhere classified: Secondary | ICD-10-CM | POA: Diagnosis not present

## 2022-02-05 DIAGNOSIS — I129 Hypertensive chronic kidney disease with stage 1 through stage 4 chronic kidney disease, or unspecified chronic kidney disease: Secondary | ICD-10-CM | POA: Diagnosis not present

## 2022-02-05 DIAGNOSIS — Z95828 Presence of other vascular implants and grafts: Secondary | ICD-10-CM

## 2022-02-05 DIAGNOSIS — E46 Unspecified protein-calorie malnutrition: Secondary | ICD-10-CM | POA: Diagnosis not present

## 2022-02-05 DIAGNOSIS — R197 Diarrhea, unspecified: Secondary | ICD-10-CM | POA: Diagnosis not present

## 2022-02-05 DIAGNOSIS — K529 Noninfective gastroenteritis and colitis, unspecified: Secondary | ICD-10-CM | POA: Diagnosis not present

## 2022-02-05 DIAGNOSIS — C787 Secondary malignant neoplasm of liver and intrahepatic bile duct: Secondary | ICD-10-CM | POA: Diagnosis not present

## 2022-02-05 DIAGNOSIS — K922 Gastrointestinal hemorrhage, unspecified: Secondary | ICD-10-CM | POA: Diagnosis not present

## 2022-02-05 DIAGNOSIS — D500 Iron deficiency anemia secondary to blood loss (chronic): Secondary | ICD-10-CM

## 2022-02-05 DIAGNOSIS — C786 Secondary malignant neoplasm of retroperitoneum and peritoneum: Secondary | ICD-10-CM | POA: Diagnosis not present

## 2022-02-05 DIAGNOSIS — C7800 Secondary malignant neoplasm of unspecified lung: Secondary | ICD-10-CM | POA: Diagnosis not present

## 2022-02-05 DIAGNOSIS — R7989 Other specified abnormal findings of blood chemistry: Secondary | ICD-10-CM

## 2022-02-05 LAB — COMPREHENSIVE METABOLIC PANEL
ALT: 32 U/L (ref 0–44)
AST: 43 U/L — ABNORMAL HIGH (ref 15–41)
Albumin: 2.7 g/dL — ABNORMAL LOW (ref 3.5–5.0)
Alkaline Phosphatase: 252 U/L — ABNORMAL HIGH (ref 38–126)
Anion gap: 11 (ref 5–15)
BUN: 31 mg/dL — ABNORMAL HIGH (ref 8–23)
CO2: 22 mmol/L (ref 22–32)
Calcium: 8.2 mg/dL — ABNORMAL LOW (ref 8.9–10.3)
Chloride: 100 mmol/L (ref 98–111)
Creatinine, Ser: 2.3 mg/dL — ABNORMAL HIGH (ref 0.61–1.24)
GFR, Estimated: 28 mL/min — ABNORMAL LOW (ref >60)
Glucose, Bld: 124 mg/dL — ABNORMAL HIGH (ref 70–99)
Potassium: 4 mmol/L (ref 3.5–5.1)
Sodium: 133 mmol/L — ABNORMAL LOW (ref 135–145)
Total Bilirubin: 0.8 mg/dL (ref 0.3–1.2)
Total Protein: 5.1 g/dL — ABNORMAL LOW (ref 6.5–8.1)

## 2022-02-05 LAB — CBC WITH DIFFERENTIAL/PLATELET
Abs Immature Granulocytes: 0.16 10*3/uL — ABNORMAL HIGH (ref 0.00–0.07)
Basophils Absolute: 0.1 10*3/uL (ref 0.0–0.1)
Basophils Relative: 1 %
Eosinophils Absolute: 0.2 10*3/uL (ref 0.0–0.5)
Eosinophils Relative: 2 %
HCT: 35.1 % — ABNORMAL LOW (ref 39.0–52.0)
Hemoglobin: 11.8 g/dL — ABNORMAL LOW (ref 13.0–17.0)
Immature Granulocytes: 2 %
Lymphocytes Relative: 11 %
Lymphs Abs: 1.1 10*3/uL (ref 0.7–4.0)
MCH: 34 pg (ref 26.0–34.0)
MCHC: 33.6 g/dL (ref 30.0–36.0)
MCV: 101.2 fL — ABNORMAL HIGH (ref 80.0–100.0)
Monocytes Absolute: 1.1 10*3/uL — ABNORMAL HIGH (ref 0.1–1.0)
Monocytes Relative: 11 %
Neutro Abs: 7.5 10*3/uL (ref 1.7–7.7)
Neutrophils Relative %: 73 %
Platelets: 103 10*3/uL — ABNORMAL LOW (ref 150–400)
RBC: 3.47 MIL/uL — ABNORMAL LOW (ref 4.22–5.81)
RDW: 19.6 % — ABNORMAL HIGH (ref 11.5–15.5)
Smear Review: NORMAL
WBC: 10.1 10*3/uL (ref 4.0–10.5)
nRBC: 0 % (ref 0.0–0.2)

## 2022-02-05 LAB — URINALYSIS, DIPSTICK ONLY
Bilirubin Urine: NEGATIVE
Glucose, UA: NEGATIVE mg/dL
Hgb urine dipstick: NEGATIVE
Ketones, ur: NEGATIVE mg/dL
Leukocytes,Ua: NEGATIVE
Nitrite: NEGATIVE
Protein, ur: NEGATIVE mg/dL
Specific Gravity, Urine: 1.021 (ref 1.005–1.030)
pH: 5 (ref 5.0–8.0)

## 2022-02-05 LAB — PROTEIN / CREATININE RATIO, URINE
Creatinine, Urine: 281 mg/dL
Protein Creatinine Ratio: 0.08 mg/mg{Cre} (ref 0.00–0.15)
Total Protein, Urine: 22 mg/dL

## 2022-02-05 MED ORDER — ONDANSETRON HCL 4 MG/2ML IJ SOLN
8.0000 mg | Freq: Once | INTRAMUSCULAR | Status: AC
  Administered 2022-02-05: 8 mg via INTRAVENOUS
  Filled 2022-02-05: qty 4

## 2022-02-05 MED ORDER — REGORAFENIB 40 MG PO TABS
80.0000 mg | ORAL_TABLET | Freq: Every day | ORAL | 0 refills | Status: DC
  Filled 2022-02-05: qty 42, 21d supply, fill #0
  Filled 2022-02-06: qty 42, 28d supply, fill #0

## 2022-02-05 MED ORDER — LIDOCAINE-PRILOCAINE 2.5-2.5 % EX CREA: TOPICAL_CREAM | CUTANEOUS | 3 refills | Status: DC

## 2022-02-05 MED ORDER — SODIUM CHLORIDE 0.9 % IV SOLN: Freq: Once | INTRAVENOUS | Status: DC

## 2022-02-05 MED ORDER — HEPARIN SOD (PORK) LOCK FLUSH 100 UNIT/ML IV SOLN
500.0000 [IU] | Freq: Once | INTRAVENOUS | Status: AC
  Administered 2022-02-05: 500 [IU] via INTRAVENOUS
  Filled 2022-02-05: qty 5

## 2022-02-05 MED ORDER — SODIUM CHLORIDE 0.9 % IV SOLN
Freq: Once | INTRAVENOUS | Status: AC
  Filled 2022-02-05: qty 250

## 2022-02-05 NOTE — Patient Instructions (Signed)
#  Recommend taking Imodium 1 pill after each bowel movement.  However after 3 or 4 bowel movements-with the Imodium-I would recommend adding Lomotil every 6 hours or so.

## 2022-02-05 NOTE — Telephone Encounter (Signed)
Oral Oncology Patient Advocate Encounter   Was successful in securing patient a $ 6,000 grant from [employer] to provide copayment coverage for [employer].  The patient's out of pocket cost will be $0.00 monthly.     I have spoken with the patient.    The billing information is as follows and has been shared with [place].   Member ID: [id number] Group ID: [id number] RxBin: [id number] Dates of Eligibility: 02/05/22 through 01/14/23   Berdine Addison, [place] Patient Watch Jerrel Tiberio  [phone] (phone) [phone] (fax) 02/05/2022 9:58 AM
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
## 2022-02-05 NOTE — Telephone Encounter (Signed)
Oral Oncology Patient Advocate Encounter   Was successful in securing patient a $5,000 ($10,000 cap) grant from [employer] to provide copayment coverage for Stivarga.  This will keep the out of pocket expense at $0.     I have spoken with the patient.    The billing information is as follows and has been shared with [facility].   Member ID: [id number] Group ID: [id number] RxBin: [id number] PCN: [id number] Dates of Eligibility: 02/05/22 through 02/06/23  Fund name:  Metastatic Colon or Colorectal Cancer.   Berdine Addison, [facility] Patient [place]  [phone] (phone) [phone] (fax) 02/05/2022 9:57 AM

## 2022-02-05 NOTE — Patient Instructions (Signed)

## 2022-02-05 NOTE — Patient Instructions (Signed)
[facility]  Discharge Instructions: Thank you for choosing [facility] to provide your oncology and hematology care.  If you have a lab appointment with the [facility], please go directly to the [facility] and check in at the registration area.  Wear comfortable clothing and clothing appropriate for easy access to any Portacath or PICC line.   We strive to give you quality time with your provider. You may need to reschedule your appointment if you arrive late (15 or more minutes).  Arriving late affects you and other patients whose appointments are after yours.  Also, if you miss three or more appointments without notifying the office, you may be dismissed from the clinic at the provider's discretion.      For prescription refill requests, have your pharmacy contact our office and allow 72 hours for refills to be completed.    Today you received the following chemotherapy and/or immunotherapy agents HYDRATION and ZOFAN      To help prevent nausea and vomiting after your treatment, we encourage you to take your nausea medication as directed.  BELOW ARE SYMPTOMS THAT SHOULD BE REPORTED IMMEDIATELY: *FEVER GREATER THAN 100.4 F (38 C) OR HIGHER *CHILLS OR SWEATING *NAUSEA AND VOMITING THAT IS NOT CONTROLLED WITH YOUR NAUSEA MEDICATION *UNUSUAL SHORTNESS OF BREATH *UNUSUAL BRUISING OR BLEEDING *URINARY PROBLEMS (pain or burning when urinating, or frequent urination) *BOWEL PROBLEMS (unusual diarrhea, constipation, pain near the anus) TENDERNESS IN MOUTH AND THROAT WITH OR WITHOUT PRESENCE OF ULCERS (sore throat, sores in mouth, or a toothache) UNUSUAL RASH, SWELLING OR PAIN  UNUSUAL VAGINAL DISCHARGE OR ITCHING   Items with * indicate a potential emergency and should be followed up as soon as possible or go to the Emergency Department if any problems should occur.  Please show the CHEMOTHERAPY ALERT CARD or IMMUNOTHERAPY ALERT CARD at  check-in to the Emergency Department and triage nurse.  Should you have questions after your visit or need to cancel or reschedule your appointment, please contact [facility]  [phone] and follow the prompts.  Office hours are 8:00 a.m. to 4:30 p.m. Monday - Friday. Please note that voicemails left after 4:00 p.m. may not be returned until the following business day.  We are closed weekends and major holidays. You have access to a nurse at all times for urgent questions. Please call the main number to the clinic [phone] and follow the prompts.  For any non-urgent questions, you may also contact your provider using MyChart. We now offer e-Visits for anyone 79 and older to request care online for non-urgent symptoms. For details visit [email or web address].   Also download the MyChart app! Go to the app store, search "MyChart", open the app, select [facility], and log in with your MyChart username and password.

## 2022-02-05 NOTE — Telephone Encounter (Unsigned)
Oral Oncology Pharmacist Encounter  Received new prescription for Stivarga (regorafenib) for the treatment of metastatic rectal cancer, planned duration until disease progression or unacceptable drug toxicity.  CMP from 02/05/22 assessed, no relevant lab abnormalities. Prescription dose and frequency assessed. Patient will be started on a reduced dose then increase as tolerated.   Current medication list in Epic reviewed, one DDIs with regorafenib identified: Atorvastatin: Regorafenib may increase the serum concentration of atorvastatin. Monitor for increased effects/toxicities of atorvastatin. No baseline dose adjustment needed.  Evaluated chart and no patient barriers to medication adherence identified.   Prescription has been e-scribed to the [facility] for benefits analysis and approval.  Oral Oncology Clinic will continue to follow for insurance authorization, copayment issues, initial counseling and start date.   Darl Pikes, PharmD, BCPS, BCOP, CPP Hematology/Oncology Clinical Pharmacist Practitioner [place]/DB/AP [facility] [phone]  02/05/2022 10:50 AM

## 2022-02-05 NOTE — Progress Notes (Signed)
Nutrition Follow-up:  Patient with colon cancer.  Patient receiving folfiri and bevacizumab.  Noted appointments with [facility] due to diarrhea.   Met with patient in infusion (no chemo, fluids).  Planning to switch to oral chemotherapy drug.  Patient said that he was nauseated and vomited once coming to the clinic this am.  Says that he is usually not nauseated.  Says that since [place] in Sept/Oct of 2023 appetite has not been good.  Usually eats cereal and banana for breakfast.  Sometimes has sandwich for lunch. Likes fruit.  Supper wife cooks.  Last night did not eat much.  Drinks ensure shakes BID.  Thinks he is taking the diarrhea medications.  "My daughter and wife fix my medicine."  Says he does not having any burning in his mouth or mouth pain.  Patient is drinking gatorade and other liquids.     Medications: mirtazipine added  Labs: Na 133, glucose 124, BUN 31, creatinine 2.30  Anthropometrics:   Weight 152 lb 4.8 oz today (fluids yesterday)  159 lb 6.4 oz on 12/26 161 lb on 11/28 171 lb on 9/22    NUTRITION DIAGNOSIS: Inadequate oral intake continues    INTERVENTION:  Discussed foods to eat when having diarrhea.  Handout provided Encouraged fluid intake  Continue oral nutrition supplement 2-3 times per day Cancelled 1/25 appointment as RD able to see patient today.  Discussed this with patient and he agreed    MONITORING, EVALUATION, GOAL: weight trends, intake   NEXT VISIT: Tuesday, Feb 13 phone call  Albert Giles B. Zenia Resides, [place], [place] Registered Dietitian [phone]

## 2022-02-05 NOTE — Telephone Encounter (Signed)
Oral Oncology Patient Advocate Encounter  Prior Authorization for Marylyn Ishihara has been approved.    PA# [id number]  Effective dates: 01/14/22 through 01/14/23  Patients co-pay is $3,311.93.    Berdine Addison, [facility] Patient [place]  [phone] (phone) [phone] (fax) 02/05/2022 10:38 AM

## 2022-02-05 NOTE — Progress Notes (Signed)
Patient feeling nauseas this morning.  Last diarrhea episode was on Sunday and now taking Lomitil with Imodium more consistent.  The dosing of Imodium and Lomitl every 4 hours keeps the diarrhea under control.

## 2022-02-05 NOTE — Assessment & Plan Note (Addendum)
#  STAGE IV-[N-RAS MUTATED] Synchronous primaries a] rectal cancer-10 cm-adenocarcinoma & b] transverse colon-- intramucosal adenoca]; abdominal lymphadenopathy; liver metastases; omental metastases. AUG 2023- Currently on FOLFIRI+ m-Vasi.   DEC 8th, 2023- Response to therapy of pulmonary metastasis; No new or progressive disease. Currently s/p FOLFIRI + M-vasi #9.    # Given the ongoing diarrhea poor tolerance to therapy-recommend holding FOLFIRI + M-vasi #10.  Will plan repeat imaging in FEB-March 2024.  Recommend starting the patient on Regarafenib. 80 mg/ day x cycle #1- and the increase to 120 mg cycle #2; and then if tolerating well increase to 160 mg 6 #3.  Patient will take 3 weeks on 1 week off.  Also discussed the potential side effects including but not limited to worsening fatigue diarrhea hand-foot syndrome.  Discussed with pharmacy.  # Nausea/vomiting- started this AM-clinically not suggestive of bowel obstruction.  Hold off any imaging at this time.  IV fluids/antiemetics.  Discussed with the daughter that if patient complains of intractable nausea vomiting recommend urgent evaluation with as ordered in the emergency room given the concern of possible bowel obstruction.  # Diarrhea- G-3 [sec to Irinotecan]- will discontinue FOLFIRI+ M-vasi- Sec to chemo-Irinotecan Recommend taking Imodium 1 pill after each bowel movement.  However after 3 or 4 bowel movements-with the Imodium-I would recommend adding Lomotil every 6 hours or so.send UGTA-1 genetic testing  #Intermittent rectal bleeding- [DEC, 2023]- flexible sigmoidoscopy with Dr. Marius Ditch.  No progressive rectal cancer noted; friable rectal mucosa thought because of patient's bleeding-okay to restart M-vasi.  But monitor closely.  Stable.   # Epistaxis- G-1-sec to Bev/ dry weather- monitor for now. Stable.   # Malnutrition/hypoalbuminemia-discussed regarding increased protein intake.  S/p evaluation with nutrition [JAN 2024]-will switch to  virtual visit.  # Anemia/iron deficiency hemoglobin ~12  secondary to chronic GI bleeding/tumor-slightly worse; see above. NOV Iron sat- 17; ferritin- 100.   # Thrombocytopenia/secondary chemotherapy/on aspirin-platelets- 93 [HOLD para <80]- -monitor closely- stable.   # HTN- 150s/94; at home BP reviewed- 110-130-/ DBP80-90s.continue checking BP at home/reviewed the log from home.  Proceed with M-vasi today.   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 discontinued oxaliplatin for now. Poor tolerance to cymbalta 30 mg q day. Stable.    # Chronic kidney disease stage III-[GFR-28  Para >2 ] - Continue keeping up with hydration-worsened recommend IV fluids antiemetics.  * CEA- not marker;  # DISPOSITION:  # IVF over 1 hours; Zofran  '8mg'$  x1 IVP  # change the nutrition evaluation to telephone.   # HOLD FOLFIRI + M-vasi today; cancel pump off after 48 hours/fulphila.  # follow up in 2 weeks- MD; labs- - cbc/cmp;NO chemo- ADD UGTA-1 Dr.B  # 40 minutes face-to-face with the patient discussing the above plan of care; more than 50% of time spent on prognosis/ natural history; counseling and coordination.

## 2022-02-05 NOTE — Telephone Encounter (Signed)
Oral Oncology Patient Advocate Encounter   Received notification that prior authorization for [place] is required.   PA submitted on 02/05/22  Key [id number]  Status is pending     Berdine Addison, [place] Patient [place]  [phone] (phone) [phone] (fax) 02/05/2022 10:19 AM

## 2022-02-06 ENCOUNTER — Other Ambulatory Visit: Payer: Self-pay

## 2022-02-06 ENCOUNTER — Other Ambulatory Visit (HOSPITAL_COMMUNITY): Payer: Self-pay

## 2022-02-07 ENCOUNTER — Other Ambulatory Visit (HOSPITAL_COMMUNITY): Payer: Self-pay

## 2022-02-07 ENCOUNTER — Inpatient Hospital Stay: Payer: Medicare HMO

## 2022-02-11 ENCOUNTER — Telehealth: Payer: Self-pay | Admitting: *Deleted

## 2022-02-11 NOTE — Telephone Encounter (Signed)
Daughter Lenna Sciara called reporting that patient received his chemotherapy pills in mail Saturday and was told to call us before he starts taking them. When should he start them?  His next appointment is 02/19/22

## 2022-02-12 ENCOUNTER — Encounter: Payer: Self-pay | Admitting: Internal Medicine

## 2022-02-12 NOTE — Telephone Encounter (Signed)
Call returned to [facility] and advised to not take his medicine until seen 02/19/22. She repeated this back to me stating that "That's what I thought,  but I wanted to be sure"

## 2022-02-14 ENCOUNTER — Other Ambulatory Visit (HOSPITAL_COMMUNITY): Payer: Self-pay

## 2022-02-15 ENCOUNTER — Telehealth: Payer: Self-pay | Admitting: *Deleted

## 2022-02-15 ENCOUNTER — Emergency Department: Payer: Medicare HMO

## 2022-02-15 ENCOUNTER — Other Ambulatory Visit: Payer: Self-pay

## 2022-02-15 ENCOUNTER — Emergency Department
Admission: EM | Admit: 2022-02-15 | Discharge: 2022-02-15 | Disposition: A | Discharge status: Home / Self Care | Payer: Medicare HMO | Attending: Student in an Organized Health Care Education/Training Program | Admitting: Student in an Organized Health Care Education/Training Program

## 2022-02-15 DIAGNOSIS — Z20822 Contact with and (suspected) exposure to covid-19: Secondary | ICD-10-CM | POA: Insufficient documentation

## 2022-02-15 DIAGNOSIS — Z7982 Long term (current) use of aspirin: Secondary | ICD-10-CM | POA: Diagnosis not present

## 2022-02-15 DIAGNOSIS — R0789 Other chest pain: Secondary | ICD-10-CM | POA: Diagnosis not present

## 2022-02-15 DIAGNOSIS — R791 Abnormal coagulation profile: Secondary | ICD-10-CM | POA: Insufficient documentation

## 2022-02-15 DIAGNOSIS — I700 Atherosclerosis of aorta: Secondary | ICD-10-CM | POA: Insufficient documentation

## 2022-02-15 DIAGNOSIS — R918 Other nonspecific abnormal finding of lung field: Secondary | ICD-10-CM | POA: Insufficient documentation

## 2022-02-15 DIAGNOSIS — I3139 Other pericardial effusion (noninflammatory): Secondary | ICD-10-CM | POA: Diagnosis not present

## 2022-02-15 DIAGNOSIS — Z7902 Long term (current) use of antithrombotics/antiplatelets: Secondary | ICD-10-CM | POA: Diagnosis not present

## 2022-02-15 DIAGNOSIS — I2510 Atherosclerotic heart disease of native coronary artery without angina pectoris: Secondary | ICD-10-CM | POA: Diagnosis not present

## 2022-02-15 DIAGNOSIS — R079 Chest pain, unspecified: Secondary | ICD-10-CM | POA: Diagnosis not present

## 2022-02-15 DIAGNOSIS — R188 Other ascites: Secondary | ICD-10-CM | POA: Insufficient documentation

## 2022-02-15 DIAGNOSIS — R109 Unspecified abdominal pain: Secondary | ICD-10-CM | POA: Diagnosis not present

## 2022-02-15 DIAGNOSIS — I517 Cardiomegaly: Secondary | ICD-10-CM | POA: Diagnosis not present

## 2022-02-15 DIAGNOSIS — R404 Transient alteration of awareness: Secondary | ICD-10-CM | POA: Diagnosis not present

## 2022-02-15 DIAGNOSIS — R072 Precordial pain: Secondary | ICD-10-CM | POA: Diagnosis not present

## 2022-02-15 DIAGNOSIS — R7989 Other specified abnormal findings of blood chemistry: Secondary | ICD-10-CM | POA: Diagnosis not present

## 2022-02-15 LAB — CBC
HCT: 39.3 % (ref 39.0–52.0)
Hemoglobin: 12.5 g/dL — ABNORMAL LOW (ref 13.0–17.0)
MCH: 33.5 pg (ref 26.0–34.0)
MCHC: 31.8 g/dL (ref 30.0–36.0)
MCV: 105.4 fL — ABNORMAL HIGH (ref 80.0–100.0)
Platelets: 171 10*3/uL (ref 150–400)
RBC: 3.73 MIL/uL — ABNORMAL LOW (ref 4.22–5.81)
RDW: 19.9 % — ABNORMAL HIGH (ref 11.5–15.5)
WBC: 14.5 10*3/uL — ABNORMAL HIGH (ref 4.0–10.5)
nRBC: 0 % (ref 0.0–0.2)

## 2022-02-15 LAB — RESP PANEL BY RT-PCR (RSV, FLU A&B, COVID)  RVPGX2
Influenza A by PCR: NEGATIVE
Influenza B by PCR: NEGATIVE
Resp Syncytial Virus by PCR: NEGATIVE
SARS Coronavirus 2 by RT PCR: NEGATIVE

## 2022-02-15 LAB — HEPATIC FUNCTION PANEL
ALT: 100 U/L — ABNORMAL HIGH (ref 0–44)
AST: 97 U/L — ABNORMAL HIGH (ref 15–41)
Albumin: 2.9 g/dL — ABNORMAL LOW (ref 3.5–5.0)
Alkaline Phosphatase: 358 U/L — ABNORMAL HIGH (ref 38–126)
Bilirubin, Direct: 0.3 mg/dL — ABNORMAL HIGH (ref 0.0–0.2)
Indirect Bilirubin: 0.9 mg/dL (ref 0.3–0.9)
Total Bilirubin: 1.2 mg/dL (ref 0.3–1.2)
Total Protein: 6.1 g/dL — ABNORMAL LOW (ref 6.5–8.1)

## 2022-02-15 LAB — BASIC METABOLIC PANEL
Anion gap: 9 (ref 5–15)
BUN: 20 mg/dL (ref 8–23)
CO2: 25 mmol/L (ref 22–32)
Calcium: 8 mg/dL — ABNORMAL LOW (ref 8.9–10.3)
Chloride: 102 mmol/L (ref 98–111)
Creatinine, Ser: 1.54 mg/dL — ABNORMAL HIGH (ref 0.61–1.24)
GFR, Estimated: 46 mL/min — ABNORMAL LOW (ref >60)
Glucose, Bld: 173 mg/dL — ABNORMAL HIGH (ref 70–99)
Potassium: 4.1 mmol/L (ref 3.5–5.1)
Sodium: 136 mmol/L (ref 135–145)

## 2022-02-15 LAB — LIPASE, BLOOD: Lipase: 46 U/L (ref 11–51)

## 2022-02-15 LAB — D-DIMER, QUANTITATIVE: D-Dimer, Quant: 3.04 ug/mL-FEU — ABNORMAL HIGH (ref 0.00–0.50)

## 2022-02-15 LAB — TROPONIN I (HIGH SENSITIVITY)
Troponin I (High Sensitivity): 39 ng/L — ABNORMAL HIGH (ref <18)
Troponin I (High Sensitivity): 39 ng/L — ABNORMAL HIGH (ref <18)

## 2022-02-15 MED ORDER — IOHEXOL 350 MG/ML SOLN
75.0000 mL | Freq: Once | INTRAVENOUS | Status: AC | PRN
  Administered 2022-02-15: 75 mL via INTRAVENOUS

## 2022-02-15 NOTE — ED Notes (Signed)
Pt placed on monitoring and primary RN made aware pt roomed

## 2022-02-15 NOTE — Telephone Encounter (Signed)
Per Jerene Pitch in scheduling - msg left on scheduling line. Daughter called and left a vm that her "father was hurting in his chest and doesn't know what to do." She is confused to know what to do . Does she need to rush him to the hospital. Msg left at 1159 am. Call back # [phone]  Msg above was sent to me at 1245 and received at 1300. RN called daughter back. Daughter - reports - that pt has experienced strong pressure in chest for several hours. Chest pain started since 4 am morning. Chest pain has relieved some but still present.  Pt has no other symptoms. No shortness of breath, nausea, vomiting, diarrhea no fevers. Per daughter, she checked pt's - bp was 146/94.  Daughter advised to call ems to evaluate pt's chest pain. He needs to go to the ER.

## 2022-02-15 NOTE — ED Provider Notes (Signed)
[facility] Provider Note    Event Date/Time   First MD Initiated Contact with Patient 02/15/22 [phone]     (approximate)   History   Chest Pain   HPI  Albert Giles is a 77 y.o. male who presents to the ER for evaluation of midsternal nonradiating chest pain and pressure that is present this morning when he was waking up.  He could not go back to sleep.  Symptoms lasted several hours.  They have since eased off.  States has been compliant with his medications.  Is not on blood thinners.  Does have history of CAD status post stent on aspirin and Plavix.  Denies any fevers or chills denies any shortness of breath.  Denies any pain at this moment.  Have a history of rectal cancer, currently on chemotherapy.     Physical Exam   Triage Vital Signs: ED Triage Vitals  Enc Vitals Group     BP 02/15/22 1411 (!) 144/88     Pulse Rate 02/15/22 1411 82     Resp 02/15/22 1411 18     Temp 02/15/22 1411 (!) 97.5 F (36.4 C)     Temp Source 02/15/22 1411 Oral     SpO2 02/15/22 1411 100 %     Weight 02/15/22 1412 152 lb 5.4 oz (69.1 kg)     Height 02/15/22 1412 '5\' 7"'$  (1.702 m)     Head Circumference --      Peak Flow --      Pain Score 02/15/22 1412 8     Pain Loc --      Pain Edu? --      Excl. in [place]? --     Most recent vital signs: Vitals:   02/15/22 1411 02/15/22 1530  BP: (!) 144/88 (!) 159/101  Pulse: 82 84  Resp: 18 (!) 22  Temp: (!) 97.5 F (36.4 C)   SpO2: 100% 100%     Constitutional: Alert  Eyes: Conjunctivae are normal.  Head: Atraumatic. Nose: No congestion/rhinnorhea. Mouth/Throat: Mucous membranes are moist.   Neck: Painless ROM.  Cardiovascular:   Good peripheral circulation. Respiratory: Normal respiratory effort.  No retractions.  Gastrointestinal: Soft and nontender.  Musculoskeletal:  no deformity Neurologic:  MAE spontaneously. No gross focal neurologic deficits are appreciated.  Skin:  Skin is warm, dry and intact. No rash  noted. Psychiatric: Mood and affect are normal. Speech and behavior are normal.    ED Results / Procedures / Treatments   Labs (all labs ordered are listed, but only abnormal results are displayed) Labs Reviewed  BASIC METABOLIC PANEL - Abnormal; Notable for the following components:      Result Value   Glucose, Bld 173 (*)    Creatinine, Ser 1.54 (*)    Calcium 8.0 (*)    GFR, Estimated 46 (*)    All other components within normal limits  CBC - Abnormal; Notable for the following components:   WBC 14.5 (*)    RBC 3.73 (*)    Hemoglobin 12.5 (*)    MCV 105.4 (*)    RDW 19.9 (*)    All other components within normal limits  D-DIMER, QUANTITATIVE - Abnormal; Notable for the following components:   D-Dimer, Quant 3.04 (*)    All other components within normal limits  HEPATIC FUNCTION PANEL - Abnormal; Notable for the following components:   Total Protein 6.1 (*)    Albumin 2.9 (*)    AST 97 (*)  ALT 100 (*)    Alkaline Phosphatase 358 (*)    Bilirubin, Direct 0.3 (*)    All other components within normal limits  TROPONIN I (HIGH SENSITIVITY) - Abnormal; Notable for the following components:   Troponin I (High Sensitivity) 39 (*)    All other components within normal limits  TROPONIN I (HIGH SENSITIVITY) - Abnormal; Notable for the following components:   Troponin I (High Sensitivity) 39 (*)    All other components within normal limits  RESP PANEL BY RT-PCR (RSV, FLU A&B, COVID)  RVPGX2  LIPASE, BLOOD     EKG  ED ECG REPORT I, Merlyn Lot, the attending physician, personally viewed and interpreted this ECG.   Date: 02/15/2022  EKG Time: 14:07  Rate: 79  Rhythm: sinus  Axis: normal  Intervals: normal  ST&T Change: no stemi, no depressions    RADIOLOGY Please see ED Course for my review and interpretation.  I personally reviewed all radiographic images ordered to evaluate for the above acute complaints and reviewed radiology reports and findings.   These findings were personally discussed with the patient.  Please see medical record for radiology report.    PROCEDURES:  Critical Care performed: No  Procedures   MEDICATIONS ORDERED IN ED: Medications  iohexol (OMNIPAQUE) 350 MG/ML injection 75 mL (75 mLs Intravenous Contrast Given 02/15/22 1645)     IMPRESSION / MDM / ASSESSMENT AND PLAN / ED COURSE  I reviewed the triage vital signs and the nursing notes.                              Differential diagnosis includes, but is not limited to, ACS, pericarditis, esophagitis, boerhaaves, pe, dissection, pna, bronchitis, costochondritis  Patient presenting to the ER for evaluation of symptoms as described above.  Based on symptoms, risk factors and considered above differential, this presenting complaint could reflect a potentially life-threatening illness therefore the patient will be placed on continuous pulse oximetry and telemetry for monitoring.  Laboratory evaluation will be sent to evaluate for the above complaints.     Clinical Course as of 02/15/22 1826  Fri Feb 15, 2022  1607 D-dimer is significantly elevated will order CTA to further evaluate. [PR]  1711 CTA without evidence of PE.  Will await formal radiology report [PR]  1720 Patient with new ascites on exam.  His abdominal exam is soft benign is not febrile.  Will order CT of the abdomen to further evaluate.  Normal LFTs and lipase. [PR]  1824 Patient discussed in consultation with Dr. Oleta Mouse of oncology regarding incidental finding of new ascites.  Repeat abdominal exams have remained benign.  He is not febrile.  He is asymptomatic.  Denies any chest pain.  His cardiac enzymes have been flat.  EKG nonischemic.  Not consistent with ACS.  Will be appropriate for outpatient follow-up.  Patient agreeable with plan. [PR]    Clinical Course User Index [PR] Merlyn Lot, MD     FINAL CLINICAL IMPRESSION(S) / ED DIAGNOSES   Final diagnoses:  Atypical chest pain      Rx / DC Orders   ED Discharge Orders     None        Note:  This document was prepared using Dragon voice recognition software and may include unintentional dictation errors.    Merlyn Lot, MD 02/15/22 [phone]

## 2022-02-15 NOTE — ED Triage Notes (Signed)
Pt here with cp that started earlier this morning. Pt states he called ems who took an EKG and vitals that were WNL and pt felt reassured. Pt cbg with ems was 247, pt not a diabetic per family. Pt states pain is centered and does not radiate but was stronger in nature so he came to ED for evaluation.

## 2022-02-18 ENCOUNTER — Other Ambulatory Visit: Payer: Self-pay | Admitting: *Deleted

## 2022-02-18 DIAGNOSIS — C20 Malignant neoplasm of rectum: Secondary | ICD-10-CM

## 2022-02-19 ENCOUNTER — Telehealth: Payer: Self-pay | Admitting: *Deleted

## 2022-02-19 ENCOUNTER — Inpatient Hospital Stay: Payer: Medicare HMO | Attending: Internal Medicine

## 2022-02-19 ENCOUNTER — Inpatient Hospital Stay (HOSPITAL_BASED_OUTPATIENT_CLINIC_OR_DEPARTMENT_OTHER): Payer: Medicare HMO | Admitting: Internal Medicine

## 2022-02-19 ENCOUNTER — Inpatient Hospital Stay: Payer: Medicare HMO | Admitting: Pharmacist

## 2022-02-19 ENCOUNTER — Encounter: Payer: Self-pay | Admitting: Internal Medicine

## 2022-02-19 VITALS — BP 127/92 | HR 79 | Temp 97.8°F | Resp 18 | Wt 160.6 lb

## 2022-02-19 DIAGNOSIS — C184 Malignant neoplasm of transverse colon: Secondary | ICD-10-CM | POA: Insufficient documentation

## 2022-02-19 DIAGNOSIS — C7800 Secondary malignant neoplasm of unspecified lung: Secondary | ICD-10-CM | POA: Diagnosis not present

## 2022-02-19 DIAGNOSIS — C786 Secondary malignant neoplasm of retroperitoneum and peritoneum: Secondary | ICD-10-CM | POA: Insufficient documentation

## 2022-02-19 DIAGNOSIS — E46 Unspecified protein-calorie malnutrition: Secondary | ICD-10-CM | POA: Insufficient documentation

## 2022-02-19 DIAGNOSIS — Z79899 Other long term (current) drug therapy: Secondary | ICD-10-CM | POA: Insufficient documentation

## 2022-02-19 DIAGNOSIS — K922 Gastrointestinal hemorrhage, unspecified: Secondary | ICD-10-CM | POA: Diagnosis not present

## 2022-02-19 DIAGNOSIS — C20 Malignant neoplasm of rectum: Secondary | ICD-10-CM

## 2022-02-19 DIAGNOSIS — D696 Thrombocytopenia, unspecified: Secondary | ICD-10-CM | POA: Diagnosis not present

## 2022-02-19 DIAGNOSIS — N183 Chronic kidney disease, stage 3 unspecified: Secondary | ICD-10-CM | POA: Diagnosis not present

## 2022-02-19 DIAGNOSIS — D509 Iron deficiency anemia, unspecified: Secondary | ICD-10-CM | POA: Diagnosis not present

## 2022-02-19 DIAGNOSIS — K529 Noninfective gastroenteritis and colitis, unspecified: Secondary | ICD-10-CM

## 2022-02-19 DIAGNOSIS — I129 Hypertensive chronic kidney disease with stage 1 through stage 4 chronic kidney disease, or unspecified chronic kidney disease: Secondary | ICD-10-CM | POA: Insufficient documentation

## 2022-02-19 LAB — CBC WITH DIFFERENTIAL/PLATELET
Abs Immature Granulocytes: 0.05 10*3/uL (ref 0.00–0.07)
Basophils Absolute: 0.2 10*3/uL — ABNORMAL HIGH (ref 0.0–0.1)
Basophils Relative: 2 %
Eosinophils Absolute: 0.1 10*3/uL (ref 0.0–0.5)
Eosinophils Relative: 1 %
HCT: 37.2 % — ABNORMAL LOW (ref 39.0–52.0)
Hemoglobin: 12 g/dL — ABNORMAL LOW (ref 13.0–17.0)
Immature Granulocytes: 0 %
Lymphocytes Relative: 12 %
Lymphs Abs: 1.5 10*3/uL (ref 0.7–4.0)
MCH: 33.9 pg (ref 26.0–34.0)
MCHC: 32.3 g/dL (ref 30.0–36.0)
MCV: 105.1 fL — ABNORMAL HIGH (ref 80.0–100.0)
Monocytes Absolute: 1.3 10*3/uL — ABNORMAL HIGH (ref 0.1–1.0)
Monocytes Relative: 11 %
Neutro Abs: 8.7 10*3/uL — ABNORMAL HIGH (ref 1.7–7.7)
Neutrophils Relative %: 74 %
Platelets: 163 10*3/uL (ref 150–400)
RBC: 3.54 MIL/uL — ABNORMAL LOW (ref 4.22–5.81)
RDW: 19.1 % — ABNORMAL HIGH (ref 11.5–15.5)
WBC: 11.9 10*3/uL — ABNORMAL HIGH (ref 4.0–10.5)
nRBC: 0 % (ref 0.0–0.2)

## 2022-02-19 LAB — COMPREHENSIVE METABOLIC PANEL
ALT: 75 U/L — ABNORMAL HIGH (ref 0–44)
AST: 73 U/L — ABNORMAL HIGH (ref 15–41)
Albumin: 2.6 g/dL — ABNORMAL LOW (ref 3.5–5.0)
Alkaline Phosphatase: 403 U/L — ABNORMAL HIGH (ref 38–126)
Anion gap: 8 (ref 5–15)
BUN: 22 mg/dL (ref 8–23)
CO2: 25 mmol/L (ref 22–32)
Calcium: 8.2 mg/dL — ABNORMAL LOW (ref 8.9–10.3)
Chloride: 103 mmol/L (ref 98–111)
Creatinine, Ser: 1.62 mg/dL — ABNORMAL HIGH (ref 0.61–1.24)
GFR, Estimated: 43 mL/min — ABNORMAL LOW (ref >60)
Glucose, Bld: 164 mg/dL — ABNORMAL HIGH (ref 70–99)
Potassium: 4.5 mmol/L (ref 3.5–5.1)
Sodium: 136 mmol/L (ref 135–145)
Total Bilirubin: 0.9 mg/dL (ref 0.3–1.2)
Total Protein: 5.3 g/dL — ABNORMAL LOW (ref 6.5–8.1)

## 2022-02-19 NOTE — Progress Notes (Signed)
Appetite is improving with 8 lb wt gain.

## 2022-02-19 NOTE — Telephone Encounter (Signed)
Did they mention if this has happened in the past?

## 2022-02-19 NOTE — Progress Notes (Signed)
[place]  [phone] [phone]  Patient Care Team: [name], MD as PCP - General (Family Medicine) Clent Jacks, RN as Oncology Nurse Navigator Cammie Sickle, MD as Consulting Physician (Oncology)   Name of the patient: Albert Giles  [id number]  01-Jul-1945   Date of visit: 02/19/22  HPI: Patient is a 77 y.o. male with metastatic rectal cancer. Patient may start [place] (regorafenib) in the future pending repeat PET in a month.   Reason for Consult: Regorafenib oral chemotherapy education.   PAST MEDICAL HISTORY: Past Medical History:  Diagnosis Date   CAD (coronary artery disease) 06/2014   UA with NSTEMI - cath with 99% prox L circ s/p stent, EF 40% ([place], [place] at [facility])   Emphysema    mild   Ex-smoker    Family history of breast cancer    Family history of colon cancer    Family history of pancreatic cancer    History of COVID-19    NSTEMI (non-ST elevated myocardial infarction) ([place]) 07/13/2014   Rectal cancer ([place])     HEMATOLOGY/ONCOLOGY HISTORY:  Oncology History Overview Note  # Malignant partially obstructing tumor in the transverse colon/70 cm proximal to the anus- C. COLON MASS, 70 CM; COLD BIOPSY:  - INTRAMUCOSAL ADENOCARCINOMA AT LEAST;  # One 20 mm polyp in the transverse colon, removed with mucosal resection. Resected and retrieved. Clips were placed. Tattooed.  # tumor in the mid rectum and at 10 cm proximal to the anus. Biopsied.    SEE COMMENT.   Comment:  There is no definitive evidence of invasion in this sample.  The  findings may not accurately represent the entire underlying lesion;  clinical correlation is recommended.   D. COLON POLYP, TRANSVERSE; HOT SNARE:  - TUBULOVILLOUS ADENOMA.  - NEGATIVE FOR HIGH GRADE DYSPLASIA AND MALIGNANCY.   E. RECTUM MASS; COLD BIOPSY:  - INVASIVE ADENOCARCINOMA, MODERATELY TO POORLY  DIFFERENTIATED. ----------------------------------   # PET scan: SEP 2022-proximal right colon [adjacent mesenteric lymph nodes] and rectal hypermetabolism.  Additional subcentimeter bilateral hypermetabolic lymphadenopathy noted in the retroperitoneal/left external iliac; inferior right lobe of the liver concerning for metastatic disease; right lower quadrant soft tissue mass concerning for peritoneal carcinomatosis; bilateral solid pulmonary nodules ~5 mm; MRI rectum- T stage: T4a; N stage:  N1.   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  s/p FOLFIRI + M-vasi #9. DISCONTINUED sec to poor tolerance.   # DEC 2023- Dr.Vanga- Flexible sigmoidoscopy with Dr. Marius Ditch.  No progressive rectal cancer noted; friable rectal mucosa      Rectal cancer ([place])  09/11/2020 Initial Diagnosis   Rectal cancer ([place])   09/25/2020 - 08/23/2021 Chemotherapy   Patient is on Treatment Plan : COLORECTAL FOLFOX q14d x 4 months     09/28/2020 Cancer Staging   Staging form: Colon and Rectum, AJCC 8th Edition - Clinical: Stage IVC (cT4a, cN1, cM1c) - Signed by Cammie Sickle, MD  on 09/28/2020    Genetic Testing   Negative genetic testing. No pathogenic variants identified on the [facility] Multi-Cancer+RNA Panel. The report date is 11/05/2020.  The Multi-Cancer Panel + RNA offered by [facility] includes  sequencing and/or deletion duplication testing of the following 84 genes: AIP, ALK, APC, ATM, AXIN2,BAP1,  BARD1, BLM, BMPR1A, BRCA1, BRCA2, BRIP1, CASR, CDC73, CDH1, CDK4, CDKN1B, CDKN1C, CDKN2A (p14ARF), CDKN2A (p16INK4a), CEBPA, CHEK2, CTNNA1, DICER1, DIS3L2, EGFR (c.2369C>T, p.Thr790Met variant only), EPCAM (Deletion/duplication testing only), FH, FLCN, GATA2, GPC3, GREM1 (Promoter region deletion/duplication testing only), HOXB13 (c.251G>A, p.Gly84Glu), HRAS, KIT, MAX, MEN1, MET, MITF (c.952G>A, p.Glu318Lys variant only), MLH1, MSH2, MSH3, MSH6, MUTYH, NBN, NF1, NF2, NTHL1, PALB2, PDGFRA, PHOX2B, PMS2, POLD1, POLE, POT1, PRKAR1A, PTCH1, PTEN, RAD50, RAD51C, RAD51D, RB1, RECQL4, RET, RUNX1, SDHAF2, SDHA (sequence changes only), SDHB, SDHC, SDHD, SMAD4, SMARCA4, SMARCB1, SMARCE1, STK11, SUFU, TERC, TERT, TMEM127, TP53, TSC1, TSC2, VHL, WRN and WT1.   09/04/2021 -  Chemotherapy   Patient is on Treatment Plan : COLORECTAL FOLFIRI + Bevacizumab q14d       ALLERGIES:  has No Known Allergies.  MEDICATIONS:  Current Outpatient Medications  Medication Sig Dispense Refill   aspirin EC 81 MG tablet Take 1 tablet (81 mg total) by mouth daily. 60 tablet 1   atorvastatin (LIPITOR) 40 MG tablet Take 40 mg by mouth daily.     Cholecalciferol (VITAMIN D3) 25 MCG (1000 UT) CAPS Take 1 capsule (1,000 Units total) by mouth daily. 30 capsule    Cyanocobalamin (B-12) 1000 MCG SUBL Place 1 tablet under the tongue daily.     diphenoxylate-atropine (LOMOTIL) 2.5-0.025 MG tablet Take 1 tablet by mouth 4 (four) times daily as needed for diarrhea or loose stools. Take it along with immodium 60 tablet 1   docusate sodium (COLACE) 100 MG capsule Take 100 mg by mouth 2 (two) times daily as needed.     ferrous sulfate 324 (65 Fe) MG TBEC Take 1 tablet (325 mg total) by mouth every other day.     lidocaine-prilocaine (EMLA) cream Apply on the port. 30 -45 min  prior to port access. 30 g 3   magic mouthwash (nystatin,  lidocaine, diphenhydrAMINE, alum & mag hydroxide) suspension Swish and spit 5 mLs 4 (four) times daily as needed for mouth pain. 240 mL 1   metoprolol tartrate (LOPRESSOR) 25 MG tablet Take 1 tablet (25 mg total) by mouth 2 (two) times daily. (Patient taking differently: Take 25 mg by mouth daily at 12 noon.) 60 tablet 1   mirtazapine (REMERON) 7.5 MG tablet Take 1 tablet (7.5 mg total) by mouth at bedtime. 30 tablet 2   pantoprazole (PROTONIX) 40 MG tablet TAKE 1 TABLET BY MOUTH TWICE DAILY BEFORE A MEAL 60 tablet 11   polyethylene glycol (MIRALAX / GLYCOLAX) 17 g packet Take 17 g by mouth daily. (Patient not taking: Reported on 02/01/2022)     regorafenib (STIVARGA) 40 MG tablet Take 2 tablets (80 mg total) by mouth daily with breakfast. Take with low fat meal. Take for 21 days, then hold for 7 days. Repeat every 28 days. (Patient not taking: Reported on 02/19/2022) 42 tablet 0   triamcinolone ointment (KENALOG) 0.5 % Apply 1 Application topically 2 (two) times daily. (Patient not taking: Reported on 02/04/2022) 30 g 0   No current facility-administered medications for this visit.   Facility-Administered Medications Ordered in Other Visits  Medication Dose Route Frequency Provider Last Rate Last Admin   sodium chloride flush (NS) 0.9 % injection 10 mL  10 mL Intravenous PRN Cammie Sickle, MD   10 mL at 10/09/20 0855    VITAL SIGNS: There were no vitals taken for this visit. There were no vitals filed for this visit.  Estimated body mass index is 25.15 kg/m as calculated from the following:   Height as of 02/15/22: '5\' 7"'$  (1.702 m).   Weight as of an earlier encounter on 02/19/22: 72.8 kg (160 lb 9.6 oz).  LABS: CBC:    Component Value Date/Time   WBC 11.9 (H) 02/19/2022 0845   HGB 12.0 (L) 02/19/2022 0845   HGB 11.5 (L) 01/21/2022 1507   HCT 37.2 (L) 02/19/2022 0845   HCT 34.7 (L) 01/21/2022 1507   PLT 163 02/19/2022 0845   PLT 113 (L) 01/21/2022 1507   MCV 105.1 (H) 02/19/2022  0845   MCV 101 (H) 01/21/2022 1507   NEUTROABS 8.7 (H) 02/19/2022 0845   LYMPHSABS 1.5 02/19/2022 0845   MONOABS 1.3 (H) 02/19/2022 0845   EOSABS 0.1 02/19/2022 0845   BASOSABS 0.2 (H) 02/19/2022 0845   Comprehensive Metabolic Panel:    Component Value Date/Time   NA 136 02/19/2022 0845   NA 141 01/21/2022 1507   K 4.5 02/19/2022 0845   CL 103 02/19/2022 0845   CO2 25 02/19/2022 0845   BUN 22 02/19/2022 0845   BUN 16 01/21/2022 1507   CREATININE 1.62 (H) 02/19/2022 0845   GLUCOSE 164 (H) 02/19/2022 0845   CALCIUM 8.2 (L) 02/19/2022 0845   AST 73 (H) 02/19/2022 0845   ALT 75 (H) 02/19/2022 0845   ALKPHOS 403 (H) 02/19/2022 0845   BILITOT 0.9 02/19/2022 0845   PROT 5.3 (L) 02/19/2022 0845   ALBUMIN 2.6 (L) 02/19/2022 0845     Present during today's visit: patient and his daughter  Start plan: Per MD start is on hold and will be determined based on. Re-education will be provided closer to start date once determined   Patient Education I spoke with patient for overview of new oral chemotherapy medication: regorafenib   Administration: Counseled patient on administration, dosing, side effects, monitoring, drug-food interactions, safe handling, storage, and disposal. Patient will take 2 tablets (80 mg total) by mouth daily with breakfast. Take with low fat meal. Take for 21 days, then hold for 7 days. Repeat every 28 days.   Side Effects: Side effects include but not limited to: fatigue, hand-foot syndrome, decreased wbc/hgb/plt, diarrhea, mouth sores, hypertension, rash.    Drug-drug Interactions (DDI): Atorvastatin: Regorafenib may increase the serum concentration of atorvastatin. Monitor for increased effects/toxicities of atorvastatin. No baseline dose adjustment needed.  Adherence: After discussion with patient no patient barriers to medication adherence identified.  Reviewed with patient importance of keeping a medication schedule and plan for any missed doses.  Mr.  Cratty voiced understanding and appreciation. All questions answered. Medication handout provided.  Provided patient with [facility] phone number. Patient knows to call the office with questions or concerns. [facility] will continue to follow.  Patient expressed understanding and was in agreement with this plan. He also understands that He can call clinic at any time with any questions, concerns, or complaints.   Medication Access Issues: Patient has medication in hand, but knows not to start until given the go ahead by Dr. Rogue Bussing.   Follow-up plan: RTC in ~4 weeks  Thank you for allowing me to participate in the care of this patient.   Time Total: 20 mins  Visit consisted of counseling and  education on dealing with issues of symptom management in the setting of serious and potentially life-threatening illness.Greater than 50%  of this time was spent counseling and coordinating care related to the above assessment and plan.  Signed by: Darl Pikes, PharmD, BCPS, Salley Slaughter, CPP Hematology/Oncology Clinical Pharmacist Practitioner [place]/DB/AP [facility] [phone]  02/19/2022 2:15 PM

## 2022-02-19 NOTE — Telephone Encounter (Signed)
Patient daughter called reporing that after patient got home from our office, his left ankle was swollen, It was not like that this morning. She is asking if he needs to come back to be evaluated for it. His shoe is fitting very tight on that foot. Please advise

## 2022-02-19 NOTE — Assessment & Plan Note (Addendum)
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 AUG 2023- Currently on FOLFIRI+ m-Vasi.   DEC 8th, 2023- Response to therapy of pulmonary metastasis; No new or progressive disease. MOST recently on s/p FOLFIRI + M-vasi #9.  CTA [ER]- FEB 2nd, 2024-  No evidence for pulmonary embolism; Stable cardiomegaly with trace pericardial effusion; Stable mild interstitial prominence in the lung bases; Stable pulmonary nodules measuring 5 mm or less.  No new nodules.Moderate ascites in the upper abdomen. FEB 2nd, 2024- CT AP- non-contrast- Moderate abdominal and pelvic ascites; Prominent lymph node at the abdominal hiatus is developing since prior study and is indeterminate for possible metastasis. No definitive solid organ metastatic lesions, although solid organ visualization is technically limited due to contrast phase. No evidence of bowel obstruction.  # Will plan to discontinue FOLFIRI + M-vasi #10-given the poor tolerance; and especially scan February 15, 2022 shows no obvious progression of disease.  However given the rising LFTs/alkaline phosphatase-recommend evaluation with a PET scan.   # plan to start pt on Regarafenib. 80 mg/ day x cycle #1- and the increase to 120 mg cycle #2; and then if tolerating well increase to 160 mg with #3.  Patient will take 3 weeks on 1 week off.  Also discussed the potential side effects including but not limited to worsening fatigue diarrhea hand-foot syndrome. But HOLD starting for now given poor nutrition.   # Ascites likely secondary to-third spacing rather than progressive disease.  Monitor for now; if worse would recommend paracentesis/studies- will check if we can move nutrition sooner than 2/13. Will monitor for now; if worse would recommend US abdomen.   # Chest pain [JAN 7001]- currently resolved; CTA-NEG.   [Dr.Callwood]- awaiting evaluation.   # Nausea/vomiting/diarrhea- improved/ resolved- continue to hold starting regorafenib. UGTA-1 genetic testing-pending.   # Intermittent rectal bleeding- [DEC, 2023]- flexible sigmoidoscopy with Dr. Marius Ditch.  No progressive rectal cancer noted; friable rectal mucosa thought because of patient's bleeding-okay to restart M-vasi. Stable.  # Anemia/iron deficiency hemoglobin ~12  secondary to chronic GI bleeding/tumor-slightly worse; see above. NOV Iron sat- 17; ferritin- 100.   # Thrombocytopenia/secondary chemotherapy/on aspirin-platelets- 173[HOLD para <80]- -stable.   # Rising LFTs- normal bili-recommend evaluation with PET scan in about 4 weeks.   # HTN- 150s/94; at home BP reviewed- 110-130-/ DBP80-90s.continue checking BP at home/reviewed the log from home.  Proceed with M-vasi today.   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 discontinued oxaliplatin for now. Poor tolerance to cymbalta 30 mg q day. Stable.    # Chronic kidney disease stage III-[GFR-43  Para >2 ] - Continue keeping up with hydration-worsened recommend IV fluids antiemetics.Stable.    * CEA- not marker;  # DISPOSITION:  # follow up in 4 weeks- MD; labs- - cbc/cmp;PET scan prior- Dr.B
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
## 2022-02-19 NOTE — Telephone Encounter (Signed)
No, only that it was not like that this morning

## 2022-02-20 ENCOUNTER — Inpatient Hospital Stay: Payer: Medicare HMO

## 2022-02-20 NOTE — Progress Notes (Signed)
Nutrition Follow-up:   Patient with colon cancer.  Chemo has been held due to poor tolerance.  Planning to start oral chemotherapy, regarafenib.    Spoke with daughter Albert Giles for phone follow-up visit.  Dtr reports that appetite is better.  Usually eats cereal and banana for breakfast.  Lunch yesterday was cheeseburger, nothing else.  Dinner last night was baked beans, mashed potatoes and sauerkraut and weenies. Likes ensure, yogurt, celery, and jello with fruit for snacks.  Dtr reports that diarrhea is better controlled and started taking colace     Medications: reviewed  Labs: reviewed  Anthropometrics:   Weight 160 lb 9.6 oz 152 lb 4.8 oz on 1/23 159 lb 6.4 oz on 12/26 161 lb on 11/28 171 lb on 9/22   NUTRITION DIAGNOSIS: Inadequate oral intake improved    INTERVENTION:  Encouraged good source of protein at every meal/snack Encouraged 350 calorie shake or higher Daughter has seen handout on diarrhea foods to choose. Can liberalize diet when not having diarrhea.   Contact information provided to daughter and she will contact RD if something changes in appetite    NEXT VISIT: no follow-up Dtr will contact RD if needed  Mattie Novosel B. Zenia Resides, [place], [place] Registered Dietitian [phone]

## 2022-02-22 ENCOUNTER — Encounter: Payer: Self-pay | Admitting: Internal Medicine

## 2022-02-22 NOTE — Telephone Encounter (Signed)
Spoke with patient to follow up with Edema episode this week. Per patient states the swelling has gone down. Patient denies any pain associated. Advised patient to call us if the edema returns, causes pain and or if he has any further questions or concerns. Patient verbalized understanding.

## 2022-02-25 ENCOUNTER — Ambulatory Visit
Admission: RE | Admit: 2022-02-25 | Discharge: 2022-02-25 | Disposition: A | Discharge status: Home / Self Care | Payer: Medicare HMO | Source: Ambulatory Visit | Attending: Medical Oncology | Admitting: Medical Oncology

## 2022-02-25 ENCOUNTER — Telehealth: Payer: Self-pay | Admitting: *Deleted

## 2022-02-25 ENCOUNTER — Encounter: Payer: Self-pay | Admitting: Medical Oncology

## 2022-02-25 ENCOUNTER — Inpatient Hospital Stay (HOSPITAL_BASED_OUTPATIENT_CLINIC_OR_DEPARTMENT_OTHER): Payer: Medicare HMO | Admitting: Medical Oncology

## 2022-02-25 VITALS — BP 130/90 | HR 78 | Temp 96.2°F | Resp 16 | Ht 67.0 in | Wt 167.0 lb

## 2022-02-25 DIAGNOSIS — D696 Thrombocytopenia, unspecified: Secondary | ICD-10-CM | POA: Diagnosis not present

## 2022-02-25 DIAGNOSIS — C184 Malignant neoplasm of transverse colon: Secondary | ICD-10-CM | POA: Diagnosis not present

## 2022-02-25 DIAGNOSIS — D509 Iron deficiency anemia, unspecified: Secondary | ICD-10-CM | POA: Diagnosis not present

## 2022-02-25 DIAGNOSIS — R600 Localized edema: Secondary | ICD-10-CM | POA: Diagnosis not present

## 2022-02-25 DIAGNOSIS — C7800 Secondary malignant neoplasm of unspecified lung: Secondary | ICD-10-CM | POA: Diagnosis not present

## 2022-02-25 DIAGNOSIS — N183 Chronic kidney disease, stage 3 unspecified: Secondary | ICD-10-CM | POA: Diagnosis not present

## 2022-02-25 DIAGNOSIS — C20 Malignant neoplasm of rectum: Secondary | ICD-10-CM

## 2022-02-25 DIAGNOSIS — Z79899 Other long term (current) drug therapy: Secondary | ICD-10-CM | POA: Diagnosis not present

## 2022-02-25 DIAGNOSIS — I129 Hypertensive chronic kidney disease with stage 1 through stage 4 chronic kidney disease, or unspecified chronic kidney disease: Secondary | ICD-10-CM | POA: Diagnosis not present

## 2022-02-25 DIAGNOSIS — R609 Edema, unspecified: Secondary | ICD-10-CM | POA: Insufficient documentation

## 2022-02-25 DIAGNOSIS — C786 Secondary malignant neoplasm of retroperitoneum and peritoneum: Secondary | ICD-10-CM | POA: Diagnosis not present

## 2022-02-25 DIAGNOSIS — K922 Gastrointestinal hemorrhage, unspecified: Secondary | ICD-10-CM | POA: Diagnosis not present

## 2022-02-25 DIAGNOSIS — E46 Unspecified protein-calorie malnutrition: Secondary | ICD-10-CM | POA: Diagnosis not present

## 2022-02-25 NOTE — Telephone Encounter (Signed)
Daughter Lenna Sciara called this morning reporting that the swelling in patient left ankle never went all the way down though it did intially improve is now worse than it was originally. She is asking if patient needs to be evaluated for this. Please advise

## 2022-02-25 NOTE — Progress Notes (Unsigned)
Having ankle swelling mainly in left ankle. Noticed it starting last week. Does not have any pain. Denies chest pain or SOB.

## 2022-02-25 NOTE — Progress Notes (Unsigned)
Symptom Management [place] at [facility] [phone] [phone]  Patient Care Team: [name], MD as PCP - General (Family Medicine) Clent Jacks, RN as Oncology Nurse Navigator Cammie Sickle, MD as Consulting Physician (Oncology)   Name of the patient: Albert Giles  ML:[id number]  02-09-1945   Oncological History: Stage IV rectal cancer  Treatment:  FOLFIRI + M-vasi (ended after cycle 9 on 01/22/2022 due to GI side effects) Regarafenib 80 mg/day x cycle 1- 120 mg cycle 2- 160 mg cycle 3- 3 weeks on 1 week off  Date of visit: 02/25/22  Reason for Consult: Albert Giles is a 77 y.o. male who presents today for:  Presents with his wife. They report that since the evening of 02/19/2022 he has had swelling of his left lower leg. No known injury. Comes and goes a bit depending on activity level but always present. Swelling goes up to his knee. No fever, knee pain, rash, chest pain or SOB. They have not tried anything for symptoms. He is not on a blood thinner. No history of DVT.   Denies any neurologic complaints. Denies recent fevers or illnesses. Denies any easy bleeding or bruising. Reports good appetite and denies weight loss. Denies chest pain. Denies any nausea, vomiting, constipation, or diarrhea. Denies urinary complaints. Patient offers no further specific complaints today.    PAST MEDICAL HISTORY: Past Medical History:  Diagnosis Date   CAD (coronary artery disease) 06/2014   UA with NSTEMI - cath with 99% prox L circ s/p stent, EF 40% ([place], [place] at [facility])   Emphysema    mild   Ex-smoker    Family history of breast cancer    Family history of colon cancer    Family history of pancreatic cancer    History of COVID-19    NSTEMI (non-ST elevated myocardial infarction) ([place]) 07/13/2014   Rectal cancer ([place])     PAST SURGICAL HISTORY:  Past Surgical History:  Procedure Laterality Date   CARDIAC  CATHETERIZATION N/A 07/14/2014   Left Heart Cath and Coronary Angiography with stent placement;  Surgeon: Teodoro Spray, MD   CARDIAC CATHETERIZATION N/A 07/14/2014   Coronary Stent Intervention;  Surgeon: Yolonda Kida, MD   COLONOSCOPY WITH PROPOFOL N/A 09/06/2020   Procedure: COLONOSCOPY WITH PROPOFOL;  Surgeon: Lin Landsman, MD;  Location: [facility] ENDOSCOPY;  Service: Gastroenterology;  Laterality: N/A;   CYSTECTOMY     on back   ESOPHAGOGASTRODUODENOSCOPY N/A 09/06/2020   Procedure: ESOPHAGOGASTRODUODENOSCOPY (EGD);  Surgeon: Lin Landsman, MD;  Location: [facility] ENDOSCOPY;  Service: Gastroenterology;  Laterality: N/A;   ESOPHAGOGASTRODUODENOSCOPY N/A 06/29/2021   Procedure: ESOPHAGOGASTRODUODENOSCOPY (EGD);  Surgeon: Lin Landsman, MD;  Location: [facility] ENDOSCOPY;  Service: Gastroenterology;  Laterality: N/A;   FLEXIBLE SIGMOIDOSCOPY N/A 12/20/2021   Procedure: FLEXIBLE SIGMOIDOSCOPY;  Surgeon: Lin Landsman, MD;  Location: [facility] ENDOSCOPY;  Service: Gastroenterology;  Laterality: N/A;   INGUINAL HERNIA REPAIR Right 08/17/04   IR IMAGING GUIDED PORT INSERTION  09/13/2020    HEMATOLOGY/ONCOLOGY HISTORY:  Oncology History Overview Note  # Malignant partially obstructing tumor in the transverse colon/70 cm proximal to the anus- C. COLON MASS, 70 CM; COLD BIOPSY:  - INTRAMUCOSAL ADENOCARCINOMA AT LEAST;  # One 20 mm polyp in the transverse colon, removed with mucosal resection. Resected and retrieved. Clips were placed. Tattooed.  # tumor in the mid rectum and at 10 cm proximal to the anus. Biopsied.    SEE COMMENT.  Comment:  There is no definitive evidence of invasion in this sample.  The  findings may not accurately represent the entire underlying lesion;  clinical correlation is recommended.   D. COLON POLYP, TRANSVERSE; HOT SNARE:  - TUBULOVILLOUS ADENOMA.  - NEGATIVE FOR HIGH GRADE DYSPLASIA AND MALIGNANCY.   E. RECTUM MASS; COLD BIOPSY:  - INVASIVE  ADENOCARCINOMA, MODERATELY TO POORLY DIFFERENTIATED. ----------------------------------   # PET scan: SEP 2022-proximal right colon [adjacent mesenteric lymph nodes] and rectal hypermetabolism.  Additional subcentimeter bilateral hypermetabolic lymphadenopathy noted in the retroperitoneal/left external iliac; inferior right lobe of the liver concerning for metastatic disease; right lower quadrant soft tissue mass concerning for peritoneal carcinomatosis; bilateral solid pulmonary nodules ~5 mm; MRI rectum- T stage: T4a; N stage:  N1.   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  s/p FOLFIRI + M-vasi #9. DISCONTINUED sec to poor tolerance.   # DEC 2023- Dr.Vanga- Flexible sigmoidoscopy with Dr. Marius Ditch.  No progressive rectal cancer noted; friable rectal mucosa      Rectal cancer ([place])  09/11/2020 Initial Diagnosis   Rectal cancer ([place])   09/25/2020 - 08/23/2021 Chemotherapy   Patient is on Treatment Plan : COLORECTAL FOLFOX q14d x 4 months     09/28/2020 Cancer Staging   Staging form: Colon and Rectum, AJCC 8th Edition - Clinical: Stage IVC (cT4a, cN1, cM1c) - Signed by Cammie Sickle, MD on 09/28/2020    Genetic Testing   Negative genetic testing. No pathogenic variants identified on the [facility] Multi-Cancer+RNA Panel. The report date is 11/05/2020.  The Multi-Cancer Panel +  RNA offered by [facility] includes sequencing and/or deletion duplication testing of the following 84 genes: AIP, ALK, APC, ATM, AXIN2,BAP1,  BARD1, BLM, BMPR1A, BRCA1, BRCA2, BRIP1, CASR, CDC73, CDH1, CDK4, CDKN1B, CDKN1C, CDKN2A (p14ARF), CDKN2A (p16INK4a), CEBPA, CHEK2, CTNNA1, DICER1, DIS3L2, EGFR (c.2369C>T, p.Thr790Met variant only), EPCAM (Deletion/duplication testing only), FH, FLCN, GATA2, GPC3, GREM1 (Promoter region deletion/duplication testing only), HOXB13 (c.251G>A, p.Gly84Glu), HRAS, KIT, MAX, MEN1, MET, MITF (c.952G>A, p.Glu318Lys variant only), MLH1, MSH2, MSH3, MSH6, MUTYH, NBN, NF1, NF2, NTHL1, PALB2, PDGFRA, PHOX2B, PMS2, POLD1, POLE, POT1, PRKAR1A, PTCH1, PTEN, RAD50, RAD51C, RAD51D, RB1, RECQL4, RET, RUNX1, SDHAF2, SDHA (sequence changes only), SDHB, SDHC, SDHD, SMAD4, SMARCA4, SMARCB1, SMARCE1, STK11, SUFU, TERC, TERT, TMEM127, TP53, TSC1, TSC2, VHL, WRN and WT1.   09/04/2021 -  Chemotherapy   Patient is on Treatment Plan : COLORECTAL FOLFIRI + Bevacizumab q14d       ALLERGIES:  has No Known Allergies.  MEDICATIONS:  Current Outpatient Medications  Medication Sig Dispense Refill   aspirin EC 81 MG tablet Take 1 tablet (81 mg total) by mouth daily. 60 tablet 1   Cholecalciferol (VITAMIN D3) 25 MCG (1000 UT) CAPS Take 1 capsule (1,000 Units total) by mouth daily. 30 capsule    Cyanocobalamin (B-12) 1000 MCG SUBL Place 1 tablet under the tongue daily.     diphenoxylate-atropine (LOMOTIL) 2.5-0.025 MG tablet Take 1 tablet by mouth 4 (four) times  daily as needed for diarrhea or loose stools. Take it along with immodium 60 tablet 1   docusate sodium (COLACE) 100 MG capsule Take 100 mg by mouth 2 (two) times daily as needed.     lidocaine-prilocaine (EMLA) cream Apply on the port. 30 -45 min  prior to port access. 30 g 3   magic mouthwash (nystatin, lidocaine, diphenhydrAMINE, alum & mag hydroxide) suspension Swish and spit 5 mLs 4 (four) times daily as needed for mouth pain. 240 mL 1    metoprolol tartrate (LOPRESSOR) 25 MG tablet Take 1 tablet (25 mg total) by mouth 2 (two) times daily. (Patient taking differently: Take 25 mg by mouth daily at 12 noon.) 60 tablet 1   mirtazapine (REMERON) 7.5 MG tablet Take 1 tablet (7.5 mg total) by mouth at bedtime. 30 tablet 2   pantoprazole (PROTONIX) 40 MG tablet TAKE 1 TABLET BY MOUTH TWICE DAILY BEFORE A MEAL 60 tablet 11   atorvastatin (LIPITOR) 40 MG tablet Take 40 mg by mouth daily. (Patient not taking: Reported on 02/25/2022)     ferrous sulfate 324 (65 Fe) MG TBEC Take 1 tablet (325 mg total) by mouth every other day.     polyethylene glycol (MIRALAX / GLYCOLAX) 17 g packet Take 17 g by mouth daily. (Patient not taking: Reported on 02/01/2022)     regorafenib (STIVARGA) 40 MG tablet Take 2 tablets (80 mg total) by mouth daily with breakfast. Take with low fat meal. Take for 21 days, then hold for 7 days. Repeat every 28 days. (Patient not taking: Reported on 02/19/2022) 42 tablet 0   triamcinolone ointment (KENALOG) 0.5 % Apply 1 Application topically 2 (two) times daily. (Patient not taking: Reported on 02/04/2022) 30 g 0   No current facility-administered medications for this visit.   Facility-Administered Medications Ordered in Other Visits  Medication Dose Route Frequency Provider Last Rate Last Admin   sodium chloride flush (NS) 0.9 % injection 10 mL  10 mL Intravenous PRN Cammie Sickle, MD   10 mL at 10/09/20 0855    VITAL SIGNS: BP (!) 130/90 (BP Location: Left Arm, Patient Position: Sitting, Cuff Size: Normal)   Pulse 78   Temp (!) 96.2 F (35.7 C) (Tympanic)   Resp 16   Ht 5' 7"$  (1.702 m)   Wt 167 lb (75.8 kg)   SpO2 99%   BMI 26.16 kg/m  Filed Weights   02/25/22 1447  Weight: 167 lb (75.8 kg)    Estimated body mass index is 26.16 kg/m as calculated from the following:   Height as of this encounter: 5' 7"$  (1.702 m).   Weight as of this encounter: 167 lb (75.8 kg).  LABS: CBC:    Component Value  Date/Time   WBC 11.9 (H) 02/19/2022 0845   HGB 12.0 (L) 02/19/2022 0845   HGB 11.5 (L) 01/21/2022 1507   HCT 37.2 (L) 02/19/2022 0845   HCT 34.7 (L) 01/21/2022 1507   PLT 163 02/19/2022 0845   PLT 113 (L) 01/21/2022 1507   MCV 105.1 (H) 02/19/2022 0845   MCV 101 (H) 01/21/2022 1507   NEUTROABS 8.7 (H) 02/19/2022 0845   LYMPHSABS 1.5 02/19/2022 0845   MONOABS 1.3 (H) 02/19/2022 0845   EOSABS 0.1 02/19/2022 0845   BASOSABS 0.2 (H) 02/19/2022 0845   Comprehensive Metabolic Panel:    Component Value Date/Time   NA 136 02/19/2022 0845   NA 141 01/21/2022 1507   K 4.5 02/19/2022 0845   CL 103 02/19/2022 0845  CO2 25 02/19/2022 0845   BUN 22 02/19/2022 0845   BUN 16 01/21/2022 1507   CREATININE 1.62 (H) 02/19/2022 0845   GLUCOSE 164 (H) 02/19/2022 0845   CALCIUM 8.2 (L) 02/19/2022 0845   AST 73 (H) 02/19/2022 0845   ALT 75 (H) 02/19/2022 0845   ALKPHOS 403 (H) 02/19/2022 0845   BILITOT 0.9 02/19/2022 0845   PROT 5.3 (L) 02/19/2022 0845   ALBUMIN 2.6 (L) 02/19/2022 0845    RADIOGRAPHIC STUDIES:  PERFORMANCE STATUS (ECOG) : 1 - Symptomatic but completely ambulatory  Review of Systems Unless otherwise noted, a complete review of systems is negative.  Physical Exam General: NAD Cardiovascular: regular rate and rhythm. DP pulse intact bilaterally  Pulmonary: clear ant fields Abdomen: soft, nontender, + bowel sounds GU: no suprapubic tenderness Extremities: no joint deformities, no warmth of knee or ankle. 2+ pitting edema of the left distal lower leg extending from knee. Negative Homan sign.  Skin: no erythema, discharge or concerns for infection. No pallor or dusky hue of the legs.  Neurological: Weakness but otherwise nonfocal   Assessment and Plan- Patient is a 77 y.o. male    Encounter Diagnoses  Name Primary?   Edema, unspecified type Yes   Rectal cancer ([place])     New. Unilateral. Appears venous in nature. He is at high risk for DVT. STAT US of the left lower  extremity to rule out DVT. If negative could be secondary to lymphadenopathy or disease burden of pelvis/abdomen. Further imaging can be considered. Discussed with patient and his wife.    Patient expressed understanding and was in agreement with this plan. He also understands that He can call clinic at any time with any questions, concerns, or complaints.   Thank you for allowing me to participate in the care of this very pleasant patient.   Time Total: 25  Visit consisted of counseling and education dealing with the complex and emotionally intense issues of symptom management in the setting of serious illness.Greater than 50%  of this time was spent counseling and coordinating care related to the above assessment and plan.  Signed by: Nelwyn Salisbury, PA-C

## 2022-02-26 ENCOUNTER — Telehealth: Payer: Self-pay

## 2022-02-26 ENCOUNTER — Inpatient Hospital Stay: Payer: Medicare HMO

## 2022-02-26 LAB — MISC LABCORP TEST (SEND OUT): Labcorp test code: 511200

## 2022-02-26 NOTE — Telephone Encounter (Signed)
-----   Message from Hughie Closs, [place] sent at 02/26/2022  9:11 AM EST ----- Can you call him to let him know that his ultrasound did not show a blood clot. This is good news. The swelling is likely from his cancer. I would suggest he use a compression knee high sock, elevate the leg and try to walk a bit more if possible. I would suggest he keep his follow up imaging PET scan that is scheduled for later this month. If symptoms worsen please alert our office. He will need to keep his previously scheduled visits with our clinic.

## 2022-02-26 NOTE — Telephone Encounter (Signed)
Called to inform patient with results of [place] per Nelwyn Salisbury. Patient did not answer and VM is not set up yet. Will try again to reach patient.

## 2022-02-28 ENCOUNTER — Encounter: Payer: Self-pay | Admitting: Internal Medicine

## 2022-02-28 ENCOUNTER — Telehealth: Payer: Self-pay | Admitting: Medical Oncology

## 2022-02-28 NOTE — Telephone Encounter (Signed)
Called to ensure he has received [place] results- no answer. No voicemail available

## 2022-03-12 ENCOUNTER — Telehealth: Payer: Self-pay | Admitting: Internal Medicine

## 2022-03-12 NOTE — Telephone Encounter (Signed)
Called to inform patient of cancelled PET appointment  per [facility]- patient understood and I reminded him we will see him on 3/5.

## 2022-03-14 ENCOUNTER — Ambulatory Visit: Payer: Medicare HMO

## 2022-03-16 MED ORDER — CEFAZOLIN SODIUM-DEXTROSE 2-4 GM/100ML-% IV SOLN
INTRAVENOUS | Status: AC
  Filled 2022-03-16: qty 100

## 2022-03-16 MED ORDER — TRANEXAMIC ACID-NACL 1000-0.7 MG/100ML-% IV SOLN
INTRAVENOUS | Status: AC
  Filled 2022-03-16: qty 100

## 2022-03-19 ENCOUNTER — Inpatient Hospital Stay: Payer: Medicare HMO | Attending: Internal Medicine

## 2022-03-19 ENCOUNTER — Inpatient Hospital Stay (HOSPITAL_BASED_OUTPATIENT_CLINIC_OR_DEPARTMENT_OTHER): Payer: Medicare HMO | Admitting: Internal Medicine

## 2022-03-19 ENCOUNTER — Encounter: Payer: Self-pay | Admitting: Internal Medicine

## 2022-03-19 ENCOUNTER — Inpatient Hospital Stay: Payer: Medicare HMO | Admitting: Pharmacist

## 2022-03-19 VITALS — BP 140/91 | HR 69 | Temp 97.2°F | Resp 16 | Wt 168.1 lb

## 2022-03-19 DIAGNOSIS — Z7982 Long term (current) use of aspirin: Secondary | ICD-10-CM | POA: Diagnosis not present

## 2022-03-19 DIAGNOSIS — R0609 Other forms of dyspnea: Secondary | ICD-10-CM | POA: Diagnosis not present

## 2022-03-19 DIAGNOSIS — Z79899 Other long term (current) drug therapy: Secondary | ICD-10-CM | POA: Diagnosis not present

## 2022-03-19 DIAGNOSIS — C7800 Secondary malignant neoplasm of unspecified lung: Secondary | ICD-10-CM | POA: Diagnosis not present

## 2022-03-19 DIAGNOSIS — I129 Hypertensive chronic kidney disease with stage 1 through stage 4 chronic kidney disease, or unspecified chronic kidney disease: Secondary | ICD-10-CM | POA: Insufficient documentation

## 2022-03-19 DIAGNOSIS — N183 Chronic kidney disease, stage 3 unspecified: Secondary | ICD-10-CM | POA: Insufficient documentation

## 2022-03-19 DIAGNOSIS — T451X5A Adverse effect of antineoplastic and immunosuppressive drugs, initial encounter: Secondary | ICD-10-CM | POA: Diagnosis not present

## 2022-03-19 DIAGNOSIS — D6959 Other secondary thrombocytopenia: Secondary | ICD-10-CM | POA: Insufficient documentation

## 2022-03-19 DIAGNOSIS — D509 Iron deficiency anemia, unspecified: Secondary | ICD-10-CM | POA: Insufficient documentation

## 2022-03-19 DIAGNOSIS — Z87891 Personal history of nicotine dependence: Secondary | ICD-10-CM | POA: Insufficient documentation

## 2022-03-19 DIAGNOSIS — C787 Secondary malignant neoplasm of liver and intrahepatic bile duct: Secondary | ICD-10-CM | POA: Insufficient documentation

## 2022-03-19 DIAGNOSIS — C786 Secondary malignant neoplasm of retroperitoneum and peritoneum: Secondary | ICD-10-CM | POA: Diagnosis not present

## 2022-03-19 DIAGNOSIS — C20 Malignant neoplasm of rectum: Secondary | ICD-10-CM

## 2022-03-19 LAB — CBC WITH DIFFERENTIAL/PLATELET
Abs Immature Granulocytes: 0.02 10*3/uL (ref 0.00–0.07)
Basophils Absolute: 0.1 10*3/uL (ref 0.0–0.1)
Basophils Relative: 1 %
Eosinophils Absolute: 0.3 10*3/uL (ref 0.0–0.5)
Eosinophils Relative: 4 %
HCT: 36.4 % — ABNORMAL LOW (ref 39.0–52.0)
Hemoglobin: 11.5 g/dL — ABNORMAL LOW (ref 13.0–17.0)
Immature Granulocytes: 0 %
Lymphocytes Relative: 21 %
Lymphs Abs: 1.5 10*3/uL (ref 0.7–4.0)
MCH: 33.1 pg (ref 26.0–34.0)
MCHC: 31.6 g/dL (ref 30.0–36.0)
MCV: 104.9 fL — ABNORMAL HIGH (ref 80.0–100.0)
Monocytes Absolute: 1.1 10*3/uL — ABNORMAL HIGH (ref 0.1–1.0)
Monocytes Relative: 14 %
Neutro Abs: 4.5 10*3/uL (ref 1.7–7.7)
Neutrophils Relative %: 60 %
Platelets: 103 10*3/uL — ABNORMAL LOW (ref 150–400)
RBC: 3.47 MIL/uL — ABNORMAL LOW (ref 4.22–5.81)
RDW: 17.7 % — ABNORMAL HIGH (ref 11.5–15.5)
WBC: 7.5 10*3/uL (ref 4.0–10.5)
nRBC: 0 % (ref 0.0–0.2)

## 2022-03-19 LAB — COMPREHENSIVE METABOLIC PANEL
ALT: 31 U/L (ref 0–44)
AST: 37 U/L (ref 15–41)
Albumin: 2.7 g/dL — ABNORMAL LOW (ref 3.5–5.0)
Alkaline Phosphatase: 392 U/L — ABNORMAL HIGH (ref 38–126)
Anion gap: 6 (ref 5–15)
BUN: 15 mg/dL (ref 8–23)
CO2: 24 mmol/L (ref 22–32)
Calcium: 8.3 mg/dL — ABNORMAL LOW (ref 8.9–10.3)
Chloride: 105 mmol/L (ref 98–111)
Creatinine, Ser: 1.43 mg/dL — ABNORMAL HIGH (ref 0.61–1.24)
GFR, Estimated: 50 mL/min — ABNORMAL LOW (ref >60)
Glucose, Bld: 95 mg/dL (ref 70–99)
Potassium: 4.9 mmol/L (ref 3.5–5.1)
Sodium: 135 mmol/L (ref 135–145)
Total Bilirubin: 1 mg/dL (ref 0.3–1.2)
Total Protein: 5.8 g/dL — ABNORMAL LOW (ref 6.5–8.1)

## 2022-03-19 NOTE — Progress Notes (Signed)
Patient not seen, [place] start on hold

## 2022-03-19 NOTE — Progress Notes (Signed)
Still having bilateral lower extremity swelling L>R.  Bowels will alternate between diarrhea and constipation.  140/1
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
## 2022-03-19 NOTE — Assessment & Plan Note (Addendum)
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 AUG 2023- Currently on FOLFIRI+ m-Vasi.   DEC 8th, 2023- Response to therapy of pulmonary metastasis; No new or progressive disease. MOST recently on s/p FOLFIRI + M-vasi #9.  CTA [ER]- FEB 2nd, 2024-  No evidence for pulmonary embolism; Stable cardiomegaly with trace pericardial effusion; Stable mild interstitial prominence in the lung bases; Stable pulmonary nodules measuring 5 mm or less.  No new nodules.Moderate ascites in the upper abdomen. FEB 2nd, 2024- CT AP- non-contrast- Moderate abdominal and pelvic ascites; Prominent lymph node at the abdominal hiatus is developing since prior study and is indeterminate for possible metastasis. No definitive solid organ metastatic lesions, although solid organ visualization is technically limited due to contrast phase. No evidence of bowel obstruction. 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  However given the rising LFTs/alkaline phosphatase-awaiting PET scan on 3/12.   # plan to start pt on Regarafenib. 80 mg/ day x cycle #1- and the increase to 120 mg cycle #2; and then if tolerating well increase to 160 mg with #3.  Patient will take 3 weeks on 1 week off.  Also discussed the potential side effects including but not limited to worsening fatigue diarrhea hand-foot syndrome. But HOLD starting for now until PET scan/waiting to improve leg swelling/functional status.  Discussed with pharmacy.  # Bil LE swelling- sec to venous stasis/ third spacing- recommend stocking. Will order echo ASAP- [place].  Discussed with Dr. Telford Nab team.  # Chest pain [JAN 2024]-  currently resolved; CTA-NEG.  [Dr.[place]wood]- awaiting evaluation. Echo- ordered today.   # Intermittent rectal bleeding- [DEC, 2023]- flexible sigmoidoscopy with Dr. Marius Ditch.  No progressive rectal cancer noted; friable rectal mucosa thought because of patient's bleeding-okay to restart M-vasi. Stable.   # Anemia/iron deficiency hemoglobin ~11-12  secondary to chronic GI bleeding/tumor-slightly worse; see above. NOV Iron sat- 17; ferritin- 100. stable  # Thrombocytopenia/secondary chemotherapy/on aspirin-platelets- 103[HOLD para <80]- -stable.   # Rising LFTs- normal bili-recommend evaluation with PET scan in about 1 weeks stable.   # HTN- 150s/94; at home BP reviewed- 110-130-/ DBP80-90s.continue checking BP at home/reviewed the log from home.  Stable  # PN G-1-2 sec to Oxaliplatin [last 04/02/2021]. discontinued oxaliplatin for now. Poor tolerance to cymbalta 30 mg q day. Stable.    # Chronic kidney disease stage III-[GFR-43 ] - Continue keeping up with hydration-worsened recommend IV fluids antiemetics.Stable.    * CEA- not marker;  3/12-PET  # DISPOSITION:  # echo asap # follow up on 3/14-MD; No labs-  Dr.B

## 2022-03-26 ENCOUNTER — Ambulatory Visit: Payer: Medicare HMO

## 2022-03-27 ENCOUNTER — Ambulatory Visit
Admission: RE | Admit: 2022-03-27 | Discharge: 2022-03-27 | Disposition: A | Discharge status: Home / Self Care | Payer: Medicare HMO | Source: Ambulatory Visit | Attending: Internal Medicine | Admitting: Internal Medicine

## 2022-03-27 DIAGNOSIS — I2510 Atherosclerotic heart disease of native coronary artery without angina pectoris: Secondary | ICD-10-CM | POA: Insufficient documentation

## 2022-03-27 DIAGNOSIS — K449 Diaphragmatic hernia without obstruction or gangrene: Secondary | ICD-10-CM | POA: Diagnosis not present

## 2022-03-27 DIAGNOSIS — C7800 Secondary malignant neoplasm of unspecified lung: Secondary | ICD-10-CM | POA: Diagnosis not present

## 2022-03-27 DIAGNOSIS — I700 Atherosclerosis of aorta: Secondary | ICD-10-CM | POA: Insufficient documentation

## 2022-03-27 DIAGNOSIS — R7989 Other specified abnormal findings of blood chemistry: Secondary | ICD-10-CM | POA: Diagnosis not present

## 2022-03-27 DIAGNOSIS — C20 Malignant neoplasm of rectum: Secondary | ICD-10-CM

## 2022-03-27 DIAGNOSIS — J90 Pleural effusion, not elsewhere classified: Secondary | ICD-10-CM | POA: Diagnosis not present

## 2022-03-27 DIAGNOSIS — R918 Other nonspecific abnormal finding of lung field: Secondary | ICD-10-CM | POA: Diagnosis not present

## 2022-03-27 LAB — GLUCOSE, CAPILLARY: Glucose-Capillary: 95 mg/dL (ref 70–99)

## 2022-03-27 MED ORDER — FLUDEOXYGLUCOSE F - 18 (FDG) INJECTION
9.3100 | Freq: Once | INTRAVENOUS | Status: AC | PRN
  Administered 2022-03-27: 9.31 via INTRAVENOUS

## 2022-03-28 ENCOUNTER — Inpatient Hospital Stay: Payer: Medicare HMO | Admitting: Internal Medicine

## 2022-04-01 ENCOUNTER — Inpatient Hospital Stay (HOSPITAL_BASED_OUTPATIENT_CLINIC_OR_DEPARTMENT_OTHER): Payer: Medicare HMO | Admitting: Internal Medicine

## 2022-04-01 VITALS — BP 147/90 | HR 79 | Temp 97.0°F | Resp 18 | Wt 165.0 lb

## 2022-04-01 DIAGNOSIS — N183 Chronic kidney disease, stage 3 unspecified: Secondary | ICD-10-CM | POA: Diagnosis not present

## 2022-04-01 DIAGNOSIS — D6959 Other secondary thrombocytopenia: Secondary | ICD-10-CM | POA: Diagnosis not present

## 2022-04-01 DIAGNOSIS — I129 Hypertensive chronic kidney disease with stage 1 through stage 4 chronic kidney disease, or unspecified chronic kidney disease: Secondary | ICD-10-CM | POA: Diagnosis not present

## 2022-04-01 DIAGNOSIS — Z79899 Other long term (current) drug therapy: Secondary | ICD-10-CM | POA: Diagnosis not present

## 2022-04-01 DIAGNOSIS — C20 Malignant neoplasm of rectum: Secondary | ICD-10-CM

## 2022-04-01 DIAGNOSIS — C786 Secondary malignant neoplasm of retroperitoneum and peritoneum: Secondary | ICD-10-CM | POA: Diagnosis not present

## 2022-04-01 DIAGNOSIS — T451X5A Adverse effect of antineoplastic and immunosuppressive drugs, initial encounter: Secondary | ICD-10-CM | POA: Diagnosis not present

## 2022-04-01 DIAGNOSIS — C787 Secondary malignant neoplasm of liver and intrahepatic bile duct: Secondary | ICD-10-CM | POA: Diagnosis not present

## 2022-04-01 DIAGNOSIS — Z7982 Long term (current) use of aspirin: Secondary | ICD-10-CM | POA: Diagnosis not present

## 2022-04-01 DIAGNOSIS — C7800 Secondary malignant neoplasm of unspecified lung: Secondary | ICD-10-CM | POA: Diagnosis not present

## 2022-04-01 DIAGNOSIS — D509 Iron deficiency anemia, unspecified: Secondary | ICD-10-CM | POA: Diagnosis not present

## 2022-04-01 DIAGNOSIS — Z87891 Personal history of nicotine dependence: Secondary | ICD-10-CM | POA: Diagnosis not present

## 2022-04-01 MED ORDER — FUROSEMIDE 20 MG PO TABS: 20.0000 mg | ORAL_TABLET | Freq: Every day | ORAL | 0 refills | Status: DC

## 2022-04-01 NOTE — Progress Notes (Signed)
Echo done at cardiology. Edema might be some better in LE's. Denies pain. Appetite is good. Energy is fair. Constipation is a problem. Taking colace, instructed to add miralax daily.
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
## 2022-04-01 NOTE — Assessment & Plan Note (Addendum)
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 AUG 2023- Currently on FOLFIRI+ m-Vasi.   DEC 8th, 2023- Response to therapy of pulmonary metastasis; No new or progressive disease. MOST recently on s/p FOLFIRI + M-vasi #9.  March 13th, 2024- PET scan: No evidence of hepatic metastasis or explanation for elevated liver function tests Pulmonary metastasis, progressive since 02/15/2022; Persistent small mildly hypermetabolic mediastinal node, favored to be reactive;Resolved rectosigmoid hypermetabolism. Improved cecal hypermetabolism, likely a site of primary.  Discontinue FOLFIRI + M-vasi-second intolerance.  # plan to start pt on Regarafenib. 80 mg/ day x cycle #1- and the increase to 120 mg cycle #2; and then if tolerating well increase to 160 mg with #3.  Patient will take 3 weeks on 1 week off.  Also discussed the potential side effects including but not limited to worsening fatigue diarrhea hand-foot syndrome.   Discussed with pharmacy.  # Bil LE swelling/ ? Fluid over load- recommend stocking. 3/05 echo ASAP- [facility]-pending results.  Discussed with Dr. Bess Harvest [3/27]; start pt on lasix 20 mg a day.  Monitor renal function closely.  # Intermittent rectal bleeding- [DEC, 2023]- flexible sigmoidoscopy with Dr. Marius Ditch.  No progressive rectal cancer noted- Stable.   # Anemia/iron deficiency hemoglobin ~11-12  secondary to chronic GI bleeding/tumor-slightly worse; see above. NOV Iron sat- 17; ferritin- 100. Stable.   # Thrombocytopenia/secondary chemotherapy/on aspirin-platelets- 103[HOLD para <80]- -stable.   # Rising LFTs- normal bili-? Etiology- PET scan- negative for liver cancer; if worse consider MRI liver.   # HTN- 150s/94; at home BP reviewed- 110-130-/ DBP80-90s.continue checking BP at home/reviewed the log from home.  Stable.   # PN G-1-2 sec  to Oxaliplatin [last 04/02/2021]. discontinued oxaliplatin for now. Poor tolerance to cymbalta 30 mg q day. Stable.   # Chronic kidney disease stage III-[GFR-43 ] - stable; monitor closely on Lasix.   * CEA- not marker;  # DISPOSITION:  # follow up in 2-3 weeks- MD; labs-cbc/cmp;BNP- Urine protein creatinine ratio-   Dr.B  # I reviewed the blood work- with the patient in detail; also reviewed the imaging independently [as summarized above]; and with the patient in detail.

## 2022-04-10 DIAGNOSIS — I2510 Atherosclerotic heart disease of native coronary artery without angina pectoris: Secondary | ICD-10-CM | POA: Diagnosis not present

## 2022-04-10 DIAGNOSIS — I5020 Unspecified systolic (congestive) heart failure: Secondary | ICD-10-CM | POA: Diagnosis not present

## 2022-04-10 DIAGNOSIS — K219 Gastro-esophageal reflux disease without esophagitis: Secondary | ICD-10-CM | POA: Diagnosis not present

## 2022-04-10 DIAGNOSIS — E785 Hyperlipidemia, unspecified: Secondary | ICD-10-CM | POA: Diagnosis not present

## 2022-04-10 DIAGNOSIS — N183 Chronic kidney disease, stage 3 unspecified: Secondary | ICD-10-CM | POA: Diagnosis not present

## 2022-04-10 DIAGNOSIS — C20 Malignant neoplasm of rectum: Secondary | ICD-10-CM | POA: Diagnosis not present

## 2022-04-10 DIAGNOSIS — Z9861 Coronary angioplasty status: Secondary | ICD-10-CM | POA: Diagnosis not present

## 2022-04-10 DIAGNOSIS — I429 Cardiomyopathy, unspecified: Secondary | ICD-10-CM | POA: Diagnosis not present

## 2022-04-11 ENCOUNTER — Encounter: Payer: Self-pay | Admitting: Internal Medicine

## 2022-04-12 ENCOUNTER — Ambulatory Visit: Payer: Medicare HMO

## 2022-04-16 ENCOUNTER — Inpatient Hospital Stay (HOSPITAL_BASED_OUTPATIENT_CLINIC_OR_DEPARTMENT_OTHER): Payer: Medicare HMO | Admitting: Internal Medicine

## 2022-04-16 ENCOUNTER — Other Ambulatory Visit (HOSPITAL_COMMUNITY): Payer: Self-pay

## 2022-04-16 ENCOUNTER — Other Ambulatory Visit: Payer: Self-pay | Admitting: Pharmacist

## 2022-04-16 ENCOUNTER — Other Ambulatory Visit: Payer: Self-pay

## 2022-04-16 ENCOUNTER — Inpatient Hospital Stay: Payer: Medicare HMO | Attending: Internal Medicine

## 2022-04-16 VITALS — BP 121/83 | HR 76 | Temp 97.0°F | Resp 18 | Wt 140.0 lb

## 2022-04-16 DIAGNOSIS — D696 Thrombocytopenia, unspecified: Secondary | ICD-10-CM | POA: Insufficient documentation

## 2022-04-16 DIAGNOSIS — I2510 Atherosclerotic heart disease of native coronary artery without angina pectoris: Secondary | ICD-10-CM | POA: Diagnosis not present

## 2022-04-16 DIAGNOSIS — R600 Localized edema: Secondary | ICD-10-CM | POA: Diagnosis not present

## 2022-04-16 DIAGNOSIS — C20 Malignant neoplasm of rectum: Secondary | ICD-10-CM | POA: Diagnosis not present

## 2022-04-16 DIAGNOSIS — N183 Chronic kidney disease, stage 3 unspecified: Secondary | ICD-10-CM | POA: Insufficient documentation

## 2022-04-16 DIAGNOSIS — Z7982 Long term (current) use of aspirin: Secondary | ICD-10-CM | POA: Diagnosis not present

## 2022-04-16 DIAGNOSIS — C7800 Secondary malignant neoplasm of unspecified lung: Secondary | ICD-10-CM | POA: Diagnosis not present

## 2022-04-16 DIAGNOSIS — I129 Hypertensive chronic kidney disease with stage 1 through stage 4 chronic kidney disease, or unspecified chronic kidney disease: Secondary | ICD-10-CM | POA: Insufficient documentation

## 2022-04-16 DIAGNOSIS — Z79899 Other long term (current) drug therapy: Secondary | ICD-10-CM | POA: Insufficient documentation

## 2022-04-16 DIAGNOSIS — D509 Iron deficiency anemia, unspecified: Secondary | ICD-10-CM | POA: Diagnosis not present

## 2022-04-16 DIAGNOSIS — C787 Secondary malignant neoplasm of liver and intrahepatic bile duct: Secondary | ICD-10-CM | POA: Diagnosis not present

## 2022-04-16 LAB — CBC WITH DIFFERENTIAL (CANCER CENTER ONLY)
Abs Immature Granulocytes: 0.02 10*3/uL (ref 0.00–0.07)
Basophils Absolute: 0.1 10*3/uL (ref 0.0–0.1)
Basophils Relative: 1 %
Eosinophils Absolute: 0.1 10*3/uL (ref 0.0–0.5)
Eosinophils Relative: 1 %
HCT: 44.8 % (ref 39.0–52.0)
Hemoglobin: 14.5 g/dL (ref 13.0–17.0)
Immature Granulocytes: 0 %
Lymphocytes Relative: 19 %
Lymphs Abs: 1.9 10*3/uL (ref 0.7–4.0)
MCH: 32.8 pg (ref 26.0–34.0)
MCHC: 32.4 g/dL (ref 30.0–36.0)
MCV: 101.4 fL — ABNORMAL HIGH (ref 80.0–100.0)
Monocytes Absolute: 0.7 10*3/uL (ref 0.1–1.0)
Monocytes Relative: 7 %
Neutro Abs: 7.3 10*3/uL (ref 1.7–7.7)
Neutrophils Relative %: 72 %
Platelet Count: 130 10*3/uL — ABNORMAL LOW (ref 150–400)
RBC: 4.42 MIL/uL (ref 4.22–5.81)
RDW: 16 % — ABNORMAL HIGH (ref 11.5–15.5)
WBC Count: 10.1 10*3/uL (ref 4.0–10.5)
nRBC: 0 % (ref 0.0–0.2)

## 2022-04-16 LAB — CMP (CANCER CENTER ONLY)
ALT: 41 U/L (ref 0–44)
AST: 47 U/L — ABNORMAL HIGH (ref 15–41)
Albumin: 3 g/dL — ABNORMAL LOW (ref 3.5–5.0)
Alkaline Phosphatase: 337 U/L — ABNORMAL HIGH (ref 38–126)
Anion gap: 7 (ref 5–15)
BUN: 24 mg/dL — ABNORMAL HIGH (ref 8–23)
CO2: 25 mmol/L (ref 22–32)
Calcium: 8.4 mg/dL — ABNORMAL LOW (ref 8.9–10.3)
Chloride: 103 mmol/L (ref 98–111)
Creatinine: 1.67 mg/dL — ABNORMAL HIGH (ref 0.61–1.24)
GFR, Estimated: 42 mL/min — ABNORMAL LOW (ref >60)
Glucose, Bld: 121 mg/dL — ABNORMAL HIGH (ref 70–99)
Potassium: 4 mmol/L (ref 3.5–5.1)
Sodium: 135 mmol/L (ref 135–145)
Total Bilirubin: 2.4 mg/dL — ABNORMAL HIGH (ref 0.3–1.2)
Total Protein: 6.4 g/dL — ABNORMAL LOW (ref 6.5–8.1)

## 2022-04-16 LAB — BRAIN NATRIURETIC PEPTIDE: B Natriuretic Peptide: 1011.8 pg/mL — ABNORMAL HIGH (ref 0.0–100.0)

## 2022-04-16 LAB — PROTEIN / CREATININE RATIO, URINE
Creatinine, Urine: 102 mg/dL
Protein Creatinine Ratio: 0.18 mg/mg{Cre} — ABNORMAL HIGH (ref 0.00–0.15)
Total Protein, Urine: 18 mg/dL

## 2022-04-16 MED ORDER — REGORAFENIB 40 MG PO TABS
120.0000 mg | ORAL_TABLET | Freq: Every day | ORAL | 0 refills | Status: DC
Start: 2022-04-16 — End: 2022-05-16
  Filled 2022-04-16: qty 63, 21d supply, fill #0
  Filled 2022-04-26: qty 63, 28d supply, fill #0
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
## 2022-04-16 NOTE — Patient Instructions (Signed)
#   Take Lasix twice a week.

## 2022-04-16 NOTE — Assessment & Plan Note (Addendum)
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 AUG 2023- Currently on FOLFIRI+ m-Vasi.   DEC 8th, 2023- Response to therapy of pulmonary metastasis; No new or progressive disease. MOST recently on s/p FOLFIRI + M-vasi #9.  March 13th, 2024- PET scan: No evidence of hepatic metastasis or explanation for elevated liver function tests Pulmonary metastasis, progressive since 02/15/2022; Persistent small mildly hypermetabolic mediastinal node, favored to be reactive;Resolved rectosigmoid hypermetabolism. Improved cecal hypermetabolism, likely a site of primary.  Discontinue FOLFIRI + M-vasi-second intolerance.  # patient currently on Regarafenib. 80 mg/ day x cycle #1 [finish on 4/07]- cycle #1- and the increase to 120 mg cycle #2 [start 4/15]; and then if tolerating well increase to 160 mg with #3.  Patient will take 3 weeks on 1 week off.  Discussed with Alysson.   # Bil LE swelling/ ? Fluid over load- recommend stocking. 3/05 echo ASAP- [facility]-pending results.  Currently on  lasix 20 mg a day; recommend twice a week-  given improvement in fluid status. Monitor renal function closely.  # Intermittent rectal bleeding- [DEC, 2023]- flexible sigmoidoscopy with Dr. Marius Ditch.  No progressive rectal cancer noted- Stable.   # Anemia/iron deficiency hemoglobin ~11-12  secondary to chronic GI bleeding/tumor-slightly worse; see above. NOV Iron sat- 17; ferritin- 100. Stable.   # Thrombocytopenia/secondary chemotherapy/on aspirin-platelets- 103[HOLD para <80]- -stable.   # Rising LFTs- normal bili-? Etiology- PET scan- negative for liver cancer; if worse consider MRI liver.   # HTN- 150s/94; at home BP reviewed- 110-130-/ DBP80-90s.continue checking BP at home/reviewed the log from home.  Stable.   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 discontinued oxaliplatin  for now. Poor tolerance to cymbalta 30 mg q day. ?  Worse.  Discussed regarding evaluation with neurology.  Patient declined.  # Chronic kidney disease stage III-[GFR-43 ] - stable; monitor closely on Lasix.   * CEA- not marker;  # DISPOSITION:  # follow up in 4 weeks- MD; labs-cbc/cmp; BNP- Urine protein creatinine ratio-  Dr.B

## 2022-04-16 NOTE — Progress Notes (Signed)
Weight loss tremendous. Down to 140 lbs. Denies nausea. Eats 2 meals day with a snack. Denies nausea. Back and forth between diarrhea and constipation. Gets constipated takes a laxative and then has diarrhea. Denies lightheadedness or dizziness. Only pain he has is redness on bottom from diarrhea. Tried desitin and wipes after using bathroom.Sleeps if not having to pee all night on fluid pills.

## 2022-04-17 ENCOUNTER — Other Ambulatory Visit: Payer: Medicare HMO

## 2022-04-19 ENCOUNTER — Other Ambulatory Visit: Payer: Self-pay

## 2022-04-22 ENCOUNTER — Other Ambulatory Visit (HOSPITAL_COMMUNITY): Payer: Self-pay

## 2022-04-24 DIAGNOSIS — I428 Other cardiomyopathies: Secondary | ICD-10-CM | POA: Diagnosis not present

## 2022-04-24 DIAGNOSIS — I429 Cardiomyopathy, unspecified: Secondary | ICD-10-CM | POA: Diagnosis not present

## 2022-04-26 ENCOUNTER — Other Ambulatory Visit (HOSPITAL_COMMUNITY): Payer: Self-pay

## 2022-04-26 ENCOUNTER — Other Ambulatory Visit: Payer: Self-pay

## 2022-05-14 ENCOUNTER — Encounter: Payer: Self-pay | Admitting: Internal Medicine

## 2022-05-14 ENCOUNTER — Inpatient Hospital Stay (HOSPITAL_BASED_OUTPATIENT_CLINIC_OR_DEPARTMENT_OTHER): Payer: Medicare HMO | Admitting: Internal Medicine

## 2022-05-14 ENCOUNTER — Inpatient Hospital Stay: Payer: Medicare HMO

## 2022-05-14 DIAGNOSIS — C20 Malignant neoplasm of rectum: Secondary | ICD-10-CM

## 2022-05-14 DIAGNOSIS — R600 Localized edema: Secondary | ICD-10-CM | POA: Diagnosis not present

## 2022-05-14 DIAGNOSIS — Z7982 Long term (current) use of aspirin: Secondary | ICD-10-CM | POA: Diagnosis not present

## 2022-05-14 DIAGNOSIS — C787 Secondary malignant neoplasm of liver and intrahepatic bile duct: Secondary | ICD-10-CM | POA: Diagnosis not present

## 2022-05-14 DIAGNOSIS — I129 Hypertensive chronic kidney disease with stage 1 through stage 4 chronic kidney disease, or unspecified chronic kidney disease: Secondary | ICD-10-CM | POA: Diagnosis not present

## 2022-05-14 DIAGNOSIS — D509 Iron deficiency anemia, unspecified: Secondary | ICD-10-CM | POA: Diagnosis not present

## 2022-05-14 DIAGNOSIS — C7800 Secondary malignant neoplasm of unspecified lung: Secondary | ICD-10-CM | POA: Diagnosis not present

## 2022-05-14 DIAGNOSIS — I2510 Atherosclerotic heart disease of native coronary artery without angina pectoris: Secondary | ICD-10-CM | POA: Diagnosis not present

## 2022-05-14 DIAGNOSIS — D696 Thrombocytopenia, unspecified: Secondary | ICD-10-CM | POA: Diagnosis not present

## 2022-05-14 DIAGNOSIS — Z79899 Other long term (current) drug therapy: Secondary | ICD-10-CM | POA: Diagnosis not present

## 2022-05-14 DIAGNOSIS — N183 Chronic kidney disease, stage 3 unspecified: Secondary | ICD-10-CM | POA: Diagnosis not present

## 2022-05-14 LAB — CBC WITH DIFFERENTIAL (CANCER CENTER ONLY)
Abs Immature Granulocytes: 0.02 10*3/uL (ref 0.00–0.07)
Basophils Absolute: 0.1 10*3/uL (ref 0.0–0.1)
Basophils Relative: 1 %
Eosinophils Absolute: 0.3 10*3/uL (ref 0.0–0.5)
Eosinophils Relative: 4 %
HCT: 42.9 % (ref 39.0–52.0)
Hemoglobin: 14.2 g/dL (ref 13.0–17.0)
Immature Granulocytes: 0 %
Lymphocytes Relative: 25 %
Lymphs Abs: 1.8 10*3/uL (ref 0.7–4.0)
MCH: 33.3 pg (ref 26.0–34.0)
MCHC: 33.1 g/dL (ref 30.0–36.0)
MCV: 100.7 fL — ABNORMAL HIGH (ref 80.0–100.0)
Monocytes Absolute: 0.6 10*3/uL (ref 0.1–1.0)
Monocytes Relative: 8 %
Neutro Abs: 4.7 10*3/uL (ref 1.7–7.7)
Neutrophils Relative %: 62 %
Platelet Count: 78 10*3/uL — ABNORMAL LOW (ref 150–400)
RBC: 4.26 MIL/uL (ref 4.22–5.81)
RDW: 18.4 % — ABNORMAL HIGH (ref 11.5–15.5)
WBC Count: 7.4 10*3/uL (ref 4.0–10.5)
nRBC: 0 % (ref 0.0–0.2)

## 2022-05-14 LAB — CMP (CANCER CENTER ONLY)
ALT: 51 U/L — ABNORMAL HIGH (ref 0–44)
AST: 56 U/L — ABNORMAL HIGH (ref 15–41)
Albumin: 2.6 g/dL — ABNORMAL LOW (ref 3.5–5.0)
Alkaline Phosphatase: 255 U/L — ABNORMAL HIGH (ref 38–126)
Anion gap: 6 (ref 5–15)
BUN: 22 mg/dL (ref 8–23)
CO2: 23 mmol/L (ref 22–32)
Calcium: 8 mg/dL — ABNORMAL LOW (ref 8.9–10.3)
Chloride: 104 mmol/L (ref 98–111)
Creatinine: 1.6 mg/dL — ABNORMAL HIGH (ref 0.61–1.24)
GFR, Estimated: 44 mL/min — ABNORMAL LOW (ref >60)
Glucose, Bld: 110 mg/dL — ABNORMAL HIGH (ref 70–99)
Potassium: 4.5 mmol/L (ref 3.5–5.1)
Sodium: 133 mmol/L — ABNORMAL LOW (ref 135–145)
Total Bilirubin: 2.4 mg/dL — ABNORMAL HIGH (ref 0.3–1.2)
Total Protein: 5.9 g/dL — ABNORMAL LOW (ref 6.5–8.1)

## 2022-05-14 LAB — PROTEIN / CREATININE RATIO, URINE
Creatinine, Urine: 67 mg/dL
Protein Creatinine Ratio: 0.16 mg/mg{Cre} — ABNORMAL HIGH (ref 0.00–0.15)
Total Protein, Urine: 11 mg/dL

## 2022-05-14 LAB — BRAIN NATRIURETIC PEPTIDE: B Natriuretic Peptide: 735.2 pg/mL — ABNORMAL HIGH (ref 0.0–100.0)
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
## 2022-05-14 NOTE — Assessment & Plan Note (Addendum)
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 AUG 2023- Currently on FOLFIRI+ m-Vasi.   DEC 8th, 2023- Response to therapy of pulmonary metastasis; No new or progressive disease. MOST recently on s/p FOLFIRI + M-vasi #9.  March 13th, 2024- PET scan: No evidence of hepatic metastasis or explanation for elevated liver function tests Pulmonary metastasis, progressive since 02/15/2022; Persistent small mildly hypermetabolic mediastinal node, favored to be reactive;Resolved rectosigmoid hypermetabolism. Improved cecal hypermetabolism, likely a site of primary.  Discontinue FOLFIRI + M-vasi-second intolerance.  # patient currently on Regarafenib. currently ramped up to  120 mg with #3. Continue current dose. Patient will take 3 weeks on 1 week off.  Discussed with Alysson. Will repeat scans 2 months; will order at next visit.   # Bil LE swelling/ ? Fluid over load- recommend stocking. April, 2 D echo - 40%  Currently on  lasix 20 mg a day; recommend twice a week-  given improvement in fluid status. Monitor renal function closely.  # Intermittent rectal bleeding- [DEC, 2023]- flexible sigmoidoscopy with Dr. Allegra Lai.  No progressive rectal cancer noted- Stable.   # Anemia/iron deficiency hemoglobin ~11-12  secondary to chronic GI bleeding/tumor-slightly worse; see above. NOV Iron sat- 17; ferritin- 100. Stable.   # Thrombocytopenia/secondary chemotherapy/on aspirin-platelets- 70s- monitor for now.    # Rising LFTs- normal bili-? Etiology- PET scan- negative for liver cancer; if worse consider MRI liver.   # HTN- 150s/94; at home BP reviewed- continue checking BP at home/reviewed the log from home.  Stable.   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 discontinued oxaliplatin for now. Poor tolerance to cymbalta 30 mg q day. ?  Worse.  Stable.   #  Chronic kidney disease stage III-[GFR-43 ] - stable; monitor closely on Lasix.   * CEA- not marker;  # DISPOSITION:  # follow up in 4 weeks- MD; labs-cbc/cmp; BNP- Urine protein creatinine ratio-  Dr.B

## 2022-05-14 NOTE — Progress Notes (Signed)
No concerns today 

## 2022-05-16 ENCOUNTER — Other Ambulatory Visit (HOSPITAL_COMMUNITY): Payer: Self-pay

## 2022-05-16 ENCOUNTER — Other Ambulatory Visit: Payer: Self-pay

## 2022-05-16 ENCOUNTER — Other Ambulatory Visit: Payer: Self-pay | Admitting: Internal Medicine

## 2022-05-16 DIAGNOSIS — C20 Malignant neoplasm of rectum: Secondary | ICD-10-CM

## 2022-05-16 MED ORDER — STIVARGA 40 MG PO TABS
120.0000 mg | ORAL_TABLET | Freq: Every day | ORAL | 0 refills | Status: DC
Start: 2022-05-16 — End: 2022-06-19
  Filled 2022-05-16: qty 63, 28d supply, fill #0

## 2022-05-16 NOTE — Telephone Encounter (Signed)
Component Ref Range & Units 2 d ago (05/14/22) 1 mo ago (04/16/22) 1 mo ago (03/19/22) 2 mo ago (02/19/22) 3 mo ago (02/15/22) 3 mo ago (02/05/22) 3 mo ago (02/04/22)  WBC Count 4.0 - 10.5 K/uL 7.4 10.1 7.5 11.9 High  14.5 High  10.1 11.5 High   RBC 4.22 - 5.81 MIL/uL 4.26 4.42 3.47 Low  3.54 Low  3.73 Low  3.47 Low  3.31 Low   Hemoglobin 13.0 - 17.0 g/dL 09.8 11.9 14.7 Low  82.9 Low  12.5 Low  11.8 Low  11.4 Low   HCT 39.0 - 52.0 % 42.9 44.8 36.4 Low  37.2 Low  39.3 35.1 Low  33.5 Low   MCV 80.0 - 100.0 fL 100.7 High  101.4 High  104.9 High  105.1 High  105.4 High  101.2 High  101.2 High   MCH 26.0 - 34.0 pg 33.3 32.8 33.1 33.9 33.5 34.0 34.4 High   MCHC 30.0 - 36.0 g/dL 56.2 13.0 86.5 78.4 69.6 33.6 34.0  RDW 11.5 - 15.5 % 18.4 High  16.0 High  17.7 High  19.1 High  19.9 High  19.6 High  19.1 High   Platelet Count 150 - 400 K/uL 78 Low  130 Low  103 Low  163 171 103 Low  93 Low  CM  nRBC 0.0 - 0.2 % 0.0 0.0 0.0 0.0 0.0 CM 0.0 0.2  Neutrophils Relative % % 62 72 60 74  73 73  Neutro Abs 1.7 - 7.7 K/uL 4.7 7.3 4.5 8.7 High   7.5 8.4 High   Lymphocytes Relative % 25 19 21 12  11 13   Lymphs Abs 0.7 - 4.0 K/uL 1.8 1.9 1.5 1.5  1.1 1.5  Monocytes Relative % 8 7 14 11  11 9   Monocytes Absolute 0.1 - 1.0 K/uL 0.6 0.7 1.1 High  1.3 High   1.1 High  1.1 High   Eosinophils Relative % 4 1 4 1  2 2   Eosinophils Absolute 0.0 - 0.5 K/uL 0.3 0.1 0.3 0.1  0.2 0.2  Basophils Relative % 1 1 1 2  1 1   Basophils Absolute 0.0 - 0.1 K/uL 0.1 0.1 0.1 0.2 High   0.1 0.1  Immature Granulocytes % 0 0 0 0  2 2  Abs Immature Granulocytes 0.00 - 0.07 K/uL 0.02 0.02 CM 0.02 CM 0.05 CM  0.16 High  CM 0.20 High  CM  Comment: Performed at [facility], [address] [place], [place] [address]  WBC Morphology      DOHLE BODIES CM DOHLE BODIES CM  RBC Morphology      MORPHOLOGY UNREMARKABLE MIXED RBC POPULATION  Smear Review      Normal platelet morphology CM Normal platelet morphology             Component Ref Range & Units 2 d ago (05/14/22) 1 mo ago (04/16/22) 1 mo ago (03/19/22) 2 mo ago (02/19/22) 3 mo ago (02/15/22) 3 mo ago (02/15/22) 3 mo ago (02/05/22)  Sodium 135 - 145 mmol/L 133 Low  135 135 136  136 133 Low   Potassium 3.5 - 5.1 mmol/L 4.5 4.0 4.9 4.5  4.1 4.0  Chloride 98 - 111 mmol/L 104 103 105 103  102 100  CO2 22 - 32 mmol/L 23 25 24 25  25 22   Glucose, Bld 70 - 99 mg/dL 413 High  244 High  CM 95 CM 164 High  CM  173 High  CM 124 High  CM  Comment: Glucose reference range applies only to samples taken after fasting for at least 8 hours.  BUN 8 - 23 mg/dL 22 24 High  15 22  20 31  High   Creatinine 0.61 - 1.24 mg/dL 8.29 High  5.62 High  1.30 High  1.62 High   1.54 High  2.30 High   Calcium 8.9 - 10.3 mg/dL 8.0 Low  8.4 Low  8.3 Low  8.2 Low   8.0 Low  8.2 Low   Total Protein 6.5 - 8.1 g/dL 5.9 Low  6.4 Low  5.8 Low  5.3 Low  6.1 Low   5.1 Low   Albumin 3.5 - 5.0 g/dL 2.6 Low  3.0 Low  2.7 Low  2.6 Low  2.9 Low   2.7 Low   AST 15 - 41 U/L 56 High  47 High  37 73 High  97 High   43 High   ALT 0 - 44 U/L 51 High  41 31 75 High  100 High   32  Alkaline Phosphatase 38 - 126 U/L 255 High  337 High  392 High  403 High  358 High   252 High   Total Bilirubin 0.3 - 1.2 mg/dL 2.4 High  2.4 High  1.0 0.9 1.2  0.8  GFR, Estimated >60 mL/min 44 Low  42 Low  CM       Comment: (NOTE) Calculated using the CKD-EPI Creatinine Equation (2021)  Anion gap 5 - 15 6 7  CM 6 CM 8 CM  9 CM 11 CM  Comment: Performed at [facility], [address] [place], [place] [address]  Resulting Agency [facility]         Specimen Collected: 05/14/22 15:02 Last Resulted: 05/14/22 15:49

## 2022-05-17 ENCOUNTER — Other Ambulatory Visit (HOSPITAL_COMMUNITY): Payer: Self-pay

## 2022-05-23 ENCOUNTER — Other Ambulatory Visit: Payer: Self-pay | Admitting: *Deleted

## 2022-05-23 MED ORDER — NYSTATIN 100000 UNIT/ML MT SUSP: 5.0000 mL | Freq: Four times a day (QID) | OROMUCOSAL | 1 refills | Status: DC | PRN

## 2022-05-28 ENCOUNTER — Other Ambulatory Visit (HOSPITAL_COMMUNITY): Payer: Self-pay

## 2022-05-31 ENCOUNTER — Other Ambulatory Visit: Payer: Self-pay | Admitting: Urology

## 2022-05-31 DIAGNOSIS — N138 Other obstructive and reflux uropathy: Secondary | ICD-10-CM

## 2022-05-31 DIAGNOSIS — N529 Male erectile dysfunction, unspecified: Secondary | ICD-10-CM

## 2022-06-03 DIAGNOSIS — D84821 Immunodeficiency due to drugs: Secondary | ICD-10-CM | POA: Diagnosis not present

## 2022-06-03 DIAGNOSIS — Z008 Encounter for other general examination: Secondary | ICD-10-CM | POA: Diagnosis not present

## 2022-06-03 DIAGNOSIS — J439 Emphysema, unspecified: Secondary | ICD-10-CM | POA: Diagnosis not present

## 2022-06-03 DIAGNOSIS — I509 Heart failure, unspecified: Secondary | ICD-10-CM | POA: Diagnosis not present

## 2022-06-03 DIAGNOSIS — I2510 Atherosclerotic heart disease of native coronary artery without angina pectoris: Secondary | ICD-10-CM | POA: Diagnosis not present

## 2022-06-03 DIAGNOSIS — Z8249 Family history of ischemic heart disease and other diseases of the circulatory system: Secondary | ICD-10-CM | POA: Diagnosis not present

## 2022-06-03 DIAGNOSIS — M1990 Unspecified osteoarthritis, unspecified site: Secondary | ICD-10-CM | POA: Diagnosis not present

## 2022-06-03 DIAGNOSIS — K5900 Constipation, unspecified: Secondary | ICD-10-CM | POA: Diagnosis not present

## 2022-06-03 DIAGNOSIS — I130 Hypertensive heart and chronic kidney disease with heart failure and stage 1 through stage 4 chronic kidney disease, or unspecified chronic kidney disease: Secondary | ICD-10-CM | POA: Diagnosis not present

## 2022-06-03 DIAGNOSIS — E785 Hyperlipidemia, unspecified: Secondary | ICD-10-CM | POA: Diagnosis not present

## 2022-06-03 DIAGNOSIS — K219 Gastro-esophageal reflux disease without esophagitis: Secondary | ICD-10-CM | POA: Diagnosis not present

## 2022-06-03 DIAGNOSIS — N529 Male erectile dysfunction, unspecified: Secondary | ICD-10-CM | POA: Diagnosis not present

## 2022-06-03 DIAGNOSIS — I252 Old myocardial infarction: Secondary | ICD-10-CM | POA: Diagnosis not present

## 2022-06-11 ENCOUNTER — Encounter: Payer: Self-pay | Admitting: Internal Medicine

## 2022-06-11 ENCOUNTER — Inpatient Hospital Stay: Payer: Medicare HMO | Attending: Internal Medicine

## 2022-06-11 ENCOUNTER — Inpatient Hospital Stay (HOSPITAL_BASED_OUTPATIENT_CLINIC_OR_DEPARTMENT_OTHER): Payer: Medicare HMO | Admitting: Internal Medicine

## 2022-06-11 VITALS — BP 117/77 | HR 84 | Temp 95.8°F | Ht 67.0 in | Wt 142.8 lb

## 2022-06-11 DIAGNOSIS — J439 Emphysema, unspecified: Secondary | ICD-10-CM | POA: Insufficient documentation

## 2022-06-11 DIAGNOSIS — N183 Chronic kidney disease, stage 3 unspecified: Secondary | ICD-10-CM | POA: Diagnosis not present

## 2022-06-11 DIAGNOSIS — G629 Polyneuropathy, unspecified: Secondary | ICD-10-CM | POA: Insufficient documentation

## 2022-06-11 DIAGNOSIS — Z87891 Personal history of nicotine dependence: Secondary | ICD-10-CM | POA: Diagnosis not present

## 2022-06-11 DIAGNOSIS — Z800 Family history of malignant neoplasm of digestive organs: Secondary | ICD-10-CM | POA: Insufficient documentation

## 2022-06-11 DIAGNOSIS — R600 Localized edema: Secondary | ICD-10-CM | POA: Insufficient documentation

## 2022-06-11 DIAGNOSIS — Z79899 Other long term (current) drug therapy: Secondary | ICD-10-CM | POA: Insufficient documentation

## 2022-06-11 DIAGNOSIS — R7989 Other specified abnormal findings of blood chemistry: Secondary | ICD-10-CM

## 2022-06-11 DIAGNOSIS — D509 Iron deficiency anemia, unspecified: Secondary | ICD-10-CM | POA: Diagnosis not present

## 2022-06-11 DIAGNOSIS — I2510 Atherosclerotic heart disease of native coronary artery without angina pectoris: Secondary | ICD-10-CM | POA: Diagnosis not present

## 2022-06-11 DIAGNOSIS — D696 Thrombocytopenia, unspecified: Secondary | ICD-10-CM | POA: Insufficient documentation

## 2022-06-11 DIAGNOSIS — M7989 Other specified soft tissue disorders: Secondary | ICD-10-CM | POA: Insufficient documentation

## 2022-06-11 DIAGNOSIS — N400 Enlarged prostate without lower urinary tract symptoms: Secondary | ICD-10-CM | POA: Insufficient documentation

## 2022-06-11 DIAGNOSIS — C787 Secondary malignant neoplasm of liver and intrahepatic bile duct: Secondary | ICD-10-CM | POA: Insufficient documentation

## 2022-06-11 DIAGNOSIS — C20 Malignant neoplasm of rectum: Secondary | ICD-10-CM | POA: Diagnosis not present

## 2022-06-11 DIAGNOSIS — R918 Other nonspecific abnormal finding of lung field: Secondary | ICD-10-CM | POA: Insufficient documentation

## 2022-06-11 DIAGNOSIS — I129 Hypertensive chronic kidney disease with stage 1 through stage 4 chronic kidney disease, or unspecified chronic kidney disease: Secondary | ICD-10-CM | POA: Insufficient documentation

## 2022-06-11 DIAGNOSIS — Z803 Family history of malignant neoplasm of breast: Secondary | ICD-10-CM | POA: Diagnosis not present

## 2022-06-11 DIAGNOSIS — I252 Old myocardial infarction: Secondary | ICD-10-CM | POA: Diagnosis not present

## 2022-06-11 DIAGNOSIS — Z7982 Long term (current) use of aspirin: Secondary | ICD-10-CM | POA: Insufficient documentation

## 2022-06-11 LAB — CBC WITH DIFFERENTIAL (CANCER CENTER ONLY)
Abs Immature Granulocytes: 0.05 10*3/uL (ref 0.00–0.07)
Basophils Absolute: 0.1 10*3/uL (ref 0.0–0.1)
Basophils Relative: 1 %
Eosinophils Absolute: 0.3 10*3/uL (ref 0.0–0.5)
Eosinophils Relative: 3 %
HCT: 47.2 % (ref 39.0–52.0)
Hemoglobin: 15.8 g/dL (ref 13.0–17.0)
Immature Granulocytes: 0 %
Lymphocytes Relative: 17 %
Lymphs Abs: 2.1 10*3/uL (ref 0.7–4.0)
MCH: 33.5 pg (ref 26.0–34.0)
MCHC: 33.5 g/dL (ref 30.0–36.0)
MCV: 100.2 fL — ABNORMAL HIGH (ref 80.0–100.0)
Monocytes Absolute: 1.1 10*3/uL — ABNORMAL HIGH (ref 0.1–1.0)
Monocytes Relative: 9 %
Neutro Abs: 8.4 10*3/uL — ABNORMAL HIGH (ref 1.7–7.7)
Neutrophils Relative %: 70 %
Platelet Count: 97 10*3/uL — ABNORMAL LOW (ref 150–400)
RBC: 4.71 MIL/uL (ref 4.22–5.81)
RDW: 20.1 % — ABNORMAL HIGH (ref 11.5–15.5)
Smear Review: NORMAL
WBC Count: 12 10*3/uL — ABNORMAL HIGH (ref 4.0–10.5)
nRBC: 0 % (ref 0.0–0.2)

## 2022-06-11 LAB — CMP (CANCER CENTER ONLY)
ALT: 46 U/L — ABNORMAL HIGH (ref 0–44)
AST: 58 U/L — ABNORMAL HIGH (ref 15–41)
Albumin: 2.5 g/dL — ABNORMAL LOW (ref 3.5–5.0)
Alkaline Phosphatase: 261 U/L — ABNORMAL HIGH (ref 38–126)
Anion gap: 8 (ref 5–15)
BUN: 28 mg/dL — ABNORMAL HIGH (ref 8–23)
CO2: 22 mmol/L (ref 22–32)
Calcium: 8.2 mg/dL — ABNORMAL LOW (ref 8.9–10.3)
Chloride: 101 mmol/L (ref 98–111)
Creatinine: 1.7 mg/dL — ABNORMAL HIGH (ref 0.61–1.24)
GFR, Estimated: 41 mL/min — ABNORMAL LOW (ref >60)
Glucose, Bld: 98 mg/dL (ref 70–99)
Potassium: 4.8 mmol/L (ref 3.5–5.1)
Sodium: 131 mmol/L — ABNORMAL LOW (ref 135–145)
Total Bilirubin: 2.7 mg/dL — ABNORMAL HIGH (ref 0.3–1.2)
Total Protein: 6.1 g/dL — ABNORMAL LOW (ref 6.5–8.1)

## 2022-06-11 LAB — PROTEIN / CREATININE RATIO, URINE
Creatinine, Urine: 240 mg/dL
Protein Creatinine Ratio: 0.09 mg/mg{Cre} (ref 0.00–0.15)
Total Protein, Urine: 22 mg/dL

## 2022-06-11 LAB — BRAIN NATRIURETIC PEPTIDE: B Natriuretic Peptide: 505.4 pg/mL — ABNORMAL HIGH (ref 0.0–100.0)

## 2022-06-11 NOTE — Progress Notes (Signed)
C/o when wiping bottom, seeing a pink color.  Urine is a dark color, no pain or burning.
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
## 2022-06-11 NOTE — Assessment & Plan Note (Addendum)
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 AUG 2023- Currently on FOLFIRI+ m-Vasi.   DEC 8th, 2023- Response to therapy of pulmonary metastasis; No new or progressive disease. MOST recently on s/p FOLFIRI + M-vasi #9.  March 13th, 2024- PET scan: No evidence of hepatic metastasis or explanation for elevated liver function tests Pulmonary metastasis, progressive since 02/15/2022; Persistent small mildly hypermetabolic mediastinal node, favored to be reactive;Resolved rectosigmoid hypermetabolism. Improved cecal hypermetabolism, likely a site of primary.  Discontinue FOLFIRI + M-vasi-second intolerance.  # patient currently on Regarafenib. currently ramped up to  120 mg with #3. Continue current dose. Patient will take 3 weeks on 1 week off. Will order CT chest at next visit. See below re: liver MRI.   # Rising LFTs/ bili-? Etiology- MARCH 2024-PET scan- negative for liver cancer; since worse will order MRI liver ASAP.    # Bil LE swelling/ ? Fluid over load- recommend stocking. April, 2 D echo - 40%  Currently on  lasix 20 mg a day; recommend twice a week-  given improvement in fluid status. Stable.   # Intermittent rectal bleeding- [DEC, 2023]- flexible sigmoidoscopy with Dr. Allegra Lai.  No progressive rectal cancer noted-  stable.    # Anemia/iron deficiency hemoglobin ~11-12  secondary to chronic GI bleeding/tumor-slightly worse; see above. NOV Iron sat- 17; ferritin- 100.  stable.    # Thrombocytopenia/secondary chemotherapy/on aspirin-platelets- 70s- monitor for now.   stable.    # HTN- 150s/94; at home BP reviewed- continue checking BP at home/reviewed the log from home.  Stable.   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 discontinued oxaliplatin for now. Poor tolerance to cymbalta 30 mg q day. Stable.  # Chronic kidney disease stage  III-[GFR-43 ] -monitor closely on Lasix.  stable;   # Prostatomegaly [Urology; Ms.McGowan]- check PSA at next visit.  * CEA- not marker;  # DISPOSITION:  # MRI liver ASAP-  # follow up in 2 weeks- MD; labs-cbc/cmp; BNP; PSA-  Dr.B

## 2022-06-14 ENCOUNTER — Ambulatory Visit
Admission: RE | Admit: 2022-06-14 | Discharge: 2022-06-14 | Disposition: A | Discharge status: Home / Self Care | Payer: Medicare HMO | Source: Ambulatory Visit | Attending: Internal Medicine | Admitting: Internal Medicine

## 2022-06-14 DIAGNOSIS — R7989 Other specified abnormal findings of blood chemistry: Secondary | ICD-10-CM | POA: Diagnosis not present

## 2022-06-14 DIAGNOSIS — C20 Malignant neoplasm of rectum: Secondary | ICD-10-CM | POA: Diagnosis not present

## 2022-06-14 DIAGNOSIS — C787 Secondary malignant neoplasm of liver and intrahepatic bile duct: Secondary | ICD-10-CM | POA: Diagnosis not present

## 2022-06-14 DIAGNOSIS — C801 Malignant (primary) neoplasm, unspecified: Secondary | ICD-10-CM | POA: Diagnosis not present

## 2022-06-14 DIAGNOSIS — N281 Cyst of kidney, acquired: Secondary | ICD-10-CM | POA: Diagnosis not present

## 2022-06-14 MED ORDER — GADOBUTROL 1 MMOL/ML IV SOLN
6.0000 mL | Freq: Once | INTRAVENOUS | Status: AC | PRN
  Administered 2022-06-14: 6 mL via INTRAVENOUS

## 2022-06-19 ENCOUNTER — Other Ambulatory Visit: Payer: Self-pay | Admitting: Internal Medicine

## 2022-06-19 ENCOUNTER — Other Ambulatory Visit (HOSPITAL_COMMUNITY): Payer: Self-pay

## 2022-06-19 ENCOUNTER — Other Ambulatory Visit: Payer: Self-pay

## 2022-06-19 DIAGNOSIS — C20 Malignant neoplasm of rectum: Secondary | ICD-10-CM

## 2022-06-19 MED ORDER — STIVARGA 40 MG PO TABS
120.0000 mg | ORAL_TABLET | Freq: Every day | ORAL | 0 refills | Status: DC
Start: 2022-06-19 — End: 2022-07-11
  Filled 2022-06-19: qty 63, 28d supply, fill #0

## 2022-06-21 ENCOUNTER — Telehealth: Payer: Self-pay | Admitting: Internal Medicine

## 2022-06-21 DIAGNOSIS — R188 Other ascites: Secondary | ICD-10-CM

## 2022-06-21 DIAGNOSIS — C20 Malignant neoplasm of rectum: Secondary | ICD-10-CM

## 2022-06-21 NOTE — Telephone Encounter (Signed)
Spoke to the patient's daughter Efraim Kaufmann regarding the results of the liver MRI-no obvious evidence of medical in the liver however limited by ascites.  Recommend paracentesis.  BC- Please schedule paracentesis ASAP; recommend keep appointments as planned next week- GB

## 2022-06-24 ENCOUNTER — Other Ambulatory Visit: Payer: Self-pay | Admitting: Internal Medicine

## 2022-06-24 ENCOUNTER — Other Ambulatory Visit: Payer: Self-pay

## 2022-06-24 ENCOUNTER — Telehealth: Payer: Self-pay

## 2022-06-24 DIAGNOSIS — E538 Deficiency of other specified B group vitamins: Secondary | ICD-10-CM | POA: Insufficient documentation

## 2022-06-24 DIAGNOSIS — R188 Other ascites: Secondary | ICD-10-CM

## 2022-06-24 DIAGNOSIS — Z7729 Contact with and (suspected ) exposure to other hazardous substances: Secondary | ICD-10-CM | POA: Insufficient documentation

## 2022-06-24 NOTE — Telephone Encounter (Signed)
Added by Roxie.

## 2022-06-24 NOTE — Telephone Encounter (Signed)
Noted  

## 2022-06-24 NOTE — Telephone Encounter (Signed)
I spoke with the wife regarding paracentesis scheduling, who told me to contact their daughter, She didn't answer so I left her a voice mail with the time and day and told her to call specialty schedulers [phone]) to confirm or not. her name is Efraim Kaufmann hunt and # is in the chart

## 2022-06-26 ENCOUNTER — Ambulatory Visit
Admission: RE | Admit: 2022-06-26 | Discharge: 2022-06-26 | Disposition: A | Discharge status: Home / Self Care | Payer: Medicare HMO | Source: Ambulatory Visit | Attending: Internal Medicine | Admitting: Internal Medicine

## 2022-06-26 ENCOUNTER — Other Ambulatory Visit: Payer: Self-pay

## 2022-06-26 DIAGNOSIS — R188 Other ascites: Secondary | ICD-10-CM

## 2022-06-26 DIAGNOSIS — C20 Malignant neoplasm of rectum: Secondary | ICD-10-CM

## 2022-06-26 DIAGNOSIS — C19 Malignant neoplasm of rectosigmoid junction: Secondary | ICD-10-CM | POA: Diagnosis not present

## 2022-06-26 LAB — LACTATE DEHYDROGENASE, PLEURAL OR PERITONEAL FLUID: LD, Fluid: 39 U/L — ABNORMAL HIGH (ref 3–23)

## 2022-06-26 LAB — ALBUMIN, PLEURAL OR PERITONEAL FLUID: Albumin, Fluid: <1.5 g/dL

## 2022-06-26 LAB — PROTEIN, PLEURAL OR PERITONEAL FLUID: Total protein, fluid: <3 g/dL

## 2022-06-26 LAB — AMYLASE, PLEURAL OR PERITONEAL FLUID: Amylase, Fluid: 30 U/L

## 2022-06-26 LAB — BODY FLUID CELL COUNT WITH DIFFERENTIAL
Eos, Fluid: 0 %
Lymphs, Fluid: 46 %
Monocyte-Macrophage-Serous Fluid: 45 %
Neutrophil Count, Fluid: 9 %
Total Nucleated Cell Count, Fluid: 220 cu mm

## 2022-06-26 LAB — GLUCOSE, PLEURAL OR PERITONEAL FLUID: Glucose, Fluid: 128 mg/dL

## 2022-06-26 MED ORDER — LIDOCAINE HCL (PF) 1 % IJ SOLN
10.0000 mL | Freq: Once | INTRAMUSCULAR | Status: AC
  Administered 2022-06-26: 10 mL via INTRADERMAL
  Filled 2022-06-26: qty 10

## 2022-06-26 MED ORDER — ALBUMIN HUMAN 25 % IV SOLN: 25.0000 g | Freq: Once | INTRAVENOUS | Status: DC

## 2022-06-26 MED ORDER — ALBUMIN HUMAN 25 % IV SOLN
INTRAVENOUS | Status: AC
  Administered 2022-06-26: 25 g
  Filled 2022-06-26: qty 100

## 2022-06-26 NOTE — Discharge Instructions (Signed)
Discharge instructions reviewed with patient.

## 2022-06-26 NOTE — Procedures (Signed)
PROCEDURE SUMMARY:  Successful US guided diagnostic and therapeutic paracentesis from RUQ.  Yielded 1.5 L of clear, yellow fluid.  No immediate complications.  Pt tolerated well.   Specimen was sent for labs.  EBL < 1 mL  Shon Hough, AGNP 06/26/2022 10:28 AM

## 2022-06-26 NOTE — Addendum Note (Signed)
Addended by: Ihor Austin D on: 06/26/2022 09:26 AM   Modules accepted: Orders

## 2022-06-26 NOTE — Progress Notes (Signed)
Albert Giles came for albumin infusion, tolerated well. Daughter at bedside.

## 2022-06-27 ENCOUNTER — Inpatient Hospital Stay: Payer: Medicare HMO

## 2022-06-27 ENCOUNTER — Encounter: Payer: Self-pay | Admitting: Internal Medicine

## 2022-06-27 ENCOUNTER — Inpatient Hospital Stay: Payer: Medicare HMO | Attending: Internal Medicine | Admitting: Internal Medicine

## 2022-06-27 VITALS — BP 141/83 | HR 71 | Temp 96.8°F | Ht 67.0 in | Wt 145.6 lb

## 2022-06-27 DIAGNOSIS — Z7982 Long term (current) use of aspirin: Secondary | ICD-10-CM | POA: Insufficient documentation

## 2022-06-27 DIAGNOSIS — D509 Iron deficiency anemia, unspecified: Secondary | ICD-10-CM | POA: Diagnosis not present

## 2022-06-27 DIAGNOSIS — Z79899 Other long term (current) drug therapy: Secondary | ICD-10-CM | POA: Insufficient documentation

## 2022-06-27 DIAGNOSIS — R918 Other nonspecific abnormal finding of lung field: Secondary | ICD-10-CM | POA: Diagnosis not present

## 2022-06-27 DIAGNOSIS — C20 Malignant neoplasm of rectum: Secondary | ICD-10-CM | POA: Insufficient documentation

## 2022-06-27 DIAGNOSIS — N400 Enlarged prostate without lower urinary tract symptoms: Secondary | ICD-10-CM | POA: Diagnosis not present

## 2022-06-27 DIAGNOSIS — C787 Secondary malignant neoplasm of liver and intrahepatic bile duct: Secondary | ICD-10-CM | POA: Diagnosis not present

## 2022-06-27 DIAGNOSIS — R7989 Other specified abnormal findings of blood chemistry: Secondary | ICD-10-CM

## 2022-06-27 DIAGNOSIS — I130 Hypertensive heart and chronic kidney disease with heart failure and stage 1 through stage 4 chronic kidney disease, or unspecified chronic kidney disease: Secondary | ICD-10-CM | POA: Diagnosis not present

## 2022-06-27 DIAGNOSIS — D696 Thrombocytopenia, unspecified: Secondary | ICD-10-CM | POA: Diagnosis not present

## 2022-06-27 DIAGNOSIS — N183 Chronic kidney disease, stage 3 unspecified: Secondary | ICD-10-CM | POA: Diagnosis not present

## 2022-06-27 LAB — CBC WITH DIFFERENTIAL (CANCER CENTER ONLY)
Abs Immature Granulocytes: 0.04 10*3/uL (ref 0.00–0.07)
Basophils Absolute: 0.1 10*3/uL (ref 0.0–0.1)
Basophils Relative: 1 %
Eosinophils Absolute: 0.3 10*3/uL (ref 0.0–0.5)
Eosinophils Relative: 3 %
HCT: 35.3 % — ABNORMAL LOW (ref 39.0–52.0)
Hemoglobin: 11.7 g/dL — ABNORMAL LOW (ref 13.0–17.0)
Immature Granulocytes: 0 %
Lymphocytes Relative: 17 %
Lymphs Abs: 1.7 10*3/uL (ref 0.7–4.0)
MCH: 34.9 pg — ABNORMAL HIGH (ref 26.0–34.0)
MCHC: 33.1 g/dL (ref 30.0–36.0)
MCV: 105.4 fL — ABNORMAL HIGH (ref 80.0–100.0)
Monocytes Absolute: 1.1 10*3/uL — ABNORMAL HIGH (ref 0.1–1.0)
Monocytes Relative: 11 %
Neutro Abs: 7.1 10*3/uL (ref 1.7–7.7)
Neutrophils Relative %: 68 %
Platelet Count: 89 10*3/uL — ABNORMAL LOW (ref 150–400)
RBC: 3.35 MIL/uL — ABNORMAL LOW (ref 4.22–5.81)
RDW: 20.6 % — ABNORMAL HIGH (ref 11.5–15.5)
WBC Count: 10.4 10*3/uL (ref 4.0–10.5)
nRBC: 0 % (ref 0.0–0.2)

## 2022-06-27 LAB — CMP (CANCER CENTER ONLY)
ALT: 34 U/L (ref 0–44)
AST: 40 U/L (ref 15–41)
Albumin: 2.4 g/dL — ABNORMAL LOW (ref 3.5–5.0)
Alkaline Phosphatase: 211 U/L — ABNORMAL HIGH (ref 38–126)
Anion gap: 7 (ref 5–15)
BUN: 27 mg/dL — ABNORMAL HIGH (ref 8–23)
CO2: 21 mmol/L — ABNORMAL LOW (ref 22–32)
Calcium: 7.9 mg/dL — ABNORMAL LOW (ref 8.9–10.3)
Chloride: 108 mmol/L (ref 98–111)
Creatinine: 1.62 mg/dL — ABNORMAL HIGH (ref 0.61–1.24)
GFR, Estimated: 43 mL/min — ABNORMAL LOW (ref >60)
Glucose, Bld: 145 mg/dL — ABNORMAL HIGH (ref 70–99)
Potassium: 4.6 mmol/L (ref 3.5–5.1)
Sodium: 136 mmol/L (ref 135–145)
Total Bilirubin: 2.1 mg/dL — ABNORMAL HIGH (ref 0.3–1.2)
Total Protein: 5.4 g/dL — ABNORMAL LOW (ref 6.5–8.1)

## 2022-06-27 LAB — BODY FLUID CULTURE W GRAM STAIN

## 2022-06-27 LAB — BRAIN NATRIURETIC PEPTIDE: B Natriuretic Peptide: 1655.1 pg/mL — ABNORMAL HIGH (ref 0.0–100.0)

## 2022-06-27 LAB — PSA: Prostatic Specific Antigen: 1.28 ng/mL (ref 0.00–4.00)

## 2022-06-27 MED ORDER — HEPARIN SOD (PORK) LOCK FLUSH 100 UNIT/ML IV SOLN
500.0000 [IU] | Freq: Once | INTRAVENOUS | Status: AC
  Administered 2022-06-27: 500 [IU] via INTRAVENOUS
  Filled 2022-06-27: qty 5

## 2022-06-27 MED ORDER — SODIUM CHLORIDE 0.9% FLUSH
10.0000 mL | INTRAVENOUS | Status: DC | PRN
  Administered 2022-06-27: 10 mL via INTRAVENOUS
  Filled 2022-06-27: qty 10

## 2022-06-27 MED ORDER — FUROSEMIDE 40 MG PO TABS: 40.0000 mg | ORAL_TABLET | Freq: Every day | ORAL | 1 refills | Status: DC
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
## 2022-06-27 NOTE — Progress Notes (Signed)
Why was the fluid in his stomach. Had taken off yesterday, 1.5 liters per pt.

## 2022-06-27 NOTE — Assessment & Plan Note (Addendum)
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  March 13th, 2024- PET scan: No evidence of hepatic metastasis or explanation for elevated liver function tests Pulmonary metastasis, progressive since 02/15/2022; Persistent small mildly hypermetabolic mediastinal node, favored to be reactive;Resolved rectosigmoid hypermetabolism. Improved cecal hypermetabolism, likely a site of primary.   MID- MARCH 2024-  Regorafenib [3 weeks on 1 week off.]. currently ramped up to  120 mg.   # patient currently on Regorafenib. currently ramped up to  120 mg with #4 cycle- Continue current dose. Patient will take 3 weeks on 1 week off. MAY 31st, 2024- MRI liver-  [limited with significant artifact]-no evidence of liver metastatic disease;  Coarse contour of the liver suggesting cirrhosis; Large volume ascites. S/p para [June 12th, 2024]; Will order CT chest  today-.   # Rising LFTs/ bili-? Etiology- see above- MRI- ?  Decompensated cirrhosis.  S/p paracentesis-transudative; however awaiting cytology.   # Bil LE swelling/ ? Fluid over load [multifactorial- cirrhosis/CHF/CKD]- recommend stocking. April, 2 D echo - 40%  recommend starting lasix 40 mg da/y. BNP- higher.  Script sent.   # Intermittent rectal bleeding- [DEC, 2023]- flexible sigmoidoscopy with Dr. Allegra Lai.  No progressive rectal cancer noted-  stable.    # Anemia/iron deficiency hemoglobin ~11-12  secondary to chronic GI bleeding/tumor-slightly worse; see above. NOV Iron sat- 17; ferritin- 100.  stable.    # Thrombocytopenia/secondary chemotherapy/on aspirin-platelets- 70s- 80s- monitor for now.   stable.    # HTN- 150s/94; at home BP reviewed- continue checking BP at home/reviewed the log from home. stable.    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 Stable.  # Chronic kidney disease stage  III-[GFR-43 ] -monitor closely on Lasix.  stable;   # Prostatomegaly [Urology; Ms.McGowan]- await PSA at next visit.  * CEA- not marker;  # DISPOSITION:  # follow up in 2 weeks- APP; labs-cbc/cmp; BNP;-  # follow up in week of July 15th - MD; labs-cbc/cmp; BNP;CT chest prior--Dr.B  # I reviewed the blood work- with the patient in detail; also reviewed the imaging independently [as summarized above]; and with the patient in detail.

## 2022-06-28 ENCOUNTER — Encounter: Payer: Self-pay | Admitting: Internal Medicine

## 2022-06-28 ENCOUNTER — Other Ambulatory Visit: Payer: Self-pay

## 2022-06-28 LAB — BODY FLUID CULTURE W GRAM STAIN

## 2022-06-29 LAB — BODY FLUID CULTURE W GRAM STAIN: Culture: NO GROWTH

## 2022-06-30 LAB — LIPASE, FLUID: Lipase-Fluid: 58 U/L

## 2022-06-30 LAB — TRIGLYCERIDES, BODY FLUIDS: Triglycerides, Fluid: 11 mg/dL

## 2022-07-04 ENCOUNTER — Ambulatory Visit
Admission: RE | Admit: 2022-07-04 | Discharge: 2022-07-04 | Disposition: A | Discharge status: Home / Self Care | Payer: Medicare HMO | Source: Ambulatory Visit | Attending: Internal Medicine | Admitting: Internal Medicine

## 2022-07-04 ENCOUNTER — Other Ambulatory Visit: Payer: Medicare HMO

## 2022-07-04 ENCOUNTER — Other Ambulatory Visit: Payer: Self-pay

## 2022-07-04 DIAGNOSIS — R918 Other nonspecific abnormal finding of lung field: Secondary | ICD-10-CM | POA: Insufficient documentation

## 2022-07-04 DIAGNOSIS — C785 Secondary malignant neoplasm of large intestine and rectum: Secondary | ICD-10-CM | POA: Diagnosis not present

## 2022-07-04 DIAGNOSIS — C20 Malignant neoplasm of rectum: Secondary | ICD-10-CM | POA: Diagnosis not present

## 2022-07-04 DIAGNOSIS — C801 Malignant (primary) neoplasm, unspecified: Secondary | ICD-10-CM | POA: Diagnosis not present

## 2022-07-09 ENCOUNTER — Ambulatory Visit: Payer: Medicare HMO | Admitting: Urology

## 2022-07-11 ENCOUNTER — Inpatient Hospital Stay (HOSPITAL_BASED_OUTPATIENT_CLINIC_OR_DEPARTMENT_OTHER): Payer: Medicare HMO | Admitting: Nurse Practitioner

## 2022-07-11 ENCOUNTER — Inpatient Hospital Stay: Payer: Medicare HMO

## 2022-07-11 ENCOUNTER — Encounter: Payer: Self-pay | Admitting: Nurse Practitioner

## 2022-07-11 ENCOUNTER — Other Ambulatory Visit: Payer: Self-pay

## 2022-07-11 ENCOUNTER — Other Ambulatory Visit (HOSPITAL_COMMUNITY): Payer: Self-pay

## 2022-07-11 ENCOUNTER — Other Ambulatory Visit: Payer: Self-pay | Admitting: Internal Medicine

## 2022-07-11 VITALS — BP 115/82 | HR 69 | Temp 97.1°F | Resp 18 | Ht 67.0 in | Wt 137.3 lb

## 2022-07-11 DIAGNOSIS — K529 Noninfective gastroenteritis and colitis, unspecified: Secondary | ICD-10-CM | POA: Diagnosis not present

## 2022-07-11 DIAGNOSIS — Z09 Encounter for follow-up examination after completed treatment for conditions other than malignant neoplasm: Secondary | ICD-10-CM | POA: Diagnosis not present

## 2022-07-11 DIAGNOSIS — K7469 Other cirrhosis of liver: Secondary | ICD-10-CM

## 2022-07-11 DIAGNOSIS — R7989 Other specified abnormal findings of blood chemistry: Secondary | ICD-10-CM | POA: Diagnosis not present

## 2022-07-11 DIAGNOSIS — R188 Other ascites: Secondary | ICD-10-CM

## 2022-07-11 DIAGNOSIS — C20 Malignant neoplasm of rectum: Secondary | ICD-10-CM | POA: Diagnosis not present

## 2022-07-11 DIAGNOSIS — Z7982 Long term (current) use of aspirin: Secondary | ICD-10-CM | POA: Diagnosis not present

## 2022-07-11 DIAGNOSIS — Z79899 Other long term (current) drug therapy: Secondary | ICD-10-CM | POA: Diagnosis not present

## 2022-07-11 DIAGNOSIS — R918 Other nonspecific abnormal finding of lung field: Secondary | ICD-10-CM

## 2022-07-11 DIAGNOSIS — N183 Chronic kidney disease, stage 3 unspecified: Secondary | ICD-10-CM | POA: Diagnosis not present

## 2022-07-11 DIAGNOSIS — I130 Hypertensive heart and chronic kidney disease with heart failure and stage 1 through stage 4 chronic kidney disease, or unspecified chronic kidney disease: Secondary | ICD-10-CM | POA: Diagnosis not present

## 2022-07-11 DIAGNOSIS — D696 Thrombocytopenia, unspecified: Secondary | ICD-10-CM | POA: Diagnosis not present

## 2022-07-11 DIAGNOSIS — D509 Iron deficiency anemia, unspecified: Secondary | ICD-10-CM | POA: Diagnosis not present

## 2022-07-11 DIAGNOSIS — N400 Enlarged prostate without lower urinary tract symptoms: Secondary | ICD-10-CM | POA: Diagnosis not present

## 2022-07-11 DIAGNOSIS — C787 Secondary malignant neoplasm of liver and intrahepatic bile duct: Secondary | ICD-10-CM | POA: Diagnosis not present

## 2022-07-11 LAB — CBC WITH DIFFERENTIAL (CANCER CENTER ONLY)
Abs Immature Granulocytes: 0.04 10*3/uL (ref 0.00–0.07)
Basophils Absolute: 0.1 10*3/uL (ref 0.0–0.1)
Basophils Relative: 1 %
Eosinophils Absolute: 0.2 10*3/uL (ref 0.0–0.5)
Eosinophils Relative: 2 %
HCT: 39.8 % (ref 39.0–52.0)
Hemoglobin: 13.3 g/dL (ref 13.0–17.0)
Immature Granulocytes: 0 %
Lymphocytes Relative: 17 %
Lymphs Abs: 2 10*3/uL (ref 0.7–4.0)
MCH: 36.2 pg — ABNORMAL HIGH (ref 26.0–34.0)
MCHC: 33.4 g/dL (ref 30.0–36.0)
MCV: 108.4 fL — ABNORMAL HIGH (ref 80.0–100.0)
Monocytes Absolute: 1.1 10*3/uL — ABNORMAL HIGH (ref 0.1–1.0)
Monocytes Relative: 9 %
Neutro Abs: 8.3 10*3/uL — ABNORMAL HIGH (ref 1.7–7.7)
Neutrophils Relative %: 71 %
Platelet Count: 91 10*3/uL — ABNORMAL LOW (ref 150–400)
RBC: 3.67 MIL/uL — ABNORMAL LOW (ref 4.22–5.81)
RDW: 20.7 % — ABNORMAL HIGH (ref 11.5–15.5)
WBC Count: 11.7 10*3/uL — ABNORMAL HIGH (ref 4.0–10.5)
nRBC: 0 % (ref 0.0–0.2)

## 2022-07-11 LAB — CMP (CANCER CENTER ONLY)
ALT: 38 U/L (ref 0–44)
AST: 41 U/L (ref 15–41)
Albumin: 2.3 g/dL — ABNORMAL LOW (ref 3.5–5.0)
Alkaline Phosphatase: 188 U/L — ABNORMAL HIGH (ref 38–126)
Anion gap: 9 (ref 5–15)
BUN: 38 mg/dL — ABNORMAL HIGH (ref 8–23)
CO2: 25 mmol/L (ref 22–32)
Calcium: 8.1 mg/dL — ABNORMAL LOW (ref 8.9–10.3)
Chloride: 104 mmol/L (ref 98–111)
Creatinine: 2.19 mg/dL — ABNORMAL HIGH (ref 0.61–1.24)
GFR, Estimated: 30 mL/min — ABNORMAL LOW (ref >60)
Glucose, Bld: 137 mg/dL — ABNORMAL HIGH (ref 70–99)
Potassium: 4.8 mmol/L (ref 3.5–5.1)
Sodium: 138 mmol/L (ref 135–145)
Total Bilirubin: 2.8 mg/dL — ABNORMAL HIGH (ref 0.3–1.2)
Total Protein: 5.6 g/dL — ABNORMAL LOW (ref 6.5–8.1)

## 2022-07-11 LAB — BRAIN NATRIURETIC PEPTIDE: B Natriuretic Peptide: 641.2 pg/mL — ABNORMAL HIGH (ref 0.0–100.0)

## 2022-07-11 MED ORDER — MIRTAZAPINE 7.5 MG PO TABS: 7.5000 mg | ORAL_TABLET | Freq: Every day | ORAL | 2 refills | Status: DC

## 2022-07-11 MED ORDER — LOPERAMIDE HCL 2 MG PO CAPS: 4.0000 mg | ORAL_CAPSULE | Freq: Two times a day (BID) | ORAL | 3 refills | Status: DC

## 2022-07-11 MED ORDER — FUROSEMIDE 40 MG PO TABS: 20.0000 mg | ORAL_TABLET | Freq: Every day | ORAL | 1 refills | Status: DC

## 2022-07-11 MED ORDER — DIPHENOXYLATE-ATROPINE 2.5-0.025 MG PO TABS: 1.0000 | ORAL_TABLET | Freq: Two times a day (BID) | ORAL | 1 refills | Status: DC
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
## 2022-07-11 NOTE — Progress Notes (Signed)
Patient states that he has a lot of diarrhea and he is taking imodium for it.patient states that the chemo is affecting his voice.

## 2022-07-12 ENCOUNTER — Other Ambulatory Visit (HOSPITAL_COMMUNITY): Payer: Self-pay

## 2022-07-12 ENCOUNTER — Other Ambulatory Visit: Payer: Self-pay

## 2022-07-12 ENCOUNTER — Encounter: Payer: Self-pay | Admitting: Internal Medicine

## 2022-07-12 MED ORDER — STIVARGA 40 MG PO TABS
120.0000 mg | ORAL_TABLET | Freq: Every day | ORAL | 0 refills | Status: DC
Start: 2022-07-12 — End: 2022-09-23
  Filled 2022-07-12: qty 63, 28d supply, fill #0

## 2022-07-15 ENCOUNTER — Other Ambulatory Visit (HOSPITAL_COMMUNITY): Payer: Self-pay

## 2022-07-28 NOTE — Progress Notes (Signed)
"  Triglycerides, Body fluid " wasOrdered as part of work up for causes of ascites.  GB

## 2022-07-29 ENCOUNTER — Inpatient Hospital Stay: Payer: Medicare HMO

## 2022-07-29 ENCOUNTER — Inpatient Hospital Stay (HOSPITAL_BASED_OUTPATIENT_CLINIC_OR_DEPARTMENT_OTHER): Payer: Medicare HMO | Admitting: Hospice and Palliative Medicine

## 2022-07-29 ENCOUNTER — Inpatient Hospital Stay: Payer: Medicare HMO | Admitting: Internal Medicine

## 2022-07-29 ENCOUNTER — Inpatient Hospital Stay: Payer: Medicare HMO | Attending: Internal Medicine

## 2022-07-29 ENCOUNTER — Telehealth: Payer: Self-pay | Admitting: *Deleted

## 2022-07-29 ENCOUNTER — Other Ambulatory Visit: Payer: Self-pay

## 2022-07-29 ENCOUNTER — Encounter: Payer: Self-pay | Admitting: Hospice and Palliative Medicine

## 2022-07-29 VITALS — BP 130/84 | HR 77 | Temp 98.2°F | Resp 18 | Ht 67.0 in | Wt 138.0 lb

## 2022-07-29 DIAGNOSIS — R197 Diarrhea, unspecified: Secondary | ICD-10-CM

## 2022-07-29 DIAGNOSIS — R918 Other nonspecific abnormal finding of lung field: Secondary | ICD-10-CM

## 2022-07-29 DIAGNOSIS — Z79899 Other long term (current) drug therapy: Secondary | ICD-10-CM | POA: Diagnosis not present

## 2022-07-29 DIAGNOSIS — C20 Malignant neoplasm of rectum: Secondary | ICD-10-CM | POA: Diagnosis not present

## 2022-07-29 DIAGNOSIS — N179 Acute kidney failure, unspecified: Secondary | ICD-10-CM | POA: Diagnosis not present

## 2022-07-29 DIAGNOSIS — R319 Hematuria, unspecified: Secondary | ICD-10-CM

## 2022-07-29 DIAGNOSIS — E860 Dehydration: Secondary | ICD-10-CM

## 2022-07-29 DIAGNOSIS — C7800 Secondary malignant neoplasm of unspecified lung: Secondary | ICD-10-CM | POA: Diagnosis not present

## 2022-07-29 LAB — CBC WITH DIFFERENTIAL (CANCER CENTER ONLY)
Abs Immature Granulocytes: 0.04 10*3/uL (ref 0.00–0.07)
Basophils Absolute: 0 10*3/uL (ref 0.0–0.1)
Basophils Relative: 1 %
Eosinophils Absolute: 0 10*3/uL (ref 0.0–0.5)
Eosinophils Relative: 0 %
HCT: 38.4 % — ABNORMAL LOW (ref 39.0–52.0)
Hemoglobin: 12.9 g/dL — ABNORMAL LOW (ref 13.0–17.0)
Immature Granulocytes: 1 %
Lymphocytes Relative: 18 %
Lymphs Abs: 1.2 10*3/uL (ref 0.7–4.0)
MCH: 35.8 pg — ABNORMAL HIGH (ref 26.0–34.0)
MCHC: 33.6 g/dL (ref 30.0–36.0)
MCV: 106.7 fL — ABNORMAL HIGH (ref 80.0–100.0)
Monocytes Absolute: 0.5 10*3/uL (ref 0.1–1.0)
Monocytes Relative: 7 %
Neutro Abs: 4.9 10*3/uL (ref 1.7–7.7)
Neutrophils Relative %: 73 %
Platelet Count: 72 10*3/uL — ABNORMAL LOW (ref 150–400)
RBC: 3.6 MIL/uL — ABNORMAL LOW (ref 4.22–5.81)
RDW: 18.9 % — ABNORMAL HIGH (ref 11.5–15.5)
WBC Count: 6.7 10*3/uL (ref 4.0–10.5)
nRBC: 0 % (ref 0.0–0.2)

## 2022-07-29 LAB — CMP (CANCER CENTER ONLY)
ALT: 27 U/L (ref 0–44)
AST: 49 U/L — ABNORMAL HIGH (ref 15–41)
Albumin: 1.9 g/dL — ABNORMAL LOW (ref 3.5–5.0)
Alkaline Phosphatase: 160 U/L — ABNORMAL HIGH (ref 38–126)
Anion gap: 10 (ref 5–15)
BUN: 46 mg/dL — ABNORMAL HIGH (ref 8–23)
CO2: 21 mmol/L — ABNORMAL LOW (ref 22–32)
Calcium: 7.5 mg/dL — ABNORMAL LOW (ref 8.9–10.3)
Chloride: 99 mmol/L (ref 98–111)
Creatinine: 2.62 mg/dL — ABNORMAL HIGH (ref 0.61–1.24)
GFR, Estimated: 24 mL/min — ABNORMAL LOW (ref >60)
Glucose, Bld: 140 mg/dL — ABNORMAL HIGH (ref 70–99)
Potassium: 4.6 mmol/L (ref 3.5–5.1)
Sodium: 130 mmol/L — ABNORMAL LOW (ref 135–145)
Total Bilirubin: 2.1 mg/dL — ABNORMAL HIGH (ref 0.3–1.2)
Total Protein: 4.9 g/dL — ABNORMAL LOW (ref 6.5–8.1)

## 2022-07-29 LAB — MAGNESIUM: Magnesium: 1.8 mg/dL (ref 1.7–2.4)

## 2022-07-29 LAB — BRAIN NATRIURETIC PEPTIDE: B Natriuretic Peptide: 857.1 pg/mL — ABNORMAL HIGH (ref 0.0–100.0)

## 2022-07-29 MED ORDER — SODIUM CHLORIDE 0.9% FLUSH
10.0000 mL | Freq: Once | INTRAVENOUS | Status: AC
  Administered 2022-07-29: 10 mL via INTRAVENOUS
  Filled 2022-07-29: qty 10

## 2022-07-29 MED ORDER — SODIUM CHLORIDE 0.9 % IV SOLN
INTRAVENOUS | Status: DC
  Filled 2022-07-29 (×2): qty 250

## 2022-07-29 MED ORDER — HEPARIN SOD (PORK) LOCK FLUSH 100 UNIT/ML IV SOLN
500.0000 [IU] | Freq: Once | INTRAVENOUS | Status: AC
  Administered 2022-07-29: 500 [IU] via INTRAVENOUS
  Filled 2022-07-29: qty 5

## 2022-07-29 NOTE — Telephone Encounter (Signed)
Call from [facility] reporting that patient has an appointment today that he said he cannot make due to diarrhea, he is very weak and she does not know what to do for him. She is asking for our advice. Patient is having incontinence and the medicine ( Lomotil and Imodium both) he is taking is not helping. He is not eating or drinking much at all. He is requiring assistance to get up now. He is refusing to go to the hospital. He has had 4 stools this morning which are liquid.

## 2022-07-29 NOTE — Progress Notes (Signed)
Symptom Management Clinic [facility] [phone] [phone]  Patient Care Team: Eustaquio Boyden, MD as PCP - General (Family Medicine) Benita Gutter, RN as Oncology Nurse Navigator Earna Coder, MD as Consulting Physician (Oncology)   NAME OF PATIENT: Albert Giles  [id number]  26-Feb-1945   DATE OF VISIT: 07/29/22  REASON FOR CONSULT: Albert Giles is a 77 y.o. male with multiple medical problems including stage IV rectal cancer with pulmonary metastasis, currently on treatment with Stivarga.   INTERVAL HISTORY: Patient presents to clinic for evaluation of diarrhea.  He says that he has had diarrhea for weeks to months, which has been persistent with several loose, watery stools this morning.  Denies bleeding.  Denies fever or chills.  No abdominal pain.  No nausea or vomiting.  No other symptomatic complaints.  Of note, patient has Lomotil and Imodium ordered but he states that he has not been taking those due to fear that it will cause constipation.  Denies urinary complaints. Patient offers no further specific complaints today.   PAST MEDICAL HISTORY: Past Medical History:  Diagnosis Date   CAD (coronary artery disease) 06/2014   UA with NSTEMI - cath with 99% prox L circ s/p stent, EF 40% ([place], [place] [facility])   Emphysema    mild   Ex-smoker    Family history of breast cancer    Family history of colon cancer    Family history of pancreatic cancer    History of COVID-19    NSTEMI (non-ST elevated myocardial infarction) (HCC) 07/13/2014   Rectal cancer (HCC)     PAST SURGICAL HISTORY:  Past Surgical History:  Procedure Laterality Date   CARDIAC CATHETERIZATION N/A 07/14/2014   Left Heart Cath and Coronary Angiography with stent placement;  Surgeon: Dalia Heading, MD   CARDIAC CATHETERIZATION N/A 07/14/2014   Coronary Stent Intervention;  Surgeon: Alwyn Pea, MD   COLONOSCOPY WITH  PROPOFOL N/A 09/06/2020   Procedure: COLONOSCOPY WITH PROPOFOL;  Surgeon: Toney Reil, MD;  Location: [facility] ENDOSCOPY;  Service: Gastroenterology;  Laterality: N/A;   CYSTECTOMY     on back   ESOPHAGOGASTRODUODENOSCOPY N/A 09/06/2020   Procedure: ESOPHAGOGASTRODUODENOSCOPY (EGD);  Surgeon: Toney Reil, MD;  Location: [facility] ENDOSCOPY;  Service: Gastroenterology;  Laterality: N/A;   ESOPHAGOGASTRODUODENOSCOPY N/A 06/29/2021   Procedure: ESOPHAGOGASTRODUODENOSCOPY (EGD);  Surgeon: Toney Reil, MD;  Location: [facility] ENDOSCOPY;  Service: Gastroenterology;  Laterality: N/A;   FLEXIBLE SIGMOIDOSCOPY N/A 12/20/2021   Procedure: FLEXIBLE SIGMOIDOSCOPY;  Surgeon: Toney Reil, MD;  Location: [facility] ENDOSCOPY;  Service: Gastroenterology;  Laterality: N/A;   INGUINAL HERNIA REPAIR Right 08/17/04   IR IMAGING GUIDED PORT INSERTION  09/13/2020    HEMATOLOGY/ONCOLOGY HISTORY:  Oncology History Overview Note  # Malignant partially obstructing tumor in the transverse colon/70 cm proximal to the anus- C. COLON MASS, 70 CM; COLD BIOPSY:  - INTRAMUCOSAL ADENOCARCINOMA AT LEAST;  # One 20 mm polyp in the transverse colon, removed with mucosal resection. Resected and retrieved. Clips were placed. Tattooed.  # tumor in the mid rectum and at 10 cm proximal to the anus. Biopsied.    SEE COMMENT.   Comment:  There is no definitive evidence of invasion in this sample.  The  findings may not accurately represent the entire underlying lesion;  clinical correlation is recommended.   D. COLON POLYP, TRANSVERSE; HOT SNARE:  - TUBULOVILLOUS ADENOMA.  - NEGATIVE FOR HIGH GRADE DYSPLASIA AND MALIGNANCY.  E. RECTUM MASS; COLD BIOPSY:  - INVASIVE ADENOCARCINOMA, MODERATELY TO POORLY DIFFERENTIATED. ----------------------------------   # PET scan: SEP 2022-proximal right colon [adjacent mesenteric lymph nodes] and rectal hypermetabolism.  Additional subcentimeter bilateral hypermetabolic  lymphadenopathy noted in the retroperitoneal/left external iliac; inferior right lobe of the liver concerning for metastatic disease; right lower quadrant soft tissue mass concerning for peritoneal carcinomatosis; bilateral solid pulmonary nodules ~5 mm; MRI rectum- T stage: T4a; N stage:  N1.   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  s/p FOLFIRI + M-vasi #9. DISCONTINUED sec to poor tolerance.   # March 18th 2024- start rego 2 pills/day-  80 mg/ day x cycle #1 [finish on 4/07]- cycle #1- and the increase to 120 mg cycle #2 [start 4/15]-   # DEC 2023- Dr.Vanga- Flexible sigmoidoscopy with Dr. Allegra Lai.  No progressive rectal cancer noted; friable rectal mucosa      Rectal cancer (HCC)  09/11/2020 Initial Diagnosis   Rectal cancer (HCC)   09/25/2020 - 08/23/2021 Chemotherapy   Patient is on Treatment Plan : COLORECTAL FOLFOX q14d x 4 months     09/28/2020 Cancer Staging   Staging form: Colon and Rectum, AJCC 8th Edition - Clinical: Stage IVC (cT4a, cN1, cM1c) - Signed by Earna Coder, MD on 09/28/2020    Genetic Testing   Negative genetic testing. No pathogenic variants identified on the [facility] Multi-Cancer+RNA Panel. The report date is 11/05/2020.  The Multi-Cancer Panel + RNA offered by [facility] includes sequencing and/or deletion duplication testing of the following 84  genes: AIP, ALK, APC, ATM, AXIN2,BAP1,  BARD1, BLM, BMPR1A, BRCA1, BRCA2, BRIP1, CASR, CDC73, CDH1, CDK4, CDKN1B, CDKN1C, CDKN2A (p14ARF), CDKN2A (p16INK4a), CEBPA, CHEK2, CTNNA1, DICER1, DIS3L2, EGFR (c.2369C>T, p.Thr790Met variant only), EPCAM (Deletion/duplication testing only), FH, FLCN, GATA2, GPC3, GREM1 (Promoter region deletion/duplication testing only), HOXB13 (c.251G>A, p.Gly84Glu), HRAS, KIT, MAX, MEN1, MET, MITF (c.952G>A, p.Glu318Lys variant only), MLH1, MSH2, MSH3, MSH6, MUTYH, NBN, NF1, NF2, NTHL1, PALB2, PDGFRA, PHOX2B, PMS2, POLD1, POLE, POT1, PRKAR1A, PTCH1, PTEN, RAD50, RAD51C, RAD51D, RB1, RECQL4, RET, RUNX1, SDHAF2, SDHA (sequence changes only), SDHB, SDHC, SDHD, SMAD4, SMARCA4, SMARCB1, SMARCE1, STK11, SUFU, TERC, TERT, TMEM127, TP53, TSC1, TSC2, VHL, WRN and WT1.   09/04/2021 -  Chemotherapy   Patient is on Treatment Plan : COLORECTAL FOLFIRI + Bevacizumab q14d       ALLERGIES:  has No Known Allergies.  MEDICATIONS:  Current Outpatient Medications  Medication Sig Dispense Refill   aspirin EC 81 MG tablet Take 1 tablet (81 mg total) by mouth daily. 60 tablet 1   Cholecalciferol (VITAMIN D3) 25 MCG (1000 UT) CAPS Take 1 capsule (1,000 Units total) by mouth daily. 30 capsule    Cyanocobalamin (B-12) 1000 MCG SUBL Place 1 tablet under the tongue daily.     diphenoxylate-atropine (LOMOTIL) 2.5-0.025 MG tablet Take 1 tablet by mouth in the morning and at bedtime. Take it along with imodium for diarrhea. Hold medication if constipation occurs. 120 tablet 1  ferrous sulfate 324 (65 Fe) MG TBEC Take 1 tablet (325 mg total) by mouth every other day.     furosemide (LASIX) 40 MG tablet Take 0.5 tablets (20 mg total) by mouth daily. Take it in AM. For leg swelling 60 tablet 1   lidocaine-prilocaine (EMLA) cream Apply on the port. 30 -45 min  prior to port access. 30 g 3   loperamide (IMODIUM) 2 MG capsule Take 2 capsules (4 mg total) by mouth 2 (two) times daily. For diarrhea. Can take  with Lomotil. 120 capsule 3   metoprolol tartrate (LOPRESSOR) 25 MG tablet Take 1 tablet (25 mg total) by mouth 2 (two) times daily. 60 tablet 1   mirtazapine (REMERON) 7.5 MG tablet Take 1 tablet (7.5 mg total) by mouth at bedtime. For weight gain, sleep, anxiety. 30 tablet 2   pantoprazole (PROTONIX) 40 MG tablet TAKE 1 TABLET BY MOUTH TWICE DAILY BEFORE A MEAL 60 tablet 11   regorafenib (STIVARGA) 40 MG tablet Take 3 tablets (120 mg total) by mouth daily with breakfast. Take with low fat meal. Take for 21 days, then hold for 7 days. Repeat every 28 days. 63 tablet 0   atorvastatin (LIPITOR) 40 MG tablet Take 40 mg by mouth daily. (Patient not taking: Reported on 02/25/2022)     magic mouthwash (nystatin, lidocaine, diphenhydrAMINE, alum & mag hydroxide) suspension Swish and spit 5 mLs 4 (four) times daily as needed for mouth pain. (Patient not taking: Reported on 07/29/2022) 240 mL 1   tadalafil (CIALIS) 20 MG tablet TAKE 1 TABLET BY MOUTH ONCE DAILY AS NEEDED FOR ERECTILE DYSFUNCTION (Patient not taking: Reported on 07/29/2022) 20 tablet 0   triamcinolone ointment (KENALOG) 0.5 % Apply 1 Application topically 2 (two) times daily. (Patient not taking: Reported on 07/29/2022) 30 g 0   No current facility-administered medications for this visit.   Facility-Administered Medications Ordered in Other Visits  Medication Dose Route Frequency Provider Last Rate Last Admin   0.9 %  sodium chloride infusion   Intravenous Continuous Phyllicia Dudek, Daryl Eastern, NP 500 mL/hr at 07/29/22 1332 New Bag at 07/29/22 1332   heparin lock flush 100 unit/mL  500 Units Intravenous Once Demiah Gullickson, Ivin Booty R, NP       sodium chloride flush (NS) 0.9 % injection 10 mL  10 mL Intravenous PRN Earna Coder, MD   10 mL at 10/09/20 0855    VITAL SIGNS: BP 130/84   Pulse 77   Temp 98.2 F (36.8 C) (Oral)   Resp 18   Ht 5\' 7"  (1.702 m)   Wt 138 lb (62.6 kg)   BMI 21.61 kg/m  Filed Weights   07/29/22 1238  Weight: 138 lb  (62.6 kg)    Estimated body mass index is 21.61 kg/m as calculated from the following:   Height as of this encounter: 5\' 7"  (1.702 m).   Weight as of this encounter: 138 lb (62.6 kg).  LABS: CBC:    Component Value Date/Time   WBC 6.7 07/29/2022 1219   WBC 7.5 03/19/2022 1029   HGB 12.9 (L) 07/29/2022 1219   HGB 11.5 (L) 01/21/2022 1507   HCT 38.4 (L) 07/29/2022 1219   HCT 34.7 (L) 01/21/2022 1507   PLT 72 (L) 07/29/2022 1219   PLT 113 (L) 01/21/2022 1507   MCV 106.7 (H) 07/29/2022 1219   MCV 101 (H) 01/21/2022 1507   NEUTROABS 4.9 07/29/2022 1219   LYMPHSABS 1.2 07/29/2022 1219   MONOABS 0.5 07/29/2022 1219  EOSABS 0.0 07/29/2022 1219   BASOSABS 0.0 07/29/2022 1219   Comprehensive Metabolic Panel:    Component Value Date/Time   NA 130 (L) 07/29/2022 1219   NA 141 01/21/2022 1507   K 4.6 07/29/2022 1219   CL 99 07/29/2022 1219   CO2 21 (L) 07/29/2022 1219   BUN 46 (H) 07/29/2022 1219   BUN 16 01/21/2022 1507   CREATININE 2.62 (H) 07/29/2022 1219   GLUCOSE 140 (H) 07/29/2022 1219   CALCIUM 7.5 (L) 07/29/2022 1219   AST 49 (H) 07/29/2022 1219   ALT 27 07/29/2022 1219   ALKPHOS 160 (H) 07/29/2022 1219   BILITOT 2.1 (H) 07/29/2022 1219   PROT 4.9 (L) 07/29/2022 1219   ALBUMIN 1.9 (L) 07/29/2022 1219    RADIOGRAPHIC STUDIES: CT CHEST WO CONTRAST  Result Date: 07/09/2022 CLINICAL DATA:  Metastatic colon cancer. Follow-up treatment response. * Tracking Code: BO * EXAM: CT CHEST WITHOUT CONTRAST TECHNIQUE: Multidetector CT imaging of the chest was performed following the standard protocol without IV contrast. RADIATION DOSE REDUCTION: This exam was performed according to the departmental dose-optimization program which includes automated exposure control, adjustment of the mA and/or kV according to patient size and/or use of iterative reconstruction technique. COMPARISON:  Multiple prior imaging studies. The most recent chest CT is 02/15/2022 and PET-CT 03/27/2022  FINDINGS: Cardiovascular: The heart is normal in size. No pericardial effusion. Stable aortic and coronary artery calcifications. Mediastinum/Nodes: Scattered fluid in the pericardial recesses. Small right paratracheal nodes slightly larger when compared to the prior PET-CT. 6 mm node on image 59/2 previously measured 4 mm. Pretracheal lymph node image 64/2 measures 10 mm and previously measured 7 mm. The esophagus is grossly normal. Small hiatal hernia. Lungs/Pleura: Numerous bilateral pulmonary nodules consistent with known metastatic disease. Although difficult to accurately compare with the prior PET-CT PICC [place] of motion artifact overall there appears to be slight progression of metastatic pulmonary disease. 8 mm right upper lobe lung lesion on image 37/5 previously measured 6 mm. Right middle lobe lesion medially and anteriorly on image number 100/5 measures 9 mm and previously measured 8 mm. Right lower lobe nodule on image 109/5 measures 9 mm and previously measured 7 mm. Right lower lobe nodule on image 122/5 measures 13 mm and previously measured 11 mm. Upper Abdomen: Stable cirrhotic changes involving liver with upper abdominal ascites and splenomegaly. No hepatic adrenal lesions are identified. Musculoskeletal: No significant bony findings. IMPRESSION: 1. Numerous bilateral pulmonary nodules consistent with known metastatic disease. Although difficult to accurately compare with the prior PET-CT because of breathing motion artifact overall there appears to be slight progression as detailed above. 2. Slight interval enlargement of mediastinal lymph nodes. 3. Stable cirrhotic changes involving liver with upper abdominal ascites and splenomegaly. Aortic Atherosclerosis (ICD10-I70.0). Electronically Signed   By: Rudie Meyer M.D.   On: 07/09/2022 09:09    PERFORMANCE STATUS (ECOG) : 1 - Symptomatic but completely ambulatory  Review of Systems Unless otherwise noted, a complete review of systems is  negative.  Physical Exam General: NAD Cardiovascular: regular rate and rhythm Pulmonary: clear ant fields Abdomen: soft, nontender, + bowel sounds GU: no suprapubic tenderness Extremities: no edema, no joint deformities Skin: no rashes Neurological: Weakness but otherwise nonfocal  IMPRESSION/PLAN: Rectal cancer -on treatment with Stivarga  Diarrhea -most likely secondary to Stivarga as up to 50% of patients will experience diarrhea on this drug.  However, will send for stool studies and check C. difficile PCR.  Patient not currently taking antidiarrheals consistently.  I recommended the daughter add Lomotil to patient's pillbox.  Discussed with Dr. Donneta Romberg who advised patient holding Stivarga until symptoms improved.  AKI -likely prerenal secondary to volume loss from diarrhea.  Proceed with IV fluids today.  Will bring patient back tomorrow for repeat labs and fluids.  Patient instructed that he can hold Lasix today.  RTC tomorrow for labs/fluid clinic   Patient expressed understanding and was in agreement with this plan. He also understands that He can call clinic at any time with any questions, concerns, or complaints.   Thank you for allowing me to participate in the care of this very pleasant patient.   Time Total: 20 minutes  Visit consisted of counseling and education dealing with the complex and emotionally intense issues of symptom management in the setting of serious illness.Greater than 50%  of this time was spent counseling and coordinating care related to the above assessment and plan.  Signed by: Laurette Schimke, PhD, NP-C

## 2022-07-29 NOTE — Telephone Encounter (Signed)
Rn spoke with DR. B. Pt is on Stivarga. Per md- instructed pt's daughter to have pt hold dose of stivarga today. Pt needs smc visit today with possible octreotide. Pt will need labs drawn from port and added to fluid clinic. Daughter will call pt directly and see if pt can come by 1pm. Labs added

## 2022-07-29 NOTE — Patient Instructions (Addendum)
Take imodium and Lomotil as directed.  Return tomorrow for IV fluids and recheck of port/labs.  Return in 1 week for port lab/ see Dr. B and possible IV fluids.  Hold Lasix and Stivarga as directed

## 2022-07-30 ENCOUNTER — Encounter: Payer: Self-pay | Admitting: Internal Medicine

## 2022-07-30 ENCOUNTER — Inpatient Hospital Stay: Payer: Medicare HMO

## 2022-07-30 ENCOUNTER — Other Ambulatory Visit: Payer: Self-pay

## 2022-07-30 VITALS — BP 134/84 | HR 75 | Temp 98.6°F | Resp 20

## 2022-07-30 DIAGNOSIS — C7800 Secondary malignant neoplasm of unspecified lung: Secondary | ICD-10-CM | POA: Diagnosis not present

## 2022-07-30 DIAGNOSIS — D500 Iron deficiency anemia secondary to blood loss (chronic): Secondary | ICD-10-CM

## 2022-07-30 DIAGNOSIS — Z79899 Other long term (current) drug therapy: Secondary | ICD-10-CM | POA: Diagnosis not present

## 2022-07-30 DIAGNOSIS — C20 Malignant neoplasm of rectum: Secondary | ICD-10-CM

## 2022-07-30 DIAGNOSIS — R197 Diarrhea, unspecified: Secondary | ICD-10-CM

## 2022-07-30 DIAGNOSIS — E860 Dehydration: Secondary | ICD-10-CM

## 2022-07-30 DIAGNOSIS — N179 Acute kidney failure, unspecified: Secondary | ICD-10-CM | POA: Diagnosis not present

## 2022-07-30 LAB — CMP (CANCER CENTER ONLY)
ALT: 27 U/L (ref 0–44)
AST: 50 U/L — ABNORMAL HIGH (ref 15–41)
Albumin: 1.8 g/dL — ABNORMAL LOW (ref 3.5–5.0)
Alkaline Phosphatase: 162 U/L — ABNORMAL HIGH (ref 38–126)
Anion gap: 8 (ref 5–15)
BUN: 48 mg/dL — ABNORMAL HIGH (ref 8–23)
CO2: 22 mmol/L (ref 22–32)
Calcium: 7.4 mg/dL — ABNORMAL LOW (ref 8.9–10.3)
Chloride: 101 mmol/L (ref 98–111)
Creatinine: 2.53 mg/dL — ABNORMAL HIGH (ref 0.61–1.24)
GFR, Estimated: 25 mL/min — ABNORMAL LOW (ref >60)
Glucose, Bld: 167 mg/dL — ABNORMAL HIGH (ref 70–99)
Potassium: 4.1 mmol/L (ref 3.5–5.1)
Sodium: 131 mmol/L — ABNORMAL LOW (ref 135–145)
Total Bilirubin: 2.2 mg/dL — ABNORMAL HIGH (ref 0.3–1.2)
Total Protein: 4.6 g/dL — ABNORMAL LOW (ref 6.5–8.1)

## 2022-07-30 LAB — CBC WITH DIFFERENTIAL (CANCER CENTER ONLY)
Abs Immature Granulocytes: 0.04 10*3/uL (ref 0.00–0.07)
Basophils Absolute: 0.1 10*3/uL (ref 0.0–0.1)
Basophils Relative: 1 %
Eosinophils Absolute: 0 10*3/uL (ref 0.0–0.5)
Eosinophils Relative: 0 %
HCT: 37.5 % — ABNORMAL LOW (ref 39.0–52.0)
Hemoglobin: 12.6 g/dL — ABNORMAL LOW (ref 13.0–17.0)
Immature Granulocytes: 1 %
Lymphocytes Relative: 18 %
Lymphs Abs: 1.1 10*3/uL (ref 0.7–4.0)
MCH: 35.7 pg — ABNORMAL HIGH (ref 26.0–34.0)
MCHC: 33.6 g/dL (ref 30.0–36.0)
MCV: 106.2 fL — ABNORMAL HIGH (ref 80.0–100.0)
Monocytes Absolute: 0.4 10*3/uL (ref 0.1–1.0)
Monocytes Relative: 7 %
Neutro Abs: 4.5 10*3/uL (ref 1.7–7.7)
Neutrophils Relative %: 73 %
Platelet Count: 76 10*3/uL — ABNORMAL LOW (ref 150–400)
RBC: 3.53 MIL/uL — ABNORMAL LOW (ref 4.22–5.81)
RDW: 18.7 % — ABNORMAL HIGH (ref 11.5–15.5)
WBC Count: 6.1 10*3/uL (ref 4.0–10.5)
nRBC: 0 % (ref 0.0–0.2)

## 2022-07-30 MED ORDER — SODIUM CHLORIDE 0.9 % IV SOLN
Freq: Once | INTRAVENOUS | Status: AC
  Filled 2022-07-30: qty 250

## 2022-07-30 MED ORDER — HEPARIN SOD (PORK) LOCK FLUSH 100 UNIT/ML IV SOLN
500.0000 [IU] | Freq: Once | INTRAVENOUS | Status: AC
  Administered 2022-07-30: 500 [IU] via INTRAVENOUS
  Filled 2022-07-30: qty 5

## 2022-07-30 MED ORDER — SODIUM CHLORIDE 0.9% FLUSH
10.0000 mL | Freq: Once | INTRAVENOUS | Status: AC
  Administered 2022-07-30: 10 mL via INTRAVENOUS
  Filled 2022-07-30: qty 10

## 2022-07-30 NOTE — Progress Notes (Signed)
Pt not able to provide urine sample in clinic today. Pt returning on Thursday 7/18. Instructed pt to bring in sample then. Pt and family verbalized understanding

## 2022-07-31 ENCOUNTER — Other Ambulatory Visit: Payer: Self-pay | Admitting: *Deleted

## 2022-07-31 ENCOUNTER — Other Ambulatory Visit: Payer: Self-pay

## 2022-07-31 DIAGNOSIS — C20 Malignant neoplasm of rectum: Secondary | ICD-10-CM

## 2022-07-31 DIAGNOSIS — R197 Diarrhea, unspecified: Secondary | ICD-10-CM

## 2022-08-01 ENCOUNTER — Other Ambulatory Visit: Payer: Self-pay

## 2022-08-01 ENCOUNTER — Inpatient Hospital Stay: Payer: Medicare HMO

## 2022-08-01 ENCOUNTER — Telehealth: Payer: Self-pay | Admitting: Internal Medicine

## 2022-08-01 ENCOUNTER — Telehealth: Payer: Self-pay | Admitting: *Deleted

## 2022-08-01 NOTE — Telephone Encounter (Signed)
I called and spoke with patient's daughter.  She reports that patient feels weak today and is not interested in coming to clinic for labs or fluids.  However, she says that his appetite has improved.  I explained the rationale of keeping a close eye on labs given AKI and dehydration.  Daughter states that she understands.  She does not think the patient would want or need to go the hospital.  I offered to reschedule clinic visit for tomorrow and I could hear patient loudly stating in the background "no".  Daughter says the patient does not want to leave the home.  I did encourage patient to keep clinic visits as scheduled for next week.  We also reviewed ED triggers in detail.

## 2022-08-01 NOTE — Telephone Encounter (Signed)
Daughter called reporting that patient was too weak to come in for IV fluids today and she is asking for a return call to discuss what can be done for him. Please return her call

## 2022-08-01 NOTE — Telephone Encounter (Signed)
This patient's daughter called and said to cancel his appt today for port/flush labs and fluid clinic @ 930 am. Team was notified.

## 2022-08-02 ENCOUNTER — Telehealth: Payer: Self-pay | Admitting: *Deleted

## 2022-08-02 NOTE — Telephone Encounter (Signed)
Daughter Albert Giles called reporting that his diarrhea has stopped and is asking if she is to sop the diarrhea medicine. I advised hat she stop it. She said he has not had diarrhea for a couple of days and las time they stopped t, he had a ig blow out. Please advise if he should continue taking it or stop

## 2022-08-05 ENCOUNTER — Other Ambulatory Visit: Payer: Self-pay | Admitting: Gastroenterology

## 2022-08-06 ENCOUNTER — Inpatient Hospital Stay: Payer: Medicare HMO

## 2022-08-06 ENCOUNTER — Inpatient Hospital Stay: Payer: Medicare HMO | Admitting: Internal Medicine

## 2022-08-06 ENCOUNTER — Other Ambulatory Visit: Payer: Medicare HMO

## 2022-08-07 ENCOUNTER — Other Ambulatory Visit (HOSPITAL_COMMUNITY): Payer: Self-pay

## 2022-08-07 ENCOUNTER — Telehealth: Payer: Self-pay | Admitting: *Deleted

## 2022-08-07 ENCOUNTER — Encounter: Payer: Self-pay | Admitting: Internal Medicine

## 2022-08-07 NOTE — Telephone Encounter (Signed)
Called lab-path dept. Cytology from 06/26/22- is still pending. Cyto marked collected, but not resulted. Tech will check with cyto and get back with our dept. I will review chart tomorrow and Friday to f/u on result.

## 2022-08-14 ENCOUNTER — Encounter: Payer: Self-pay | Admitting: Internal Medicine

## 2022-08-14 NOTE — Progress Notes (Signed)
I spoke to the daughter offered my condolences on the patient's passing away.  Thankful for the call.  GB

## 2022-08-15 DEATH — deceased

## 2022-08-22 ENCOUNTER — Encounter: Payer: Self-pay | Admitting: Internal Medicine

## 2023-07-24 IMAGING — CT NM PET TUM IMG INITIAL (PI) SKULL BASE T - THIGH
8 of 9 series · 20 of 25 positions shown · non-contrast
Comparison: None.
COMPARISON: None.

Addendum:
CLINICAL DATA: Initial treatment strategy for colorectal cancer.

EXAM:
NUCLEAR MEDICINE PET SKULL BASE TO THIGH
TECHNIQUE: 8.65 mCi F-18 FDG was injected intravenously. Full-ring PET imaging
was performed from the skull base to thigh after the radiotracer. CT
data was obtained and used for attenuation correction and anatomic
localization.
Fasting blood glucose: 92 mg/dl

[Series 3: ct wb 5.0 b30f · axial · 5.0mm · 0.98mm/px · z∈[-343,+149]mm · 2 of 329 slices shown]
[im 1/329]
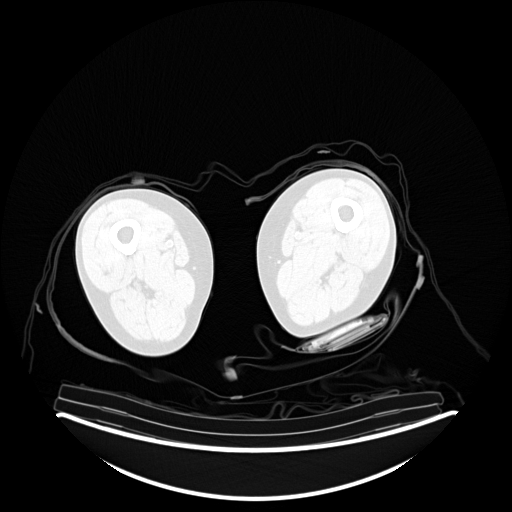
[im 165/329]
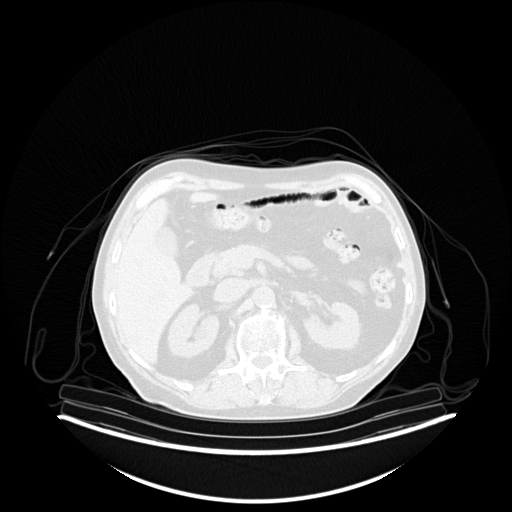

[Series 5: pet wb uncorrected (nac) · axial · 5.0mm · 4.07mm/px · z∈[-343,+641]mm · 4 of 329 slices shown]
[im 1/329]
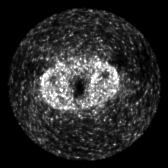
[im 110/329]
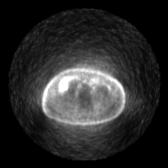
[im 219/329]
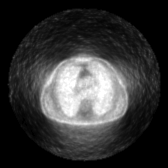
[im 329/329]
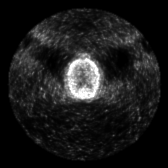

[Series 6: pet wb (ac) · axial · 5.0mm · 2.72mm/px · z∈[-16,+641]mm · 3 of 329 slices shown]
[im 110/329]
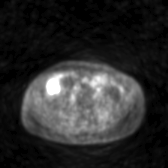
[im 219/329]
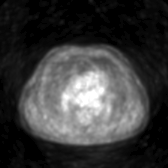
[im 329/329]
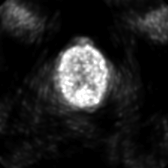

[Series 603: pet axial fused · 3 of 329 slices shown]
[im 1/329]
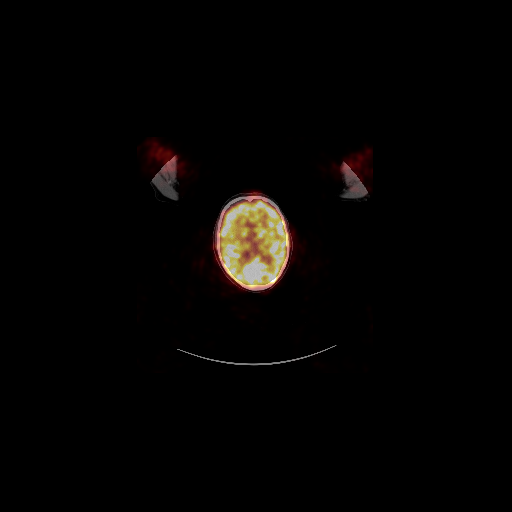
[im 219/329]
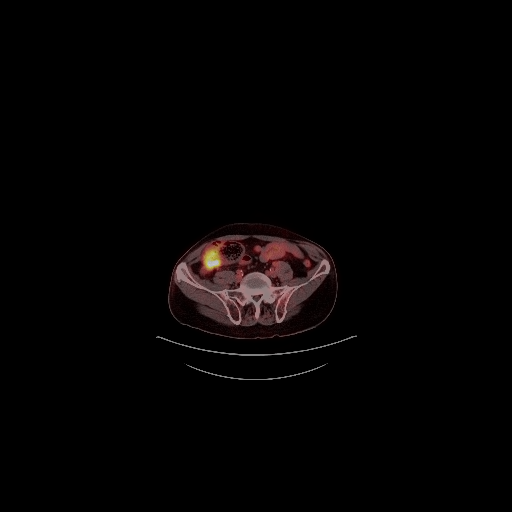
[im 329/329]
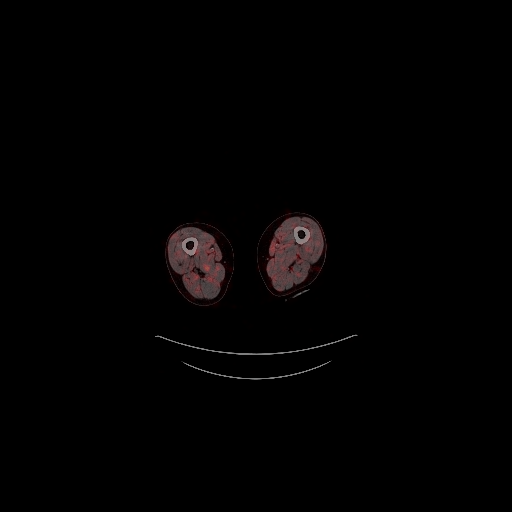

[Series 604: pet coronal fused · 1 of 96 slices shown]
[im 1/96]
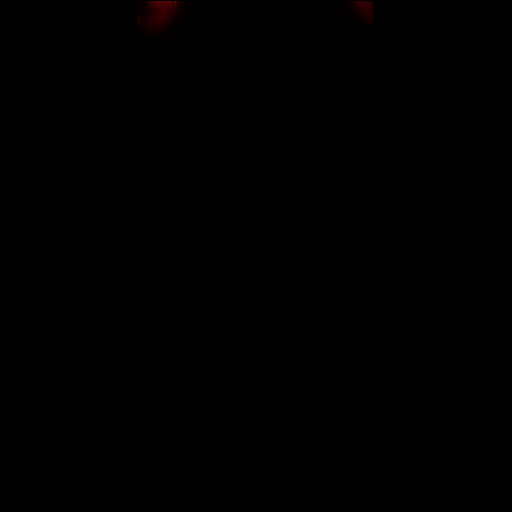

[Series 605: pet sagittal fused · 1 of 148 slices shown]
[im 1/148]
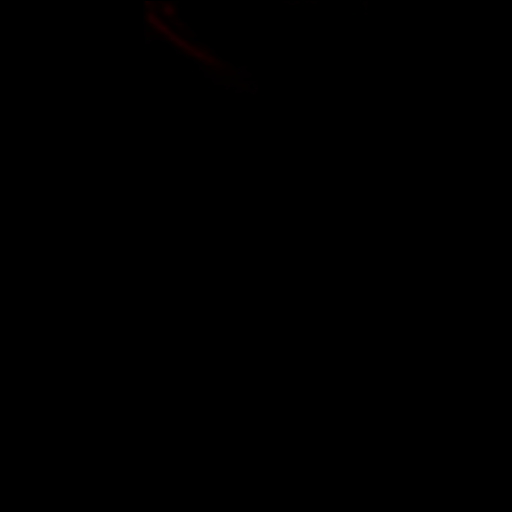

[Series 606: pet axial · 4 of 329 slices shown]
[im 1/329]
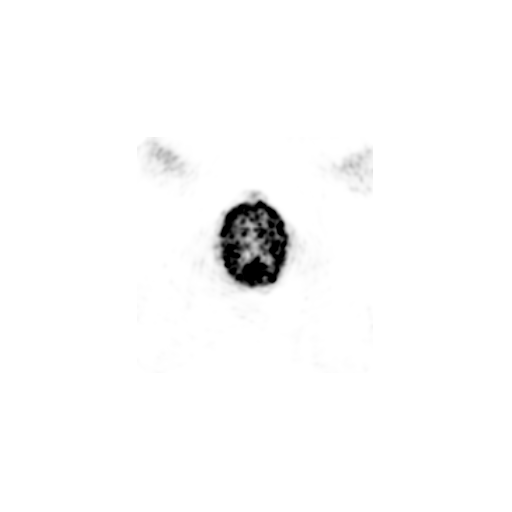
[im 110/329]
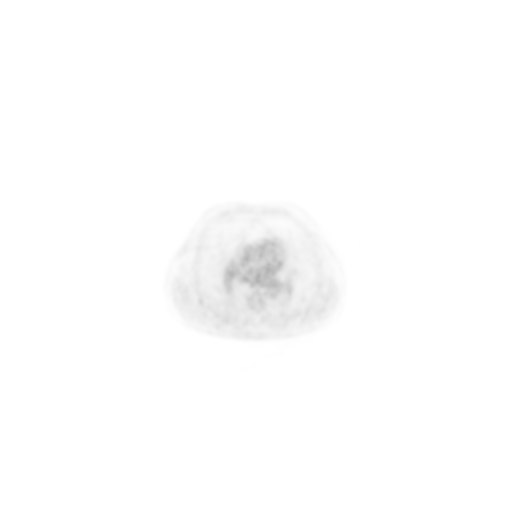
[im 219/329]
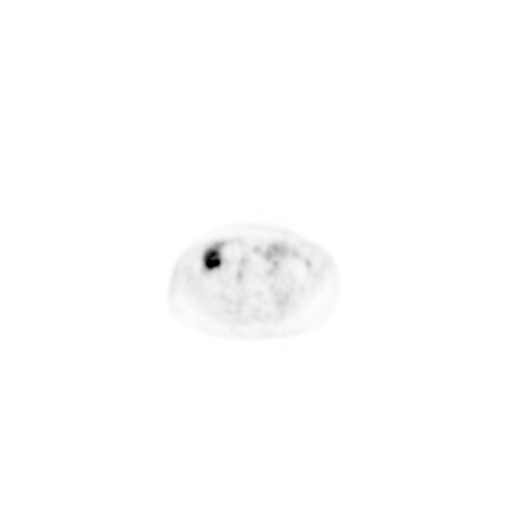
[im 329/329]
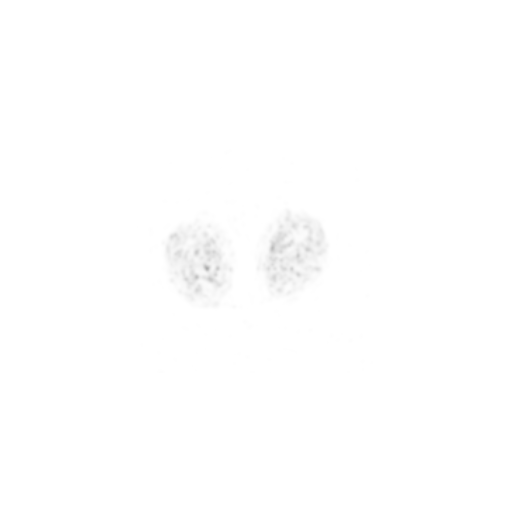

[Series 608: pet sagittal · 2 of 152 slices shown]
[im 1/152]
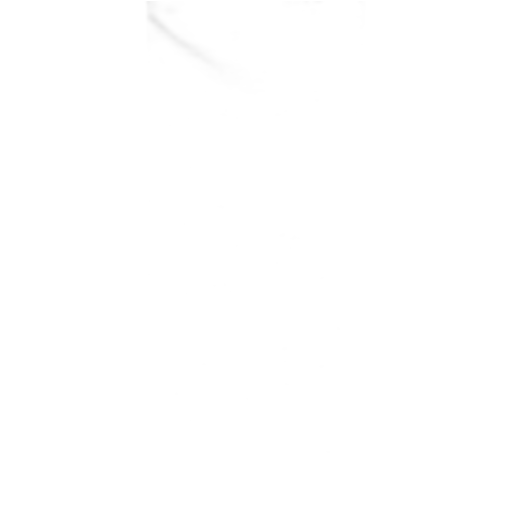
[im 152/152]
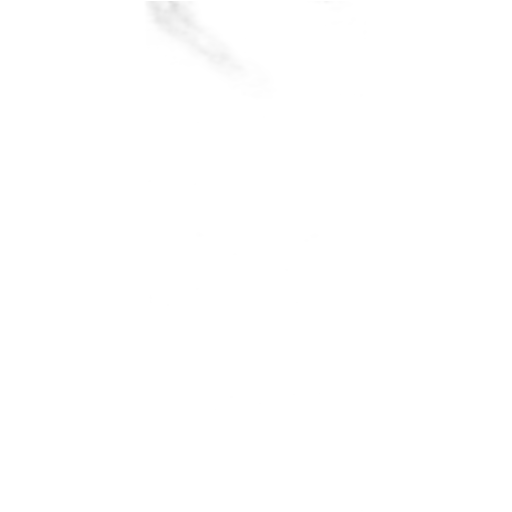

[20 of 25 positions shown; findings below may reference images not displayed]

FINDINGS: Mediastinal blood pool activity: SUV max

Liver activity: SUV max NA

NECK: No hypermetabolic lymph nodes in the neck.

Incidental CT findings: none

CHEST: Right lower paratracheal lymph node measuring 8 mm short axis
located on series 3, image 98 with SUV max of 2.7, slightly above
mediastinal blood pool, possibly reactive. Otherwise, no FDG avid
lymph node seen in the chest.

Incidental CT findings: Three-vessel coronary artery calcifications
atherosclerotic disease of the thoracic aorta. Small hiatal hernia.
Upper lobe predominant centrilobular emphysema scattered solid
pulmonary nodules. Reference solid nodule of the left upper lobe
measuring 5 mm on series 3, image 115. Reference solid nodule of the
left upper lobe measuring 5 mm on series 3, image 123. Bibasilar
atelectasis.

ABDOMEN/PELVIS: Ill-defined FDG avid lesion of the inferior right
hepatic lobe (segment 6) measuring approximately 1.0 cm on series 3,
image 169 with an SUV max of 4.8. Hypermetabolic retroperitoneal
lymph nodes. Reference left periaortic lymph node measures 7 mm in
short axis on series 3, image 176 with an SUV max of 5.3.
Hypermetabolic left external iliac lymph node measuring 7 mm in
short axis on image 231 with an SUV max of 3.7. FDG avid mesenteric
lymph nodes of the right right lower quadrant. Reference right lower
quadrant mesenteric lymph node measuring 8 mm in short axis on image
207 with an SUV max of 2.1. Left lower quadrant soft tissue mass
measuring 3.0 x 2.3 cm on image 183 with an SUV max of 5.3. Focal
wall thickening of the proximal right colon with marked FDG uptake
with SUV max of 11.8. Focal wall thickening of the rectum with SUV
max of 11.5.

Incidental CT findings: Exophytic simple cyst of the right kidney.
Atherosclerotic disease of the abdominal aorta

SKELETON: No focal hypermetabolic activity to suggest skeletal
metastasis.

Incidental CT findings: none
IMPRESSION: Focal wall thickening of the proximal right colon and rectum with
hypermetabolic activity. Hypermetabolic mesenteric lymph nodes are
seen adjacent to right colonic wall thickening.

Additional hypermetabolic retroperitoneal and left external iliac
lymph nodes are seen.

Lesion of the inferior right lobe of the liver with hypermetabolic
activity, concerning for hepatic metastatic disease.

Right lower quadrant soft tissue mass with hypermetabolic activity,
concerning for peritoneal carcinomatosis.

Small bilateral solid pulmonary nodules with no FDG uptake, possibly
due to small size. Pulmonary metastases can not be excluded.
Recommend attention on follow-up.

ADDENDUM:
Corrected report: Original report was generated with an error,
corrected as follows: Soft tissue mass measuring 3.0 x 2.3 cm on
image 183 with an SUV max of 5.3 located in the RIGHT hemiabdomen,
concerning for peritoneal carcinomatosis.

*** End of Addendum ***
FINDINGS: Mediastinal blood pool activity: SUV max

Liver activity: SUV max NA

NECK: No hypermetabolic lymph nodes in the neck.

Incidental CT findings: none

CHEST: Right lower paratracheal lymph node measuring 8 mm short axis
located on series 3, image 98 with SUV max of 2.7, slightly above
mediastinal blood pool, possibly reactive. Otherwise, no FDG avid
lymph node seen in the chest.

Incidental CT findings: Three-vessel coronary artery calcifications
atherosclerotic disease of the thoracic aorta. Small hiatal hernia.
Upper lobe predominant centrilobular emphysema scattered solid
pulmonary nodules. Reference solid nodule of the left upper lobe
measuring 5 mm on series 3, image 115. Reference solid nodule of the
left upper lobe measuring 5 mm on series 3, image 123. Bibasilar
atelectasis.

ABDOMEN/PELVIS: Ill-defined FDG avid lesion of the inferior right
hepatic lobe (segment 6) measuring approximately 1.0 cm on series 3,
image 169 with an SUV max of 4.8. Hypermetabolic retroperitoneal
lymph nodes. Reference left periaortic lymph node measures 7 mm in
short axis on series 3, image 176 with an SUV max of 5.3.
Hypermetabolic left external iliac lymph node measuring 7 mm in
short axis on image 231 with an SUV max of 3.7. FDG avid mesenteric
lymph nodes of the right right lower quadrant. Reference right lower
quadrant mesenteric lymph node measuring 8 mm in short axis on image
207 with an SUV max of 2.1. Left lower quadrant soft tissue mass
measuring 3.0 x 2.3 cm on image 183 with an SUV max of 5.3. Focal
wall thickening of the proximal right colon with marked FDG uptake
with SUV max of 11.8. Focal wall thickening of the rectum with SUV
max of 11.5.

Incidental CT findings: Exophytic simple cyst of the right kidney.
Atherosclerotic disease of the abdominal aorta

SKELETON: No focal hypermetabolic activity to suggest skeletal
metastasis.

Incidental CT findings: none
IMPRESSION: Focal wall thickening of the proximal right colon and rectum with
hypermetabolic activity. Hypermetabolic mesenteric lymph nodes are
seen adjacent to right colonic wall thickening.

Additional hypermetabolic retroperitoneal and left external iliac
lymph nodes are seen.

Lesion of the inferior right lobe of the liver with hypermetabolic
activity, concerning for hepatic metastatic disease.

Right lower quadrant soft tissue mass with hypermetabolic activity,
concerning for peritoneal carcinomatosis.

Small bilateral solid pulmonary nodules with no FDG uptake, possibly
due to small size. Pulmonary metastases can not be excluded.
Recommend attention on follow-up.

## 2023-10-29 IMAGING — CT CT CHEST-ABD-PELV W/ CM
2 of 5 series · 12 of 36 positions shown, 14 images · IV contrast (omnipaque)
Comparison: PET-CT on 09/21/2020

CLINICAL DATA: Follow-up colorectal carcinoma. Undergoing
chemotherapy.

EXAM:
CT CHEST, ABDOMEN, AND PELVIS WITH CONTRAST
TECHNIQUE: Multidetector CT imaging of the chest, abdomen and pelvis was
performed following the standard protocol during bolus
administration of intravenous contrast.
CONTRAST:  100mL OMNIPAQUE IOHEXOL 300 MG/ML  SOLN

[Series 2: axials cap 5.00 · axial · 0.69mm/px · z∈[-1539,-979]mm · 9 of 141 slices shown, 11 images]
[im 15/141  mediastinal]
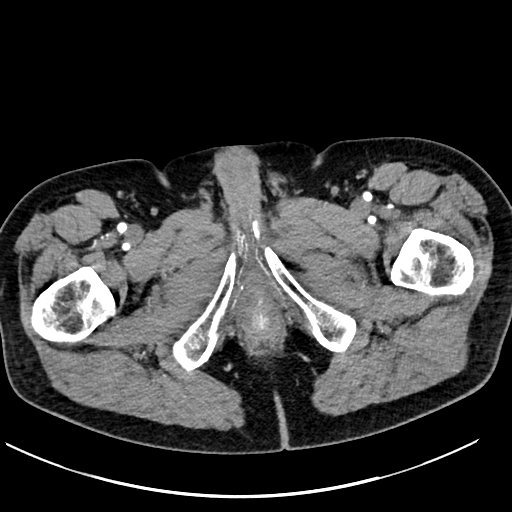
[im 15/141  bone]
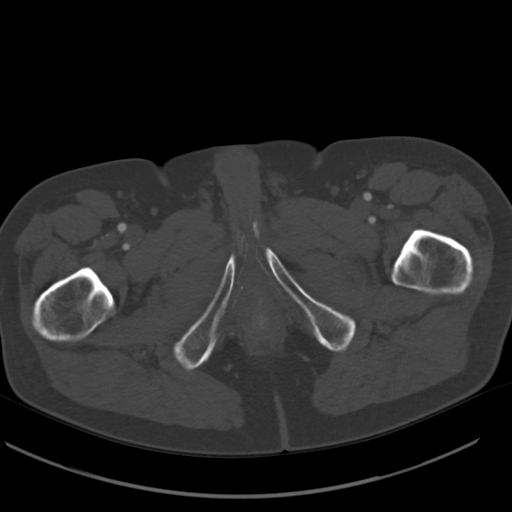
[im 29/141  mediastinal]
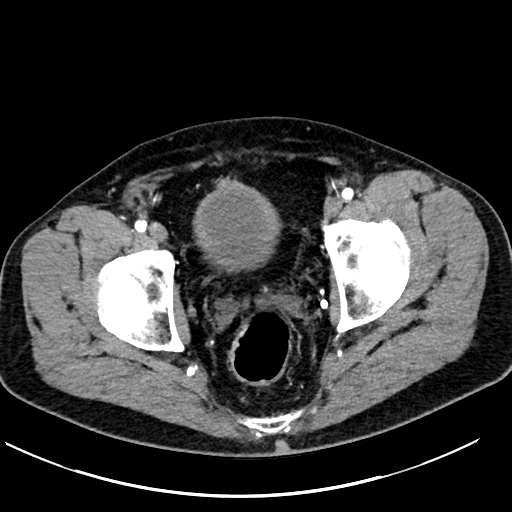
[im 43/141  mediastinal]
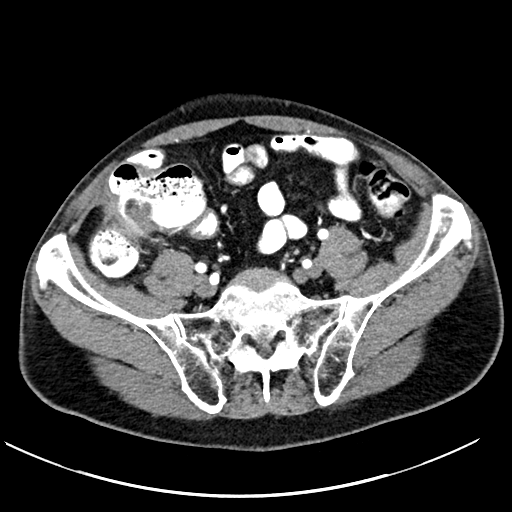
[im 57/141  mediastinal]
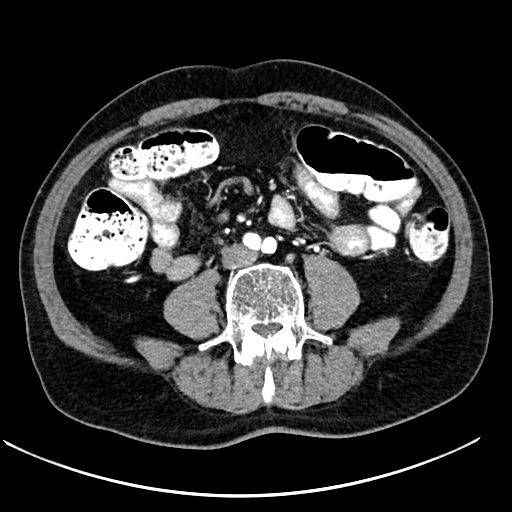
[im 71/141  mediastinal]
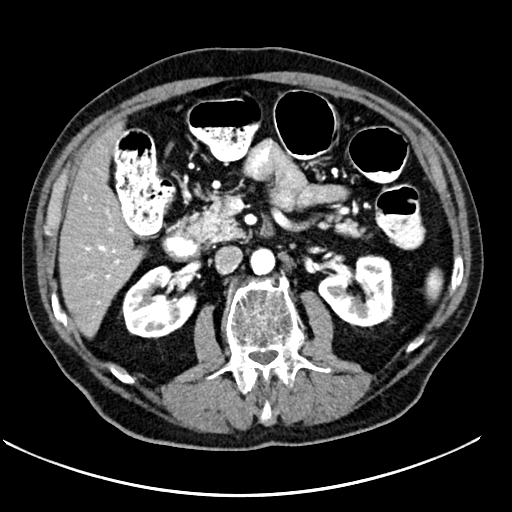
[im 85/141  mediastinal]
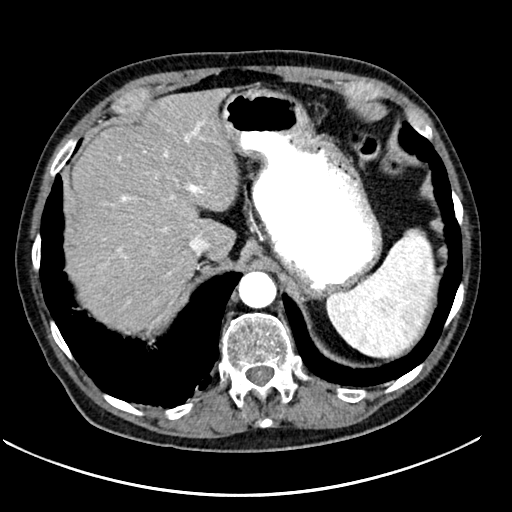
[im 99/141  mediastinal]
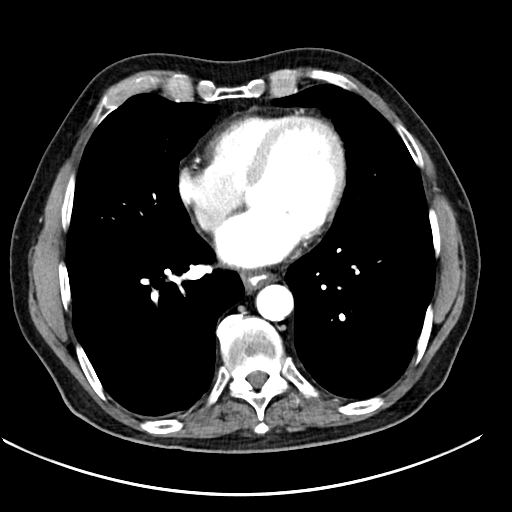
[im 113/141  mediastinal]
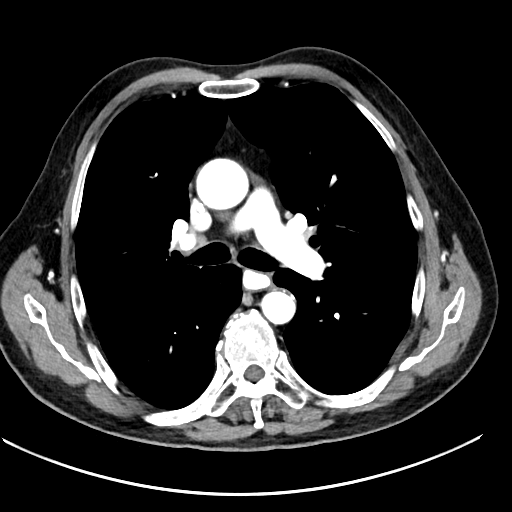
[im 127/141  mediastinal]
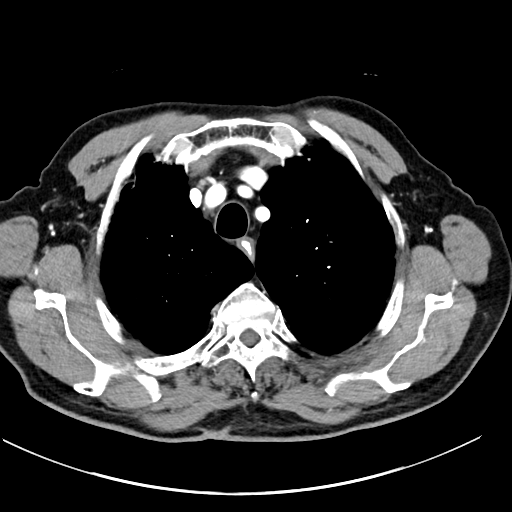
[im 127/141  bone]
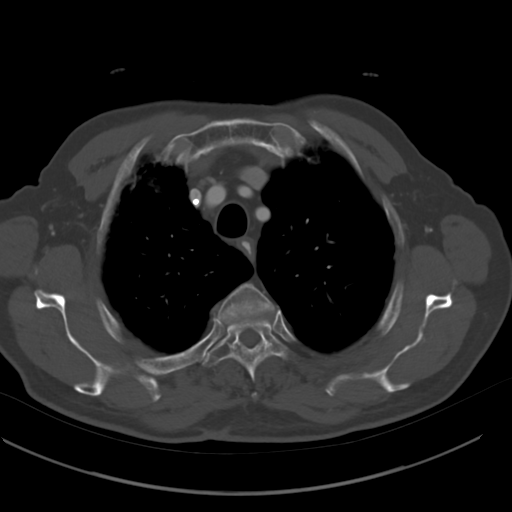

[Series 4: coronals cap 2.00 cor · coronal · 0.69mm/px · 3 of 146 slices shown]
[im 30/146  mediastinal]
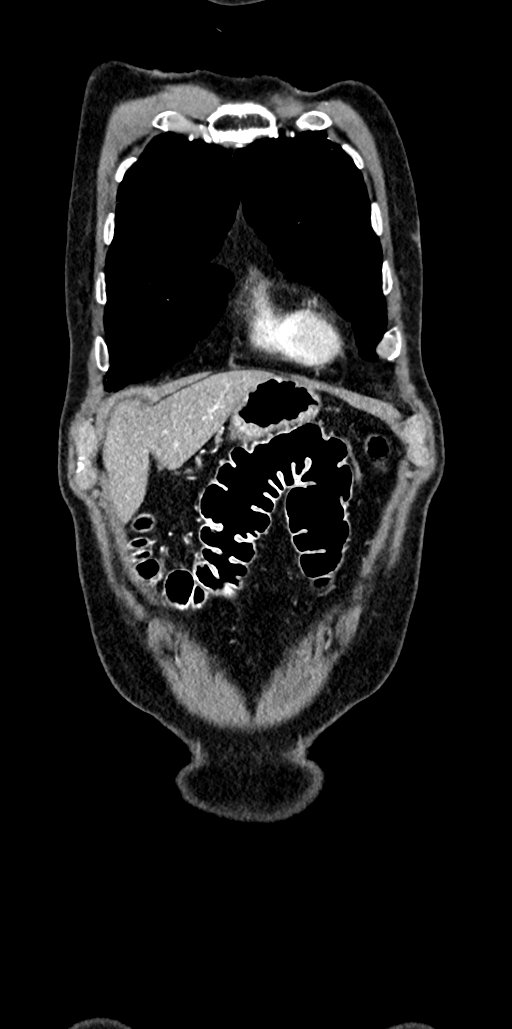
[im 59/146  mediastinal]
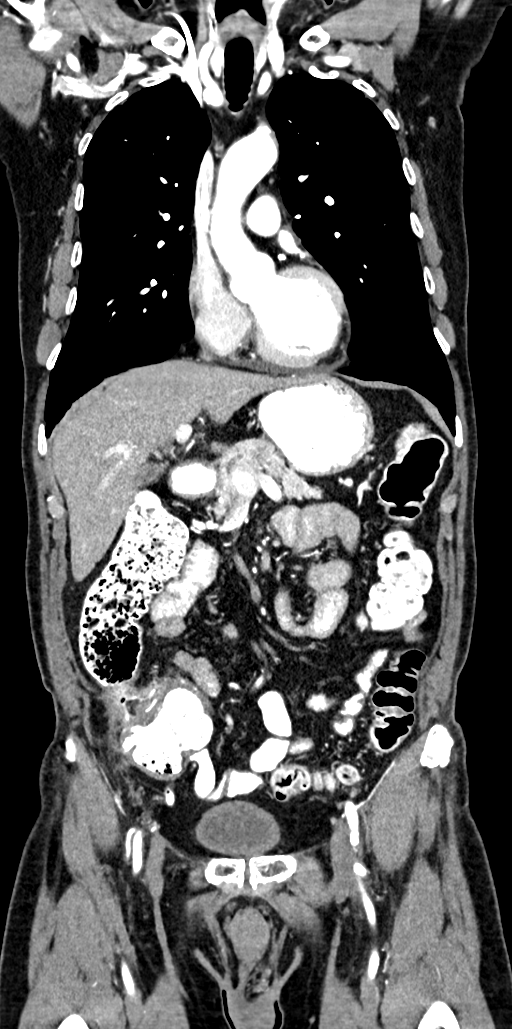
[im 88/146  mediastinal]
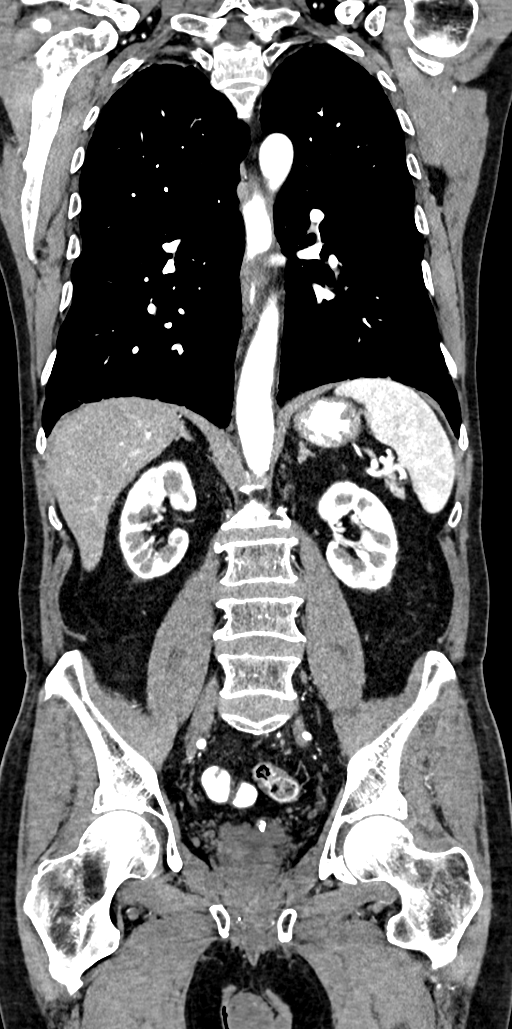

[12 of 36 positions shown; findings below may reference images not displayed]

FINDINGS: CT CHEST FINDINGS

Cardiovascular: No acute findings. Aortic and coronary
atherosclerotic calcification noted.

Mediastinum/Lymph Nodes: No masses or pathologically enlarged lymph
nodes identified. Tiny hiatal hernia is seen, with mild diffuse
esophageal wall thickening, consistent with esophagitis.

Lungs/Pleura: Moderate centrilobular emphysema again demonstrated.
Small calcified granuloma in the lateral right midlung remain stable
as well as other tiny sub-cm nodules in the right anterior mid lung.
No suspicious pulmonary nodules or masses identified. No evidence of
infiltrate or pleural effusion.

Musculoskeletal:  No suspicious bone lesions identified.

CT ABDOMEN AND PELVIS FINDINGS

Hepatobiliary: No masses identified. Gallbladder is unremarkable. No
evidence of biliary ductal dilatation.

Pancreas:  No mass or inflammatory changes.

Spleen:  Within normal limits in size and appearance.

Adrenals/Urinary tract: Subcapsular cyst seen in anterior lower pole
of right kidney measuring 1.3 cm, and is stable. Another small cyst
is seen in posterior midpole of right kidney. No masses or
hydronephrosis.

Stomach/Bowel: Tiny hiatal hernia noted. No evidence of obstruction,
inflammatory process, or abnormal fluid collections. Normal appendix
visualized.

Vascular/Lymphatic: No pathologically enlarged lymph nodes
identified. No acute vascular findings. Aortic atherosclerotic
calcification noted.

Reproductive:  No mass or other significant abnormality identified.

Other: A soft tissue nodule in the lower abdominal omental fat near
the midline measures 1.5 x 1.2 cm on image 82/2, compared to 2.9 x
2.3 cm on prior study. No other new or increased masses are
identified.

Musculoskeletal:  No suspicious bone lesions identified.
IMPRESSION: Decreased size of abdominal omental soft tissue nodule since prior
exam.

No new or progressive metastatic disease within the chest, abdomen,
or pelvis.

Tiny hiatal hernia and findings of esophagitis.

Aortic Atherosclerosis (NC72F-WH7.7) and Emphysema ([id number].E).
